# Patient Record
Sex: Male | Born: 1960
Health system: Southern US, Community
[De-identification: ages and names within clinical notes are randomized; demographics above are authoritative.]

## PROBLEM LIST (undated history)

## (undated) DIAGNOSIS — F329 Major depressive disorder, single episode, unspecified: Secondary | ICD-10-CM

## (undated) DIAGNOSIS — K559 Vascular disorder of intestine, unspecified: Secondary | ICD-10-CM

## (undated) DIAGNOSIS — F32A Depression, unspecified: Secondary | ICD-10-CM

## (undated) DIAGNOSIS — R000 Tachycardia, unspecified: Secondary | ICD-10-CM

## (undated) DIAGNOSIS — I10 Essential (primary) hypertension: Secondary | ICD-10-CM

## (undated) DIAGNOSIS — E785 Hyperlipidemia, unspecified: Secondary | ICD-10-CM

## (undated) DIAGNOSIS — K219 Gastro-esophageal reflux disease without esophagitis: Secondary | ICD-10-CM

## (undated) DIAGNOSIS — E119 Type 2 diabetes mellitus without complications: Secondary | ICD-10-CM

## (undated) HISTORY — DX: Tachycardia, unspecified: R00.0

## (undated) HISTORY — DX: Vascular disorder of intestine, unspecified: K55.9

## (undated) HISTORY — PX: NASAL SEPTUM SURGERY: SHX37

## (undated) HISTORY — DX: Hyperlipidemia, unspecified: E78.5

## (undated) HISTORY — DX: Depression, unspecified: F32.A

## (undated) HISTORY — DX: Type 2 diabetes mellitus without complications: E11.9

## (undated) HISTORY — DX: Essential (primary) hypertension: I10

## (undated) HISTORY — DX: Major depressive disorder, single episode, unspecified: F32.9

---

## 1992-07-31 DIAGNOSIS — E1142 Type 2 diabetes mellitus with diabetic polyneuropathy: Secondary | ICD-10-CM | POA: Insufficient documentation

## 1992-07-31 DIAGNOSIS — E119 Type 2 diabetes mellitus without complications: Secondary | ICD-10-CM | POA: Insufficient documentation

## 2000-01-11 ENCOUNTER — Emergency Department (HOSPITAL_COMMUNITY): Admission: EM | Admit: 2000-01-11 | Discharge: 2000-01-12 | Payer: Self-pay | Admitting: *Deleted

## 2002-02-07 ENCOUNTER — Encounter: Admission: RE | Admit: 2002-02-07 | Discharge: 2002-05-08 | Payer: Self-pay | Admitting: Family Medicine

## 2004-02-05 ENCOUNTER — Encounter: Admission: RE | Admit: 2004-02-05 | Discharge: 2004-05-05 | Payer: Self-pay | Admitting: Family Medicine

## 2004-11-18 ENCOUNTER — Ambulatory Visit (HOSPITAL_COMMUNITY): Admission: RE | Admit: 2004-11-18 | Discharge: 2004-11-18 | Payer: Self-pay | Admitting: Urology

## 2004-11-18 ENCOUNTER — Ambulatory Visit (HOSPITAL_BASED_OUTPATIENT_CLINIC_OR_DEPARTMENT_OTHER): Admission: RE | Admit: 2004-11-18 | Discharge: 2004-11-18 | Payer: Self-pay | Admitting: Urology

## 2008-11-27 ENCOUNTER — Ambulatory Visit: Payer: Self-pay | Admitting: Oncology

## 2011-05-08 NOTE — Op Note (Signed)
NAME:  Jack Wallace, Jack Wallace NO.:  [id number]   MEDICAL RECORD NO.:  [id number]          PATIENT TYPE:  AMB   LOCATION:  [facility]                         FACILITY:  [facility]   PHYSICIAN:  Jamison Neighbor, M.D.  DATE OF BIRTH:  1961/06/07   DATE OF PROCEDURE:  11/10/2004  DATE OF DISCHARGE:                                 OPERATIVE REPORT   PREOPERATIVE DIAGNOSIS:  Right ureteral calculus.   POSTOPERATIVE DIAGNOSES:  1.  Passed ureteral calculus.  2.  Large right pelvic calcification outside ureter.   PROCEDURE:  1.  Cystoscopy.  2.  Right ureteroscopy.   SURGEON:  Jamison Neighbor, M.D.   ANESTHESIA:  General.   COMPLICATIONS:  None.   DRAINS:  None.   BRIEF HISTORY:  This 50 year old male developed right-sided pain and was  seen at [facility].  CT scan was obtained which showed what was described as  a 3 mm stone in the mid ureter along with some hydronephrosis.  The patient  presented to my office, and a KUB did not demonstrate a 3 mm stone but did  demonstrate what appeared to be a 15 mm calcification in the general area of  the right distal ureter.  The patient had less pain at that time but since  he had clearly not passed the stone, we felt it was appropriate to give him  a chance to do so.  The patient was given Uroxatral to dilate the ureter.  He returned in follow up and had not yet passed the stone.  He elected to go  ahead and undergo ureteroscopy to determine if the stone was still present.  The patient understands the risks and benefits of the procedure.  He is  aware of the discrepancy between the CT scan and the KUB and knows that this  calcification in the distal right ureter may or may not be within the ureter  proper.  He gave full informed consent.   DESCRIPTION OF PROCEDURE:  After successful induction of general anesthesia,  the patient was placed in the dorsal lithotomy position, prepped with  Betadine and draped in the usual sterile fashion.   Cystoscopy was performed;  the urethra was visualized in its entirety and was found to be normal.  Beyond the verumontanum, the patient had very little prostatic enlargement.  The bladder neck was wide open.  The bladder was then carefully inspected.  It was free of any tumor or stones.  Both ureteral orifices were normal in  configuration and location.  A guidewire was passed up to the right kidney,  and this was visualized by fluoroscopy.  A ureteroscope was then advanced  along the ureter and was advanced approximately two-thirds of the way to the  kidney.  This was certainly well beyond the level where the stone was seen  on a CT scan, and no stone was ever visualized.  The distal ureter was  easily seen and inspected, and no stone could be seen.  Fluoroscopy  demonstrated a large calcification in the pelvis adjacent to the ureter and  clearly separate from the  ureter, indicating that the calcification seen on  KUB was not a stone but simply a phlebolith or other pelvic calcification.  The ureteroscopy was very atraumatic, and it was  felt that there was no need to place a stent.  The guidewire was removed.  The bladder was drained.  The patient tolerated the procedure well and was  taken to the recovery room in good condition.  He will be sent home with  Lorcet Plus as well as __________ and will return to see [place] in follow up.      RJE/MEDQ  D:  11/18/2004  T:  11/18/2004  Job:  [id number]   cc:   [facility]

## 2013-07-31 ENCOUNTER — Other Ambulatory Visit: Payer: Self-pay | Admitting: *Deleted

## 2013-07-31 ENCOUNTER — Encounter: Payer: Self-pay | Admitting: Endocrinology

## 2013-07-31 ENCOUNTER — Ambulatory Visit (INDEPENDENT_AMBULATORY_CARE_PROVIDER_SITE_OTHER): Payer: BC Managed Care – PPO | Admitting: Endocrinology

## 2013-07-31 VITALS — BP 118/80 | HR 93 | Temp 98.8°F | Resp 12 | Ht 73.0 in | Wt 296.4 lb

## 2013-07-31 DIAGNOSIS — I152 Hypertension secondary to endocrine disorders: Secondary | ICD-10-CM | POA: Insufficient documentation

## 2013-07-31 DIAGNOSIS — E1149 Type 2 diabetes mellitus with other diabetic neurological complication: Secondary | ICD-10-CM

## 2013-07-31 DIAGNOSIS — E7800 Pure hypercholesterolemia, unspecified: Secondary | ICD-10-CM

## 2013-07-31 DIAGNOSIS — E785 Hyperlipidemia, unspecified: Secondary | ICD-10-CM | POA: Insufficient documentation

## 2013-07-31 DIAGNOSIS — IMO0001 Reserved for inherently not codable concepts without codable children: Secondary | ICD-10-CM

## 2013-07-31 DIAGNOSIS — E1140 Type 2 diabetes mellitus with diabetic neuropathy, unspecified: Secondary | ICD-10-CM

## 2013-07-31 DIAGNOSIS — E1159 Type 2 diabetes mellitus with other circulatory complications: Secondary | ICD-10-CM | POA: Insufficient documentation

## 2013-07-31 DIAGNOSIS — E1142 Type 2 diabetes mellitus with diabetic polyneuropathy: Secondary | ICD-10-CM

## 2013-07-31 DIAGNOSIS — I10 Essential (primary) hypertension: Secondary | ICD-10-CM

## 2013-07-31 MED ORDER — GABAPENTIN 300 MG PO CAPS: 300.0000 mg | ORAL_CAPSULE | ORAL | Status: DC | PRN

## 2013-07-31 MED ORDER — CANAGLIFLOZIN 100 MG PO TABS: 100.0000 mg | ORAL_TABLET | Freq: Every day | ORAL | Status: DC

## 2013-07-31 NOTE — Progress Notes (Signed)
Patient ID: Jack Wallace, male   DOB: 03-16-1961, 52 y.o.   MRN: [id number]    Reason for Appointment : Consultation for Type 2 Diabetes  History of Present Illness          Diagnosis: Type 2 diabetes mellitus, date of diagnosis: 1993        Past history: He has been treated with various drugs for his diabetes over the last several years. He has been taking metformin for at least 10 years and has been tolerating this. Over the years he has had additional medications to improve his control. He was also taking Actos at some point and not clear if he had any side effects. This was stopped because of fear of long-term effects The last 5 years he has been on glipizide also. Previously was taking 10 mg twice a day. He thinks that a couple of years ago his blood sugars were poorly controlled with readings up to 300  Recent history: About 6 months ago he started walking with more lost 15 pounds. Subsequently about 2-4 months ago he has had periods of low blood sugar during the day especially before lunch and sometimes in the afternoon His glipizide has been reduced to 5 mg twice a day but he still has had low blood sugar episodes  More recently his glipizide was changed to half tablet at breakfast and lunch and one tablet at supper  His low blood sugar episodes have been less recently but he is not referred him for further management  The diet that the patient has been following [id number] to limit carbohydrates. Meals: 3 meals per day.  usually eating a frozen meal at breakfast and supper and only crackers at lunch. Will have more snacks and evenings including chips and cheese crackers        Dietician visit: Most recent:1993-95   Monitors blood glucose: Twice a day.         Glucometer: ? One Touch.      Blood Glucose readings from meter download: readings before breakfast: 130-140, nonfasting  100-150    Hypoglycemia frequency:  recently minimal but previously was having more frequent symptoms as  above.  significant hypoglycemia he has symptoms of feeling lightheaded, having tunnel vision and feeling weak. He gets excessively hungry with low sugars           Physical activity: exercise:  walking to work and do shopping areas             His last A1c was 6.9% done on 07/18/13  No results found for this basename: HGBA1C   Filed Weights   07/31/13 1338  Weight: 296 lb 6.4 oz (134.446 kg)    Retinal exam: Most recent:. 7/13    Medication List       This list is accurate as of: 07/31/13  1:55 PM.  Always use your most recent med list.               glipiZIDE 5 MG tablet  Commonly known as:  GLUCOTROL  Take 5 mg by mouth 2 (two) times daily before a meal.     lisinopril-hydrochlorothiazide 20-25 MG per tablet  Commonly known as:  PRINZIDE,ZESTORETIC  Take 1 tablet by mouth daily.     metFORMIN 500 MG tablet  Commonly known as:  GLUCOPHAGE  Take 500 mg by mouth 2 (two) times daily with a meal.     pravastatin 40 MG tablet  Commonly known as:  PRAVACHOL  Take 40  mg by mouth daily.        Allergies: No Known Allergies  Past Medical History  Diagnosis Date  . Diabetes mellitus without complication   . Hypertension   . Hyperlipidemia     Past Surgical History  Procedure Laterality Date  . Nasal septum surgery      Family History  Problem Relation Age of Onset  . Hypertension Mother   . Diabetes Maternal Grandfather     Social History:  reports that he has never smoked. He has never used smokeless tobacco. His alcohol and drug histories are not on file.    Review of Systems      Hyperlipidemia: Currently taking pravastatin, no recent labs available     Skin: No rash or infections     Thyroid:  No cold or heat intolerance, unusual fatigue.     The blood pressure has been controlled with Zestoretic. He thinks that his readings at home may be as low as 80/60 at times but other times about 120/70. Only minimal symptoms of lightheadedness     No  swelling of feet.     No shortness of breath on exertion.     Bowel habits:  Has had constipation at times . No abnormal pain      No frequency of urination, nocturia.      No joint  Pains.      He complains of his right foot burning for the last 2 years and this is occurring at various times including while working and with certain shoes, only mild transient at night. Has not been treated for this      No  depression or anxiety.    Physical Examination:  BP 118/80  Pulse 93  Temp(Src) 98.8 F (37.1 C)  Resp 12  Ht 6\' 1"  (1.854 m)  Wt 296 lb 6.4 oz (134.446 kg)  BMI 39.11 kg/m2  SpO2 98%  GENERAL:         Patient appears to have generalized obesity.   HEENT:         Eye exam shows normal external appearance. Fundus exam shows no retinopathy. Oral exam shows normal mucosa .  NECK:         General:  Neck exam shows no lymphadenopathy. Carotids are normal to palpation and no bruit heard. Thyroid is not enlarged and no nodules felt.   LUNGS:         Chest is symmetrical. Lungs are clear to auscultation.Marland Kitchen   HEART:         Heart sounds:  S1 and S2 are normal. No murmurs or clicks heard., no S3 or S4.   ABDOMEN:         General:  There is no distention present. Liver and spleen are not palpable. No other mass or tenderness present.  EXTREMITIES:     There is no edema. No skin lesions present.Marland Kitchen  NEUROLOGICAL:        Vibration sense is markedly reduced in toes bilaterally. Ankle jerks are absent bilaterally.         Diabetic foot exam:            Inspection  Normal                    Monofilament  Normal  MUSCULOSKELETAL:       There is no enlargement or deformity of the joints. Spine is normal to inspection.Marland Kitchen   PEDAL pulses: Normal SKIN:  No rash or lesions of concern.        ASSESSMENT/PLAN:   Diabetes type 2, uncontrolled - 250.02    His blood sugars are overall reasonably well controlled but he is having a tendency to hypoglycemia with glipizide This is likely to be  because of is increasing his activity level and losing weight earlier this year Currently is having difficulty with losing weight and not able to control portions and snacks He is also taking metformin 2000 mg a day and tolerating this well, no contraindications for him taking this.  Discussed options for alternative treatment especially to benefit his obesity. Explained that we could either try a GLP-1 drug or SGLT.-2 drug and explained how these work. He is reluctant to try injectable drug and prefers to try an oral medication He will start with 100 mg of Invokana and taper off his glipizide, instructions given Discussed how Invokana works, medication dosage, possible side effects and efficacy He will also be seen by a diabetes nurse educator and dietitian for further education  Complications: He appears to have symptoms of neuropathy and would like to have her medication to control symptoms we will try him on gabapentin 3 times a day as needed  HYPERLIPIDEMIA: Recent labs are available and this will need to be reassessed, currently on pravastatin  Hilman Kissling 07/31/2013, 1:55 PM

## 2013-07-31 NOTE — Patient Instructions (Addendum)
Please check blood sugars at least half the time about 2 hours after any meal and as directed on waking up. Please bring blood sugar monitor to each visit  Stop morning glipizide and take only half a tablet before dinner for the first 5 days on the new medication  Start INVOKANA 100 mg daily in the morning. CONTINUE metformin 2 tablets twice a day  Reduce lisinopril HCT to half tablet  Gabapentin 300 mg as needed up to 3 times a day for burning in the feet, preferably take this with food, may cause nausea or dizziness  Continue walking as much as possible and watch portions and fat intake

## 2013-08-01 DIAGNOSIS — E1140 Type 2 diabetes mellitus with diabetic neuropathy, unspecified: Secondary | ICD-10-CM | POA: Insufficient documentation

## 2013-08-25 ENCOUNTER — Encounter: Payer: Self-pay | Admitting: Endocrinology

## 2013-08-25 ENCOUNTER — Ambulatory Visit (INDEPENDENT_AMBULATORY_CARE_PROVIDER_SITE_OTHER): Payer: BC Managed Care – PPO | Admitting: Endocrinology

## 2013-08-25 VITALS — BP 104/70 | HR 83 | Temp 98.3°F | Resp 12 | Ht 73.0 in | Wt 295.3 lb

## 2013-08-25 DIAGNOSIS — E1149 Type 2 diabetes mellitus with other diabetic neurological complication: Secondary | ICD-10-CM

## 2013-08-25 DIAGNOSIS — IMO0001 Reserved for inherently not codable concepts without codable children: Secondary | ICD-10-CM

## 2013-08-25 DIAGNOSIS — E1140 Type 2 diabetes mellitus with diabetic neuropathy, unspecified: Secondary | ICD-10-CM

## 2013-08-25 DIAGNOSIS — E1142 Type 2 diabetes mellitus with diabetic polyneuropathy: Secondary | ICD-10-CM

## 2013-08-25 DIAGNOSIS — I10 Essential (primary) hypertension: Secondary | ICD-10-CM

## 2013-08-25 MED ORDER — GABAPENTIN 100 MG PO CAPS: 100.0000 mg | ORAL_CAPSULE | Freq: Three times a day (TID) | ORAL | Status: DC

## 2013-08-25 NOTE — Progress Notes (Signed)
Patient ID: Jack Wallace, male   DOB: Feb 03, 1961, 52 y.o.   MRN: [id number]    Reason for Appointment : Consultation for Type 2 Diabetes  History of Present Illness          Diagnosis: Type 2 diabetes mellitus, date of diagnosis: 1993        Past history: He has been treated with various drugs for his diabetes over the last several years. He has been taking metformin for at least 10 years and has been tolerating this. Over the years he has had additional medications to improve his control. He was also taking Actos at some point and not clear if he had any side effects. This was stopped because of fear of long-term effects The last 5 years he has been on glipizide also. Previously was taking 10 mg twice a day. He thinks that a couple of years ago his blood sugars were poorly controlled with readings up to 300 About 6 months ago he started walking with more lost 15 pounds. Subsequently about 2-4 months ago he has had periods of low blood sugar during the day especially before lunch and sometimes in the afternoon His glipizide was been reduced to 5 mg twice a day but he still  had low blood sugar episodes   Recent history:  He was told to switch to Invokana instead of a glipizide on his initial consultation but he started getting constipation about a week into the medication and he stopped the Invokana on his own and did not notify us. His blood sugars have been checked very sporadically and has variable readings. No readings after lunch or supper  The diet that the patient has been following QI:ONGEX to limit carbohydrates. Meals: 3 meals per day.  usually eating a frozen meal at breakfast and supper and only crackers at lunch. Will have more snacks and evenings including chips and cheese crackers        Dietician visit: Most recent:1993-95   Monitors blood glucose:  sporadically        Glucometer:  FreeStyle     Blood Glucose readings from meter download: Early morning 168, around 10 AM 73,  189-136, 1 PM = 89   Hypoglycemia frequency:  recently minimal           Physical activity: exercise:  walking to work and do shopping areas             His last A1c was 6.9% done on 07/18/13 and 7.3 on 08/17/13  No results found for this basename: HGBA1C   Filed Weights   08/25/13 1526  Weight: 295 lb 4.8 oz (133.947 kg)    Retinal exam: Most recent:. 7/13    Medication List       This list is accurate as of: 08/25/13  3:33 PM.  Always use your most recent med list.               Canagliflozin 100 MG Tabs  Commonly known as:  INVOKANA  Take 1 tablet (100 mg total) by mouth daily.     gabapentin 300 MG capsule  Commonly known as:  NEURONTIN  Take 1 capsule (300 mg total) by mouth as needed.     glipiZIDE 5 MG tablet  Commonly known as:  GLUCOTROL  Take 5 mg by mouth 2 (two) times daily before a meal.     lisinopril-hydrochlorothiazide 20-25 MG per tablet  Commonly known as:  PRINZIDE,ZESTORETIC  Take 1 tablet by mouth daily.  metFORMIN 500 MG tablet  Commonly known as:  GLUCOPHAGE  Take 500 mg by mouth 2 (two) times daily with a meal.     pravastatin 40 MG tablet  Commonly known as:  PRAVACHOL  Take 40 mg by mouth daily.        Allergies: No Known Allergies  Past Medical History  Diagnosis Date  . Diabetes mellitus without complication   . Hypertension   . Hyperlipidemia     Past Surgical History  Procedure Laterality Date  . Nasal septum surgery      Family History  Problem Relation Age of Onset  . Hypertension Mother   . Diabetes Maternal Grandfather     Social History:  reports that he has never smoked. He has never used smokeless tobacco. His alcohol and drug histories are not on file.    Review of Systems      Hyperlipidemia: Currently taking pravastatin     The blood pressure has been controlled with Zestoretic. He thinks that his readings at home may be as low as 80/60 at times but other times about 120/70. Only minimal symptoms  of lightheadedness      He complains of his right foot burning for the last 2 years and this is occurring at various times including while working and with certain shoes, only mild transient at night. Has not been treated for this    Physical Examination:  BP 104/70  Pulse 83  Temp(Src) 98.3 F (36.8 C)  Resp 12  Ht 6\' 1"  (1.854 m)  Wt 295 lb 4.8 oz (133.947 kg)  BMI 38.97 kg/m2  SpO2 97%  GENERAL:         Patient appears to have generalized obesity.    ASSESSMENT/PLAN:   Diabetes type 2, uncontrolled - 250.02    He has had difficulty taking glipizide because of tendency to hypoglycemia Although he did try his description of Invokana he stopped this because of having constipation Discussed that this is not a usual side effect of Invokana and he is willing to try this again. We'll stop glipizide since recently has had low normal blood sugars  Currently is having difficulty with losing weight and not able to control portions and snacks He is also taking metformin 2000 mg a day and tolerating this well, no contraindications for him taking this.  He will also be seen by a diabetes nurse educator and dietitian for further education  Complications: He appears to have symptoms of neuropathy and would like to have her medication to control symptoms we will try him on gabapentin 3 times a day as needed  HYPERLIPIDEMIA: Recent labs are not available and this will need to be reassessed, currently on pravastatin  Jack Wallace 08/25/2013, 3:33 PM   Addendum: Labs on 08/17/13 done at work. Lipids show LDL 77 with triglycerides 409 and HDL 38

## 2013-08-25 NOTE — Patient Instructions (Addendum)
Please check blood sugars at least half the time about 2 hours after any meal and as directed on waking up. Please bring blood sugar monitor to each visit  Stop Glipizide  Stay on Metformin  Gabapentin 100mg  2x daily and 300 mg bedtime  Invokana in am next week onwards

## 2013-09-22 ENCOUNTER — Other Ambulatory Visit: Payer: Self-pay | Admitting: *Deleted

## 2013-09-22 ENCOUNTER — Encounter: Payer: Self-pay | Admitting: Endocrinology

## 2013-09-22 ENCOUNTER — Other Ambulatory Visit: Payer: BC Managed Care – PPO

## 2013-09-22 ENCOUNTER — Ambulatory Visit (INDEPENDENT_AMBULATORY_CARE_PROVIDER_SITE_OTHER): Payer: BC Managed Care – PPO | Admitting: Endocrinology

## 2013-09-22 VITALS — BP 118/60 | HR 104 | Temp 98.5°F | Resp 12 | Ht 73.0 in | Wt 287.9 lb

## 2013-09-22 DIAGNOSIS — E785 Hyperlipidemia, unspecified: Secondary | ICD-10-CM

## 2013-09-22 DIAGNOSIS — IMO0001 Reserved for inherently not codable concepts without codable children: Secondary | ICD-10-CM

## 2013-09-22 MED ORDER — ONETOUCH DELICA LANCETS FINE MISC: 1.0000 | Freq: Two times a day (BID) | Status: DC

## 2013-09-22 MED ORDER — GLUCOSE BLOOD VI STRP: ORAL_STRIP | Status: DC

## 2013-09-22 NOTE — Progress Notes (Signed)
Patient ID: Jack Wallace, male   DOB: Dec 02, 1961, 52 y.o.   MRN: [id number]    Reason for Appointment : Consultation for Type 2 Diabetes  History of Present Illness          Diagnosis: Type 2 diabetes mellitus, date of diagnosis: 1993         Past history: He has been treated with various drugs for his diabetes over the last several years. He has been taking metformin for at least 10 years and has been tolerating this. Over the years he has had additional medications to improve his control. He was also taking Actos at some point and not clear if he had any side effects. This was stopped because of fear of long-term effects The last 5 years he was on glipizide also. Previously was taking 10 mg twice a day. About 2 years ago his blood sugars were poorly controlled with readings up to 300 In early 2014 he started walking and 15 pounds. Subsequently he was getting low blood sugar during the day especially before lunch and sometimes in the afternoon His glipizide was  reduced but even with 5 mg twice a day he was getting hypoglycemia  Recent history:  He has resumed Invokana which he previously tried only for a week and felt he had constipation with this. However he has no such side effects now and taking this along with metformin maximum doses. His blood sugars are difficult to assess as they are inconsistent and high only on 2 separate days No readings after lunch or supper  The diet that the patient has been following WU:[id number] to limit carbohydrates. Meals: 2 meals per day.  usually eating a frozen meal at breakfast and supper and only crackers at lunch. Will have more snacks and evenings including chips and crackers        Dietician visit: Most recent:1993-95   Monitors blood glucose:  sporadically        Glucometer:  FreeStyle     Blood Glucose readings from meter download: Early morning 242, 180, midday 112-228, has no readings after 1 PM and most readings late morning. Overall averaging 160   Hypoglycemia frequency:  recently none           Physical activity: exercise: No specific exercise, just walking to work and to the shopping areas             His last A1c was 6.9% done on 07/18/13 and 7.3 on 08/17/13, done from outside lab  No results found for this basename: HGBA1C   Filed Weights   09/22/13 1558  Weight: 287 lb 14.4 oz (130.591 kg)    Retinal exam: Most recent:. 7/13  HYPERLIPIDEMIA: He is currently taking pravastatin only with good control of LDL but persistently high triglycerides and low HDL.Lipids show LDL 77 with triglycerides 147 and HDL 38 in August    Medication List       This list is accurate as of: 09/22/13 11:59 PM.  Always use your most recent med list.               Canagliflozin 100 MG Tabs  Commonly known as:  INVOKANA  Take 1 tablet (100 mg total) by mouth daily.     gabapentin 100 MG capsule  Commonly known as:  NEURONTIN  Take 1 capsule (100 mg total) by mouth 3 (three) times daily.     glipiZIDE 5 MG tablet  Commonly known as:  GLUCOTROL  Take 5 mg by  mouth 2 (two) times daily before a meal.     glucose blood test strip  Commonly known as:  ONETOUCH VERIO  Use as instructed to check blood sugars 2 times a day     lisinopril-hydrochlorothiazide 20-25 MG per tablet  Commonly known as:  PRINZIDE,ZESTORETIC  Take 1 tablet by mouth daily. 1/2     metFORMIN 500 MG tablet  Commonly known as:  GLUCOPHAGE  Take 500 mg by mouth 2 (two) times daily with a meal. 2 tabs twice dialy     ONETOUCH DELICA LANCETS FINE Misc  1 each by Does not apply route 2 (two) times daily.     pravastatin 40 MG tablet  Commonly known as:  PRAVACHOL  Take 40 mg by mouth daily.        Allergies: No Known Allergies  Past Medical History  Diagnosis Date  . Diabetes mellitus without complication   . Hypertension   . Hyperlipidemia     Past Surgical History  Procedure Laterality Date  . Nasal septum surgery      Family History  Problem  Relation Age of Onset  . Hypertension Mother   . Diabetes Maternal Grandfather     Social History:  reports that he has never smoked. He has never used smokeless tobacco. His alcohol and drug histories are not on file.    Review of Systems      Hyperlipidemia: Currently taking pravastatin only for several years     The blood pressure has been controlled with Zestoretic. He thinks that his readings at home are about 130. His respiratory was reduced to half tablet with starting Invokana on the last visit      He complains of his right foot burning for the last 2 years and this is occurring at various times including while working and with certain shoes, only mild transient at night. Has not been treated for this   Physical Examination:  BP 118/60  Pulse 104  Temp(Src) 98.5 F (36.9 C)  Resp 12  Ht 6\' 1"  (1.854 m)  Wt 287 lb 14.4 oz (130.591 kg)  BMI 37.99 kg/m2  SpO2 96%  GENERAL:         Patient appears to have generalized obesity.   No ankle edema  ASSESSMENT/PLAN:   Diabetes type 2, uncontrolled - 250.02    He has had difficulty taking glipizide because of tendency to hypoglycemia and blood sugars were high on 2000 mg metformin alone. Is back on Invokana since his last visit and difficult his control as his readings are sporadically higher and not sure if his meter is accurate. Since some of his readings are fairly good will try to increase Invokana and not add  glipizide as yet. He needs to check more readings after supper and consider adding half tablet glipizide before supper if these are high Will check his fructosamine and A1c on next visit to help assess improvement Again is having difficulty with losing weight but is trying to control portions He will also be seen by a diabetes nurse educator and dietitian for further education Today he was shown how to use the Verio glucose monitor as his current monitor is inconsistent and the steps will be better covered by his  insurance  Complications: He has neuropathy and he will continue 100 mg gabapentin 3 times a day as needed which is helping except occasionally when he is driving, may take extra 952 mg as needed in addition since he has no side effects  now  HYPERLIPIDEMIA: Lipids show LDL 77 with triglycerides 161 and HDL 38, currently on pravastatin. Will add fenofibrate and reassess fasting lipids on the next visit. Discussed importance of weight loss and exercise  HYPERTENSION: His blood pressure is relatively low even with taking half a tablet of Zestoretic. He will change this to lisinopril 10 mg from the next prescription, he will call when he is running out  [place] 09/24/2013, 7:06 PM

## 2013-09-22 NOTE — Patient Instructions (Addendum)
Please check blood sugars at least half the time about 2 hours after any meal and as directed on waking up.   If sugars higher after supper add 1/2 Glipizide before supper Take Invokana 2 BEFORE breakfast and next Rx will be 300mg   Please bring blood sugar monitor to each visit  Fenofibrate 145 mg daily in pms  May take 2 Gabapentin if needed

## 2013-10-04 ENCOUNTER — Telehealth: Payer: Self-pay | Admitting: *Deleted

## 2013-10-04 NOTE — Telephone Encounter (Signed)
He will need to be started on Victoza injections, please schedule appointment with nurse educator ASAP, in the meantime he can take double the dose of glipizide

## 2013-10-04 NOTE — Telephone Encounter (Signed)
At last visit you put the patient on Invokana, Pt says his sugars are up around 250 after taking it.   He says he's been taking the Glipizide and metformin because he says the Invokana isn't helping. He also says he's been skipping lunch because he's afraid his sugars will go high. Please advise. [phone]

## 2013-10-04 NOTE — Telephone Encounter (Signed)
Needs OV.  

## 2013-10-04 NOTE — Telephone Encounter (Signed)
Left message for patient and at the [facility] center.

## 2013-10-04 NOTE — Telephone Encounter (Signed)
I spoke with Damita and Harriett Sine at the [facility], they both said you sent a referral for weight loss and Education and they tried calling him on several occasions, and he would never return their calls, they said if you just want him to do a Victoza start we could do that here in the office, but if he wants to go to them for services they will also show him how to do the Victoza

## 2013-10-06 ENCOUNTER — Ambulatory Visit (INDEPENDENT_AMBULATORY_CARE_PROVIDER_SITE_OTHER): Payer: BC Managed Care – PPO | Admitting: Endocrinology

## 2013-10-06 ENCOUNTER — Encounter: Payer: Self-pay | Admitting: Endocrinology

## 2013-10-06 ENCOUNTER — Other Ambulatory Visit: Payer: Self-pay | Admitting: *Deleted

## 2013-10-06 VITALS — BP 118/78 | HR 100 | Temp 98.3°F | Resp 12 | Ht 73.0 in | Wt 288.7 lb

## 2013-10-06 DIAGNOSIS — I10 Essential (primary) hypertension: Secondary | ICD-10-CM

## 2013-10-06 DIAGNOSIS — IMO0001 Reserved for inherently not codable concepts without codable children: Secondary | ICD-10-CM

## 2013-10-06 MED ORDER — LIRAGLUTIDE 18 MG/3ML ~~LOC~~ SOPN: 1.2000 mg | PEN_INJECTOR | Freq: Every day | SUBCUTANEOUS | Status: DC

## 2013-10-06 MED ORDER — INSULIN PEN NEEDLE 32G X 5 MM MISC: 1.0000 | Freq: Every day | Status: DC

## 2013-10-06 MED ORDER — GLIPIZIDE 5 MG PO TABS: ORAL_TABLET | ORAL | Status: DC

## 2013-10-06 NOTE — Progress Notes (Signed)
Patient ID: Jack Wallace, male   DOB: Feb 02, 1961, 52 y.o.   MRN: [id number]   Reason for Appointment : Consultation for Type 2 Diabetes  History of Present Illness          Diagnosis: Type 2 diabetes mellitus, date of diagnosis: 1993         Past history: He has been treated with various drugs for his diabetes over the last several years. He has been taking metformin for at least 10 years and has been tolerating this. Over the years he has had additional medications to improve his control. He was also taking Actos at some point and not clear if he had any side effects. This was stopped because of fear of long-term effects The last 5 years he was on glipizide also. Previously was taking 10 mg twice a day. About 2 years ago his blood sugars were poorly controlled with readings up to 300 In early 2014 he started walking and 15 pounds. Subsequently he was getting low blood sugar during the day especially before lunch and sometimes in the afternoon His glipizide was  reduced but even with 5 mg twice a day he was getting hypoglycemia On his last visit he was started back on Invokana and had tolerated this well.  Recent history:  He has had higher blood sugars at home despite taking Invokana and metformin. On his own he started back taking glipizide half tablet twice a day before meals but is still concerned about his blood sugars being high especially in the morning He did have an episode of hypoglycemia last night after supper but had not eaten much during the day and worked longer hours His blood sugars are being checked mostly midday and afternoon Does not appear to be having high readings in the afternoon/ evenings after about 3 PM  The diet that the patient has been following WU:[id number] to limit carbohydrates. Meals: 2 meals per day.  usually eating a frozen meal at breakfast 9 am and supper 6 pm and only crackers at lunch. Will have more snacks and evenings including chips and crackers         Dietician visit: Most recent:1993-95   Monitors blood glucose:  sporadically        Glucometer:  FreeStyle     Blood Glucose readings from meter download: 6 AM = 158, 172, midmorning up to 245, lunch 149-258 and afternoon/evening 54-162 Hypoglycemia frequency:  recently one time           Physical activity: exercise: No specific exercise, just walking to work and to the shopping areas             His last A1c was 6.9% done on 07/18/13 and 7.3 on 08/17/13, done from outside lab  No results found for this basename: HGBA1C   Wt Readings from Last 3 Encounters:  10/06/13 288 lb 11.2 oz (130.953 kg)  09/22/13 287 lb 14.4 oz (130.591 kg)  08/25/13 295 lb 4.8 oz (133.947 kg)    Retinal exam: Most recent:. 7/13    Medication List       This list is accurate as of: 10/06/13  4:01 PM.  Always use your most recent med list.               Canagliflozin 100 MG Tabs  Commonly known as:  INVOKANA  Take 1 tablet (100 mg total) by mouth daily.     gabapentin 100 MG capsule  Commonly known as:  NEURONTIN  Take  1 capsule (100 mg total) by mouth 3 (three) times daily.     glipiZIDE 5 MG tablet  Commonly known as:  GLUCOTROL  Take 5 mg by mouth 2 (two) times daily before a meal.     glucose blood test strip  Commonly known as:  ONETOUCH VERIO  Use as instructed to check blood sugars 2 times a day     lisinopril-hydrochlorothiazide 20-25 MG per tablet  Commonly known as:  PRINZIDE,ZESTORETIC  Take 1 tablet by mouth daily. 1/2     metFORMIN 500 MG tablet  Commonly known as:  GLUCOPHAGE  Take 500 mg by mouth 2 (two) times daily with a meal. 2 tabs twice dialy     ONETOUCH DELICA LANCETS FINE Misc  1 each by Does not apply route 2 (two) times daily.     pravastatin 40 MG tablet  Commonly known as:  PRAVACHOL  Take 40 mg by mouth daily.        Allergies: No Known Allergies  Past Medical History  Diagnosis Date  . Diabetes mellitus without complication   . Hypertension   .  Hyperlipidemia     Past Surgical History  Procedure Laterality Date  . Nasal septum surgery      Family History  Problem Relation Age of Onset  . Hypertension Mother   . Diabetes Maternal Grandfather     Social History:  reports that he has never smoked. He has never used smokeless tobacco. His alcohol and drug histories are not on file.    Review of Systems      Hyperlipidemia: Currently taking pravastatin only for several years .Lipids show LDL 77 with triglycerides 161 and HDL 38 in August      The blood pressure has been controlled with Zestoretic. He thinks that his readings at home are about 130. His lisinopril HCT was reduced to half tablet with starting Invokana   Had complained of right foot burning and was given gabapentin on the last visit  Physical Examination:  BP 118/78  Pulse 100  Temp(Src) 98.3 F (36.8 C)  Resp 12  Ht 6\' 1"  (1.854 m)  Wt 288 lb 11.2 oz (130.953 kg)  BMI 38.1 kg/m2  SpO2 96%  GENERAL:         Patient appears to have generalized obesity.   No ankle edema  ASSESSMENT/PLAN:   Diabetes type 2, uncontrolled - 250.02    He has had difficulty controlling his blood sugars currently with a 3 drug regimen. Also has tendency to occasional hypoglycemia with glipizide. Also his blood sugars appear to be generally higher in the morning. Has not really lost any significant amount of weight even with using Invokana and his BMI is 38  He should benefit from a GLP-1 drug and discussed how these work. Discussed actions of Victoza, effects on various aspects of diabetes management including decreased fasting glucose, increased satiety and gastric fullness as well as long-term benefits and weight loss and blood sugar control. He was demonstrated how to use a Victoza pen in the office and discussed doses titration, side effects and 50 He will stop the glipizide in the evening and may be able to stop the morning dose also Encourage more  exercise  HYPERTENSION: Well controlled but may need reduction of medication if he loses weight, advised him to monitor this periodically   Samayah Novinger 10/06/2013, 4:01 PM

## 2013-10-06 NOTE — Patient Instructions (Addendum)
Start VICTOZA injection with the sample pen once daily at the same time of the day.  Dial the dose to 0.6 mg for the first week.  You may  experience nausea in the first few days which usually gets better the After 1 week increase the dose to 1.2mg  daily if no nausea.  You may inject in the stomach, thigh or arm.   You will feel fullness of the stomach with starting the medication and should try to keep portions of food small.    Please check blood sugars at least half the time about 2 hours after any meal and every other day on waking up. Please bring blood sugar monitor to each visit  Stop glipizide at suppertime  Continue half glipizide before breakfast but if your sugars are below 100 between breakfast and supper may stop this also  Continue being active and walk 20-30 minutes at least every other day

## 2013-10-26 ENCOUNTER — Other Ambulatory Visit: Payer: Self-pay

## 2013-11-06 ENCOUNTER — Encounter: Payer: Self-pay | Admitting: Endocrinology

## 2013-11-06 ENCOUNTER — Ambulatory Visit (INDEPENDENT_AMBULATORY_CARE_PROVIDER_SITE_OTHER): Payer: BC Managed Care – PPO | Admitting: Endocrinology

## 2013-11-06 VITALS — BP 118/72 | HR 81 | Temp 98.6°F | Resp 12 | Ht 73.0 in | Wt 283.8 lb

## 2013-11-06 DIAGNOSIS — I10 Essential (primary) hypertension: Secondary | ICD-10-CM

## 2013-11-06 DIAGNOSIS — IMO0001 Reserved for inherently not codable concepts without codable children: Secondary | ICD-10-CM

## 2013-11-06 NOTE — Progress Notes (Signed)
Patient ID: Jack Wallace, male   DOB: 12/03/1961, 52 y.o.   MRN: [id number]   Reason for Appointment : Followup of Type 2 Diabetes  History of Present Illness          Diagnosis: Type 2 diabetes mellitus, date of diagnosis: 1993         Past history: He has been treated with various drugs for his diabetes over the last several years. He has been taking metformin for at least 10 years and has been tolerating this. Over the years he has had additional medications to improve his control. He was also taking Actos at some point and not clear if he had any side effects. This was stopped because of fear of long-term effects The last 5 years he was on glipizide also. Previously was taking 10 mg twice a day. About 2 years ago his blood sugars were poorly controlled with readings up to 300 In early 2014 he started walking and lost 15 pounds. Subsequently he was getting low blood sugar during the day especially before lunch and sometimes in the afternoon His glipizide was  reduced but even with 5 mg twice a day he was getting hypoglycemia. Was tried again on Invokana  Recent history:  He has had higher blood sugars on his last visit despite taking Invokana and metformin. Also is taking glipizide 2.5 a.c. twice a day. Highest readings were in the morning; also had an episode of hypoglycemia after supper Because of inconsistent control he was started on Victoza on his last visit in 10/14 With this his blood sugars are considerably better and fairly good throughout the day However he has had persistent nausea even during the night and occasional vomiting. Does not know if his nausea was present with 0.6 mg which he took for about 10 days Currently taking glipizide only half tablet before supper and his Invokana was stopped  The diet that the patient has been following WU:[id number] to limit carbohydrates. Meals: 2 meals per day.  usually eating a frozen meal at breakfast 9 am and supper 6 pm and only crackers at  lunch. Will have more snacks and evenings including chips and crackers        Dietician visit: Most recent:1993-95   Monitors blood glucose:  sporadically        Glucometer:  FreeStyle     Blood Glucose readings from meter download: Before breakfast recently 105-139, midday 112-166 and afternoon/evening 77-134 with only a few readings after supper. AVERAGE 126 for 30 days Hypoglycemia frequency:  recently one time at 8 pm  with glucose 77 and some symptoms          Physical activity: exercise: No specific exercise, just walking to work and to the drug store which maybe 45 minutes both ways          His last A1c was  7.1 in 11/14, previously 7.3 on 08/17/13, done from outside lab  No results found for this basename: HGBA1C   Wt Readings from Last 3 Encounters:  11/06/13 283 lb 12.8 oz (128.731 kg)  10/06/13 288 lb 11.2 oz (130.953 kg)  09/22/13 287 lb 14.4 oz (130.591 kg)    Retinal exam: Most recent:. 7/13    Medication List       This list is accurate as of: 11/06/13  1:18 PM.  Always use your most recent med list.               gabapentin 100 MG capsule  Commonly  known as:  NEURONTIN  Take 1 capsule (100 mg total) by mouth 3 (three) times daily.     glipiZIDE 5 MG tablet  Commonly known as:  GLUCOTROL  Take 1/2 tablet daily with breakfast     glucose blood test strip  Commonly known as:  ONETOUCH VERIO  Use as instructed to check blood sugars 2 times a day     Insulin Pen Needle 32G X 5 MM Misc  1 each by Does not apply route daily.     Liraglutide 18 MG/3ML Sopn  Inject 1.2 mg into the skin daily.     lisinopril-hydrochlorothiazide 20-25 MG per tablet  Commonly known as:  PRINZIDE,ZESTORETIC  Take 1 tablet by mouth daily. 1/2     metFORMIN 500 MG tablet  Commonly known as:  GLUCOPHAGE  Take 500 mg by mouth 2 (two) times daily with a meal. 2 tabs twice dialy     ONETOUCH DELICA LANCETS FINE Misc  1 each by Does not apply route 2 (two) times daily.      pravastatin 40 MG tablet  Commonly known as:  PRAVACHOL  Take 40 mg by mouth daily.        Allergies: No Known Allergies  Past Medical History  Diagnosis Date  . Diabetes mellitus without complication   . Hypertension   . Hyperlipidemia     Past Surgical History  Procedure Laterality Date  . Nasal septum surgery      Family History  Problem Relation Age of Onset  . Hypertension Mother   . Diabetes Maternal Grandfather     Social History:  reports that he has never smoked. He has never used smokeless tobacco. His alcohol and drug histories are not on file.    Review of Systems      Hyperlipidemia: Currently taking pravastatin only for several years .Lipids show LDL 77 with triglycerides 454 and HDL 38 in August      The blood pressure has been controlled with Zestoretic. He thinks that his readings at home are about 120/80 once 90/ 70   130. His lisinopril HCT was reduced to half tablet with starting Invokana   Had complained of right foot burning and was given gabapentin on the last visit  Physical Examination:  BP 118/72  Pulse 81  Temp(Src) 98.6 F (37 C)  Resp 12  Ht 6\' 1"  (1.854 m)  Wt 283 lb 12.8 oz (128.731 kg)  BMI 37.45 kg/m2  SpO2 96%  GENERAL:         Patient appears to have generalized obesity.   No ankle edema  ASSESSMENT/PLAN:   Diabetes type 2, uncontrolled - 250.02    He has had  significant improvement in his blood sugar control with adding Victoza and stopping Invokana. He has also taken only low-dose glipizide at supper only Does have mildly increased readings but not consistently in the morning or midday and no hypoglycemia Since he has significant nausea with Victoza will try him back on 0.6 mg until the end of the week If he can tolerate this will try 0.9 mg as shown on the pen today with 5 clicks beyond the 0.6 dose Alternatively can have him try Tanzeum, he will call if he needs to switch, given starter kit today  HYPERTENSION:  Well controlled     Jack Wallace 11/06/2013, 1:18 PM

## 2013-11-06 NOTE — Patient Instructions (Signed)
Victoza 0.6 mg till Friday then try 0.6 plus 5 clicks if sugar starts going up

## 2013-11-10 ENCOUNTER — Ambulatory Visit: Payer: BC Managed Care – PPO | Admitting: Endocrinology

## 2013-11-24 ENCOUNTER — Ambulatory Visit: Payer: BC Managed Care – PPO | Admitting: Endocrinology

## 2013-12-11 ENCOUNTER — Encounter: Payer: Self-pay | Admitting: Endocrinology

## 2013-12-11 ENCOUNTER — Ambulatory Visit (INDEPENDENT_AMBULATORY_CARE_PROVIDER_SITE_OTHER): Payer: BC Managed Care – PPO | Admitting: Endocrinology

## 2013-12-11 VITALS — BP 124/78 | HR 98 | Temp 98.3°F | Resp 12 | Ht 73.0 in | Wt 278.5 lb

## 2013-12-11 DIAGNOSIS — IMO0001 Reserved for inherently not codable concepts without codable children: Secondary | ICD-10-CM

## 2013-12-11 DIAGNOSIS — I10 Essential (primary) hypertension: Secondary | ICD-10-CM

## 2013-12-11 NOTE — Patient Instructions (Signed)
Victoza 0.9 at 8 pm, may leave off Glipizide next week  Please check blood sugars at least half the time about 2 hours after any meal and as directed on waking up. Please bring blood sugar monitor to each visit

## 2013-12-11 NOTE — Progress Notes (Addendum)
Patient ID: Jack Wallace, male   DOB: Sep 10, 1961, 52 y.o.   MRN: [id number]   Reason for Appointment : Followup of Type 2 Diabetes  History of Present Illness          Diagnosis: Type 2 diabetes mellitus, date of diagnosis: 1993         Past history: He has been treated with various drugs for his diabetes over the last several years. He has been taking metformin for at least 10 years and has been tolerating this. Over the years he has had additional medications to improve his control. He was also taking Actos at some point and not clear if he had any side effects. This was stopped because of fear of long-term effects The last 5 years he was on glipizide also. Previously was taking 10 mg twice a day. About 2 years ago his blood sugars were poorly controlled with readings up to 300 In early 2014 he started walking and lost 15 pounds. Subsequently he was getting low blood sugar during the day especially before lunch and sometimes in the afternoon His glipizide was  reduced but even with 5 mg twice a day he was getting hypoglycemia. This was reduced and Invokana added   Recent history:  He  had higher blood sugarsdespite taking Invokana, glipizide and metformin. Highest readings were in the morning  Because of inconsistent control he was started on Victoza on his  visit in 10/14 With this his blood sugars  were considerably better and fairly good throughout the day However  because of persistent nausea even during the night and occasional vomiting the dose was reduced to 0.6 and he was able to workup to 0.9 mg. For the last week he has tried 1.2 mg again but this causes nausea FASTING blood sugars are mildly increased but readings are better later in the day; however checking mostly in the morning  Currently taking glipizide only half tablet before supper and his Invokana was stopped because of lack of efficacy   Monitors blood glucose:  1.8 times a day        Glucometer:  One Touch Verio Blood  Glucose readings from meter download:   PREMEAL Breakfast Lunch Dinner Bedtime Overall  Glucose range:  95-180   64-173   97   68    Mean/median:      123    POST-MEAL PC Breakfast PC Lunch PC Dinner  Glucose range:  136, 148   96  ?   Mean/median:      The diet that the patient has been following [id number] to limit carbohydrates. Meals: 2 meals per day.  usually eating a frozen meal at breakfast 9 am and supper 6 pm and only crackers at lunch. Will have more snacks and evenings including chips and crackers        Dietician visit: Most recent:1993-95    Hypoglycemia frequency:  recently one time at 10  pm  with glucose  68 when he did not eat as well       Physical activity: exercise: No specific exercise, just walking to work and to the drug store which maybe 45 minutes both ways          His last A1c was  7.1 in 11/14, previously 7.3 on 08/17/13, done from outside lab  No results found for this basename: HGBA1C   Wt Readings from Last 3 Encounters:  12/11/13 278 lb 8 oz (126.327 kg)  11/06/13 283 lb 12.8 oz (128.731  kg)  10/06/13 288 lb 11.2 oz (130.953 kg)    Retinal exam: Most recent:. 7/13    Medication List       This list is accurate as of: 12/11/13  1:41 PM.  Always use your most recent med list.               gabapentin 100 MG capsule  Commonly known as:  NEURONTIN  Take 1 capsule (100 mg total) by mouth 3 (three) times daily.     glipiZIDE 5 MG tablet  Commonly known as:  GLUCOTROL  Take 1/2 tablet daily with breakfast     glucose blood test strip  Commonly known as:  ONETOUCH VERIO  Use as instructed to check blood sugars 2 times a day     Insulin Pen Needle 32G X 5 MM Misc  1 each by Does not apply route daily.     Liraglutide 18 MG/3ML Sopn  Inject 1.2 mg into the skin daily.     lisinopril-hydrochlorothiazide 20-25 MG per tablet  Commonly known as:  PRINZIDE,ZESTORETIC  Take 1 tablet by mouth daily. 1/2     metFORMIN 500 MG tablet  Commonly  known as:  GLUCOPHAGE  Take 500 mg by mouth 2 (two) times daily with a meal. 2 tabs twice dialy     ONETOUCH DELICA LANCETS FINE Misc  1 each by Does not apply route 2 (two) times daily.     pravastatin 40 MG tablet  Commonly known as:  PRAVACHOL  Take 40 mg by mouth daily.        Allergies: No Known Allergies  Past Medical History  Diagnosis Date  . Diabetes mellitus without complication   . Hypertension   . Hyperlipidemia     Past Surgical History  Procedure Laterality Date  . Nasal septum surgery      Family History  Problem Relation Age of Onset  . Hypertension Mother   . Diabetes Maternal Grandfather     Social History:  reports that he has never smoked. He has never used smokeless tobacco. His alcohol and drug histories are not on file.    Review of Systems      Hyperlipidemia: taking pravastatin  40 mg for several years .Lipids show LDL 77 with triglycerides 191 and HDL 38 in August      The blood pressure has been controlled with Zestoretic.   Had complained of right foot burning and was given gabapentin    Physical Examination:  Wt 278 lb 8 oz (126.327 kg)  No lower leg edema   ASSESSMENT/PLAN:   Diabetes type 2, uncontrolled -  He has had  s improvement in his blood sugar control with adding Victoza  to his low dose glipizide at suppertime and metformin; has better results compared to Invokana. Does have mildly increased readings  mostly  in the morning but is not checking enough readings after meals However has lost some more weight  Since he has significant nausea with Victoza  1.2 mg will try him back on  0.9; he is comfortable continuing the medication and can try changing this to late in the evening rather than in the morning Since his A1c has been previously close to 7% reassured him that mild hyperglycemia in the morning may not be significant  HYPERTENSION: Well controlled     Lilyann Gravelle 12/11/2013, 1:41 PM

## 2014-01-08 ENCOUNTER — Ambulatory Visit: Payer: BC Managed Care – PPO | Admitting: Endocrinology

## 2014-01-23 ENCOUNTER — Other Ambulatory Visit: Payer: Self-pay | Admitting: *Deleted

## 2014-01-23 MED ORDER — METFORMIN HCL 500 MG PO TABS
ORAL_TABLET | ORAL | Status: DC
Start: 2014-01-23 — End: 2014-02-19

## 2014-01-24 ENCOUNTER — Ambulatory Visit: Payer: BC Managed Care – PPO | Admitting: Endocrinology

## 2014-02-07 ENCOUNTER — Ambulatory Visit: Payer: BC Managed Care – PPO | Admitting: Endocrinology

## 2014-02-19 ENCOUNTER — Encounter: Payer: Self-pay | Admitting: Endocrinology

## 2014-02-19 ENCOUNTER — Ambulatory Visit (INDEPENDENT_AMBULATORY_CARE_PROVIDER_SITE_OTHER): Payer: BC Managed Care – PPO | Admitting: Endocrinology

## 2014-02-19 ENCOUNTER — Other Ambulatory Visit: Payer: Self-pay | Admitting: *Deleted

## 2014-02-19 VITALS — BP 118/72 | HR 95 | Temp 98.0°F | Resp 16 | Ht 73.0 in | Wt 278.6 lb

## 2014-02-19 DIAGNOSIS — I10 Essential (primary) hypertension: Secondary | ICD-10-CM

## 2014-02-19 DIAGNOSIS — E1165 Type 2 diabetes mellitus with hyperglycemia: Principal | ICD-10-CM

## 2014-02-19 DIAGNOSIS — E1149 Type 2 diabetes mellitus with other diabetic neurological complication: Secondary | ICD-10-CM

## 2014-02-19 DIAGNOSIS — E1142 Type 2 diabetes mellitus with diabetic polyneuropathy: Secondary | ICD-10-CM

## 2014-02-19 DIAGNOSIS — E785 Hyperlipidemia, unspecified: Secondary | ICD-10-CM

## 2014-02-19 DIAGNOSIS — IMO0001 Reserved for inherently not codable concepts without codable children: Secondary | ICD-10-CM

## 2014-02-19 DIAGNOSIS — E1140 Type 2 diabetes mellitus with diabetic neuropathy, unspecified: Secondary | ICD-10-CM

## 2014-02-19 MED ORDER — LIRAGLUTIDE 18 MG/3ML ~~LOC~~ SOPN: 1.2000 mg | PEN_INJECTOR | Freq: Every day | SUBCUTANEOUS | Status: DC

## 2014-02-19 MED ORDER — GABAPENTIN 600 MG PO TABS: 600.0000 mg | ORAL_TABLET | Freq: Three times a day (TID) | ORAL | Status: DC

## 2014-02-19 MED ORDER — GLIPIZIDE 5 MG PO TABS: ORAL_TABLET | ORAL | Status: DC

## 2014-02-19 MED ORDER — METFORMIN HCL 500 MG PO TABS: ORAL_TABLET | ORAL | Status: DC

## 2014-02-19 NOTE — Progress Notes (Signed)
Patient ID: Jack Wallace, male   DOB: 01-29-1961, 53 y.o.   MRN: [id number]   Reason for Appointment : Followup of Type 2 Diabetes  History of Present Illness            Diagnosis: Type 2 diabetes mellitus, date of diagnosis: 1993         Past history: He has been treated with various drugs for his diabetes over the last several years. He has been taking metformin for at least 10 years and has been tolerating this. Over the years he has had additional medications to improve his control. He was also taking Actos at some point and not clear if he had any side effects. This was stopped because of fear of long-term effects The last 5 years he was on glipizide also. Previously was taking 10 mg twice a day. About 2 years ago his blood sugars were poorly controlled with readings up to 300 In early 2014 he started walking and lost 15 pounds. Subsequently he was getting low blood sugar during the day especially before lunch and sometimes in the afternoon His glipizide was  reduced but even with 5 mg twice a day he was getting hypoglycemia. This was reduced and Invokana added   Recent history:  Because of inadequate control he was started on Victoza on his  visit in 10/14 in addition to his metformin and glipizide With this his blood sugars were considerably better and fairly good throughout the day  Invokana was stopped because of lack of clear benefit in 09/2013 He has had difficulty with nausea related to Victoza but now with using 0.9 mg he is now getting only very occasional symptoms However has not been able to lose much weight recently despite being fairly active including at work He now says that he is taking his glipizide half tablet in the morning when his blood sugar is slightly higher but previously was supposed to be taking it with evening meal He does not check his sugars after supper Overall blood sugar control is improved with more consistently good fasting readings in the morning and  improved A1c  Monitors blood glucose:  1.8 times a day        Glucometer:  One Touch Verio Blood Glucose readings from meter download:   PREMEAL Breakfast Lunch  afternoon  Bedtime Overall  Glucose range:  108-136   69-138   88-104     Mean/median:  119  98     110    The diet that the patient has been following MH:[id number] to limit carbohydrates. Meals: 2 meals per day.  usually eating a frozen meal at breakfast 9 am and supper 6 pm and only crackers at lunch. Will have more snacks and evenings including chips and crackers        Dietician visit: Most recent:1993-95    Hypoglycemia : Minimal, has had one low normal blood sugar at lunchtime      Physical activity: exercise: No specific exercise, just walking to work and to the drug store which maybe 45 minutes both ways          His last A1c was 6.5 on 01/17/14, previously 7.1 in 11/14 and 7.3 on 08/17/13, done from outside lab  No results found for this basename: HGBA1C   Retinal exam: Most recent:. 7/13    Medication List       This list is accurate as of: 02/19/14  3:25 PM.  Always use your most recent med list.  gabapentin 100 MG capsule  Commonly known as:  NEURONTIN  Take 1 capsule (100 mg total) by mouth 3 (three) times daily.     glipiZIDE 5 MG tablet  Commonly known as:  GLUCOTROL  Take 1/2 tablet daily with breakfast     glucose blood test strip  Commonly known as:  ONETOUCH VERIO  Use as instructed to check blood sugars 2 times a day     Insulin Pen Needle 32G X 5 MM Misc  1 each by Does not apply route daily.     Liraglutide 18 MG/3ML Sopn  Inject 1.2 mg into the skin daily.     lisinopril-hydrochlorothiazide 20-25 MG per tablet  Commonly known as:  PRINZIDE,ZESTORETIC  Take 1 tablet by mouth daily. 1/2     metFORMIN 500 MG tablet  Commonly known as:  GLUCOPHAGE  2 tabs twice daily     ONETOUCH DELICA LANCETS FINE Misc  1 each by Does not apply route 2 (two) times daily.     pravastatin  40 MG tablet  Commonly known as:  PRAVACHOL  Take 40 mg by mouth daily.        Allergies: No Known Allergies  Past Medical History  Diagnosis Date  . Diabetes mellitus without complication   . Hypertension   . Hyperlipidemia     Past Surgical History  Procedure Laterality Date  . Nasal septum surgery      Family History  Problem Relation Age of Onset  . Hypertension Mother   . Diabetes Maternal Grandfather     Social History:  reports that he has never smoked. He has never used smokeless tobacco. His alcohol and drug histories are not on file.    Review of Systems      Hyperlipidemia: taking pravastatin  40 mg for several years .Lipids show LDL 73 with triglycerides [id number] and HDL 36, done on 01/17/14     The blood pressure has been controlled with Zestoretic. BP was 90/62 at work with mild lightheadedness and his work nurse asked him to take the tablet in half twice a day  He complains of worsening right foot burning which is during the day mostly,: Somewhat better when he takes off his shoe. Only mild sharp pains. No symptoms in the left side and no radiation to the rest of the foot or leg Also sometimes has symptoms at night. On his own he has increased his  gabapentin, taking 300mg  in the morning and 600 mg hs    Physical Examination:  BP 118/72  Pulse 95  Temp(Src) 98 F (36.7 C)  Resp 16  Ht 6\' 1"  (1.854 m)  Wt 278 lb 9.6 oz (126.372 kg)  BMI 36.76 kg/m2  SpO2 96%  No  edema  His foot is normal to inspection and there is no tenderness or swelling of the joints distally  ASSESSMENT/PLAN:   Diabetes type 2:  His record show improvement in his blood sugar control with adding Victoza  to his low dose glipizide and metformin; has better results compared to Invokana. Does have mildly increased readings  mostly  in the morning although these are overall relatively better than the last time and not fluctuating as much He is supposed to be taking his glipizide 2.5  mg at dinnertime but is taking it in the morning causing low normal readings are lunchtime However has not lost any more weight but is not able to tolerate more than 0.9 mg Victoza  He will change his glipizide  to half tablet with dinner time instead of morning and try to check more readings after supper which he is not doing right now  Right foot burning: Likely to be from diabetic neuropathy as no other etiology evident. For now will have him increase to have pending to 600 mg twice a day at least. Also he can try OTC alpha lipoic acid supplements  HYPERTENSION: Blood pressure is low normal and will reduce his Zestoretic to half tablet, he can continue monitoring periodically at work  Lipids: Excellent now   Total visit time including counseling = 25 minutes  Tahj Njoku 02/19/2014, 3:25 PM

## 2014-02-19 NOTE — Patient Instructions (Addendum)
Take glipizide only half tablet before supper  More sugars at bedtime  May take Gabapentin 600mg  upto 3x daily  May take Alpha lipoic acid 600mg  OTC  BP Rx 1/2 daily

## 2014-05-07 ENCOUNTER — Encounter (HOSPITAL_COMMUNITY): Payer: Self-pay | Admitting: Emergency Medicine

## 2014-05-07 ENCOUNTER — Emergency Department (HOSPITAL_COMMUNITY): Payer: BC Managed Care – PPO

## 2014-05-07 ENCOUNTER — Inpatient Hospital Stay (HOSPITAL_COMMUNITY)
Admission: EM | Admit: 2014-05-07 | Discharge: 2014-05-17 | DRG: 394 | Disposition: A | Discharge status: Home / Self Care | Payer: BC Managed Care – PPO | Attending: Internal Medicine | Admitting: Internal Medicine

## 2014-05-07 DIAGNOSIS — N179 Acute kidney failure, unspecified: Secondary | ICD-10-CM | POA: Diagnosis present

## 2014-05-07 DIAGNOSIS — K559 Vascular disorder of intestine, unspecified: Principal | ICD-10-CM | POA: Diagnosis present

## 2014-05-07 DIAGNOSIS — E119 Type 2 diabetes mellitus without complications: Secondary | ICD-10-CM | POA: Diagnosis present

## 2014-05-07 DIAGNOSIS — E1140 Type 2 diabetes mellitus with diabetic neuropathy, unspecified: Secondary | ICD-10-CM | POA: Diagnosis present

## 2014-05-07 DIAGNOSIS — D72829 Elevated white blood cell count, unspecified: Secondary | ICD-10-CM | POA: Diagnosis present

## 2014-05-07 DIAGNOSIS — IMO0001 Reserved for inherently not codable concepts without codable children: Secondary | ICD-10-CM

## 2014-05-07 DIAGNOSIS — E876 Hypokalemia: Secondary | ICD-10-CM | POA: Diagnosis present

## 2014-05-07 DIAGNOSIS — R197 Diarrhea, unspecified: Secondary | ICD-10-CM | POA: Diagnosis not present

## 2014-05-07 DIAGNOSIS — R55 Syncope and collapse: Secondary | ICD-10-CM | POA: Diagnosis present

## 2014-05-07 DIAGNOSIS — E46 Unspecified protein-calorie malnutrition: Secondary | ICD-10-CM | POA: Diagnosis present

## 2014-05-07 DIAGNOSIS — I10 Essential (primary) hypertension: Secondary | ICD-10-CM

## 2014-05-07 DIAGNOSIS — Z7982 Long term (current) use of aspirin: Secondary | ICD-10-CM

## 2014-05-07 DIAGNOSIS — F411 Generalized anxiety disorder: Secondary | ICD-10-CM | POA: Diagnosis present

## 2014-05-07 DIAGNOSIS — R5381 Other malaise: Secondary | ICD-10-CM | POA: Diagnosis present

## 2014-05-07 DIAGNOSIS — E1165 Type 2 diabetes mellitus with hyperglycemia: Secondary | ICD-10-CM

## 2014-05-07 DIAGNOSIS — R109 Unspecified abdominal pain: Secondary | ICD-10-CM | POA: Diagnosis present

## 2014-05-07 DIAGNOSIS — S37009A Unspecified injury of unspecified kidney, initial encounter: Secondary | ICD-10-CM

## 2014-05-07 DIAGNOSIS — R1013 Epigastric pain: Secondary | ICD-10-CM

## 2014-05-07 DIAGNOSIS — K3189 Other diseases of stomach and duodenum: Secondary | ICD-10-CM | POA: Diagnosis present

## 2014-05-07 DIAGNOSIS — Z79899 Other long term (current) drug therapy: Secondary | ICD-10-CM

## 2014-05-07 DIAGNOSIS — K6389 Other specified diseases of intestine: Secondary | ICD-10-CM | POA: Diagnosis present

## 2014-05-07 DIAGNOSIS — I959 Hypotension, unspecified: Secondary | ICD-10-CM | POA: Diagnosis present

## 2014-05-07 DIAGNOSIS — K648 Other hemorrhoids: Secondary | ICD-10-CM | POA: Diagnosis not present

## 2014-05-07 DIAGNOSIS — E1149 Type 2 diabetes mellitus with other diabetic neurological complication: Secondary | ICD-10-CM | POA: Diagnosis present

## 2014-05-07 DIAGNOSIS — Z6836 Body mass index (BMI) 36.0-36.9, adult: Secondary | ICD-10-CM

## 2014-05-07 DIAGNOSIS — R1031 Right lower quadrant pain: Secondary | ICD-10-CM

## 2014-05-07 DIAGNOSIS — E785 Hyperlipidemia, unspecified: Secondary | ICD-10-CM | POA: Diagnosis present

## 2014-05-07 DIAGNOSIS — E1142 Type 2 diabetes mellitus with diabetic polyneuropathy: Secondary | ICD-10-CM | POA: Diagnosis present

## 2014-05-07 DIAGNOSIS — K921 Melena: Secondary | ICD-10-CM

## 2014-05-07 LAB — CBC WITH DIFFERENTIAL/PLATELET
Basophils Absolute: 0 10*3/uL (ref 0.0–0.1)
Basophils Relative: 0 % (ref 0–1)
Eosinophils Absolute: 0.1 10*3/uL (ref 0.0–0.7)
Eosinophils Relative: 1 % (ref 0–5)
HCT: 37 % — ABNORMAL LOW (ref 39.0–52.0)
HEMOGLOBIN: 12.4 g/dL — AB (ref 13.0–17.0)
LYMPHS ABS: 1.6 10*3/uL (ref 0.7–4.0)
Lymphocytes Relative: 14 % (ref 12–46)
MCH: 29.7 pg (ref 26.0–34.0)
MCHC: 33.5 g/dL (ref 30.0–36.0)
MCV: 88.7 fL (ref 78.0–100.0)
MONOS PCT: 8 % (ref 3–12)
Monocytes Absolute: 0.9 10*3/uL (ref 0.1–1.0)
NEUTROS ABS: 9.3 10*3/uL — AB (ref 1.7–7.7)
NEUTROS PCT: 77 % (ref 43–77)
Platelets: 198 10*3/uL (ref 150–400)
RBC: 4.17 MIL/uL — AB (ref 4.22–5.81)
RDW: 12.8 % (ref 11.5–15.5)
WBC: 12 10*3/uL — AB (ref 4.0–10.5)

## 2014-05-07 LAB — COMPREHENSIVE METABOLIC PANEL
ALBUMIN: 4.1 g/dL (ref 3.5–5.2)
ALK PHOS: 39 U/L (ref 39–117)
ALT: 14 U/L (ref 0–53)
AST: 15 U/L (ref 0–37)
BUN: 26 mg/dL — ABNORMAL HIGH (ref 6–23)
CO2: 21 mEq/L (ref 19–32)
Calcium: 9.1 mg/dL (ref 8.4–10.5)
Chloride: 104 mEq/L (ref 96–112)
Creatinine, Ser: 2.06 mg/dL — ABNORMAL HIGH (ref 0.50–1.35)
GFR calc Af Amer: 41 mL/min — ABNORMAL LOW (ref >90)
GFR calc non Af Amer: 35 mL/min — ABNORMAL LOW (ref >90)
GLUCOSE: 123 mg/dL — AB (ref 70–99)
POTASSIUM: 4.5 meq/L (ref 3.7–5.3)
SODIUM: 140 meq/L (ref 137–147)
Total Bilirubin: 0.5 mg/dL (ref 0.3–1.2)
Total Protein: 6.6 g/dL (ref 6.0–8.3)

## 2014-05-07 LAB — I-STAT CG4 LACTIC ACID, ED: Lactic Acid, Venous: 1.33 mmol/L (ref 0.5–2.2)

## 2014-05-07 LAB — ABO/RH: ABO/RH(D): O POS

## 2014-05-07 LAB — URINALYSIS, ROUTINE W REFLEX MICROSCOPIC
BILIRUBIN URINE: NEGATIVE
Glucose, UA: NEGATIVE mg/dL
Hgb urine dipstick: NEGATIVE
KETONES UR: NEGATIVE mg/dL
Leukocytes, UA: NEGATIVE
Nitrite: NEGATIVE
PH: 5 (ref 5.0–8.0)
PROTEIN: NEGATIVE mg/dL
Specific Gravity, Urine: 1.037 — ABNORMAL HIGH (ref 1.005–1.030)
UROBILINOGEN UA: 0.2 mg/dL (ref 0.0–1.0)

## 2014-05-07 LAB — I-STAT TROPONIN, ED: Troponin i, poc: 0 ng/mL (ref 0.00–0.08)

## 2014-05-07 LAB — CBG MONITORING, ED
Glucose-Capillary: 125 mg/dL — ABNORMAL HIGH (ref 70–99)
Glucose-Capillary: 126 mg/dL — ABNORMAL HIGH (ref 70–99)

## 2014-05-07 LAB — PROTIME-INR
INR: 1 (ref 0.00–1.49)
PROTHROMBIN TIME: 13 s (ref 11.6–15.2)

## 2014-05-07 LAB — POC OCCULT BLOOD, ED: FECAL OCCULT BLD: NEGATIVE

## 2014-05-07 LAB — PRO B NATRIURETIC PEPTIDE: PRO B NATRI PEPTIDE: 37.8 pg/mL (ref 0–125)

## 2014-05-07 LAB — TYPE AND SCREEN
ABO/RH(D): O POS
Antibody Screen: NEGATIVE

## 2014-05-07 LAB — LIPASE, BLOOD: LIPASE: 43 U/L (ref 11–59)

## 2014-05-07 LAB — TROPONIN I

## 2014-05-07 MED ORDER — METRONIDAZOLE IN NACL 5-0.79 MG/ML-% IV SOLN
500.0000 mg | Freq: Three times a day (TID) | INTRAVENOUS | Status: DC
  Administered 2014-05-07 – 2014-05-15 (×23): 500 mg via INTRAVENOUS
  Filled 2014-05-07 (×27): qty 100

## 2014-05-07 MED ORDER — HEPARIN BOLUS VIA INFUSION
5000.0000 [IU] | Freq: Once | INTRAVENOUS | Status: DC
  Filled 2014-05-07: qty 5000

## 2014-05-07 MED ORDER — HEPARIN (PORCINE) IN NACL 100-0.45 UNIT/ML-% IJ SOLN
1700.0000 [IU]/h | INTRAMUSCULAR | Status: DC
  Filled 2014-05-07: qty 250

## 2014-05-07 MED ORDER — SODIUM CHLORIDE 0.9 % IV BOLUS (SEPSIS)
1000.0000 mL | Freq: Once | INTRAVENOUS | Status: AC
  Administered 2014-05-07: 1000 mL via INTRAVENOUS

## 2014-05-07 MED ORDER — HYDROMORPHONE HCL PF 1 MG/ML IJ SOLN
1.0000 mg | Freq: Once | INTRAMUSCULAR | Status: AC
  Administered 2014-05-07: 1 mg via INTRAVENOUS
  Filled 2014-05-07: qty 1

## 2014-05-07 MED ORDER — INSULIN ASPART 100 UNIT/ML ~~LOC~~ SOLN: 0.0000 [IU] | SUBCUTANEOUS | Status: DC

## 2014-05-07 MED ORDER — HYDROMORPHONE HCL PF 1 MG/ML IJ SOLN
1.0000 mg | Freq: Once | INTRAMUSCULAR | Status: DC
  Administered 2014-05-07: 1 mg via INTRAVENOUS
  Filled 2014-05-07: qty 1

## 2014-05-07 MED ORDER — IOHEXOL 350 MG/ML SOLN
100.0000 mL | Freq: Once | INTRAVENOUS | Status: AC | PRN
  Administered 2014-05-07: 100 mL via INTRAVENOUS

## 2014-05-07 MED ORDER — INSULIN ASPART 100 UNIT/ML ~~LOC~~ SOLN
0.0000 [IU] | SUBCUTANEOUS | Status: DC
  Administered 2014-05-08: 3 [IU] via SUBCUTANEOUS
  Administered 2014-05-08 – 2014-05-09 (×4): 2 [IU] via SUBCUTANEOUS

## 2014-05-07 MED ORDER — HEPARIN (PORCINE) IN NACL 100-0.45 UNIT/ML-% IJ SOLN: 16.0000 [IU]/kg/h | INTRAMUSCULAR | Status: DC

## 2014-05-07 MED ORDER — HYDROMORPHONE HCL PF 1 MG/ML IJ SOLN
0.5000 mg | Freq: Once | INTRAMUSCULAR | Status: AC
  Administered 2014-05-07: 0.5 mg via INTRAVENOUS
  Filled 2014-05-07: qty 1

## 2014-05-07 MED ORDER — GABAPENTIN 600 MG PO TABS
600.0000 mg | ORAL_TABLET | Freq: Three times a day (TID) | ORAL | Status: DC
  Administered 2014-05-08 – 2014-05-17 (×28): 600 mg via ORAL
  Filled 2014-05-07 (×31): qty 1

## 2014-05-07 MED ORDER — PIPERACILLIN-TAZOBACTAM 3.375 G IVPB 30 MIN
3.3750 g | Freq: Once | INTRAVENOUS | Status: AC
  Administered 2014-05-07: 3.375 g via INTRAVENOUS
  Filled 2014-05-07: qty 50

## 2014-05-07 MED ORDER — HEPARIN SODIUM (PORCINE) 5000 UNIT/ML IJ SOLN: 60.0000 [IU]/kg | Freq: Once | INTRAMUSCULAR | Status: DC

## 2014-05-07 NOTE — ED Notes (Signed)
Per MD Reather Converse, will hold Heparin until approved by admitting MD

## 2014-05-07 NOTE — ED Notes (Signed)
MD at bedside. 

## 2014-05-07 NOTE — ED Notes (Signed)
Attempted to give report 

## 2014-05-07 NOTE — ED Notes (Signed)
Pt returned from CT °

## 2014-05-07 NOTE — ED Notes (Signed)
Md Zavitz at bedside performing abdominal ultrasound. Per MD, no critical findings at this time.

## 2014-05-07 NOTE — Consult Note (Signed)
Reason for Consult:pneumatosis Referring Physician: Dr Kristen Loader is an 53 y.o. male.  HPI: this 53 year old morbidly obese Caucasian male referred by Dr. Fredric Dine for evaluation of abdominal pain. Patient was in his normal state of health until earlier this afternoon when he developed nausea and dizziness and acute onset of epigastric abdominal discomfort that he rated as 7/10. This occurred while he was at work. Upon standing when EMS arrived, the patient passed out. Reportedly he had some hypotension in route and on arrival. He responded to IV fluids. A CT scan of his abdomen and pelvis revealed pneumatosis of the cecum as well as some portal venous gas. He states his abdominal discomfort is now more in his lower abdomen primarily on his right side. He denies any palpitations or chest pain. He does not take any blood thinners. He is a diabetic. And takes medicine for hypertension and hypercholesterolemia. He had a scheduled visit with the nurse practitioner at work this morning and he reports everything was normal. There is no medication changes this morning at work. He reports normal bowel movements. He denies any melanoma or hematochezia. He reports normal urination. He denies any prior abdominal surgery.  Past Medical History  Diagnosis Date  . Diabetes mellitus without complication   . Hypertension   . Hyperlipidemia     Past Surgical History  Procedure Laterality Date  . Nasal septum surgery      Family History  Problem Relation Age of Onset  . Hypertension Mother   . Diabetes Maternal Grandfather     Social History:  reports that he has never smoked. He has never used smokeless tobacco. He reports that he does not drink alcohol or use illicit drugs.  Allergies: No Known Allergies  Medications: I have reviewed the patient's current medications.  Results for orders placed during the hospital encounter of 05/07/14 (from the past 48 hour(s))  CBC WITH DIFFERENTIAL     Status:  Abnormal   Collection Time    05/07/14  4:01 PM      Result Value Ref Range   WBC 12.0 (*) 4.0 - 10.5 K/uL   RBC 4.17 (*) 4.22 - 5.81 MIL/uL   Hemoglobin 12.4 (*) 13.0 - 17.0 g/dL   HCT 37.0 (*) 39.0 - 52.0 %   MCV 88.7  78.0 - 100.0 fL   MCH 29.7  26.0 - 34.0 pg   MCHC 33.5  30.0 - 36.0 g/dL   RDW 12.8  11.5 - 15.5 %   Platelets 198  150 - 400 K/uL   Neutrophils Relative % 77  43 - 77 %   Neutro Abs 9.3 (*) 1.7 - 7.7 K/uL   Lymphocytes Relative 14  12 - 46 %   Lymphs Abs 1.6  0.7 - 4.0 K/uL   Monocytes Relative 8  3 - 12 %   Monocytes Absolute 0.9  0.1 - 1.0 K/uL   Eosinophils Relative 1  0 - 5 %   Eosinophils Absolute 0.1  0.0 - 0.7 K/uL   Basophils Relative 0  0 - 1 %   Basophils Absolute 0.0  0.0 - 0.1 K/uL  COMPREHENSIVE METABOLIC PANEL     Status: Abnormal   Collection Time    05/07/14  4:01 PM      Result Value Ref Range   Sodium 140  137 - 147 mEq/L   Potassium 4.5  3.7 - 5.3 mEq/L   Chloride 104  96 - 112 mEq/L   CO2 21  19 - 32 mEq/L   Glucose, Bld 123 (*) 70 - 99 mg/dL   BUN 26 (*) 6 - 23 mg/dL   Creatinine, Ser 0.98 (*) 0.50 - 1.35 mg/dL   Calcium 9.1  8.4 - 28.6 mg/dL   Total Protein 6.6  6.0 - 8.3 g/dL   Albumin 4.1  3.5 - 5.2 g/dL   AST 15  0 - 37 U/L   ALT 14  0 - 53 U/L   Alkaline Phosphatase 39  39 - 117 U/L   Total Bilirubin 0.5  0.3 - 1.2 mg/dL   GFR calc non Af Amer 35 (*) >90 mL/min   GFR calc Af Amer 41 (*) >90 mL/min   Comment: (NOTE)     The eGFR has been calculated using the CKD EPI equation.     This calculation has not been validated in all clinical situations.     eGFR's persistently <90 mL/min signify possible Chronic Kidney     Disease.  PROTIME-INR     Status: None   Collection Time    05/07/14  4:01 PM      Result Value Ref Range   Prothrombin Time 13.0  11.6 - 15.2 seconds   INR 1.00  0.00 - 1.49  LIPASE, BLOOD     Status: None   Collection Time    05/07/14  4:01 PM      Result Value Ref Range   Lipase 43  11 - 59 U/L  PRO B  NATRIURETIC PEPTIDE     Status: None   Collection Time    05/07/14  4:12 PM      Result Value Ref Range   Pro B Natriuretic peptide (BNP) 37.8  0 - 125 pg/mL  TYPE AND SCREEN     Status: None   Collection Time    05/07/14  4:15 PM      Result Value Ref Range   ABO/RH(D) O POS     Antibody Screen NEG     Sample Expiration 05/10/2014    ABO/RH     Status: None   Collection Time    05/07/14  4:15 PM      Result Value Ref Range   ABO/RH(D) O POS    POC OCCULT BLOOD, ED     Status: None   Collection Time    05/07/14  4:32 PM      Result Value Ref Range   Fecal Occult Bld NEGATIVE  NEGATIVE  I-STAT TROPOININ, ED     Status: None   Collection Time    05/07/14  4:33 PM      Result Value Ref Range   Troponin i, poc 0.00  0.00 - 0.08 ng/mL   Comment 3            Comment: Due to the release kinetics of cTnI,     a negative result within the first hours     of the onset of symptoms does not rule out     myocardial infarction with certainty.     If myocardial infarction is still suspected,     repeat the test at appropriate intervals.  I-STAT CG4 LACTIC ACID, ED     Status: None   Collection Time    05/07/14  4:35 PM      Result Value Ref Range   Lactic Acid, Venous 1.33  0.5 - 2.2 mmol/L  CBG MONITORING, ED     Status: Abnormal   Collection Time    05/07/14  8:39 PM      Result Value Ref Range   Glucose-Capillary 125 (*) 70 - 99 mg/dL    Dg Chest Port 1 View  05/07/2014   CLINICAL DATA:  Dizziness, abdominal pain  EXAM: PORTABLE CHEST - 1 VIEW  COMPARISON:  None.  FINDINGS: The heart size and mediastinal contours are within normal limits. Both lungs are clear. The visualized skeletal structures are unremarkable.  IMPRESSION: No active disease.   Electronically Signed   By: Kathreen Devoid   On: 05/07/2014 16:40   Ct Angio Abd/pel W/ And/or W/o  05/07/2014   CLINICAL DATA:  Severe mid abdominal pain, hypotension, concern for abdominal dissection.  EXAM: CT ANGIOGRAPHY ABDOMEN AND  PELVIS WITH CONTRAST AND WITHOUT CONTRAST  TECHNIQUE: Multidetector CT imaging of the abdomen and pelvis was performed using the standard protocol during bolus administration of intravenous contrast. Multiplanar reconstructed images including MIPs were obtained and reviewed to evaluate the vascular anatomy.  CONTRAST:  132m OMNIPAQUE IOHEXOL 350 MG/ML SOLN  COMPARISON:  None.  FINDINGS: Vascular Findings:  Abdominal aorta: Normal caliber of the abdominal aorta. There is no significant atherosclerotic plaque within the abdominal aorta. No abdominal aortic dissection or periaortic stranding.  Celiac artery: Widely patent without hemodynamically significant narrowing.  SMA: Widely patent without hemodynamically significant narrowing. Incidental note is made of a replaced right hepatic artery which arises from the proximal SMA. Note, evaluation of the mid/ distal aspect of the main trunk of the SMA is minimally degraded secondary to respiratory artifact. The distal tributaries of the SMA (particularly of the of supplying the right lower abdominal quadrant) are widely patent. There are no discrete filling defects to suggest distal embolism.  Right Renal artery: Solitary; widely patent; the right inferior phrenic artery is noted to arise from the proximal aspect of the right renal artery.  Left Renal artery: Solitary; widely patent without hemodynamically significant narrowing.  IMA: Widely patent  Pelvic vasculature: The bilateral common, external and internal iliac arteries are of normal caliber and widely patent without hemodynamically significant narrowing.  Review of the MIP images confirms the above findings.   --------------------------------------------------------------------------------  Nonvascular Findings:  Pneumatosis is seen within the wall of the cecum (representative axial images 124, 130, 136 and 143; sagittal image 24, series 7). There is air seen within multiple draining mesenteric veins  (representative images 80, 81 104, 121 and 122, series 4). These findings are associated with a minimal amount of portal venous gas seen primarily within the nondependent portions of the left lobe of the liver (representative images 47 and 52). The etiology of this pneumatosis is not depicted on this examination. Specifically, there are no discrete filling defects within the SMA tributaries with supplying the cecum. No evidence of volvulus. No pneumoperitoneum. No definable/drainable intra-abdominal abscess. Normal appearance of the appendix.  The bowel is otherwise normal in course and caliber without wall thickening or evidence of obstruction. Normal appearance of the terminal ileum. There is a moderate amount of liquid stool throughout the colon. No evidence of enteric obstruction.  Shotty porta hepatis lymph nodes are individually not enlarged by size criteria with index gastrohepatic lymph node measuring 1.1 cm in greatest short axis diameter (image 67, series 4). No retroperitoneal, mesenteric, pelvic or inguinal lymphadenopathy.  Normal hepatic contour. No discrete hepatic lesions. Normal appearance of the gallbladder given degree distention. No intra extrahepatic biliary dilatation. No ascites.  There is symmetric enhancement of the bilateral kidneys. No discrete renal lesions. No definite renal stones on this  postcontrast examination. There is a very minimal amount of grossly symmetric perinephric stranding. No urinary obstruction. Normal appearance of the bilateral adrenal glands, pancreas and spleen.  Scattered calcifications within a normal size prostate gland. Normal appearance of the urinary bladder given degree distention. No free fluid within the pelvis.  No acute or aggressive osseus abnormalities. Stigmata of DISH within the lower thoracic and upper lumbar spine.  Small mesenteric fat containing periumbilical hernia. There is a peripherally calcified subcutaneous nodule within the pannus of the  left lower abdomen, likely a location of fat necrosis. Small mesenteric fat containing inguinal hernias.  IMPRESSION: 1. Pneumatosis involving the cecum with associated air within the draining mesenteric veins and small amount of portal venous gas. The etiology of this pneumatosis is not depicted as examination. Specifically, there is no evidence of abdominal aortic aneurysm or distal embolism affecting the distal tributaries of the SMA supplying the cecum and there is no evidence of cecal volvulus. Further evaluation could be performed with cardiac echo to evaluate for a potential cardiogenic source of a potential resolved distal embolism as clinically indicated. 2. No evidence of abdominal aortic aneurysm or significant atherosclerotic plaque affecting the abdominal aorta.  Critical Value/emergent results were called by telephone at the time of interpretation on 05/07/2014 at 5:02 PM to Dr. Elnora Morrison , who verbally acknowledged these results.   Electronically Signed   By: Sandi Mariscal M.D.   On: 05/07/2014 17:21    Review of Systems  Constitutional: Positive for weight loss (35 pounds - planned). Negative for fever and chills.  HENT: Negative for nosebleeds.   Respiratory: Negative for shortness of breath.   Cardiovascular: Negative for chest pain, palpitations, orthopnea, leg swelling and PND.       Denies DOE  Gastrointestinal: Positive for nausea and abdominal pain. Negative for vomiting, diarrhea, constipation, blood in stool and melena.  Genitourinary: Negative for dysuria and hematuria.  Musculoskeletal: Negative.   Skin: Negative for itching and rash.  Neurological: Positive for dizziness. Negative for focal weakness, seizures and headaches.       Denies TIAs, amaurosis fugax  Endo/Heme/Allergies: Does not bruise/bleed easily.  Psychiatric/Behavioral: The patient is not nervous/anxious.    Blood pressure 98/60, pulse 88, temperature 97.6 F (36.4 C), temperature source Oral, resp. rate  12, height 6' 0.84" (1.85 m), weight 278 lb 10.6 oz (126.4 kg), SpO2 99.00%. Physical Exam  Vitals reviewed. Constitutional: He is oriented to person, place, and time. He appears well-developed and well-nourished. No distress.  Morbidly obese  HENT:  Head: Normocephalic and atraumatic.  Right Ear: External ear normal.  Left Ear: External ear normal.  Eyes: Conjunctivae are normal. No scleral icterus.  Neck: Normal range of motion. Neck supple. No tracheal deviation present. No thyromegaly present.  Cardiovascular: Normal rate, normal heart sounds and intact distal pulses.   Respiratory: Effort normal and breath sounds normal. No stridor. No respiratory distress. He has no wheezes.  GI: Soft. He exhibits no distension. There is no rebound and no guarding.  Obese; no RT/guarding/peritonitis; very mild TTP on Rt abd  Musculoskeletal: He exhibits no edema and no tenderness.  Lymphadenopathy:    He has no cervical adenopathy.  Neurological: He is alert and oriented to person, place, and time. He exhibits normal muscle tone.  Skin: Skin is warm and dry. No rash noted. He is not diaphoretic. No erythema. No pallor.  Psychiatric: He has a normal mood and affect. His behavior is normal. Judgment and thought content normal.  Assessment/Plan: Syncopal episode Hypotension Acute kidney injury Cecal pneumatosis PV gas Right sided abdominal pain Morbid obesity H/o HTN DM 2 HPL  rec medicine admission IV fluids Hold anti-HTN medication Avoid hypotension Empiric IV abx for gut flora Strict I&O Repeat bmet, cbc, lactate in am Can have ice chips (<8oz per shift) o/w NPO Would NOT give systemic heparinization - no evidence of blood clot Syncopal workup  Not sure of etiology of cecal pneumatosis and portal venous gas. More than likely due to transient hypotension causing lack of adequate perfusion to intestine. Right now see no need for surgical intervention given no fever, tachycardia,  peritonitis, bowel wall thickening.  Will follow  Leighton Ruff. Redmond Pulling, MD, FACS General, Bariatric, & Minimally Invasive Surgery [facility]   Gayland Curry 05/07/2014, 9:06 PM

## 2014-05-07 NOTE — ED Notes (Signed)
Pt's 02 sats reading 90% on RA, pt placed on 2L 02, pt's 02 sats reading 98%.

## 2014-05-07 NOTE — ED Notes (Signed)
Admitting MD at BS.  

## 2014-05-07 NOTE — ED Notes (Addendum)
Pulses felt in bilateral arms and legs. Pt remains AO x4. Noted to be pale and mildly diaphoretic.

## 2014-05-07 NOTE — H&P (Signed)
Date: 05/07/2014               Patient Name:  Jack Wallace MRN: [id number]  DOB: 1961/04/28 Age / Sex: 53 y.o., male   PCP: No Pcp Per Patient         Medical Service: Internal Medicine Teaching Service         Attending Physician: Dr. Madilyn Fireman, MD    First Contact: Dr. Gordy Levan Pager: [phone]  Second Contact: Dr. Aundra Dubin Pager: [phone]       After Hours (After 5p/  First Contact Pager: [phone]  weekends / holidays): Second Contact Pager: [phone]   Chief Complaint: Syncope, abdominal pain  History of Present Illness: Jack Wallace is a 53 y.o. male w/ PMHx of HTN, HLD, DM type II, presents to the ED after an episode of syncope and abdominal pain. Patient claims he started having epigastric pain this afternoon around 2 PM that he describes as a cramping pain, 8/10 in severity. The patient denies any associated nausea, vomiting, fever, chills, diarrhea, hematemesis, melena, or hematochezia. He also denies ever having pain like this before. No recent sick contacts, no previous h/o DVT/PE or vascular issues in the past. Soon after his pain started, the patient claims he went to stand up and felt dizzy, and claims he lost consciousness. He denies any trauma involving the syncopal event. No bowel or bladder incontinence, no tongue biting. The patient admits to good po intake recently, saying his most recent meal was earlier in the day and he had drank pletny of fluids as well. Per EMS notes, patient was hypotensive in the ambulance, systolic in the 57'Q. On arrival to the ED, patient was given 1L NS bolus, systolic returning to the 110's. Patient also received CTA abdomen, showing pneumatosis of the cecum, as well as some mild portal venous gas. No abnormalities of the aorta or larger vessels.  On exam, patient was found to be pale and cool, and noted some worsening abdominal pain, more diffuse in nature, no signs of peritonitis. No bowel sounds heard. The patient denies any chest pain, SOB,  diaphoresis, or palpitations.  Meds: Current Facility-Administered Medications  Medication Dose Route Frequency Provider Last Rate Last Dose  . HYDROmorphone (DILAUDID) injection 1 mg  1 mg Intravenous Once Corlis Leak, MD       Current Outpatient Prescriptions  Medication Sig Dispense Refill  . ALPHA LIPOIC ACID PO Take 1 capsule by mouth 3 (three) times daily.      . Ascorbic Acid (VITAMIN C PO) Take 1 tablet by mouth daily.      Marland Kitchen aspirin 81 MG tablet Take 81 mg by mouth daily.      . Cholecalciferol (VITAMIN D PO) Take 1 tablet by mouth daily.      Marland Kitchen gabapentin (NEURONTIN) 600 MG tablet Take 1 tablet (600 mg total) by mouth 3 (three) times daily.  90 tablet  3  . glipiZIDE (GLUCOTROL) 5 MG tablet Take 1/2 tablet daily with breakfast  15 tablet  5  . Liraglutide 18 MG/3ML SOPN Inject 1.2 mg into the skin daily.  2 pen  5  . lisinopril-hydrochlorothiazide (PRINZIDE,ZESTORETIC) 20-25 MG per tablet Take 0.5 tablets by mouth daily.       . metFORMIN (GLUCOPHAGE) 500 MG tablet Take 1,000 mg by mouth 2 (two) times daily with a meal.      . pravastatin (PRAVACHOL) 40 MG tablet Take 40 mg by mouth every morning.  Allergies: Allergies as of 05/07/2014  . (No Known Allergies)   Past Medical History  Diagnosis Date  . Diabetes mellitus without complication   . Hypertension   . Hyperlipidemia    Past Surgical History  Procedure Laterality Date  . Nasal septum surgery     Family History  Problem Relation Age of Onset  . Hypertension Mother   . Diabetes Maternal Grandfather    History   Social History  . Marital Status: Married    Spouse Name: N/A    Number of Children: N/A  . Years of Education: N/A   Occupational History  . Not on file.   Social History Main Topics  . Smoking status: Never Smoker   . Smokeless tobacco: Never Used  . Alcohol Use: No  . Drug Use: No  . Sexual Activity: Not on file   Other Topics Concern  . Not on file   Social History Narrative   . No narrative on file    Review of Systems: General: Denies fever, chills, diaphoresis, appetite change and fatigue.  Respiratory: Denies SOB, DOE, cough, chest tightness, and wheezing.   Cardiovascular: Denies chest pain and palpitations.  Gastrointestinal: Positive for abdominal pain. Denies nausea, vomiting, diarrhea, constipation, blood in stool and abdominal distention.  Genitourinary: Denies dysuria, urgency, frequency, hematuria, and flank pain. Endocrine: Denies hot or cold intolerance, polyuria, and polydipsia. Musculoskeletal: Denies myalgias, back pain, joint swelling, arthralgias and gait problem.  Skin: Denies pallor, rash and wounds.  Neurological: Positive for dizziness, syncope. Denies seizures, weakness, lightheadedness, numbness and headaches.  Psychiatric/Behavioral: Denies mood changes, confusion, nervousness, sleep disturbance and agitation.   Physical Exam: Filed Vitals:   05/07/14 1730 05/07/14 1745 05/07/14 1800 05/07/14 1900  BP: 125/84 116/68 112/68 101/61  Pulse: 90 85 84 69  Resp: 19 11 11 15   Height:      Weight:      SpO2: 100% 100% 100% 98%  General: Vital signs reviewed.  Patient is an obese male, in no acute distress and cooperative with exam.  Head: Normocephalic and atraumatic. Eyes: PERRL, EOMI, conjunctivae normal, No scleral icterus.  Neck: Supple, trachea midline, normal ROM, No JVD, masses, thyromegaly, or carotid bruit present.  Cardiovascular: RRR, S1 normal, S2 normal, no murmurs, gallops, or rubs. Pulmonary/Chest: Air entry equal bilaterally, no wheezes, rales, or rhonchi. Abdominal: Soft, mildly tender , non-distended, BS +, no masses, organomegaly, or guarding present.  Musculoskeletal: No joint deformities, erythema, or stiffness, ROM full and nontender. Extremities: No swelling or edema,  pulses symmetric and intact bilaterally. No cyanosis or clubbing. Neurological: A&O x3, Strength is normal and symmetric bilaterally, cranial  nerve II-XII are grossly intact, no focal motor deficit, sensory intact to light touch bilaterally.  Skin: Warm, dry and intact. No rashes or erythema. Psychiatric: Normal mood and affect. speech and behavior is normal. Cognition and memory are normal.    Lab results: Basic Metabolic Panel:  Recent Labs  05/07/14 1601  NA 140  K 4.5  CL 104  CO2 21  GLUCOSE 123*  BUN 26*  CREATININE 2.06*  CALCIUM 9.1   Liver Function Tests:  Recent Labs  05/07/14 1601  AST 15  ALT 14  ALKPHOS 39  BILITOT 0.5  PROT 6.6  ALBUMIN 4.1    Recent Labs  05/07/14 1601  LIPASE 43   CBC:  Recent Labs  05/07/14 1601  WBC 12.0*  NEUTROABS 9.3*  HGB 12.4*  HCT 37.0*  MCV 88.7  PLT 198   BNP:  Recent Labs  05/07/14 1612  PROBNP 37.8   Coagulation:  Recent Labs  05/07/14 1601  LABPROT 13.0  INR 1.00    Imaging results:  Dg Chest Port 1 View  05/07/2014   CLINICAL DATA:  Dizziness, abdominal pain  EXAM: PORTABLE CHEST - 1 VIEW  COMPARISON:  None.  FINDINGS: The heart size and mediastinal contours are within normal limits. Both lungs are clear. The visualized skeletal structures are unremarkable.  IMPRESSION: No active disease.   Electronically Signed   By: Kathreen Devoid   On: 05/07/2014 16:40   Ct Angio Abd/pel W/ And/or W/o  05/07/2014   CLINICAL DATA:  Severe mid abdominal pain, hypotension, concern for abdominal dissection.  EXAM: CT ANGIOGRAPHY ABDOMEN AND PELVIS WITH CONTRAST AND WITHOUT CONTRAST  TECHNIQUE: Multidetector CT imaging of the abdomen and pelvis was performed using the standard protocol during bolus administration of intravenous contrast. Multiplanar reconstructed images including MIPs were obtained and reviewed to evaluate the vascular anatomy.  CONTRAST:  120mL OMNIPAQUE IOHEXOL 350 MG/ML SOLN  COMPARISON:  None.  FINDINGS: Vascular Findings:  Abdominal aorta: Normal caliber of the abdominal aorta. There is no significant atherosclerotic plaque within the  abdominal aorta. No abdominal aortic dissection or periaortic stranding.  Celiac artery: Widely patent without hemodynamically significant narrowing.  SMA: Widely patent without hemodynamically significant narrowing. Incidental note is made of a replaced right hepatic artery which arises from the proximal SMA. Note, evaluation of the mid/ distal aspect of the main trunk of the SMA is minimally degraded secondary to respiratory artifact. The distal tributaries of the SMA (particularly of the of supplying the right lower abdominal quadrant) are widely patent. There are no discrete filling defects to suggest distal embolism.  Right Renal artery: Solitary; widely patent; the right inferior phrenic artery is noted to arise from the proximal aspect of the right renal artery.  Left Renal artery: Solitary; widely patent without hemodynamically significant narrowing.  IMA: Widely patent  Pelvic vasculature: The bilateral common, external and internal iliac arteries are of normal caliber and widely patent without hemodynamically significant narrowing.  Review of the MIP images confirms the above findings.   --------------------------------------------------------------------------------  Nonvascular Findings:  Pneumatosis is seen within the wall of the cecum (representative axial images 124, 130, 136 and 143; sagittal image 24, series 7). There is air seen within multiple draining mesenteric veins (representative images 80, 81 104, 121 and 122, series 4). These findings are associated with a minimal amount of portal venous gas seen primarily within the nondependent portions of the left lobe of the liver (representative images 47 and 52). The etiology of this pneumatosis is not depicted on this examination. Specifically, there are no discrete filling defects within the SMA tributaries with supplying the cecum. No evidence of volvulus. No pneumoperitoneum. No definable/drainable intra-abdominal abscess. Normal appearance of the  appendix.  The bowel is otherwise normal in course and caliber without wall thickening or evidence of obstruction. Normal appearance of the terminal ileum. There is a moderate amount of liquid stool throughout the colon. No evidence of enteric obstruction.  Shotty porta hepatis lymph nodes are individually not enlarged by size criteria with index gastrohepatic lymph node measuring 1.1 cm in greatest short axis diameter (image 67, series 4). No retroperitoneal, mesenteric, pelvic or inguinal lymphadenopathy.  Normal hepatic contour. No discrete hepatic lesions. Normal appearance of the gallbladder given degree distention. No intra extrahepatic biliary dilatation. No ascites.  There is symmetric enhancement of the bilateral kidneys. No discrete renal lesions.  No definite renal stones on this postcontrast examination. There is a very minimal amount of grossly symmetric perinephric stranding. No urinary obstruction. Normal appearance of the bilateral adrenal glands, pancreas and spleen.  Scattered calcifications within a normal size prostate gland. Normal appearance of the urinary bladder given degree distention. No free fluid within the pelvis.  No acute or aggressive osseus abnormalities. Stigmata of DISH within the lower thoracic and upper lumbar spine.  Small mesenteric fat containing periumbilical hernia. There is a peripherally calcified subcutaneous nodule within the pannus of the left lower abdomen, likely a location of fat necrosis. Small mesenteric fat containing inguinal hernias.  IMPRESSION: 1. Pneumatosis involving the cecum with associated air within the draining mesenteric veins and small amount of portal venous gas. The etiology of this pneumatosis is not depicted as examination. Specifically, there is no evidence of abdominal aortic aneurysm or distal embolism affecting the distal tributaries of the SMA supplying the cecum and there is no evidence of cecal volvulus. Further evaluation could be performed  with cardiac echo to evaluate for a potential cardiogenic source of a potential resolved distal embolism as clinically indicated. 2. No evidence of abdominal aortic aneurysm or significant atherosclerotic plaque affecting the abdominal aorta.  Critical Value/emergent results were called by telephone at the time of interpretation on 05/07/2014 at 5:02 PM to Dr. Elnora Morrison , who verbally acknowledged these results.   Electronically Signed   By: Sandi Mariscal M.D.   On: 05/07/2014 17:21    Other results: EKG: NSR @ 82 bpm. Incomplete RBBB. Questionable Q-waves in inferior leads  Assessment & Plan by Problem: Mr. LEANNE BUCKERT is a 53 y.o. male w/ PMHx of HTN, HLD, DM type II, admitted for syncope and pneumatosis seen on CT.   Syncope- Etiology unclear at this time. Patient admits to dizziness w/ standing earlier today, resulting in episode of syncope. Patient claims he has never had anything like this before. No bowel or bladder incontinence or tongue biting, seizure very unlikely at this time. Patient also denies any recent palpitations and did have pre-syncopal symptoms prior to episode, therefore cardiogenic source unlikely as well. Presented w/ Hb of 12.4, however, baseline unknown at this time. Per EMS notes, patient w/ HoTN on transport, systolic in the 0000000. Given 1L NS in the ED w/ systolic return to the [id number]. Some question of acute blood loss, however, FOBT negative, lactic acid 1.33 on admission. CTA abdomen also negative for AAA, rupture, or dissection. Further CT findings discussed below. Patient denies poor po intake, claiming he was eating and drinking earlier in the day. Denies any recent fatigue or lethargy lately.  -Admit to stepdown -IVF's; Patient has received 2L bolus; will give 1 more L, then NS @ 125 ml/hr -Orthostatic vitals -CBC's q8h -Troponins x3 pending -ECHO in AM -Repeat EKG in AM  Abdominal Pain w/ Pneumatosis on CT- Patient w/ epigastric pain earlier today, followed by  episode of syncope. Claims pain was cramping in nature, 8/10 in severity. CT abdomen performed in ED showed pneumatosis involving the cecum with associated air within the draining mesenteric veins and small amount of portal venous gas, but w/ no evidence of AAA or distal embolism affecting the distal tributaries of the SMA supplying the cecum and no evidence of cecal volvulus. Also, no evidence of significant atherosclerotic plaque affecting the abdominal aorta. Patient denies any recent abdominal pain prior to today, no recent illness, nausea, vomiting, fever, chills, diarrhea, hematochezia, or melena. Surgery consulted in the ED,  feel that there is no need for surgical intervention at this time given no fever, tachycardia, or signs of peritonitis. Lactic acid 1.33. Pneumatosis most likely 2/2 HoTN of unknown etiology.  -ABx; Zosyn + Flagyl IV -Blood cultures pending -Repeat lactic acid in AM -I/O's -NPO (ice chips) -NO Heparin for now -C. Diff pending  AKI- Patient w/ Cr of 2.06 on admission, baseline unknown, however, likely lower given hypotensive insult. Therefore, pre-renal AKI suspected at this time.  -Repeat BMP in AM -IVF's as above -Hold home Prinzide   HTN- Patient on Lisinopril-HCTZ 20-25 at home. Patient Hypotensive on admission. -Hold home meds for now -IVF's  HLD- On Pravachol 40 mg qhs at home. -Hold Pravastatin for now in the setting of AKI  DM Type II w/ Neuropathy- HbA1c unknown at this time. Takes Metformin 1000 mg bid + Liraglutide + Glipizide 2.5 mg qd at home. -Hold home meds -ISS-M w/ CBG's q4h -Continue Neurontin -HbA1c pending  DVT/PE PPx- SCD's  Dispo: Disposition is deferred at this time, awaiting improvement of current medical problems. Anticipated discharge in approximately 2-3 day(s).   The patient does not have a current PCP (No Pcp Per Patient) and does not need an [facility] hospital follow-up appointment after discharge.  The patient does not have  transportation limitations that hinder transportation to clinic appointments.  Signed: Corky Sox, MD 05/07/2014, 7:08 PM

## 2014-05-07 NOTE — Progress Notes (Signed)
In brief review of chart and labs, i would not recommend putting pt on heparin drip. Agree with IV abx. i will see pt shortly and Full consult to follow  Jack Wallace. Redmond Pulling, MD, FACS General, Bariatric, & Minimally Invasive Surgery [facility], [place]

## 2014-05-07 NOTE — ED Provider Notes (Signed)
CSN: [id number]     Arrival date & time 05/07/14  1548 History   First MD Initiated Contact with Patient 05/07/14 1550     Chief Complaint  Patient presents with  . Hypotension  . Loss of Consciousness     Patient is a 53 year old male with past medical history of diabetes, hypertension, hyperlipidemia who presents with abdominal pain and syncope. Patient reports around 2 PM this afternoon he began having epigastric abdominal pain. Pain was nonradiating and moderate in severity. While at work he had multiple syncopal episodes. EMS was contacted. Upon arrival patient was noted to be hypotensive with blood pressure noted 60 over palp. He was given 500 cc of normal saline and blood pressure increased to 90/60. Blood glucose with EMS was 116. Patient denies recent fevers, diarrhea, chest pain, shortness of breath, or other symptoms.   (Consider location/radiation/quality/duration/timing/severity/associated sxs/prior Treatment) Patient is a 53 y.o. male presenting with abdominal pain. The history is provided by the patient and medical records. No language interpreter was used.  Abdominal Pain Pain location:  Epigastric Pain quality: aching   Pain radiates to:  Does not radiate Pain severity:  Severe Onset quality:  Gradual Timing:  Constant Progression:  Worsening Chronicity:  New   Past Medical History  Diagnosis Date  . Diabetes mellitus without complication   . Hypertension   . Hyperlipidemia    Past Surgical History  Procedure Laterality Date  . Nasal septum surgery     Family History  Problem Relation Age of Onset  . Hypertension Mother   . Diabetes Maternal Grandfather    History  Substance Use Topics  . Smoking status: Never Smoker   . Smokeless tobacco: Never Used  . Alcohol Use: Not on file    Review of Systems  Gastrointestinal: Positive for abdominal pain.  All other systems reviewed and are negative.     Allergies  Review of patient's allergies indicates  no known allergies.  Home Medications   Prior to Admission medications   Medication Sig Start Date End Date Taking? Authorizing Provider  gabapentin (NEURONTIN) 600 MG tablet Take 1 tablet (600 mg total) by mouth 3 (three) times daily. 02/19/14   Elayne Snare, MD  glipiZIDE (GLUCOTROL) 5 MG tablet Take 1/2 tablet daily with breakfast 02/19/14   Elayne Snare, MD  glucose blood (ONETOUCH VERIO) test strip Use as instructed to check blood sugars 2 times a day 09/22/13   Elayne Snare, MD  Insulin Pen Needle 32G X 5 MM MISC 1 each by Does not apply route daily. 10/06/13   Elayne Snare, MD  Liraglutide 18 MG/3ML SOPN Inject 1.2 mg into the skin daily. 02/19/14   Elayne Snare, MD  lisinopril-hydrochlorothiazide (PRINZIDE,ZESTORETIC) 20-25 MG per tablet Take 1 tablet by mouth daily. 1/2    Historical Provider, MD  metFORMIN (GLUCOPHAGE) 500 MG tablet 2 tabs twice daily 02/19/14   Elayne Snare, MD  [facility] DELICA LANCETS FINE MISC 1 each by Does not apply route 2 (two) times daily. 09/22/13   Elayne Snare, MD  pravastatin (PRAVACHOL) 40 MG tablet Take 40 mg by mouth daily.    Historical Provider, MD   BP 98/53  Pulse 93  Temp(Src) 97.6 F (36.4 C) (Oral)  Resp 15  Ht 6' 0.83" (1.85 m)  Wt 278 lb 10.6 oz (126.4 kg)  BMI 36.93 kg/m2  SpO2 97% Physical Exam  Nursing note and vitals reviewed. Constitutional: He is oriented to person, place, and time. He appears well-developed and well-nourished.  Pale  appearing   HENT:  Head: Normocephalic and atraumatic.  Right Ear: External ear normal.  Left Ear: External ear normal.  Eyes: Conjunctivae are normal. Pupils are equal, round, and reactive to light.  Neck: Normal range of motion. Neck supple.  Cardiovascular: Normal rate, normal heart sounds and intact distal pulses.   Pulmonary/Chest: Effort normal and breath sounds normal.  Abdominal: Soft. Bowel sounds are normal. He exhibits no distension. There is tenderness (mild epigatric). There is no rebound and no  guarding.  Musculoskeletal: Normal range of motion. He exhibits no edema and no tenderness.  Neurological: He is alert and oriented to person, place, and time.  Skin: Skin is warm and dry.  Psychiatric: He has a normal mood and affect.    ED Course  Procedures (including critical care time) Labs Review Labs Reviewed  CBC WITH DIFFERENTIAL - Abnormal; Notable for the following:    WBC 12.0 (*)    RBC 4.17 (*)    Hemoglobin 12.4 (*)    HCT 37.0 (*)    Neutro Abs 9.3 (*)    All other components within normal limits  COMPREHENSIVE METABOLIC PANEL - Abnormal; Notable for the following:    Glucose, Bld 123 (*)    BUN 26 (*)    Creatinine, Ser 2.06 (*)    GFR calc non Af Amer 35 (*)    GFR calc Af Amer 41 (*)    All other components within normal limits  PROTIME-INR  LIPASE, BLOOD  PRO B NATRIURETIC PEPTIDE  URINALYSIS, ROUTINE W REFLEX MICROSCOPIC  TROPONIN I  I-STAT TROPOININ, ED  I-STAT CG4 LACTIC ACID, ED  POC OCCULT BLOOD, ED  TYPE AND SCREEN  ABO/RH    Imaging Review Dg Chest Port 1 View  05/07/2014   CLINICAL DATA:  Dizziness, abdominal pain  EXAM: PORTABLE CHEST - 1 VIEW  COMPARISON:  None.  FINDINGS: The heart size and mediastinal contours are within normal limits. Both lungs are clear. The visualized skeletal structures are unremarkable.  IMPRESSION: No active disease.   Electronically Signed   By: Kathreen Devoid   On: 05/07/2014 16:40   Ct Angio Abd/pel W/ And/or W/o  05/07/2014   CLINICAL DATA:  Severe mid abdominal pain, hypotension, concern for abdominal dissection.  EXAM: CT ANGIOGRAPHY ABDOMEN AND PELVIS WITH CONTRAST AND WITHOUT CONTRAST  TECHNIQUE: Multidetector CT imaging of the abdomen and pelvis was performed using the standard protocol during bolus administration of intravenous contrast. Multiplanar reconstructed images including MIPs were obtained and reviewed to evaluate the vascular anatomy.  CONTRAST:  165mL OMNIPAQUE IOHEXOL 350 MG/ML SOLN  COMPARISON:   None.  FINDINGS: Vascular Findings:  Abdominal aorta: Normal caliber of the abdominal aorta. There is no significant atherosclerotic plaque within the abdominal aorta. No abdominal aortic dissection or periaortic stranding.  Celiac artery: Widely patent without hemodynamically significant narrowing.  SMA: Widely patent without hemodynamically significant narrowing. Incidental note is made of a replaced right hepatic artery which arises from the proximal SMA. Note, evaluation of the mid/ distal aspect of the main trunk of the SMA is minimally degraded secondary to respiratory artifact. The distal tributaries of the SMA (particularly of the of supplying the right lower abdominal quadrant) are widely patent. There are no discrete filling defects to suggest distal embolism.  Right Renal artery: Solitary; widely patent; the right inferior phrenic artery is noted to arise from the proximal aspect of the right renal artery.  Left Renal artery: Solitary; widely patent without hemodynamically significant narrowing.  IMA:  Widely patent  Pelvic vasculature: The bilateral common, external and internal iliac arteries are of normal caliber and widely patent without hemodynamically significant narrowing.  Review of the MIP images confirms the above findings.   --------------------------------------------------------------------------------  Nonvascular Findings:  Pneumatosis is seen within the wall of the cecum (representative axial images 124, 130, 136 and 143; sagittal image 24, series 7). There is air seen within multiple draining mesenteric veins (representative images 80, 81 104, 121 and 122, series 4). These findings are associated with a minimal amount of portal venous gas seen primarily within the nondependent portions of the left lobe of the liver (representative images 47 and 52). The etiology of this pneumatosis is not depicted on this examination. Specifically, there are no discrete filling defects within the SMA  tributaries with supplying the cecum. No evidence of volvulus. No pneumoperitoneum. No definable/drainable intra-abdominal abscess. Normal appearance of the appendix.  The bowel is otherwise normal in course and caliber without wall thickening or evidence of obstruction. Normal appearance of the terminal ileum. There is a moderate amount of liquid stool throughout the colon. No evidence of enteric obstruction.  Shotty porta hepatis lymph nodes are individually not enlarged by size criteria with index gastrohepatic lymph node measuring 1.1 cm in greatest short axis diameter (image 67, series 4). No retroperitoneal, mesenteric, pelvic or inguinal lymphadenopathy.  Normal hepatic contour. No discrete hepatic lesions. Normal appearance of the gallbladder given degree distention. No intra extrahepatic biliary dilatation. No ascites.  There is symmetric enhancement of the bilateral kidneys. No discrete renal lesions. No definite renal stones on this postcontrast examination. There is a very minimal amount of grossly symmetric perinephric stranding. No urinary obstruction. Normal appearance of the bilateral adrenal glands, pancreas and spleen.  Scattered calcifications within a normal size prostate gland. Normal appearance of the urinary bladder given degree distention. No free fluid within the pelvis.  No acute or aggressive osseus abnormalities. Stigmata of DISH within the lower thoracic and upper lumbar spine.  Small mesenteric fat containing periumbilical hernia. There is a peripherally calcified subcutaneous nodule within the pannus of the left lower abdomen, likely a location of fat necrosis. Small mesenteric fat containing inguinal hernias.  IMPRESSION: 1. Pneumatosis involving the cecum with associated air within the draining mesenteric veins and small amount of portal venous gas. The etiology of this pneumatosis is not depicted as examination. Specifically, there is no evidence of abdominal aortic aneurysm or  distal embolism affecting the distal tributaries of the SMA supplying the cecum and there is no evidence of cecal volvulus. Further evaluation could be performed with cardiac echo to evaluate for a potential cardiogenic source of a potential resolved distal embolism as clinically indicated. 2. No evidence of abdominal aortic aneurysm or significant atherosclerotic plaque affecting the abdominal aorta.  Critical Value/emergent results were called by telephone at the time of interpretation on 05/07/2014 at 5:02 PM to Dr. Elnora Morrison , who verbally acknowledged these results.   Electronically Signed   By: Sandi Mariscal M.D.   On: 05/07/2014 17:21     EKG Interpretation   Date/Time:  Monday May 07 2014 15:59:33 EDT Ventricular Rate:  82 PR Interval:  177 QRS Duration: 101 QT Interval:  367 QTC Calculation: 429 R Axis:   61 Text Interpretation:  Sinus rhythm RSR' in V1 or V2, right VCD or RVH  Abnormal inferior Q waves Borderline ST elevation, inferior leads  Confirmed by ZAVITZ  MD, JOSHUA ([id number]) on 05/07/2014 4:12:03 PM  MDM   Final diagnoses:  Pneumatosis of intestines  Syncope  Hypotension  Diabetic neuropathy  Other and unspecified hyperlipidemia  Type II or unspecified type diabetes mellitus without mention of complication, uncontrolled      Patient presents to the emergency department after having syncope and abdominal pain. Per EMS, patient was found to be hypotensive into the 0000000 systolic. He was given IV fluids and upon arrival blood pressure of 90s over 50s. Physical exam as above and remarkable for mild epigastric tenderness to palpation and overall patient appeared pale. Bedside ultrasound limited by body habitus but there was no evidence of intra-abdominal free fluid or AAA. Given concern for intra-abdominal pathology CTA was completed. Review results shows the patient has evidence of pneumatosis. He was given IV fluids in the emergency department and his blood pressures  responded with systolics in the [id number]. Review of the labs shows the patient has acute kidney injury, but no anemia, no lactic acidosis or other significant abnormality requiring acute intervention. Given clinical presentation and CT findings it was felt that admission was warranted. Will treat with Zosyn, and consult general surgery for recommendations.  Patient had intermittent bouts of BPs of 80s/50s but with IVF BP stabilized and patient admitted without acute events.    Corlis Leak, MD 05/08/14 [phone]

## 2014-05-07 NOTE — ED Notes (Addendum)
Contacted CT about bringing pt to top of list. This RN will accompany pt to CT. Per Md Zavitz, pt is okay to go to CT without creatine result.

## 2014-05-07 NOTE — ED Notes (Signed)
Per EMS: Pt from work with c/o central abdominal pain starting today. States he passed out multiple times today. On arrival, pt unable to stand. Noted to be pale, diaphoretic, BP 60 systolic. Pt in NSR. Given 500 mL bolus increasing BP to 90/60. CBG 116. AO x4.

## 2014-05-07 NOTE — Consult Note (Signed)
ANTICOAGULATION CONSULT NOTE - Initial Consult  Pharmacy Consult for Heparin Indication: mesenteric ischemia  No Known Allergies  Patient Measurements: Height: 6' 0.83" (185 cm) Weight: 278 lb 10.6 oz (126.4 kg) IBW/kg (Calculated) : 79.52 Heparin Dosing Weight: 107kg  Vital Signs: BP: 115/69 mmHg (05/18 1642) Pulse Rate: 91 (05/18 1642)  Labs:  Recent Labs  05/07/14 1601  HGB 12.4*  HCT 37.0*  PLT 198  LABPROT 13.0  INR 1.00  CREATININE 2.06*    Estimated Creatinine Clearance: 57.7 ml/min (by C-G formula based on Cr of 2.06).   Medical History: Past Medical History  Diagnosis Date  . Diabetes mellitus without complication   . Hypertension   . Hyperlipidemia     Medications:  No anticoagulants pta  Assessment: 52yom presents with hypotension and abdominal pain. CT angio of abdomen/pelvis shows pneumatosis involving the cecum and draining mesenteric veins. Etiology is unclear but potentially caused by a resolved distal embolism. He will begin IV heparin. Baseline CBC wnl. He is in acute renal failure with sCr elevated at 2.  Goal of Therapy:  Heparin level 0.3-0.7 units/ml Monitor platelets by anticoagulation protocol: Yes   Plan:  1) Heparin bolus 5000 units x 1 2) Heparin drip at 1700 units/hr 3) Check 6 hour heparin level 4) Daily heparin level and CBC  Jack Wallace 05/07/2014,5:31 PM

## 2014-05-07 NOTE — ED Notes (Signed)
CBG 125 °

## 2014-05-08 ENCOUNTER — Encounter (HOSPITAL_COMMUNITY): Payer: Self-pay | Admitting: *Deleted

## 2014-05-08 DIAGNOSIS — E785 Hyperlipidemia, unspecified: Secondary | ICD-10-CM

## 2014-05-08 DIAGNOSIS — E1149 Type 2 diabetes mellitus with other diabetic neurological complication: Secondary | ICD-10-CM

## 2014-05-08 DIAGNOSIS — R55 Syncope and collapse: Secondary | ICD-10-CM

## 2014-05-08 DIAGNOSIS — I517 Cardiomegaly: Secondary | ICD-10-CM

## 2014-05-08 DIAGNOSIS — I10 Essential (primary) hypertension: Secondary | ICD-10-CM

## 2014-05-08 DIAGNOSIS — N179 Acute kidney failure, unspecified: Secondary | ICD-10-CM

## 2014-05-08 DIAGNOSIS — K6389 Other specified diseases of intestine: Secondary | ICD-10-CM

## 2014-05-08 DIAGNOSIS — K668 Other specified disorders of peritoneum: Secondary | ICD-10-CM

## 2014-05-08 DIAGNOSIS — E1142 Type 2 diabetes mellitus with diabetic polyneuropathy: Secondary | ICD-10-CM

## 2014-05-08 LAB — CBC
HCT: 36.9 % — ABNORMAL LOW (ref 39.0–52.0)
HCT: 35.9 % — ABNORMAL LOW (ref 39.0–52.0)
HEMATOCRIT: 36 % — AB (ref 39.0–52.0)
HEMOGLOBIN: 12 g/dL — AB (ref 13.0–17.0)
Hemoglobin: 12.4 g/dL — ABNORMAL LOW (ref 13.0–17.0)
Hemoglobin: 12.1 g/dL — ABNORMAL LOW (ref 13.0–17.0)
MCH: 29.5 pg (ref 26.0–34.0)
MCH: 29.5 pg (ref 26.0–34.0)
MCH: 29.1 pg (ref 26.0–34.0)
MCHC: 33.6 g/dL (ref 30.0–36.0)
MCHC: 33.6 g/dL (ref 30.0–36.0)
MCHC: 33.4 g/dL (ref 30.0–36.0)
MCV: 87.6 fL (ref 78.0–100.0)
MCV: 87.8 fL (ref 78.0–100.0)
MCV: 87.1 fL (ref 78.0–100.0)
PLATELETS: 207 10*3/uL (ref 150–400)
Platelets: 207 10*3/uL (ref 150–400)
Platelets: 201 10*3/uL (ref 150–400)
RBC: 4.21 MIL/uL — AB (ref 4.22–5.81)
RBC: 4.1 MIL/uL — ABNORMAL LOW (ref 4.22–5.81)
RBC: 4.12 MIL/uL — AB (ref 4.22–5.81)
RDW: 12.9 % (ref 11.5–15.5)
RDW: 13 % (ref 11.5–15.5)
RDW: 13 % (ref 11.5–15.5)
WBC: 13.2 10*3/uL — ABNORMAL HIGH (ref 4.0–10.5)
WBC: 13.5 10*3/uL — AB (ref 4.0–10.5)
WBC: 13.7 10*3/uL — AB (ref 4.0–10.5)

## 2014-05-08 LAB — HEMOGLOBIN A1C
Hgb A1c MFr Bld: 6.5 % — ABNORMAL HIGH (ref <5.7)
Mean Plasma Glucose: 140 mg/dL — ABNORMAL HIGH (ref <117)

## 2014-05-08 LAB — GLUCOSE, CAPILLARY
GLUCOSE-CAPILLARY: 151 mg/dL — AB (ref 70–99)
GLUCOSE-CAPILLARY: 109 mg/dL — AB (ref 70–99)
GLUCOSE-CAPILLARY: 100 mg/dL — AB (ref 70–99)
Glucose-Capillary: 106 mg/dL — ABNORMAL HIGH (ref 70–99)
Glucose-Capillary: 121 mg/dL — ABNORMAL HIGH (ref 70–99)
Glucose-Capillary: 127 mg/dL — ABNORMAL HIGH (ref 70–99)
Glucose-Capillary: 167 mg/dL — ABNORMAL HIGH (ref 70–99)

## 2014-05-08 LAB — BASIC METABOLIC PANEL
BUN: 23 mg/dL (ref 6–23)
CO2: 21 mEq/L (ref 19–32)
Calcium: 8.4 mg/dL (ref 8.4–10.5)
Chloride: 105 mEq/L (ref 96–112)
Creatinine, Ser: 1.18 mg/dL (ref 0.50–1.35)
GFR calc Af Amer: 80 mL/min — ABNORMAL LOW (ref >90)
GFR, EST NON AFRICAN AMERICAN: 69 mL/min — AB (ref >90)
Glucose, Bld: 166 mg/dL — ABNORMAL HIGH (ref 70–99)
Potassium: 3.5 mEq/L — ABNORMAL LOW (ref 3.7–5.3)
SODIUM: 140 meq/L (ref 137–147)

## 2014-05-08 LAB — AMYLASE: Amylase: 34 U/L (ref 0–105)

## 2014-05-08 LAB — LACTIC ACID, PLASMA
LACTIC ACID, VENOUS: 2 mmol/L (ref 0.5–2.2)
Lactic Acid, Venous: 1.1 mmol/L (ref 0.5–2.2)

## 2014-05-08 LAB — TROPONIN I: Troponin I: <0.3 ng/mL (ref <0.30)

## 2014-05-08 LAB — MRSA PCR SCREENING: MRSA BY PCR: NEGATIVE

## 2014-05-08 MED ORDER — SODIUM CHLORIDE 0.9 % IV SOLN
INTRAVENOUS | Status: DC
  Administered 2014-05-08 (×2): via INTRAVENOUS

## 2014-05-08 MED ORDER — HYDROMORPHONE HCL PF 1 MG/ML IJ SOLN
0.5000 mg | INTRAMUSCULAR | Status: DC | PRN
  Administered 2014-05-08 – 2014-05-15 (×23): 1 mg via INTRAVENOUS
  Filled 2014-05-08 (×24): qty 1

## 2014-05-08 MED ORDER — ONDANSETRON HCL 4 MG PO TABS: 4.0000 mg | ORAL_TABLET | Freq: Four times a day (QID) | ORAL | Status: DC | PRN

## 2014-05-08 MED ORDER — ONDANSETRON HCL 4 MG/2ML IJ SOLN
4.0000 mg | Freq: Four times a day (QID) | INTRAMUSCULAR | Status: DC | PRN
  Administered 2014-05-09 – 2014-05-14 (×5): 4 mg via INTRAVENOUS
  Filled 2014-05-08 (×5): qty 2

## 2014-05-08 MED ORDER — POTASSIUM CHLORIDE 10 MEQ/100ML IV SOLN
10.0000 meq | INTRAVENOUS | Status: AC
  Administered 2014-05-08 (×2): 10 meq via INTRAVENOUS
  Filled 2014-05-08: qty 100

## 2014-05-08 MED ORDER — CIPROFLOXACIN IN D5W 400 MG/200ML IV SOLN
400.0000 mg | Freq: Two times a day (BID) | INTRAVENOUS | Status: DC
  Administered 2014-05-08 – 2014-05-14 (×14): 400 mg via INTRAVENOUS
  Filled 2014-05-08 (×18): qty 200

## 2014-05-08 NOTE — Progress Notes (Signed)
Abdomen is quite soft with good bowel sounds Mild RLQ tenderness  Transient ischemia from ?hypotension - no peritoneal signs. No indication for surgery at this time.  Will follow.  Imogene Burn. Georgette Dover, MD, [facility] Surgery  General/ Trauma Surgery  05/08/2014 1:05 PM

## 2014-05-08 NOTE — Progress Notes (Signed)
Utilization review completed.  

## 2014-05-08 NOTE — Progress Notes (Signed)
Subjective: Pt doesn't feel well, c/o pain in abdomen.  No N/V.  Doesn't understand why he's here.    Objective: Vital signs in last 24 hours: Temp:  [97.6 F (36.4 C)-99.4 F (37.4 C)] 99.4 F (37.4 C) (05/19 0309) Pulse Rate:  [67-110] 110 (05/19 0309) Resp:  [10-19] 14 (05/19 0309) BP: (70-125)/(40-84) 108/61 mmHg (05/19 0309) SpO2:  [94 %-100 %] 97 % (05/19 0309) Weight:  [275 lb 12.7 oz (125.1 kg)-278 lb 10.6 oz (126.4 kg)] 275 lb 12.7 oz (125.1 kg) (05/19 0020) Last BM Date: 05/06/14  Intake/Output from previous day: 05/18 0701 - 05/19 0700 In: 3400 [I.V.:3400] Out: 1200 [Urine:1200] Intake/Output this shift:    PE: Gen:  Alert, NAD, pleasant, sleepy Card:  RRR, no M/G/R heard but body habitus makes it hard to hear Pulm:  CTA, no W/R/R Abd: Soft, moderate tenderness in RLQ, no rebound or guarding, mild distension, +BS, no HSM Ext:  B/l LE edema +1  Lab Results:   Recent Labs  05/07/14 1601 05/08/14 0440  WBC 12.0* 13.2*  HGB 12.4* 12.4*  HCT 37.0* 36.9*  PLT 198 207   BMET  Recent Labs  05/07/14 1601 05/08/14 0440  NA 140 140  K 4.5 3.5*  CL 104 105  CO2 21 21  GLUCOSE 123* 166*  BUN 26* 23  CREATININE 2.06* 1.18  CALCIUM 9.1 8.4   PT/INR  Recent Labs  05/07/14 1601  LABPROT 13.0  INR 1.00   CMP     Component Value Date/Time   NA 140 05/08/2014 0440   K 3.5* 05/08/2014 0440   CL 105 05/08/2014 0440   CO2 21 05/08/2014 0440   GLUCOSE 166* 05/08/2014 0440   BUN 23 05/08/2014 0440   CREATININE 1.18 05/08/2014 0440   CALCIUM 8.4 05/08/2014 0440   PROT 6.6 05/07/2014 1601   ALBUMIN 4.1 05/07/2014 1601   AST 15 05/07/2014 1601   ALT 14 05/07/2014 1601   ALKPHOS 39 05/07/2014 1601   BILITOT 0.5 05/07/2014 1601   GFRNONAA 69* 05/08/2014 0440   GFRAA 80* 05/08/2014 0440   Lipase     Component Value Date/Time   LIPASE 43 05/07/2014 1601       Studies/Results: Dg Chest Port 1 View  05/07/2014   CLINICAL DATA:  Dizziness, abdominal pain   EXAM: PORTABLE CHEST - 1 VIEW  COMPARISON:  None.  FINDINGS: The heart size and mediastinal contours are within normal limits. Both lungs are clear. The visualized skeletal structures are unremarkable.  IMPRESSION: No active disease.   Electronically Signed   By: Kathreen Devoid   On: 05/07/2014 16:40   Ct Angio Abd/pel W/ And/or W/o  05/07/2014   CLINICAL DATA:  Severe mid abdominal pain, hypotension, concern for abdominal dissection.  EXAM: CT ANGIOGRAPHY ABDOMEN AND PELVIS WITH CONTRAST AND WITHOUT CONTRAST  TECHNIQUE: Multidetector CT imaging of the abdomen and pelvis was performed using the standard protocol during bolus administration of intravenous contrast. Multiplanar reconstructed images including MIPs were obtained and reviewed to evaluate the vascular anatomy.  CONTRAST:  136mL OMNIPAQUE IOHEXOL 350 MG/ML SOLN  COMPARISON:  None.  FINDINGS: Vascular Findings:  Abdominal aorta: Normal caliber of the abdominal aorta. There is no significant atherosclerotic plaque within the abdominal aorta. No abdominal aortic dissection or periaortic stranding.  Celiac artery: Widely patent without hemodynamically significant narrowing.  SMA: Widely patent without hemodynamically significant narrowing. Incidental note is made of a replaced right hepatic artery which arises from the proximal SMA. Note, evaluation of  the mid/ distal aspect of the main trunk of the SMA is minimally degraded secondary to respiratory artifact. The distal tributaries of the SMA (particularly of the of supplying the right lower abdominal quadrant) are widely patent. There are no discrete filling defects to suggest distal embolism.  Right Renal artery: Solitary; widely patent; the right inferior phrenic artery is noted to arise from the proximal aspect of the right renal artery.  Left Renal artery: Solitary; widely patent without hemodynamically significant narrowing.  IMA: Widely patent  Pelvic vasculature: The bilateral common, external and  internal iliac arteries are of normal caliber and widely patent without hemodynamically significant narrowing.  Review of the MIP images confirms the above findings.   --------------------------------------------------------------------------------  Nonvascular Findings:  Pneumatosis is seen within the wall of the cecum (representative axial images 124, 130, 136 and 143; sagittal image 24, series 7). There is air seen within multiple draining mesenteric veins (representative images 80, 81 104, 121 and 122, series 4). These findings are associated with a minimal amount of portal venous gas seen primarily within the nondependent portions of the left lobe of the liver (representative images 47 and 52). The etiology of this pneumatosis is not depicted on this examination. Specifically, there are no discrete filling defects within the SMA tributaries with supplying the cecum. No evidence of volvulus. No pneumoperitoneum. No definable/drainable intra-abdominal abscess. Normal appearance of the appendix.  The bowel is otherwise normal in course and caliber without wall thickening or evidence of obstruction. Normal appearance of the terminal ileum. There is a moderate amount of liquid stool throughout the colon. No evidence of enteric obstruction.  Shotty porta hepatis lymph nodes are individually not enlarged by size criteria with index gastrohepatic lymph node measuring 1.1 cm in greatest short axis diameter (image 67, series 4). No retroperitoneal, mesenteric, pelvic or inguinal lymphadenopathy.  Normal hepatic contour. No discrete hepatic lesions. Normal appearance of the gallbladder given degree distention. No intra extrahepatic biliary dilatation. No ascites.  There is symmetric enhancement of the bilateral kidneys. No discrete renal lesions. No definite renal stones on this postcontrast examination. There is a very minimal amount of grossly symmetric perinephric stranding. No urinary obstruction. Normal appearance of  the bilateral adrenal glands, pancreas and spleen.  Scattered calcifications within a normal size prostate gland. Normal appearance of the urinary bladder given degree distention. No free fluid within the pelvis.  No acute or aggressive osseus abnormalities. Stigmata of DISH within the lower thoracic and upper lumbar spine.  Small mesenteric fat containing periumbilical hernia. There is a peripherally calcified subcutaneous nodule within the pannus of the left lower abdomen, likely a location of fat necrosis. Small mesenteric fat containing inguinal hernias.  IMPRESSION: 1. Pneumatosis involving the cecum with associated air within the draining mesenteric veins and small amount of portal venous gas. The etiology of this pneumatosis is not depicted as examination. Specifically, there is no evidence of abdominal aortic aneurysm or distal embolism affecting the distal tributaries of the SMA supplying the cecum and there is no evidence of cecal volvulus. Further evaluation could be performed with cardiac echo to evaluate for a potential cardiogenic source of a potential resolved distal embolism as clinically indicated. 2. No evidence of abdominal aortic aneurysm or significant atherosclerotic plaque affecting the abdominal aorta.  Critical Value/emergent results were called by telephone at the time of interpretation on 05/07/2014 at 5:02 PM to Dr. Elnora Morrison , who verbally acknowledged these results.   Electronically Signed   By: Eldridge Abrahams.D.  On: 05/07/2014 17:21    Anti-infectives: Anti-infectives   Start     Dose/Rate Route Frequency Ordered Stop   05/07/14 2130  metroNIDAZOLE (FLAGYL) IVPB 500 mg     500 mg 100 mL/hr over 60 Minutes Intravenous Every 8 hours 05/07/14 2129     05/07/14 1730  piperacillin-tazobactam (ZOSYN) IVPB 3.375 g     3.375 g 100 mL/hr over 30 Minutes Intravenous  Once 05/07/14 1721 05/07/14 1952       Assessment/Plan Syncopal episode  Hypotension  Acute kidney injury   Cecal pneumatosis  PV gas  Right sided abdominal pain  Morbid obesity  H/o HTN  DM 2  HLD  Plan: -IV fluids, bowel rest, pain control, antiemetics -Hold anti-HTN medication, Avoid hypotension  -Empiric IV abx for gut flora (had one dose of zosyn in ED, start Cipro added to flagyl Day #1) -Strict I&O  -Repeat bmet, cbc, lactate in am  -Can have ice chips (<8oz per shift) o/w NPO  -Would NOT give systemic heparinization - no evidence of blood clot  -Syncopal workup  -Not sure of etiology of cecal pneumatosis and portal venous gas. More than likely due to transient hypotension causing lack of adequate perfusion to intestine. Right now see no need for surgical intervention given no fever, tachycardia, peritonitis, bowel wall thickening. -Will follow, labs pending     LOS: 1 day    Coralie Keens 05/08/2014, 8:46 AM Pager: [phone]

## 2014-05-08 NOTE — ED Provider Notes (Signed)
Medical screening examination/treatment/procedure(s) were conducted as a shared visit with non-physician practitioner(s) or resident and myself. I personally evaluated the patient during the encounter and agree with the findings and plan unless otherwise indicated.  I have personally reviewed any xrays and/ or EKG's with the provider and I agree with interpretation.  53 year old male with diabetes, high blood pressure and hyperlipidemia history presents with worsening abdominal pain since this afternoon nonradiating. No history of similar. No known GI bleeding problems however patient does take NSAIDs fairly regularly. Patient was found to have a blood pressure 60 systolic with abdominal pain per EMS. Blood pressure improved to 80s with fluid bolus. On exam patient has mild to moderate central and left sided abdominal pain on exam no guarding. Mild pale conjunctiva, pale skin, lungs clear regular rate and rhythm.  Concern for emergent abdominal pathology such as AAA/ischemic bowel/bleeding ulcer/other versus inferior cardiac strain versus prerenal versus infectious versus other.  Bedside ultrasound mild difficulty due to bowel gas however no significant AAA visualized no free fluid in the abdomen. Peripheral IV access difficult and ultrasound guided by myself.  Fluid bolus improved blood pressure to greater than 937 systolic and patient feels improved.  Rechecks patient stable.  Patient had a second drop in blood pressure requiring repeat fluid bolus. Surgery consult to medicine admission. CT scan discuss with radiology with concerns for ischemic bowel and no large clot visualized.  Fluids, Zosyn, medicine and surgery consults.  CRITICAL CARE Performed by: Mariea Clonts  Total critical care time: 35 min  Critical care time was exclusive of separately billable procedures and treating other patients.  Critical care was necessary to treat or prevent imminent or life-threatening deterioration.  Critical  care was time spent personally by me on the following activities: development of treatment plan with patient and/or surrogate as well as nursing, discussions with consultants, evaluation of patient's response to treatment, examination of patient, obtaining history from patient or surrogate, ordering and performing treatments and interventions, ordering and review of laboratory studies, ordering and review of radiographic studies, pulse oximetry and re-evaluation of patient's condition.  EMERGENCY DEPARTMENT ULTRASOUND  Study: Limited Retroperitoneal Ultrasound of the Abdominal Aorta.  INDICATIONS:Hypotension, Abdominal pain and Age>55  Multiple views of the abdominal aorta were obtained in real-time from the diaphragmatic hiatus to the aortic bifurcation in transverse planes with a multi-frequency probe.  PERFORMED BY: Myself  IMAGES ARCHIVED?: Yes  FINDINGS: Maximum aortic dimensions are 2.2cm  LIMITATIONS: Bowel gas and Abdominal pain  INTERPRETATION: No abdominal aortic aneurysm  Emergency Ultrasound Study:  Angiocath insertion Performed by: Mariea Clonts  Consent: Verbal consent obtained. Risks and benefits: risks, benefits and alternatives were discussed Immediately prior to procedure the correct patient, procedure, equipment, support staff and site/side marked as needed.  Indication: difficult IV access  Preparation: Patient was prepped and draped in the usual sterile fashion.  Vein Location: right ac vein was visualized during assessment for potential access sites and was found to be patent/ easily compressed with linear ultrasound. The needle was visualized with real-time ultrasound and guided into the vein.  Gauge: 18 g  Image saved and stored.  Normal blood return. Patient tolerance: Patient tolerated the procedure well with no immediate complications. Ischemic bowel, hypotension, abdo pain      Mariea Clonts, MD 05/08/14 0040

## 2014-05-08 NOTE — H&P (Signed)
INTERNAL MEDICINE TEACHING ATTENDING NOTE  Day 1 of stay  Patient name: Jack Wallace  MRN: [id number] Date of birth: 1961/08/30   53 y.o.man admitted with abdominal pain.  Past medical history significant for hypertension, hyperlipidemia, DM2.   Per patient, he was normal yesterday morning before going to work. He had a good breakfast without any issues. However after reaching work, he started feeling fatigued. Mild abdominal pain started. It was constant but it was bearable. He worked feeling unwell till the end of his shift, when the pain increased and he felt extremely dizzy. He asked to call paramedics and when they arrived, he passed out. He was hypotensive to SBP 60 per EMT. He was resuscitated with 1 liter bolus in the ED.   Patient's CTA abdomen shows pneumatosis in the cecal wall, in mesenteric veins, portal venous gas in the left lobe of the liver, no obstruction, no wall thickening. The vascular structures reviewed in CTA are all widely patent.  Patient has no vomiting or diarrhea. He reports being able to pass flatus. BM last morning.  Exam today - patient is still in significant pain. He is lying still on his bed. He is not tachycardic, not diaphoretic. He has significant tenderness to palpation in RLQ and right middle area, and mild tenderness diffusely all over the abdomen.  He is BS sluggish, no guarding or rigidity. Mild to trace pedal edema.    Recent Labs Lab 05/07/14 1601 05/08/14 0440 05/08/14 1055  HGB 12.4* 12.4* 12.1*  HCT 37.0* 36.9* 36.0*  WBC 12.0* 13.2* 13.5*  PLT 198 207 207     Recent Labs Lab 05/07/14 1601 05/08/14 0440  NA 140 140  K 4.5 3.5*  CL 104 105  CO2 21 21  GLUCOSE 123* 166*  BUN 26* 23  CREATININE 2.06* 1.18  CALCIUM 9.1 8.4      Recent Labs Lab 05/07/14 2217 05/08/14 0440 05/08/14 1055  TROPONINI <0.30 <0.30 <0.30    Results for Jack, Wallace (MRN [id number]) as of 05/08/2014 12:42  Ref. Range 05/07/2014 16:33 05/07/2014  16:35 05/07/2014 22:17 05/08/2014 04:40 05/08/2014 10:55  Lactic Acid, Venous Latest Range: 0.5-2.2 mmol/L  1.33  2.0 1.1     Assessment and Plan    Pain abdomen - Unsure of etiology - Possible differentials could be:  mesenteric ischemia due to low blood pressure and shock like state. Pneumatosis intestinalis indicates that. However, most major vessels including SMA are patent in CTA. But there are no peritoneal signs.   venous thrombosis - insidious onset over a period of a day might indicate that. However no signs of necrosis on CT.   intestinal infection - we have started empiric coverage. Patient does have a white count, with spike in neutrophils, hypotension, however no focus of infection could be delineated on CTA.  Conservative management with antibiotics, pain medications and hydration for now. We will monitor for electrolytes, acidosis and lactic acid. If the pain worsens, the patient gets acidotic, lactic acid worsens, amylase comes back high, we will reconvene with surgery about the possibility of exploratory lap.    I have seen and evaluated this patient and discussed it with my IM resident team.  Please see the rest of the plan per resident note from today.   Dayani Winbush 05/08/2014, 12:38 PM.

## 2014-05-08 NOTE — Progress Notes (Signed)
Echocardiogram 2D Echocardiogram has been performed.  Ines Bloomer 05/08/2014, 10:01 AM

## 2014-05-08 NOTE — Progress Notes (Addendum)
Subjective:   Pt has no new complaints this AM.  Pt is feeling better and did not have any events overnight. VSS.  He has no PCP and reports he obtains his BP meds from a NP at his employer and states that he feels like "they never got my blood pressure medicine adjusted because it sometimes runs low."    Objective:   Vital signs in last 24 hours: Filed Vitals:   05/08/14 0309 05/08/14 0730 05/08/14 0745 05/08/14 1100  BP: 108/61  104/69   Pulse: 110  102   Temp: 99.4 F (37.4 C) 99.6 F (37.6 C)  99.5 F (37.5 C)  TempSrc: Oral Oral  Oral  Resp: 14  20   Height:      Weight:      SpO2: 97%  92%     Weight: Filed Weights   05/07/14 1700 05/08/14 0020  Weight: 278 lb 10.6 oz (126.4 kg) 275 lb 12.7 oz (125.1 kg)    Ins/Outs:  Intake/Output Summary (Last 24 hours) at 05/08/14 1340 Last data filed at 05/08/14 0900  Gross per 24 hour  Intake   4145 ml  Output   1200 ml  Net   2945 ml    Physical Exam: Constitutional: Vital signs reviewed.  Patient is sitting up in bed in no acute distress and cooperative with exam.   HEENT: [place]/AT; PERRL, EOMI, conjunctivae normal, no scleral icterus  Cardiovascular: RRR, no MRG Pulmonary/Chest: normal respiratory effort, no accessory muscle use, CTAB, no wheezes, rales, or rhonchi Abdominal: Obese. Soft. +bs, RUQ TTP without guarding or rebound Neurological: A&O x3, CN II_XII grossly intact; non-focal exam Extremities: 2+DP b/l, no C/C/E  Skin: Warm, dry and intact. No rash  Lab Results:  BMP:  Recent Labs Lab 05/07/14 1601 05/08/14 0440  NA 140 140  K 4.5 3.5*  CL 104 105  CO2 21 21  GLUCOSE 123* 166*  BUN 26* 23  CREATININE 2.06* 1.18  CALCIUM 9.1 8.4   Anion Gap:  14  CBC:  Recent Labs Lab 05/07/14 1601 05/08/14 0440 05/08/14 1055  WBC 12.0* 13.2* 13.5*  NEUTROABS 9.3*  --   --   HGB 12.4* 12.4* 12.1*  HCT 37.0* 36.9* 36.0*  MCV 88.7 87.6 87.8  PLT 198 207 207    Coagulation:  Recent Labs Lab  05/07/14 1601  LABPROT 13.0  INR 1.00    CBG:            Recent Labs Lab 05/07/14 2039 05/07/14 2339 05/08/14 0308 05/08/14 0746  GLUCAP 125* 126* 151* 121*           HA1C:      No results found for this basename: HGBA1C,  in the last 168 hours  Lipid Panel: No results found for this basename: CHOL, HDL, LDLCALC, TRIG, CHOLHDL, LDLDIRECT,  in the last 168 hours  LFTs:  Recent Labs Lab 05/07/14 1601  AST 15  ALT 14  ALKPHOS 39  BILITOT 0.5  PROT 6.6  ALBUMIN 4.1    Pancreatic Enzymes:  Recent Labs Lab 05/07/14 1601  LIPASE 43    Lactic Acid/Procalcitonin:  Recent Labs Lab 05/07/14 1635 05/08/14 0440 05/08/14 1055  LATICACIDVEN 1.33 2.0 1.1    Ammonia: No results found for this basename: AMMONIA,  in the last 168 hours  Cardiac Enzymes:  Recent Labs Lab 05/07/14 2217 05/08/14 0440 05/08/14 1055  TROPONINI <0.30 <0.30 <0.30    EKG: EKG Interpretation  Date/Time:  Monday May 07 2014 15:59:33 EDT Ventricular Rate:  82 PR Interval:  177 QRS Duration: 101 QT Interval:  367 QTC Calculation: 429 R Axis:   61 Text Interpretation:  Sinus rhythm RSR' in V1 or V2, right VCD or RVH Abnormal inferior Q waves Borderline ST elevation, inferior leads Confirmed by ZAVITZ  MD, JOSHUA ([id number]) on 05/07/2014 4:12:03 PM   BNP:  Recent Labs Lab 05/07/14 1612  PROBNP 37.8    D-Dimer: No results found for this basename: DDIMER,  in the last 168 hours  Urinalysis:  Recent Labs Lab 05/07/14 2217  COLORURINE YELLOW  LABSPEC 1.037*  PHURINE 5.0  GLUCOSEU NEGATIVE  HGBUR NEGATIVE  BILIRUBINUR NEGATIVE  KETONESUR NEGATIVE  PROTEINUR NEGATIVE  UROBILINOGEN 0.2  NITRITE NEGATIVE  LEUKOCYTESUR NEGATIVE    Micro Results: Recent Results (from the past 240 hour(s))  MRSA PCR SCREENING     Status: None   Collection Time    05/08/14 12:10 AM      Result Value Ref Range Status   MRSA by PCR NEGATIVE  NEGATIVE Final   Comment:            The  GeneXpert MRSA Assay (FDA     approved for NASAL specimens     only), is one component of a     comprehensive MRSA colonization     surveillance program. It is not     intended to diagnose MRSA     infection nor to guide or     monitor treatment for     MRSA infections.    Blood Culture: No results found for this basename: sdes,  specrequest,  cult,  reptstatus    Studies/Results: Dg Chest Port 1 View  05/07/2014   CLINICAL DATA:  Dizziness, abdominal pain  EXAM: PORTABLE CHEST - 1 VIEW  COMPARISON:  None.  FINDINGS: The heart size and mediastinal contours are within normal limits. Both lungs are clear. The visualized skeletal structures are unremarkable.  IMPRESSION: No active disease.   Electronically Signed   By: Kathreen Devoid   On: 05/07/2014 16:40   Ct Angio Abd/pel W/ And/or W/o  05/07/2014   CLINICAL DATA:  Severe mid abdominal pain, hypotension, concern for abdominal dissection.  EXAM: CT ANGIOGRAPHY ABDOMEN AND PELVIS WITH CONTRAST AND WITHOUT CONTRAST  TECHNIQUE: Multidetector CT imaging of the abdomen and pelvis was performed using the standard protocol during bolus administration of intravenous contrast. Multiplanar reconstructed images including MIPs were obtained and reviewed to evaluate the vascular anatomy.  CONTRAST:  146mL OMNIPAQUE IOHEXOL 350 MG/ML SOLN  COMPARISON:  None.  FINDINGS: Vascular Findings:  Abdominal aorta: Normal caliber of the abdominal aorta. There is no significant atherosclerotic plaque within the abdominal aorta. No abdominal aortic dissection or periaortic stranding.  Celiac artery: Widely patent without hemodynamically significant narrowing.  SMA: Widely patent without hemodynamically significant narrowing. Incidental note is made of a replaced right hepatic artery which arises from the proximal SMA. Note, evaluation of the mid/ distal aspect of the main trunk of the SMA is minimally degraded secondary to respiratory artifact. The distal tributaries of the  SMA (particularly of the of supplying the right lower abdominal quadrant) are widely patent. There are no discrete filling defects to suggest distal embolism.  Right Renal artery: Solitary; widely patent; the right inferior phrenic artery is noted to arise from the proximal aspect of the right renal artery.  Left Renal artery: Solitary; widely patent without hemodynamically significant narrowing.  IMA: Widely patent  Pelvic vasculature: The bilateral common,  external and internal iliac arteries are of normal caliber and widely patent without hemodynamically significant narrowing.  Review of the MIP images confirms the above findings.   --------------------------------------------------------------------------------  Nonvascular Findings:  Pneumatosis is seen within the wall of the cecum (representative axial images 124, 130, 136 and 143; sagittal image 24, series 7). There is air seen within multiple draining mesenteric veins (representative images 80, 81 104, 121 and 122, series 4). These findings are associated with a minimal amount of portal venous gas seen primarily within the nondependent portions of the left lobe of the liver (representative images 47 and 52). The etiology of this pneumatosis is not depicted on this examination. Specifically, there are no discrete filling defects within the SMA tributaries with supplying the cecum. No evidence of volvulus. No pneumoperitoneum. No definable/drainable intra-abdominal abscess. Normal appearance of the appendix.  The bowel is otherwise normal in course and caliber without wall thickening or evidence of obstruction. Normal appearance of the terminal ileum. There is a moderate amount of liquid stool throughout the colon. No evidence of enteric obstruction.  Shotty porta hepatis lymph nodes are individually not enlarged by size criteria with index gastrohepatic lymph node measuring 1.1 cm in greatest short axis diameter (image 67, series 4). No retroperitoneal,  mesenteric, pelvic or inguinal lymphadenopathy.  Normal hepatic contour. No discrete hepatic lesions. Normal appearance of the gallbladder given degree distention. No intra extrahepatic biliary dilatation. No ascites.  There is symmetric enhancement of the bilateral kidneys. No discrete renal lesions. No definite renal stones on this postcontrast examination. There is a very minimal amount of grossly symmetric perinephric stranding. No urinary obstruction. Normal appearance of the bilateral adrenal glands, pancreas and spleen.  Scattered calcifications within a normal size prostate gland. Normal appearance of the urinary bladder given degree distention. No free fluid within the pelvis.  No acute or aggressive osseus abnormalities. Stigmata of DISH within the lower thoracic and upper lumbar spine.  Small mesenteric fat containing periumbilical hernia. There is a peripherally calcified subcutaneous nodule within the pannus of the left lower abdomen, likely a location of fat necrosis. Small mesenteric fat containing inguinal hernias.  IMPRESSION: 1. Pneumatosis involving the cecum with associated air within the draining mesenteric veins and small amount of portal venous gas. The etiology of this pneumatosis is not depicted as examination. Specifically, there is no evidence of abdominal aortic aneurysm or distal embolism affecting the distal tributaries of the SMA supplying the cecum and there is no evidence of cecal volvulus. Further evaluation could be performed with cardiac echo to evaluate for a potential cardiogenic source of a potential resolved distal embolism as clinically indicated. 2. No evidence of abdominal aortic aneurysm or significant atherosclerotic plaque affecting the abdominal aorta.  Critical Value/emergent results were called by telephone at the time of interpretation on 05/07/2014 at 5:02 PM to Dr. Elnora Morrison , who verbally acknowledged these results.   Electronically Signed   By: Sandi Mariscal  M.D.   On: 05/07/2014 17:21    Medications:  Scheduled Meds: . ciprofloxacin  400 mg Intravenous Q12H  . gabapentin  600 mg Oral TID  . insulin aspart  0-15 Units Subcutaneous 6 times per day  . metronidazole  500 mg Intravenous Q8H   Continuous Infusions: . sodium chloride 125 mL/hr at 05/08/14 0900   PRN Meds: HYDROmorphone (DILAUDID) injection, ondansetron (ZOFRAN) IV, ondansetron  Antibiotics: Antibiotics Given (last 72 hours)   Date/Time Action Medication Dose Rate   05/08/14 1114 Given   ciprofloxacin (CIPRO)  IVPB 400 mg 400 mg 200 mL/hr      Day of Hospitalization: 1  Consults:    Assessment/Plan:   Principal Problem:   Pneumatosis of intestines Active Problems:   Type II or unspecified type diabetes mellitus without mention of complication, uncontrolled   Hyperlipidemia   Diabetic neuropathy   Syncope   Hypotension  Syncope Etiology unclear.  Pt works at [employer] and reports that it was very hot yesterday and did not feel well.  Orthostatics are positive.  He reports BP sometimes runs low because he feels that he is taking too much BP medication which is prescribed by a NP at his employer.  He does not have a PCP.  He looks well this AM and has no other complaints.  He is tender to palpation but mainly in the RUQ without rebound or guarding.  Trops x 3 neg.  Echo revealed LVEF of 55-60% with grade 1 diastolic dysfunction.   -continue IVF   Abdominal pain w/ pneumatosis on CT Pt w/ epigastric pain earlier today, followed by episode of syncope. Surgery consulted and does not recommend surgical intervention at this time given VSS without peritonitis.  He is slightly tachycardic, but reports some underlying anxiety that may also be contributing.  Lactic acid wnl therefore ischemic bowel unlikely and pt appears comfortable on exam.  Pneumatosis has unclear etiology but may not be significant or even acute.  Possibly unrelated to his presentation.  Surgery following.     -continue antibiotics zosyn and flagyl IV for now  -BCx pending  -I/O's  -NPO (ice chips)  -C. diff pending -surgery consulted, appreciate recs -amylase   AKI Pt w/ Cr of 2.06 on admission, baseline unknown.  Cr trended down today at 1.18 after IVF.   -hold home prinzide   HTN Pt on lisinopril-HCTZ 20-25 at home. Patient hypotensive on admission.  I am not sure if he even needs BP meds at this point. -hold home meds for now  -IVF  HLD On Pravachol 40 mg qhs at home.  -hold Pravastatin for now in the setting of AKI   DMII w/ neuropathy Takes metformin 1000 mg bid, liraglutide, glipizide 2.5 mg qd at home.  Sees Dr. Dwyane Dee (endocrinologist).  -Hold home meds  -SSI-M -cbgs ac/hs -continue neurontin   VTE Ppx SCD    Disposition Disposition is deferred, awaiting improvement of current medical problems.  Anticipated discharge in approximately 1-2 day(s).      LOS: 1 day   Jones Bales, MD PGY-1, Internal Medicine Teaching Service [phone] (7AM-5PM Mon-Fri) 05/08/2014, 1:40 PM

## 2014-05-09 DIAGNOSIS — R109 Unspecified abdominal pain: Secondary | ICD-10-CM | POA: Diagnosis present

## 2014-05-09 LAB — GLUCOSE, CAPILLARY
GLUCOSE-CAPILLARY: 127 mg/dL — AB (ref 70–99)
GLUCOSE-CAPILLARY: 135 mg/dL — AB (ref 70–99)
Glucose-Capillary: 120 mg/dL — ABNORMAL HIGH (ref 70–99)
Glucose-Capillary: 75 mg/dL (ref 70–99)
Glucose-Capillary: 88 mg/dL (ref 70–99)

## 2014-05-09 LAB — CBC
HEMATOCRIT: 34.2 % — AB (ref 39.0–52.0)
HEMOGLOBIN: 11.4 g/dL — AB (ref 13.0–17.0)
MCH: 29.5 pg (ref 26.0–34.0)
MCHC: 33.3 g/dL (ref 30.0–36.0)
MCV: 88.6 fL (ref 78.0–100.0)
Platelets: 172 10*3/uL (ref 150–400)
RBC: 3.86 MIL/uL — ABNORMAL LOW (ref 4.22–5.81)
RDW: 13.2 % (ref 11.5–15.5)
WBC: 10.8 10*3/uL — ABNORMAL HIGH (ref 4.0–10.5)

## 2014-05-09 LAB — BASIC METABOLIC PANEL
BUN: 15 mg/dL (ref 6–23)
CHLORIDE: 103 meq/L (ref 96–112)
CO2: 22 meq/L (ref 19–32)
Calcium: 8.1 mg/dL — ABNORMAL LOW (ref 8.4–10.5)
Creatinine, Ser: 1.07 mg/dL (ref 0.50–1.35)
GFR calc Af Amer: 90 mL/min — ABNORMAL LOW (ref >90)
GFR calc non Af Amer: 77 mL/min — ABNORMAL LOW (ref >90)
Glucose, Bld: 113 mg/dL — ABNORMAL HIGH (ref 70–99)
POTASSIUM: 3.6 meq/L — AB (ref 3.7–5.3)
Sodium: 137 mEq/L (ref 137–147)

## 2014-05-09 LAB — LACTIC ACID, PLASMA: Lactic Acid, Venous: 0.7 mmol/L (ref 0.5–2.2)

## 2014-05-09 MED ORDER — POTASSIUM CHLORIDE 10 MEQ/100ML IV SOLN
10.0000 meq | INTRAVENOUS | Status: AC
  Administered 2014-05-09 (×2): 10 meq via INTRAVENOUS
  Filled 2014-05-09: qty 100

## 2014-05-09 MED ORDER — PROMETHAZINE HCL 25 MG/ML IJ SOLN
25.0000 mg | Freq: Once | INTRAMUSCULAR | Status: AC
  Administered 2014-05-09: 25 mg via INTRAVENOUS
  Filled 2014-05-09: qty 1

## 2014-05-09 NOTE — Progress Notes (Signed)
    Day 2 of stay      Patient name: Jack Wallace  Medical record number: [id number]  Date of birth: 01-27-1961   Patient feels slightly improved. Still complains of 8/10 pain when its worse, which is with movement. Does not feel hungry. No vomiting, no fever.  Filed Vitals:   05/09/14 0305 05/09/14 0756 05/09/14 1152 05/09/14 1200  BP: 105/64 108/69 403/64 103/74  Pulse: 88 86 86   Temp: 98.5 F (36.9 C) 98.5 F (36.9 C) 97.5 F (36.4 C)   TempSrc: Oral Oral Oral   Resp: 21 16 16    Height:      Weight:      SpO2: 95% 99% 94%    BP 403/64 is a typo by the RN I am sure.  Physical Exam   General: Resting in bed, lying very still. Heart: RRR, no rubs, murmurs or gallops. Lungs: Clear to auscultation bilaterally. Abdomen: Soft, nondistended, BS present, tenderness right middle and right lower quadrant. Extremities: Warm, no pedal edema. Neuro: Alert and oriented X3, gross intact.   Pertinent labs    Recent Labs Lab 05/08/14 1055 05/08/14 1714 05/09/14 0238  HGB 12.1* 12.0* 11.4*  HCT 36.0* 35.9* 34.2*  WBC 13.5* 13.7* 10.8*  PLT 207 201 172     Recent Labs Lab 05/07/14 1601 05/08/14 0440 05/09/14 0238  NA 140 140 137  K 4.5 3.5* 3.6*  CL 104 105 103  CO2 21 21 22   GLUCOSE 123* 166* 113*  BUN 26* 23 15  CREATININE 2.06* 1.18 1.07  CALCIUM 9.1 8.4 8.1*    Recent Labs Lab 05/07/14 1601  AST 15  ALT 14  ALKPHOS 39  BILITOT 0.5  PROT 6.6  ALBUMIN 4.1  INR 1.00     Assessment and Plan   We continue to monitor vitals and blood work for acidosis. At this point, management remains conservative. Etiology for abdominal pain still uncertain. We appreciate surgical input.  Still NPO, no BM passing flatus. If pain is tolerable, (?) starting clears late tomorrow could be considered after consulting with surgery.      I have discussed the care of this patient with my IM team residents. Please see the resident note for details.  Tonyetta Berko 05/09/2014, 12:47 PM.

## 2014-05-09 NOTE — Progress Notes (Signed)
Subjective:   VSS.  Pt still c/o RUQ pain that increases with changes in position.  He describes the pain as being sharp and comes and goes.  He rates it about 7/10 currently.  Otherwise, he has no complaints.    Objective:   Vital signs in last 24 hours: Filed Vitals:   05/08/14 1900 05/08/14 2328 05/09/14 0305 05/09/14 0756  BP: 116/71 114/79 105/64 108/69  Pulse: 97 94 88 86  Temp: 98.9 F (37.2 C) 99 F (37.2 C) 98.5 F (36.9 C) 98.5 F (36.9 C)  TempSrc: Oral Oral Oral Oral  Resp: 22 22 21 16   Height:      Weight:      SpO2: 98% 97% 95% 99%    Weight: Filed Weights   05/07/14 1700 05/08/14 0020  Weight: 278 lb 10.6 oz (126.4 kg) 275 lb 12.7 oz (125.1 kg)    Ins/Outs:  Intake/Output Summary (Last 24 hours) at 05/09/14 1148 Last data filed at 05/09/14 1100  Gross per 24 hour  Intake   3250 ml  Output   1350 ml  Net   1900 ml    Physical Exam: Constitutional: Vital signs reviewed.  Patient is lying in bed in no acute distress and cooperative with exam.   HEENT: [place]/AT; PERRL, EOMI, conjunctivae normal, no scleral icterus  Cardiovascular: RRR, no MRG Pulmonary/Chest: normal respiratory effort, no accessory muscle use, CTAB, no wheezes, rales, or rhonchi Abdominal: Obese. Soft. +bs, RUQ TTP without guarding or rebound Neurological: A&O x3, CN II-XII grossly intact; non-focal exam Extremities: 2+DP b/l, no C/C/E  Skin: Warm, dry and intact. No rash  Lab Results:  BMP:  Recent Labs Lab 05/08/14 0440 05/09/14 0238  NA 140 137  K 3.5* 3.6*  CL 105 103  CO2 21 22  GLUCOSE 166* 113*  BUN 23 15  CREATININE 1.18 1.07  CALCIUM 8.4 8.1*   Anion Gap:  12  CBC:  Recent Labs Lab 05/07/14 1601  05/08/14 1714 05/09/14 0238  WBC 12.0*  < > 13.7* 10.8*  NEUTROABS 9.3*  --   --   --   HGB 12.4*  < > 12.0* 11.4*  HCT 37.0*  < > 35.9* 34.2*  MCV 88.7  < > 87.1 88.6  PLT 198  < > 201 172  < > = values in this interval not  displayed.  Coagulation:  Recent Labs Lab 05/07/14 1601  LABPROT 13.0  INR 1.00    CBG:            Recent Labs Lab 05/08/14 1554 05/08/14 1800 05/08/14 1959 05/08/14 2330 05/09/14 0301 05/09/14 0756  GLUCAP 109* 100* 106* 127* 120* 127*           HA1C:       Recent Labs Lab 05/08/14 0440  HGBA1C 6.5*    Lipid Panel: No results found for this basename: CHOL, HDL, LDLCALC, TRIG, CHOLHDL, LDLDIRECT,  in the last 168 hours  LFTs:  Recent Labs Lab 05/07/14 1601  AST 15  ALT 14  ALKPHOS 39  BILITOT 0.5  PROT 6.6  ALBUMIN 4.1    Pancreatic Enzymes:  Recent Labs Lab 05/07/14 1601 05/08/14 1714  LIPASE 43  --   AMYLASE  --  34    Lactic Acid/Procalcitonin:  Recent Labs Lab 05/08/14 0440 05/08/14 1055 05/09/14 0238  LATICACIDVEN 2.0 1.1 0.7    Ammonia: No results found for this basename: AMMONIA,  in the last 168 hours  Cardiac Enzymes:  Recent Labs  Lab 05/07/14 2217 05/08/14 0440 05/08/14 1055  TROPONINI <0.30 <0.30 <0.30    EKG: EKG Interpretation  Date/Time:  Monday May 07 2014 15:59:33 EDT Ventricular Rate:  82 PR Interval:  177 QRS Duration: 101 QT Interval:  367 QTC Calculation: 429 R Axis:   61 Text Interpretation:  Sinus rhythm RSR' in V1 or V2, right VCD or RVH Abnormal inferior Q waves Borderline ST elevation, inferior leads Confirmed by ZAVITZ  MD, JOSHUA ([id number]) on 05/07/2014 4:12:03 PM   BNP:  Recent Labs Lab 05/07/14 1612  PROBNP 37.8    D-Dimer: No results found for this basename: DDIMER,  in the last 168 hours  Urinalysis:  Recent Labs Lab 05/07/14 2217  Lee 1.037*  PHURINE 5.0  GLUCOSEU NEGATIVE  HGBUR NEGATIVE  BILIRUBINUR NEGATIVE  KETONESUR NEGATIVE  PROTEINUR NEGATIVE  UROBILINOGEN 0.2  NITRITE NEGATIVE  LEUKOCYTESUR NEGATIVE    Micro Results: Recent Results (from the past 240 hour(s))  CULTURE, BLOOD (ROUTINE X 2)     Status: None   Collection Time     05/07/14  7:26 PM      Result Value Ref Range Status   Specimen Description BLOOD HAND RIGHT   Final   Special Requests BOTTLES DRAWN AEROBIC ONLY 2CC   Final   Culture  Setup Time     Final   Value: 05/08/2014 00:40     Performed at [facility]   Culture     Final   Value:        BLOOD CULTURE RECEIVED NO GROWTH TO DATE CULTURE WILL BE HELD FOR 5 DAYS BEFORE ISSUING A FINAL NEGATIVE REPORT     Performed at [facility]   Report Status PENDING   Incomplete  CULTURE, BLOOD (ROUTINE X 2)     Status: None   Collection Time    05/07/14  7:30 PM      Result Value Ref Range Status   Specimen Description BLOOD ARM LEFT   Final   Special Requests BOTTLES DRAWN AEROBIC AND ANAEROBIC 6CC   Final   Culture  Setup Time     Final   Value: 05/08/2014 00:41     Performed at [facility]   Culture     Final   Value:        BLOOD CULTURE RECEIVED NO GROWTH TO DATE CULTURE WILL BE HELD FOR 5 DAYS BEFORE ISSUING A FINAL NEGATIVE REPORT     Performed at [facility]   Report Status PENDING   Incomplete  MRSA PCR SCREENING     Status: None   Collection Time    05/08/14 12:10 AM      Result Value Ref Range Status   MRSA by PCR NEGATIVE  NEGATIVE Final   Comment:            The GeneXpert MRSA Assay (FDA     approved for NASAL specimens     only), is one component of a     comprehensive MRSA colonization     surveillance program. It is not     intended to diagnose MRSA     infection nor to guide or     monitor treatment for     MRSA infections.    Blood Culture:    Component Value Date/Time   SDES BLOOD ARM LEFT 05/07/2014 1930    Studies/Results: Dg Chest Port 1 View  05/07/2014   CLINICAL DATA:  Dizziness, abdominal pain  EXAM:  PORTABLE CHEST - 1 VIEW  COMPARISON:  None.  FINDINGS: The heart size and mediastinal contours are within normal limits. Both lungs are clear. The visualized skeletal structures are unremarkable.  IMPRESSION: No active disease.    Electronically Signed   By: Kathreen Devoid   On: 05/07/2014 16:40   Ct Angio Abd/pel W/ And/or W/o  05/07/2014   CLINICAL DATA:  Severe mid abdominal pain, hypotension, concern for abdominal dissection.  EXAM: CT ANGIOGRAPHY ABDOMEN AND PELVIS WITH CONTRAST AND WITHOUT CONTRAST  TECHNIQUE: Multidetector CT imaging of the abdomen and pelvis was performed using the standard protocol during bolus administration of intravenous contrast. Multiplanar reconstructed images including MIPs were obtained and reviewed to evaluate the vascular anatomy.  CONTRAST:  11mL OMNIPAQUE IOHEXOL 350 MG/ML SOLN  COMPARISON:  None.  FINDINGS: Vascular Findings:  Abdominal aorta: Normal caliber of the abdominal aorta. There is no significant atherosclerotic plaque within the abdominal aorta. No abdominal aortic dissection or periaortic stranding.  Celiac artery: Widely patent without hemodynamically significant narrowing.  SMA: Widely patent without hemodynamically significant narrowing. Incidental note is made of a replaced right hepatic artery which arises from the proximal SMA. Note, evaluation of the mid/ distal aspect of the main trunk of the SMA is minimally degraded secondary to respiratory artifact. The distal tributaries of the SMA (particularly of the of supplying the right lower abdominal quadrant) are widely patent. There are no discrete filling defects to suggest distal embolism.  Right Renal artery: Solitary; widely patent; the right inferior phrenic artery is noted to arise from the proximal aspect of the right renal artery.  Left Renal artery: Solitary; widely patent without hemodynamically significant narrowing.  IMA: Widely patent  Pelvic vasculature: The bilateral common, external and internal iliac arteries are of normal caliber and widely patent without hemodynamically significant narrowing.  Review of the MIP images confirms the above findings.    --------------------------------------------------------------------------------  Nonvascular Findings:  Pneumatosis is seen within the wall of the cecum (representative axial images 124, 130, 136 and 143; sagittal image 24, series 7). There is air seen within multiple draining mesenteric veins (representative images 80, 81 104, 121 and 122, series 4). These findings are associated with a minimal amount of portal venous gas seen primarily within the nondependent portions of the left lobe of the liver (representative images 47 and 52). The etiology of this pneumatosis is not depicted on this examination. Specifically, there are no discrete filling defects within the SMA tributaries with supplying the cecum. No evidence of volvulus. No pneumoperitoneum. No definable/drainable intra-abdominal abscess. Normal appearance of the appendix.  The bowel is otherwise normal in course and caliber without wall thickening or evidence of obstruction. Normal appearance of the terminal ileum. There is a moderate amount of liquid stool throughout the colon. No evidence of enteric obstruction.  Shotty porta hepatis lymph nodes are individually not enlarged by size criteria with index gastrohepatic lymph node measuring 1.1 cm in greatest short axis diameter (image 67, series 4). No retroperitoneal, mesenteric, pelvic or inguinal lymphadenopathy.  Normal hepatic contour. No discrete hepatic lesions. Normal appearance of the gallbladder given degree distention. No intra extrahepatic biliary dilatation. No ascites.  There is symmetric enhancement of the bilateral kidneys. No discrete renal lesions. No definite renal stones on this postcontrast examination. There is a very minimal amount of grossly symmetric perinephric stranding. No urinary obstruction. Normal appearance of the bilateral adrenal glands, pancreas and spleen.  Scattered calcifications within a normal size prostate gland. Normal appearance of the urinary  bladder given degree  distention. No free fluid within the pelvis.  No acute or aggressive osseus abnormalities. Stigmata of DISH within the lower thoracic and upper lumbar spine.  Small mesenteric fat containing periumbilical hernia. There is a peripherally calcified subcutaneous nodule within the pannus of the left lower abdomen, likely a location of fat necrosis. Small mesenteric fat containing inguinal hernias.  IMPRESSION: 1. Pneumatosis involving the cecum with associated air within the draining mesenteric veins and small amount of portal venous gas. The etiology of this pneumatosis is not depicted as examination. Specifically, there is no evidence of abdominal aortic aneurysm or distal embolism affecting the distal tributaries of the SMA supplying the cecum and there is no evidence of cecal volvulus. Further evaluation could be performed with cardiac echo to evaluate for a potential cardiogenic source of a potential resolved distal embolism as clinically indicated. 2. No evidence of abdominal aortic aneurysm or significant atherosclerotic plaque affecting the abdominal aorta.  Critical Value/emergent results were called by telephone at the time of interpretation on 05/07/2014 at 5:02 PM to Dr. Elnora Morrison , who verbally acknowledged these results.   Electronically Signed   By: Sandi Mariscal M.D.   On: 05/07/2014 17:21    Medications:  Scheduled Meds: . ciprofloxacin  400 mg Intravenous Q12H  . gabapentin  600 mg Oral TID  . insulin aspart  0-15 Units Subcutaneous 6 times per day  . metronidazole  500 mg Intravenous Q8H   Continuous Infusions: . sodium chloride 125 mL/hr at 05/08/14 2233   PRN Meds: HYDROmorphone (DILAUDID) injection, ondansetron (ZOFRAN) IV, ondansetron  Antibiotics: Antibiotics Given (last 72 hours)   Date/Time Action Medication Dose Rate   05/08/14 1114 Given   ciprofloxacin (CIPRO) IVPB 400 mg 400 mg 200 mL/hr   05/08/14 2252 Given   ciprofloxacin (CIPRO) IVPB 400 mg 400 mg 200 mL/hr    05/09/14 1027 Given   ciprofloxacin (CIPRO) IVPB 400 mg 400 mg 200 mL/hr      Day of Hospitalization: 2  Consults:  Surgery  Assessment/Plan:   Principal Problem:   Pneumatosis of intestines Active Problems:   Type II or unspecified type diabetes mellitus without mention of complication, uncontrolled   Hyperlipidemia   Diabetic neuropathy   Syncope   Hypotension  Syncope Etiology unclear.  VSS.  Most likely orthostatic hypotension.  Pt works at [employer] and reports that it was very hot and did not feel well.  He reports BP sometimes runs low because he feels that he is taking too much BP medication which is prescribed by a NP at his employer.  He needs a PCP. Trops x 3 neg.  Echo revealed LVEF of 55-60% with grade 1 diastolic dysfunction.  -continue IVF   Abdominal pain w/ pneumatosis on CT Pt came in w/ epigastric pain followed by episode of syncope. Surgery consulted and does not recommend surgical intervention at this time given VSS without peritonitis.  Lactic acid wnl therefore ischemic bowel unlikely and pt appears comfortable on exam.  Pneumatosis has unclear etiology but may not be significant or even acute.  Possibly unrelated to his presentation.  Surgery following.  Amylase wnl.  -added cipro to flagyl IV (had 1 dose of zosyn) -BCx pending  -I/O's  -NPO (ice chips)  -C. diff canceled-pt not having diarrhea -surgery consulted, appreciate recs  AKI Resolved.  Pt w/ Cr of 2.06 on admission, baseline unknown.  Cr trended down after IVF.   -hold home prinzide   h/o HTN Pt  on lisinopril-HCTZ 20-25 at home. Patient hypotensive on admission.  I am not sure if he even needs BP meds at this point. -hold home meds for now  -IVF  Dyslipidemia On Pravachol 40 mg qhs at home.  -hold Pravastatin for now  DMII w/ neuropathy Lab Results  Component Value Date   HGBA1C 6.5* 05/08/2014  Takes metformin 1000 mg bid, liraglutide, glipizide 2.5 mg qd at home.  Sees Dr. Dwyane Dee  (endocrinologist).  -Hold home meds  -SSI-M -cbgs  -continue neurontin   VTE Ppx SCD  Electrolytes Hypokalemia -Potassium repleted  Disposition Disposition is deferred, awaiting improvement of current medical problems.  Anticipated discharge in approximately 1-2 day(s).  Stable to transfer out of SDU.     LOS: 2 days   Jones Bales, MD PGY-1, Internal Medicine Teaching Service [phone] (7AM-5PM Mon-Fri) 05/09/2014, 11:48 AM

## 2014-05-09 NOTE — Progress Notes (Signed)
I have seen and examined the pt and agree with PA-Dort's progress note. Improving soreness.  Benign abd exam. Will follow

## 2014-05-09 NOTE — Progress Notes (Addendum)
Subjective: Pt feels about the same today.  Mild nausea and persistent RLQ abdominal pain.  Hasn't ambulated much.  No vomiting.  Some flatus but no BM.    Objective: Vital signs in last 24 hours: Temp:  [98.5 F (36.9 C)-99.6 F (37.6 C)] 98.5 F (36.9 C) (05/20 0305) Pulse Rate:  [88-104] 88 (05/20 0305) Resp:  [20-24] 21 (05/20 0305) BP: (104-116)/(61-79) 105/64 mmHg (05/20 0305) SpO2:  [92 %-98 %] 95 % (05/20 0305) Last BM Date: 05/07/14  Intake/Output from previous day: 05/19 0701 - 05/20 0700 In: 3000 [I.V.:3000] Out: 1750 [Urine:1750] Intake/Output this shift:    PE: Gen:  Alert, NAD, pleasant Card:  RRR, no M/G/R heard Pulm:  CTA, no W/R/R Abd: Soft, miild (improved) tenderness in RLQ, mild distension, +BS, no HSM   Lab Results:   Recent Labs  05/08/14 1714 05/09/14 0238  WBC 13.7* 10.8*  HGB 12.0* 11.4*  HCT 35.9* 34.2*  PLT 201 172   BMET  Recent Labs  05/08/14 0440 05/09/14 0238  NA 140 137  K 3.5* 3.6*  CL 105 103  CO2 21 22  GLUCOSE 166* 113*  BUN 23 15  CREATININE 1.18 1.07  CALCIUM 8.4 8.1*   PT/INR  Recent Labs  05/07/14 1601  LABPROT 13.0  INR 1.00   CMP     Component Value Date/Time   NA 137 05/09/2014 0238   K 3.6* 05/09/2014 0238   CL 103 05/09/2014 0238   CO2 22 05/09/2014 0238   GLUCOSE 113* 05/09/2014 0238   BUN 15 05/09/2014 0238   CREATININE 1.07 05/09/2014 0238   CALCIUM 8.1* 05/09/2014 0238   PROT 6.6 05/07/2014 1601   ALBUMIN 4.1 05/07/2014 1601   AST 15 05/07/2014 1601   ALT 14 05/07/2014 1601   ALKPHOS 39 05/07/2014 1601   BILITOT 0.5 05/07/2014 1601   GFRNONAA 77* 05/09/2014 0238   GFRAA 90* 05/09/2014 0238   Lipase     Component Value Date/Time   LIPASE 43 05/07/2014 1601       Studies/Results: Dg Chest Port 1 View  05/07/2014   CLINICAL DATA:  Dizziness, abdominal pain  EXAM: PORTABLE CHEST - 1 VIEW  COMPARISON:  None.  FINDINGS: The heart size and mediastinal contours are within normal limits. Both  lungs are clear. The visualized skeletal structures are unremarkable.  IMPRESSION: No active disease.   Electronically Signed   By: Kathreen Devoid   On: 05/07/2014 16:40   Ct Angio Abd/pel W/ And/or W/o  05/07/2014   CLINICAL DATA:  Severe mid abdominal pain, hypotension, concern for abdominal dissection.  EXAM: CT ANGIOGRAPHY ABDOMEN AND PELVIS WITH CONTRAST AND WITHOUT CONTRAST  TECHNIQUE: Multidetector CT imaging of the abdomen and pelvis was performed using the standard protocol during bolus administration of intravenous contrast. Multiplanar reconstructed images including MIPs were obtained and reviewed to evaluate the vascular anatomy.  CONTRAST:  12mL OMNIPAQUE IOHEXOL 350 MG/ML SOLN  COMPARISON:  None.  FINDINGS: Vascular Findings:  Abdominal aorta: Normal caliber of the abdominal aorta. There is no significant atherosclerotic plaque within the abdominal aorta. No abdominal aortic dissection or periaortic stranding.  Celiac artery: Widely patent without hemodynamically significant narrowing.  SMA: Widely patent without hemodynamically significant narrowing. Incidental note is made of a replaced right hepatic artery which arises from the proximal SMA. Note, evaluation of the mid/ distal aspect of the main trunk of the SMA is minimally degraded secondary to respiratory artifact. The distal tributaries of the SMA (particularly of the of  supplying the right lower abdominal quadrant) are widely patent. There are no discrete filling defects to suggest distal embolism.  Right Renal artery: Solitary; widely patent; the right inferior phrenic artery is noted to arise from the proximal aspect of the right renal artery.  Left Renal artery: Solitary; widely patent without hemodynamically significant narrowing.  IMA: Widely patent  Pelvic vasculature: The bilateral common, external and internal iliac arteries are of normal caliber and widely patent without hemodynamically significant narrowing.  Review of the MIP  images confirms the above findings.   --------------------------------------------------------------------------------  Nonvascular Findings:  Pneumatosis is seen within the wall of the cecum (representative axial images 124, 130, 136 and 143; sagittal image 24, series 7). There is air seen within multiple draining mesenteric veins (representative images 80, 81 104, 121 and 122, series 4). These findings are associated with a minimal amount of portal venous gas seen primarily within the nondependent portions of the left lobe of the liver (representative images 47 and 52). The etiology of this pneumatosis is not depicted on this examination. Specifically, there are no discrete filling defects within the SMA tributaries with supplying the cecum. No evidence of volvulus. No pneumoperitoneum. No definable/drainable intra-abdominal abscess. Normal appearance of the appendix.  The bowel is otherwise normal in course and caliber without wall thickening or evidence of obstruction. Normal appearance of the terminal ileum. There is a moderate amount of liquid stool throughout the colon. No evidence of enteric obstruction.  Shotty porta hepatis lymph nodes are individually not enlarged by size criteria with index gastrohepatic lymph node measuring 1.1 cm in greatest short axis diameter (image 67, series 4). No retroperitoneal, mesenteric, pelvic or inguinal lymphadenopathy.  Normal hepatic contour. No discrete hepatic lesions. Normal appearance of the gallbladder given degree distention. No intra extrahepatic biliary dilatation. No ascites.  There is symmetric enhancement of the bilateral kidneys. No discrete renal lesions. No definite renal stones on this postcontrast examination. There is a very minimal amount of grossly symmetric perinephric stranding. No urinary obstruction. Normal appearance of the bilateral adrenal glands, pancreas and spleen.  Scattered calcifications within a normal size prostate gland. Normal  appearance of the urinary bladder given degree distention. No free fluid within the pelvis.  No acute or aggressive osseus abnormalities. Stigmata of DISH within the lower thoracic and upper lumbar spine.  Small mesenteric fat containing periumbilical hernia. There is a peripherally calcified subcutaneous nodule within the pannus of the left lower abdomen, likely a location of fat necrosis. Small mesenteric fat containing inguinal hernias.  IMPRESSION: 1. Pneumatosis involving the cecum with associated air within the draining mesenteric veins and small amount of portal venous gas. The etiology of this pneumatosis is not depicted as examination. Specifically, there is no evidence of abdominal aortic aneurysm or distal embolism affecting the distal tributaries of the SMA supplying the cecum and there is no evidence of cecal volvulus. Further evaluation could be performed with cardiac echo to evaluate for a potential cardiogenic source of a potential resolved distal embolism as clinically indicated. 2. No evidence of abdominal aortic aneurysm or significant atherosclerotic plaque affecting the abdominal aorta.  Critical Value/emergent results were called by telephone at the time of interpretation on 05/07/2014 at 5:02 PM to Dr. Elnora Morrison , who verbally acknowledged these results.   Electronically Signed   By: Sandi Mariscal M.D.   On: 05/07/2014 17:21    Anti-infectives: Anti-infectives   Start     Dose/Rate Route Frequency Ordered Stop   05/08/14 1030  ciprofloxacin (  CIPRO) IVPB 400 mg     400 mg 200 mL/hr over 60 Minutes Intravenous Every 12 hours 05/08/14 0924     05/07/14 2130  metroNIDAZOLE (FLAGYL) IVPB 500 mg     500 mg 100 mL/hr over 60 Minutes Intravenous Every 8 hours 05/07/14 2129     05/07/14 1730  piperacillin-tazobactam (ZOSYN) IVPB 3.375 g     3.375 g 100 mL/hr over 30 Minutes Intravenous  Once 05/07/14 1721 05/07/14 1952       Assessment/Plan Syncopal episode  Hypotension  Acute  kidney injury  Cecal pneumatosis  PV gas  Right sided abdominal pain  Morbid obesity  H/o HTN  DM 2  HLD   Plan:  -IV fluids, bowel rest, pain control, antiemetics  -Hold anti-HTN medication, avoid hypotension  -Empiric IV abx for gut flora (had one dose of zosyn in ED, start Cipro added to flagyl Day #2)  -Strict I&O  -Repeat bmet, cbc, lactate in am  -Can have ice chips (<8oz per shift) o/w NPO  -Would NOT give systemic heparinization - no evidence of blood clot  -Syncopal workup  -Not sure of etiology of cecal pneumatosis and portal venous gas. More than likely due to transient hypotension causing lack of adequate perfusion to intestine. Right now see no need for surgical intervention given no fever, tachycardia, peritonitis, bowel wall thickening.  -Will follow     LOS: 2 days    Coralie Keens 05/09/2014, 7:26 AM Pager: [phone]

## 2014-05-09 NOTE — Progress Notes (Signed)
Patient being transferred to [address] per MD order. Report called to [place], Therapist, sports. Patient will transfer in wheel chair, all VS WNL.

## 2014-05-10 ENCOUNTER — Inpatient Hospital Stay (HOSPITAL_COMMUNITY): Payer: BC Managed Care – PPO

## 2014-05-10 DIAGNOSIS — E46 Unspecified protein-calorie malnutrition: Secondary | ICD-10-CM | POA: Diagnosis present

## 2014-05-10 LAB — CBC WITH DIFFERENTIAL/PLATELET
BASOS PCT: 0 % (ref 0–1)
Basophils Absolute: 0 10*3/uL (ref 0.0–0.1)
EOS ABS: 0.4 10*3/uL (ref 0.0–0.7)
EOS PCT: 4 % (ref 0–5)
HCT: 35.3 % — ABNORMAL LOW (ref 39.0–52.0)
Hemoglobin: 11.4 g/dL — ABNORMAL LOW (ref 13.0–17.0)
LYMPHS ABS: 1.9 10*3/uL (ref 0.7–4.0)
Lymphocytes Relative: 20 % (ref 12–46)
MCH: 29.1 pg (ref 26.0–34.0)
MCHC: 32.3 g/dL (ref 30.0–36.0)
MCV: 90.1 fL (ref 78.0–100.0)
Monocytes Absolute: 0.8 10*3/uL (ref 0.1–1.0)
Monocytes Relative: 8 % (ref 3–12)
NEUTROS PCT: 68 % (ref 43–77)
Neutro Abs: 6.4 10*3/uL (ref 1.7–7.7)
PLATELETS: 188 10*3/uL (ref 150–400)
RBC: 3.92 MIL/uL — AB (ref 4.22–5.81)
RDW: 13 % (ref 11.5–15.5)
WBC: 9.4 10*3/uL (ref 4.0–10.5)

## 2014-05-10 LAB — BASIC METABOLIC PANEL
BUN: 13 mg/dL (ref 6–23)
CALCIUM: 8.4 mg/dL (ref 8.4–10.5)
CO2: 23 mEq/L (ref 19–32)
CREATININE: 0.99 mg/dL (ref 0.50–1.35)
Chloride: 102 mEq/L (ref 96–112)
GFR calc Af Amer: >90 mL/min (ref >90)
GLUCOSE: 91 mg/dL (ref 70–99)
Potassium: 4 mEq/L (ref 3.7–5.3)
SODIUM: 140 meq/L (ref 137–147)

## 2014-05-10 LAB — HEPATIC FUNCTION PANEL
ALBUMIN: 2.8 g/dL — AB (ref 3.5–5.2)
ALK PHOS: 53 U/L (ref 39–117)
ALT: 12 U/L (ref 0–53)
AST: 21 U/L (ref 0–37)
Bilirubin, Direct: <0.2 mg/dL (ref 0.0–0.3)
TOTAL PROTEIN: 6.2 g/dL (ref 6.0–8.3)
Total Bilirubin: 0.9 mg/dL (ref 0.3–1.2)

## 2014-05-10 LAB — GLUCOSE, CAPILLARY
Glucose-Capillary: 78 mg/dL (ref 70–99)
Glucose-Capillary: 86 mg/dL (ref 70–99)
Glucose-Capillary: 99 mg/dL (ref 70–99)
Glucose-Capillary: 96 mg/dL (ref 70–99)

## 2014-05-10 LAB — LACTIC ACID, PLASMA: Lactic Acid, Venous: 1.1 mmol/L (ref 0.5–2.2)

## 2014-05-10 MED ORDER — DEXTROSE 5 % IV SOLN
INTRAVENOUS | Status: DC
  Administered 2014-05-10 – 2014-05-12 (×4): via INTRAVENOUS

## 2014-05-10 MED ORDER — PROMETHAZINE HCL 25 MG/ML IJ SOLN
12.5000 mg | Freq: Four times a day (QID) | INTRAMUSCULAR | Status: DC | PRN
  Administered 2014-05-10: 12.5 mg via INTRAVENOUS
  Filled 2014-05-10: qty 1

## 2014-05-10 MED ORDER — INSULIN ASPART 100 UNIT/ML ~~LOC~~ SOLN: 0.0000 [IU] | Freq: Three times a day (TID) | SUBCUTANEOUS | Status: DC

## 2014-05-10 NOTE — Progress Notes (Signed)
Patient ID: Jack Wallace, male   DOB: 10-11-1961, 53 y.o.   MRN: [id number]   Subjective: Reports worsening pain, now to RUQ.  Associated with nausea.  No fever, chills.  Lactic acid .7.  Normal white count.   Passing flatus.  No BM.  No vomiting.   Objective:  Vital signs:  Filed Vitals:   05/09/14 1353 05/09/14 1612 05/09/14 2011 05/10/14 0411  BP: 110/74  111/66 125/75  Pulse: 83  79 77  Temp:   99.4 F (37.4 C) 98.2 F (36.8 C)  TempSrc:   Oral Oral  Resp: _0 Height:      Weight:      SpO2: 96%  95% 98%    Last BM Date: 05/07/14  Intake/Output   Yesterday:  05/20 0701 - 05/21 0700 In: 750 [I.V.:750] Out: 1950 [Urine:1950] This shift:    I/O last 3 completed shifts: In: 2250 [I.V.:2250] Out: 2450 [Urine:2450]    Physical Exam: General: Pt awake/alert/oriented x4 in no acute distress Abdomen: Soft.  Nondistended. TTP to RUQ without guarding or murphy's sign.   No evidence of peritonitis.  No incarcerated hernias.   Problem List:   Principal Problem:   Pneumatosis of intestines Active Problems:   Type II or unspecified type diabetes mellitus without mention of complication, uncontrolled   Hyperlipidemia   Diabetic neuropathy   Syncope   Hypotension   Abdominal pain, unspecified site    Results:   Labs: Results for orders placed during the hospital encounter of 05/07/14 (from the past 48 hour(s))  CBC     Status: Abnormal   Collection Time    05/08/14 10:55 AM      Result Value Ref Range   WBC 13.5 (*) 4.0 - 10.5 K/uL   RBC 4.10 (*) 4.22 - 5.81 MIL/uL   Hemoglobin 12.1 (*) 13.0 - 17.0 g/dL   HCT 36.0 (*) 39.0 - 52.0 %   MCV 87.8  78.0 - 100.0 fL   MCH 29.5  26.0 - 34.0 pg   MCHC 33.6  30.0 - 36.0 g/dL   RDW 13.0  11.5 - 15.5 %   Platelets 207  150 - 400 K/uL  TROPONIN I     Status: None   Collection Time    05/08/14 10:55 AM      Result Value Ref Range   Troponin I <0.30  <0.30 ng/mL   Comment:            Due to the release  kinetics of cTnI,     a negative result within the first hours     of the onset of symptoms does not rule out     myocardial infarction with certainty.     If myocardial infarction is still suspected,     repeat the test at appropriate intervals.  LACTIC ACID, PLASMA     Status: None   Collection Time    05/08/14 10:55 AM      Result Value Ref Range   Lactic Acid, Venous 1.1  0.5 - 2.2 mmol/L  GLUCOSE, CAPILLARY     Status: Abnormal   Collection Time    05/08/14 12:12 PM      Result Value Ref Range   Glucose-Capillary 167 (*) 70 - 99 mg/dL   Comment 1 Notify RN     Comment 2 Documented in Chart    GLUCOSE, CAPILLARY     Status: Abnormal   Collection Time    05/08/14  3:54  PM      Result Value Ref Range   Glucose-Capillary 109 (*) 70 - 99 mg/dL   Comment 1 Documented in Chart     Comment 2 Notify RN    CBC     Status: Abnormal   Collection Time    05/08/14  5:14 PM      Result Value Ref Range   WBC 13.7 (*) 4.0 - 10.5 K/uL   RBC 4.12 (*) 4.22 - 5.81 MIL/uL   Hemoglobin 12.0 (*) 13.0 - 17.0 g/dL   HCT 35.9 (*) 39.0 - 52.0 %   MCV 87.1  78.0 - 100.0 fL   MCH 29.1  26.0 - 34.0 pg   MCHC 33.4  30.0 - 36.0 g/dL   RDW 13.0  11.5 - 15.5 %   Platelets 201  150 - 400 K/uL  AMYLASE     Status: None   Collection Time    05/08/14  5:14 PM      Result Value Ref Range   Amylase 34  0 - 105 U/L  GLUCOSE, CAPILLARY     Status: Abnormal   Collection Time    05/08/14  6:00 PM      Result Value Ref Range   Glucose-Capillary 100 (*) 70 - 99 mg/dL   Comment 1 Documented in Chart     Comment 2 Notify RN    GLUCOSE, CAPILLARY     Status: Abnormal   Collection Time    05/08/14  7:59 PM      Result Value Ref Range   Glucose-Capillary 106 (*) 70 - 99 mg/dL  GLUCOSE, CAPILLARY     Status: Abnormal   Collection Time    05/08/14 11:30 PM      Result Value Ref Range   Glucose-Capillary 127 (*) 70 - 99 mg/dL  BASIC METABOLIC PANEL     Status: Abnormal   Collection Time    05/09/14  2:38  AM      Result Value Ref Range   Sodium 137  137 - 147 mEq/L   Potassium 3.6 (*) 3.7 - 5.3 mEq/L   Chloride 103  96 - 112 mEq/L   CO2 22  19 - 32 mEq/L   Glucose, Bld 113 (*) 70 - 99 mg/dL   BUN 15  6 - 23 mg/dL   Creatinine, Ser 1.07  0.50 - 1.35 mg/dL   Calcium 8.1 (*) 8.4 - 10.5 mg/dL   GFR calc non Af Amer 77 (*) >90 mL/min   GFR calc Af Amer 90 (*) >90 mL/min   Comment: (NOTE)     The eGFR has been calculated using the CKD EPI equation.     This calculation has not been validated in all clinical situations.     eGFR's persistently <90 mL/min signify possible Chronic Kidney     Disease.  CBC     Status: Abnormal   Collection Time    05/09/14  2:38 AM      Result Value Ref Range   WBC 10.8 (*) 4.0 - 10.5 K/uL   RBC 3.86 (*) 4.22 - 5.81 MIL/uL   Hemoglobin 11.4 (*) 13.0 - 17.0 g/dL   HCT 34.2 (*) 39.0 - 52.0 %   MCV 88.6  78.0 - 100.0 fL   MCH 29.5  26.0 - 34.0 pg   MCHC 33.3  30.0 - 36.0 g/dL   RDW 13.2  11.5 - 15.5 %   Platelets 172  150 - 400 K/uL  LACTIC ACID, PLASMA  Status: None   Collection Time    05/09/14  2:38 AM      Result Value Ref Range   Lactic Acid, Venous 0.7  0.5 - 2.2 mmol/L  GLUCOSE, CAPILLARY     Status: Abnormal   Collection Time    05/09/14  3:01 AM      Result Value Ref Range   Glucose-Capillary 120 (*) 70 - 99 mg/dL   Comment 1 Documented in Chart     Comment 2 Notify RN    GLUCOSE, CAPILLARY     Status: Abnormal   Collection Time    05/09/14  7:56 AM      Result Value Ref Range   Glucose-Capillary 127 (*) 70 - 99 mg/dL   Comment 1 Documented in Chart     Comment 2 Notify RN    GLUCOSE, CAPILLARY     Status: Abnormal   Collection Time    05/09/14 11:50 AM      Result Value Ref Range   Glucose-Capillary 135 (*) 70 - 99 mg/dL  GLUCOSE, CAPILLARY     Status: None   Collection Time    05/09/14  5:10 PM      Result Value Ref Range   Glucose-Capillary 75  70 - 99 mg/dL   Comment 1 Documented in Chart     Comment 2 Notify RN     GLUCOSE, CAPILLARY     Status: None   Collection Time    05/09/14  8:18 PM      Result Value Ref Range   Glucose-Capillary 88  70 - 99 mg/dL   Comment 1 Documented in Chart     Comment 2 Notify RN    GLUCOSE, CAPILLARY     Status: None   Collection Time    05/10/14 12:33 AM      Result Value Ref Range   Glucose-Capillary 86  70 - 99 mg/dL  BASIC METABOLIC PANEL     Status: None   Collection Time    05/10/14  3:25 AM      Result Value Ref Range   Sodium 140  137 - 147 mEq/L   Potassium 4.0  3.7 - 5.3 mEq/L   Chloride 102  96 - 112 mEq/L   CO2 23  19 - 32 mEq/L   Glucose, Bld 91  70 - 99 mg/dL   BUN 13  6 - 23 mg/dL   Creatinine, Ser 0.99  0.50 - 1.35 mg/dL   Calcium 8.4  8.4 - 10.5 mg/dL   GFR calc non Af Amer >90  >90 mL/min   GFR calc Af Amer >90  >90 mL/min   Comment: (NOTE)     The eGFR has been calculated using the CKD EPI equation.     This calculation has not been validated in all clinical situations.     eGFR's persistently <90 mL/min signify possible Chronic Kidney     Disease.  CBC WITH DIFFERENTIAL     Status: Abnormal   Collection Time    05/10/14  3:25 AM      Result Value Ref Range   WBC 9.4  4.0 - 10.5 K/uL   RBC 3.92 (*) 4.22 - 5.81 MIL/uL   Hemoglobin 11.4 (*) 13.0 - 17.0 g/dL   HCT 35.3 (*) 39.0 - 52.0 %   MCV 90.1  78.0 - 100.0 fL   MCH 29.1  26.0 - 34.0 pg   MCHC 32.3  30.0 - 36.0 g/dL   RDW 13.0  11.5 - 15.5 %   Platelets 188  150 - 400 K/uL   Neutrophils Relative % 68  43 - 77 %   Neutro Abs 6.4  1.7 - 7.7 K/uL   Lymphocytes Relative 20  12 - 46 %   Lymphs Abs 1.9  0.7 - 4.0 K/uL   Monocytes Relative 8  3 - 12 %   Monocytes Absolute 0.8  0.1 - 1.0 K/uL   Eosinophils Relative 4  0 - 5 %   Eosinophils Absolute 0.4  0.0 - 0.7 K/uL   Basophils Relative 0  0 - 1 %   Basophils Absolute 0.0  0.0 - 0.1 K/uL  GLUCOSE, CAPILLARY     Status: None   Collection Time    05/10/14  4:08 AM      Result Value Ref Range   Glucose-Capillary 78  70 - 99  mg/dL    Imaging / Studies: No results found.  Medications / Allergies: per chart  Antibiotics: Anti-infectives   Start     Dose/Rate Route Frequency Ordered Stop   05/08/14 1030  ciprofloxacin (CIPRO) IVPB 400 mg     400 mg 200 mL/hr over 60 Minutes Intravenous Every 12 hours 05/08/14 0924     05/07/14 2130  metroNIDAZOLE (FLAGYL) IVPB 500 mg     500 mg 100 mL/hr over 60 Minutes Intravenous Every 8 hours 05/07/14 2129     05/07/14 1730  piperacillin-tazobactam (ZOSYN) IVPB 3.375 g     3.375 g 100 mL/hr over 30 Minutes Intravenous  Once 05/07/14 1721 05/07/14 1952      Assessment Hx HTN, DM2, HLD, morbid obesity  Syncopal episode  Hypotension  Acute kidney injury  Cecal pneumatosis  PV gas  Right sided abdominal pain    Plan  Keep NPO for now Add on LFTs C/w empiric atbx, IV hydration, pain control  Erby Pian, [facility] Surgery Pager [phone] Office [phone]  05/10/2014 8:06 AM

## 2014-05-10 NOTE — Progress Notes (Signed)
Patient with localized RUQ pain, nausea. Reports that he has noted increased "indigestion" with diarrhea and flatulence over the last few months.  He has cut out greasy food from his diet, such as pizza because of these symptoms.  Overall his abdomen is fairly soft, but we need to rule out GB disease.  Ultrasound today.  If findings equivocal, consider HIDA scan.  Jack Wallace. Georgette Dover, MD, [facility] Surgery  General/ Trauma Surgery  05/10/2014 10:16 AM

## 2014-05-10 NOTE — Care Management Note (Addendum)
    Page 1 of 2   05/16/2014     4:56:44 PM CARE MANAGEMENT NOTE 05/16/2014  Patient:  Jack Wallace, Jack Wallace   Account Number:  [id number]  Date Initiated:  05/10/2014  Documentation initiated by:  Constantino Starace  Subjective/Objective Assessment:   Pt adm on 5/18 with RUQ abd pain, nausea, syncope.  PTA, pt independent of ADLS.     Action/Plan:   Will follow for dc needs as pt progresses.   Anticipated DC Date:  05/16/2014   Anticipated DC Plan:  [place]  CM consult      [facility] Choice  HOME HEALTH   Choice offered to / List presented to:  C-1 Patient   DME arranged  Vassie Moselle      DME agency  [place] arranged  [place].   Status of service:  Completed, signed off Medicare Important Message given?   (If response is "NO", the following Medicare IM given date fields will be blank) Date Medicare IM given:   Date Additional Medicare IM given:    Discharge Disposition:  [place]  Per UR Regulation:  Reviewed for med. necessity/level of care/duration of stay  If discussed at [place] of Stay Meetings, dates discussed:    Comments:  05/16/14 Ellan Lambert, RN, BSN [phone] Pt states he is now agreeable to [facility] care, as recommended by PT and RW.  Pt states his niece will be able to assist him as needed at dc.  Referral to [facility], per pt choice.  Start of care 24-48h post dc date.  5/25 11:11am debbie dowell rn,bsn spoke w pt. he does not want hhpt or rw. states he even walks to work. if he changes his mind he will let us know.

## 2014-05-10 NOTE — Progress Notes (Signed)
Subjective:   VSS.  He is still having RUQ abdominal pain that he reports has been worsening.  Also had some nausea last PM that was relieved with phenergan.  Lactic acid 1.1.  LFTs wnl.   Objective:   Vital signs in last 24 hours: Filed Vitals:   05/09/14 1612 05/09/14 2011 05/10/14 0411 05/10/14 1411  BP:  111/66 125/75 136/77  Pulse:  79 77 73  Temp:  99.4 F (37.4 C) 98.2 F (36.8 C) 98.5 F (36.9 C)  TempSrc:  Oral Oral Oral  Resp: 20 18 18 18   Height:      Weight:      SpO2:  95% 98% 97%    Weight: Filed Weights   05/07/14 1700 05/08/14 0020  Weight: 278 lb 10.6 oz (126.4 kg) 275 lb 12.7 oz (125.1 kg)    Ins/Outs:  Intake/Output Summary (Last 24 hours) at 05/10/14 1439 Last data filed at 05/10/14 1300  Gross per 24 hour  Intake    801 ml  Output   1250 ml  Net   -449 ml    Physical Exam: Constitutional: Vital signs reviewed.  Patient is lying in bed in no acute distress and cooperative with exam.   HEENT: [place]/AT; PERRL, EOMI, conjunctivae normal, no scleral icterus  Cardiovascular: RRR, no MRG Pulmonary/Chest: normal respiratory effort, no accessory muscle use, CTAB, no wheezes, rales, or rhonchi Abdominal: Obese. Soft. +bs, RUQ TTP without guarding or rebound Neurological: A&O x3, CN II-XII grossly intact; non-focal exam Extremities: 2+DP b/l, no C/C/E  Skin: Warm, dry and intact. No rash  Lab Results:  BMP:  Recent Labs Lab 05/09/14 0238 05/10/14 0325  NA 137 140  K 3.6* 4.0  CL 103 102  CO2 22 23  GLUCOSE 113* 91  BUN 15 13  CREATININE 1.07 0.99  CALCIUM 8.1* 8.4   Anion Gap:  15  CBC:  Recent Labs Lab 05/07/14 1601  05/09/14 0238 05/10/14 0325  WBC 12.0*  < > 10.8* 9.4  NEUTROABS 9.3*  --   --  6.4  HGB 12.4*  < > 11.4* 11.4*  HCT 37.0*  < > 34.2* 35.3*  MCV 88.7  < > 88.6 90.1  PLT 198  < > 172 188  < > = values in this interval not displayed.  Coagulation:  Recent Labs Lab 05/07/14 1601  LABPROT 13.0  INR 1.00     CBG:            Recent Labs Lab 05/09/14 1150 05/09/14 1710 05/09/14 2018 05/10/14 0033 05/10/14 0408 05/10/14 1121  GLUCAP 135* 75 88 86 78 99           HA1C:       Recent Labs Lab 05/08/14 0440  HGBA1C 6.5*    Lipid Panel: No results found for this basename: CHOL, HDL, LDLCALC, TRIG, CHOLHDL, LDLDIRECT,  in the last 168 hours  LFTs:  Recent Labs Lab 05/07/14 1601 05/10/14 0830  AST 15 21  ALT 14 12  ALKPHOS 39 53  BILITOT 0.5 0.9  PROT 6.6 6.2  ALBUMIN 4.1 2.8*    Pancreatic Enzymes:  Recent Labs Lab 05/07/14 1601 05/08/14 1714  LIPASE 43  --   AMYLASE  --  34    Lactic Acid/Procalcitonin:  Recent Labs Lab 05/08/14 1055 05/09/14 0238 05/10/14 0755  LATICACIDVEN 1.1 0.7 1.1    Ammonia: No results found for this basename: AMMONIA,  in the last 168 hours  Cardiac Enzymes:  Recent Labs Lab  05/07/14 2217 05/08/14 0440 05/08/14 1055  TROPONINI <0.30 <0.30 <0.30    EKG: EKG Interpretation  Date/Time:  Monday May 07 2014 15:59:33 EDT Ventricular Rate:  82 PR Interval:  177 QRS Duration: 101 QT Interval:  367 QTC Calculation: 429 R Axis:   61 Text Interpretation:  Sinus rhythm RSR' in V1 or V2, right VCD or RVH Abnormal inferior Q waves Borderline ST elevation, inferior leads Confirmed by ZAVITZ  MD, JOSHUA ([id number]) on 05/07/2014 4:12:03 PM   BNP:  Recent Labs Lab 05/07/14 1612  PROBNP 37.8    D-Dimer: No results found for this basename: DDIMER,  in the last 168 hours  Urinalysis:  Recent Labs Lab 05/07/14 2217  [place] 1.037*  PHURINE 5.0  GLUCOSEU NEGATIVE  HGBUR NEGATIVE  BILIRUBINUR NEGATIVE  KETONESUR NEGATIVE  PROTEINUR NEGATIVE  UROBILINOGEN 0.2  NITRITE NEGATIVE  LEUKOCYTESUR NEGATIVE    Micro Results: Recent Results (from the past 240 hour(s))  CULTURE, BLOOD (ROUTINE X 2)     Status: None   Collection Time    05/07/14  7:26 PM      Result Value Ref Range Status   Specimen  Description BLOOD HAND RIGHT   Final   Special Requests BOTTLES DRAWN AEROBIC ONLY 2CC   Final   Culture  Setup Time     Final   Value: 05/08/2014 00:40     Performed at [facility]   Culture     Final   Value:        BLOOD CULTURE RECEIVED NO GROWTH TO DATE CULTURE WILL BE HELD FOR 5 DAYS BEFORE ISSUING A FINAL NEGATIVE REPORT     Performed at [facility]   Report Status PENDING   Incomplete  CULTURE, BLOOD (ROUTINE X 2)     Status: None   Collection Time    05/07/14  7:30 PM      Result Value Ref Range Status   Specimen Description BLOOD ARM LEFT   Final   Special Requests BOTTLES DRAWN AEROBIC AND ANAEROBIC 6CC   Final   Culture  Setup Time     Final   Value: 05/08/2014 00:41     Performed at [facility]   Culture     Final   Value:        BLOOD CULTURE RECEIVED NO GROWTH TO DATE CULTURE WILL BE HELD FOR 5 DAYS BEFORE ISSUING A FINAL NEGATIVE REPORT     Performed at [facility]   Report Status PENDING   Incomplete  MRSA PCR SCREENING     Status: None   Collection Time    05/08/14 12:10 AM      Result Value Ref Range Status   MRSA by PCR NEGATIVE  NEGATIVE Final   Comment:            The GeneXpert MRSA Assay (FDA     approved for NASAL specimens     only), is one component of a     comprehensive MRSA colonization     surveillance program. It is not     intended to diagnose MRSA     infection nor to guide or     monitor treatment for     MRSA infections.    Blood Culture:    Component Value Date/Time   SDES BLOOD ARM LEFT 05/07/2014 1930    Studies/Results: No results found.  Medications:  Scheduled Meds: . ciprofloxacin  400 mg Intravenous Q12H  . gabapentin  600 mg Oral TID  . insulin aspart  0-9 Units Subcutaneous TID WC  . metronidazole  500 mg Intravenous Q8H   Continuous Infusions: . dextrose 125 mL/hr at 05/10/14 1005   PRN Meds: HYDROmorphone (DILAUDID) injection, ondansetron (ZOFRAN) IV,  ondansetron  Antibiotics: Antibiotics Given (last 72 hours)   Date/Time Action Medication Dose Rate   05/08/14 1114 Given   ciprofloxacin (CIPRO) IVPB 400 mg 400 mg 200 mL/hr   05/08/14 2252 Given   ciprofloxacin (CIPRO) IVPB 400 mg 400 mg 200 mL/hr   05/09/14 1027 Given   ciprofloxacin (CIPRO) IVPB 400 mg 400 mg 200 mL/hr   05/10/14 0419 Given   ciprofloxacin (CIPRO) IVPB 400 mg 400 mg 200 mL/hr   05/10/14 1401 Given   ciprofloxacin (CIPRO) IVPB 400 mg 400 mg 200 mL/hr      Day of Hospitalization: 3  Consults:  Surgery  Assessment/Plan:   Principal Problem:   Pneumatosis of intestines Active Problems:   Type II or unspecified type diabetes mellitus without mention of complication, uncontrolled   Hyperlipidemia   Diabetic neuropathy   Syncope   Hypotension   Abdominal pain, unspecified site   Unspecified protein-calorie malnutrition  Abdominal pain w/ pneumatosis on CT Pt reports pain is worsening.  Pt admitted w/ epigastric pain followed by episode of syncope. Surgery consulted and does not recommend surgical intervention at this time given VSS without peritonitis.  Lactic acid wnl therefore ischemic bowel unlikely-no peritoneal signs.  Pneumatosis has unclear etiology but may not be significant or even acute.  Possibly unrelated to his presentation.  Surgery following.  Amylase wnl.  LFTs wnl.  -Abd [place]; await results -continue cipro to flagyl IV (had 1 dose of zosyn) -BCx pending  -I/O's  -NPO (ice chips)  -surgery following, appreciate recs  Syncope VSS.  No further episodes.  Etiology most likely orthostatic hypotension.  Pt works at [employer] and reports that it was very hot and did not feel well.  He reports BP sometimes runs low because he feels that he is taking too much BP medication which is prescribed by a NP at his employer.  He needs a PCP. Trops x 3 neg.  Echo revealed LVEF of 55-60% with grade 1 diastolic dysfunction.  -continue IVF add  dextrose  AKI Resolved.  Pt w/ Cr of 2.06 on admission, baseline unknown.  Cr trended down after IVF.   -hold home prinzide   h/o HTN Pt on lisinopril-HCTZ 20-25 at home. Patient hypotensive on admission.  I am not sure if he even needs BP meds at this point. -hold home meds for now   Dyslipidemia On Pravachol 40 mg qhs at home.  -hold Pravastatin for now  DMII w/ neuropathy Lab Results  Component Value Date   HGBA1C 6.5* 05/08/2014  Takes metformin 1000 mg bid, liraglutide, glipizide 2.5 mg qd at home.  Sees Dr. Dwyane Dee (endocrinologist).  -Hold home meds  -SSI-S -cbgs  -continue neurontin   VTE PPx SCD  Disposition Disposition is deferred, awaiting improvement of current medical problems.  Anticipated discharge in approximately 1-2 day(s).     LOS: 3 days   Jones Bales, MD PGY-1, Internal Medicine Teaching Service [phone] (7AM-5PM Mon-Fri) 05/10/2014, 2:39 PM

## 2014-05-11 ENCOUNTER — Encounter (HOSPITAL_COMMUNITY): Payer: Self-pay | Admitting: Internal Medicine

## 2014-05-11 ENCOUNTER — Inpatient Hospital Stay (HOSPITAL_COMMUNITY): Payer: BC Managed Care – PPO

## 2014-05-11 DIAGNOSIS — K559 Vascular disorder of intestine, unspecified: Principal | ICD-10-CM

## 2014-05-11 DIAGNOSIS — R109 Unspecified abdominal pain: Secondary | ICD-10-CM

## 2014-05-11 LAB — GLUCOSE, CAPILLARY
GLUCOSE-CAPILLARY: 143 mg/dL — AB (ref 70–99)
Glucose-Capillary: 142 mg/dL — ABNORMAL HIGH (ref 70–99)
Glucose-Capillary: 128 mg/dL — ABNORMAL HIGH (ref 70–99)

## 2014-05-11 LAB — BASIC METABOLIC PANEL
BUN: 10 mg/dL (ref 6–23)
CHLORIDE: 101 meq/L (ref 96–112)
CO2: 23 mEq/L (ref 19–32)
Calcium: 8.6 mg/dL (ref 8.4–10.5)
Creatinine, Ser: 0.85 mg/dL (ref 0.50–1.35)
GFR calc non Af Amer: >90 mL/min (ref >90)
Glucose, Bld: 136 mg/dL — ABNORMAL HIGH (ref 70–99)
POTASSIUM: 3.3 meq/L — AB (ref 3.7–5.3)
Sodium: 138 mEq/L (ref 137–147)

## 2014-05-11 MED ORDER — STERILE WATER FOR INJECTION IJ SOLN
INTRAMUSCULAR | Status: AC
  Administered 2014-05-11: 10 mL
  Filled 2014-05-11: qty 10

## 2014-05-11 MED ORDER — TECHNETIUM TC 99M MEBROFENIN IV KIT
5.0000 | PACK | Freq: Once | INTRAVENOUS | Status: AC | PRN
  Administered 2014-05-11: 5 via INTRAVENOUS

## 2014-05-11 MED ORDER — SINCALIDE 5 MCG IJ SOLR
INTRAMUSCULAR | Status: AC
  Administered 2014-05-11: 6.25 ug
  Filled 2014-05-11: qty 10

## 2014-05-11 MED ORDER — IOHEXOL 350 MG/ML SOLN
100.0000 mL | Freq: Once | INTRAVENOUS | Status: AC | PRN
  Administered 2014-05-11: 100 mL via INTRAVENOUS

## 2014-05-11 MED ORDER — INSULIN ASPART 100 UNIT/ML ~~LOC~~ SOLN
0.0000 [IU] | Freq: Four times a day (QID) | SUBCUTANEOUS | Status: DC
  Administered 2014-05-11 – 2014-05-12 (×5): 1 [IU] via SUBCUTANEOUS
  Administered 2014-05-12 (×2): 2 [IU] via SUBCUTANEOUS
  Administered 2014-05-13 (×2): 1 [IU] via SUBCUTANEOUS
  Administered 2014-05-13: 3 [IU] via SUBCUTANEOUS
  Administered 2014-05-13 – 2014-05-14 (×2): 1 [IU] via SUBCUTANEOUS
  Administered 2014-05-14: 2 [IU] via SUBCUTANEOUS
  Administered 2014-05-14: 3 [IU] via SUBCUTANEOUS
  Administered 2014-05-15: 5 [IU] via SUBCUTANEOUS
  Administered 2014-05-15: 1 [IU] via SUBCUTANEOUS
  Administered 2014-05-15 – 2014-05-16 (×2): 2 [IU] via SUBCUTANEOUS
  Administered 2014-05-16: 3 [IU] via SUBCUTANEOUS
  Administered 2014-05-16 – 2014-05-17 (×2): 2 [IU] via SUBCUTANEOUS
  Administered 2014-05-17: 3 [IU] via SUBCUTANEOUS
  Administered 2014-05-17: 1 [IU] via SUBCUTANEOUS

## 2014-05-11 MED ORDER — POTASSIUM CHLORIDE 10 MEQ/100ML IV SOLN
10.0000 meq | INTRAVENOUS | Status: AC
  Administered 2014-05-11 (×4): 10 meq via INTRAVENOUS
  Filled 2014-05-11 (×4): qty 100

## 2014-05-11 NOTE — Progress Notes (Signed)
Patient ID: Jack Wallace, male   DOB: 1961-12-09, 53 y.o.   MRN: [id number]    Subjective: Pt still with RUQ abdominal pain.  Intermittent nausea  Objective: Vital signs in last 24 hours: Temp:  [98.5 F (36.9 C)-99.1 F (37.3 C)] 98.7 F (37.1 C) (05/22 0411) Pulse Rate:  [63-73] 63 (05/22 0411) Resp:  [17-20] 20 (05/22 0411) BP: (130-136)/(77-79) 134/79 mmHg (05/22 0411) SpO2:  [97 %-99 %] 98 % (05/22 0411) Last BM Date: 05/07/14  Intake/Output from previous day: 05/21 0701 - 05/22 0700 In: 2130.2 [P.O.:801; I.V.:1329.2] Out: 3025 [Urine:3025] Intake/Output this shift: Total I/O In: 1372.9 [I.V.:1372.9] Out: 400 [Urine:400]  PE: Abd: soft, tender mildly in RLQ and greater in RUQ, +BS, obese  Lab Results:   Recent Labs  05/09/14 0238 05/10/14 0325  WBC 10.8* 9.4  HGB 11.4* 11.4*  HCT 34.2* 35.3*  PLT 172 188   BMET  Recent Labs  05/10/14 0325 05/11/14 0400  NA 140 138  K 4.0 3.3*  CL 102 101  CO2 23 23  GLUCOSE 91 136*  BUN 13 10  CREATININE 0.99 0.85  CALCIUM 8.4 8.6   PT/INR No results found for this basename: LABPROT, INR,  in the last 72 hours CMP     Component Value Date/Time   NA 138 05/11/2014 0400   K 3.3* 05/11/2014 0400   CL 101 05/11/2014 0400   CO2 23 05/11/2014 0400   GLUCOSE 136* 05/11/2014 0400   BUN 10 05/11/2014 0400   CREATININE 0.85 05/11/2014 0400   CALCIUM 8.6 05/11/2014 0400   PROT 6.2 05/10/2014 0830   ALBUMIN 2.8* 05/10/2014 0830   AST 21 05/10/2014 0830   ALT 12 05/10/2014 0830   ALKPHOS 53 05/10/2014 0830   BILITOT 0.9 05/10/2014 0830   GFRNONAA >90 05/11/2014 0400   GFRAA >90 05/11/2014 0400   Lipase     Component Value Date/Time   LIPASE 43 05/07/2014 1601       Studies/Results: US Abdomen Limited  05/10/2014   CLINICAL DATA:  RIGHT upper quadrant abdominal pain, possible gallbladder disease, history hypertension, diabetes, hyperlipidemia  EXAM: US ABDOMEN LIMITED - RIGHT UPPER QUADRANT  COMPARISON:  CTA abdomen  05/07/2014  FINDINGS: Gallbladder:  Normally distended without stones or pericholecystic fluid. Gallbladder wall is upper normal in thickness. Patient demonstrates diffuse tenderness in the mid epigastric region including over the gallbladder.  Common bile duct:  Diameter: 2 mm diameter, normal  Liver:  Grossly normal appearance. Foci of portal venous gas identified within the liver on previous CT are not identified on obtained sonographic images.  No RIGHT upper quadrant free fluid.  IMPRESSION: Upper normal gallbladder wall thickness without definite stones or pericholecystic fluid.  Generalized tenderness in mid epigastric region including over gallbladder, recommend correlation with physical exam.   Electronically Signed   By: Lavonia Dana M.D.   On: 05/10/2014 15:35    Anti-infectives: Anti-infectives   Start     Dose/Rate Route Frequency Ordered Stop   05/08/14 1030  ciprofloxacin (CIPRO) IVPB 400 mg     400 mg 200 mL/hr over 60 Minutes Intravenous Every 12 hours 05/08/14 0924     05/07/14 2130  metroNIDAZOLE (FLAGYL) IVPB 500 mg     500 mg 100 mL/hr over 60 Minutes Intravenous Every 8 hours 05/07/14 2129     05/07/14 1730  piperacillin-tazobactam (ZOSYN) IVPB 3.375 g     3.375 g 100 mL/hr over 30 Minutes Intravenous  Once 05/07/14 1721 05/07/14 1952  Assessment/Plan Hx HTN, DM2, HLD, morbid obesity  Syncopal episode  Hypotension  Acute kidney injury  Cecal pneumatosis  PV gas  Right sided abdominal pain   Plan: 1. US shows maybe some mild wall thickening but no stones.  Will order a HIDA scan today to evaluate for further biliary disease, given greatest pain in RUQ and symptoms c/w biliary disease after eating.   LOS: 4 days    Henreitta Cea 05/11/2014, 8:50 AM Pager: [phone]

## 2014-05-11 NOTE — Consult Note (Signed)
Referring Provider: Medicine swervice Primary Care Physician:  No PCP Per Patient Primary Gastroenterologist:  None/unassigned  Reason for Consultation: Pneumatosis of cecum/persistent abdominal pain,nausea  HPI: Jack Wallace is a 53 y.o. male diabetic with hx of HTN, and morbid obesity who was admitted on 5/18 /15 after he developed abdominal pain on 05/06/2014 which became somewhat progressive. Apparently at work on the 18th he developed dizziness and asked EMS to be called and had a syncopal episode around the time EMS arrived. He was hypotensive with systolic pressure of 60 and volume repleted. Workup in the emergency room showed a leukocytosis with WBC of 13,000 range and CT of the abdomen/pelvis with CTA was done showing pneumatosis of the cecal wall and gas in the mesenteric veins and portal veins. His arteries were all felt to be patent and there was no clot identified. Lactic acid level was normal. He has been treated with antibiotics and surgery was consulted. Surgery did not feel that he needed to be explored at that time. He has been complaining of more localized right-sided abdominal pain and nausea and workup was pursued with upper abdominal ultrasound yesterday which was negative and HIDA scan today which is   in progress. We are asked to see him because he is failing to improve. Pt says he does not have as much pain as he did on admit, but definitely still hurting. No N/V and is hungry. No BM since admit. He says he feels best if lying down, and has increase in pain if sits up or tries to stand.. He has been NPO since admit. No prior hx of GI issues. CBC today shows WBC of 9.4 hemoglobin 11.4 hematocrit 35.3, hepatic panel has been unremarkable.   Past Medical History  Diagnosis Date  . Diabetes mellitus without complication   . Hypertension   . Hyperlipidemia     Past Surgical History  Procedure Laterality Date  . Nasal septum surgery      Prior to Admission medications     Medication Sig Start Date End Date Taking? Authorizing Provider  ALPHA LIPOIC ACID PO Take 1 capsule by mouth 3 (three) times daily.   Yes Historical Provider, MD  Ascorbic Acid (VITAMIN C PO) Take 1 tablet by mouth daily.   Yes Historical Provider, MD  aspirin 81 MG tablet Take 81 mg by mouth daily.   Yes Historical Provider, MD  Cholecalciferol (VITAMIN D PO) Take 1 tablet by mouth daily.   Yes Historical Provider, MD  gabapentin (NEURONTIN) 600 MG tablet Take 1 tablet (600 mg total) by mouth 3 (three) times daily. 02/19/14  Yes Elayne Snare, MD  glipiZIDE (GLUCOTROL) 5 MG tablet Take 1/2 tablet daily with breakfast 02/19/14  Yes Elayne Snare, MD  Liraglutide 18 MG/3ML SOPN Inject 1.2 mg into the skin daily. 02/19/14  Yes Elayne Snare, MD  lisinopril-hydrochlorothiazide (PRINZIDE,ZESTORETIC) 20-25 MG per tablet Take 0.5 tablets by mouth daily.    Yes Historical Provider, MD  metFORMIN (GLUCOPHAGE) 500 MG tablet Take 1,000 mg by mouth 2 (two) times daily with a meal.   Yes Historical Provider, MD  pravastatin (PRAVACHOL) 40 MG tablet Take 40 mg by mouth every morning.    Yes Historical Provider, MD    Current Facility-Administered Medications  Medication Dose Route Frequency Provider Last Rate Last Dose  . ciprofloxacin (CIPRO) IVPB 400 mg  400 mg Intravenous Q12H Megan Dort, PA-C   400 mg at 05/11/14 0558  . dextrose 5 % solution   Intravenous Continuous Olivia Mackie  Starr Sinclair, MD 125 mL/hr at 05/10/14 1005    . gabapentin (NEURONTIN) tablet 600 mg  600 mg Oral TID Otho Bellows, MD   600 mg at 05/11/14 1139  . HYDROmorphone (DILAUDID) injection 0.5-1 mg  0.5-1 mg Intravenous Q3H PRN Otho Bellows, MD   1 mg at 05/11/14 0849  . insulin aspart (novoLOG) injection 0-9 Units  0-9 Units Subcutaneous 4 times per day Madilyn Fireman, MD   1 Units at 05/11/14 1210  . metroNIDAZOLE (FLAGYL) IVPB 500 mg  500 mg Intravenous Q8H Otho Bellows, MD 100 mL/hr at 05/11/14 0909 500 mg at 05/11/14 0909  . ondansetron  (ZOFRAN) tablet 4 mg  4 mg Oral Q6H PRN Otho Bellows, MD       Or  . ondansetron [facility]) injection 4 mg  4 mg Intravenous Q6H PRN Otho Bellows, MD   4 mg at 05/09/14 1358  . promethazine (PHENERGAN) injection 12.5 mg  12.5 mg Intravenous Q6H PRN Otho Bellows, MD   12.5 mg at 05/10/14 2145    Allergies as of 05/07/2014  . (No Known Allergies)    Family History  Problem Relation Age of Onset  . Hypertension Mother   . Diabetes Maternal Grandfather     History   Social History  . Marital Status: Married    Spouse Name: N/A    Number of Children: N/A  . Years of Education: N/A   Occupational History  . Not on file.   Social History Main Topics  . Smoking status: Never Smoker   . Smokeless tobacco: Never Used  . Alcohol Use: No  . Drug Use: No  . Sexual Activity: Not on file   Other Topics Concern  . Not on file   Social History Narrative  . No narrative on file    Review of Systems: 10 point ROS negative except as in HPI .  Physical Exam: Vital signs in last 24 hours: Temp:  [98 F (36.7 C)-99.1 F (37.3 C)] 98 F (36.7 C) (05/22 1310) Pulse Rate:  [59-63] 59 (05/22 1310) Resp:  [17-20] 18 (05/22 1310) BP: (126-134)/(78-81) 126/81 mmHg (05/22 1310) SpO2:  [97 %-99 %] 97 % (05/22 1310) Last BM Date: 05/07/14  Not examined as yet-pt in xray General:   Alert,  Well-developed, well-nourished, pleasant and cooperative in NAD Head:  Normocephalic and atraumatic. Eyes:  Sclera clear, no icterus.   Conjunctiva pink. Ears:  Normal auditory acuity. Nose:  No deformity, discharge,  or lesions. Mouth:  No deformity or lesions.   Neck:  Supple; no masses or thyromegaly. Lungs:  Clear throughout to auscultation.   No wheezes, crackles, or rhonchi. Heart:  Regular rate and rhythm; no murmurs, clicks, rubs,  or gallops. Abdomen:  Soft,tenderRMQ and RLQ, no rebound, BS active,nonpalp mass or hsm.   Rectal:  Deferred  Msk:  Symmetrical without gross deformities.  . Pulses:  Normal pulses noted. Extremities:  Without clubbing or edema. Neurologic:  Alert and  oriented x4;  grossly normal neurologically. Skin:  Intact without significant lesions or rashes.. Psych:  Alert and cooperative. Normal mood and affect.  Intake/Output from previous day: 05/21 0701 - 05/22 0700 In: 2130.2 [P.O.:801; I.V.:1329.2] Out: 3025 [Urine:3025] Intake/Output this shift: Total I/O In: 2952.1 [I.V.:2452.1; IV Piggyback:500] Out: 2450 [Urine:2450]  Lab Results:  Recent Labs  05/08/14 1714 05/09/14 0238 05/10/14 0325  WBC 13.7* 10.8* 9.4  HGB 12.0* 11.4* 11.4*  HCT 35.9* 34.2* 35.3*  PLT 201 172  188   BMET  Recent Labs  05/09/14 0238 05/10/14 0325 05/11/14 0400  NA 137 140 138  K 3.6* 4.0 3.3*  CL 103 102 101  CO2 22 23 23   GLUCOSE 113* 91 136*  BUN 15 13 10   CREATININE 1.07 0.99 0.85  CALCIUM 8.1* 8.4 8.6   LFT  Recent Labs  05/10/14 0830  PROT 6.2  ALBUMIN 2.8*  AST 21  ALT 12  ALKPHOS 53  BILITOT 0.9  BILIDIR <0.2  IBILI NOT CALCULATED    IMPRESSION:  #1 53 yo male  with acute abdominal pain x 5 days, syncopal episode on 5/18, and found hypotensive.  CTA was consistent with bowel ischemia with pneumatosis of cecum, and mesenteric veins, and gas in portal vein. He has been on IV Cipro/Flagyl with decline in WBC but pt with persistent pain right abdomen. Suspect this is secondary to ischemic bowel. #2 AODM #3 hx HTN #4 Morbid obesity   PLAN:  Schedule for CT abd/pelvis tonight to reassess degree of pneumatosis, evaluate for progression of any ischemic changes. May need laparotomy. Follow up HIDA.    Amy S Esterwood  05/11/2014, 3:42 PM     Attending physician's note   I have taken a history, examined the patient and reviewed the chart. I agree with the Advanced Practitioner's note, impression and recommendations. Acute ischemic injury to cecum with pneumatosis, gas in mesenteric vein and gas in portal vein. Persistent pain  is likely related to ischemic bowel. Undergoing evaluation for possible cholecystitis with HIDA today. Surgery following, may need laparotomy. Repeat abd/pelvic CT today.   Ladene Artist, MD [facility]

## 2014-05-11 NOTE — Progress Notes (Signed)
Subjective:   VSS.  Still c/o RUQ abdominal pain that is sharp and comes and goes.  Reports has not improved much if at all.  Still on abx per surgery.  Requested HIDA scan.   Objective:   Vital signs in last 24 hours: Filed Vitals:   05/10/14 1411 05/10/14 2041 05/11/14 0411 05/11/14 1310  BP: 136/77 130/78 134/79 126/81  Pulse: 73 63 63 59  Temp: 98.5 F (36.9 C) 99.1 F (37.3 C) 98.7 F (37.1 C) 98 F (36.7 C)  TempSrc: Oral Oral Oral Oral  Resp: 18 17 20 18   Height:      Weight:      SpO2: 97% 99% 98% 97%    Weight: Filed Weights   05/07/14 1700 05/08/14 0020  Weight: 278 lb 10.6 oz (126.4 kg) 275 lb 12.7 oz (125.1 kg)    Ins/Outs:  Intake/Output Summary (Last 24 hours) at 05/11/14 2304 Last data filed at 05/11/14 1854  Gross per 24 hour  Intake 3452.08 ml  Output   5225 ml  Net -1772.92 ml    Physical Exam: Constitutional: Vital signs reviewed.  Patient is lying in bed in no acute distress and cooperative with exam.   HEENT: /AT; PERRL, EOMI, conjunctivae normal, no scleral icterus  Cardiovascular: RRR, no MRG Pulmonary/Chest: normal respiratory effort, no accessory muscle use, CTAB, no wheezes, rales, or rhonchi Abdominal: Obese. Soft. +bs, RUQ TTP without guarding or rebound Neurological: A&O x3, CN II-XII grossly intact; non-focal exam Extremities: 2+DP b/l, no C/C/E  Skin: Warm, dry and intact. No rash  Lab Results:  BMP:  Recent Labs Lab 05/10/14 0325 05/11/14 0400  NA 140 138  K 4.0 3.3*  CL 102 101  CO2 23 23  GLUCOSE 91 136*  BUN 13 10  CREATININE 0.99 0.85  CALCIUM 8.4 8.6   Anion Gap:  14  CBC:  Recent Labs Lab 05/07/14 1601  05/09/14 0238 05/10/14 0325  WBC 12.0*  < > 10.8* 9.4  NEUTROABS 9.3*  --   --  6.4  HGB 12.4*  < > 11.4* 11.4*  HCT 37.0*  < > 34.2* 35.3*  MCV 88.7  < > 88.6 90.1  PLT 198  < > 172 188  < > = values in this interval not displayed.  Coagulation:  Recent Labs Lab 05/07/14 1601  LABPROT  13.0  INR 1.00    CBG:            Recent Labs Lab 05/10/14 0408 05/10/14 1121 05/10/14 1649 05/11/14 0705 05/11/14 1134 05/11/14 1744  GLUCAP 78 99 96 143* 142* 128*           HA1C:       Recent Labs Lab 05/08/14 0440  HGBA1C 6.5*    Lipid Panel: No results found for this basename: CHOL, HDL, LDLCALC, TRIG, CHOLHDL, LDLDIRECT,  in the last 168 hours  LFTs:  Recent Labs Lab 05/07/14 1601 05/10/14 0830  AST 15 21  ALT 14 12  ALKPHOS 39 53  BILITOT 0.5 0.9  PROT 6.6 6.2  ALBUMIN 4.1 2.8*    Pancreatic Enzymes:  Recent Labs Lab 05/07/14 1601 05/08/14 1714  LIPASE 43  --   AMYLASE  --  34    Lactic Acid/Procalcitonin:  Recent Labs Lab 05/08/14 1055 05/09/14 0238 05/10/14 0755  LATICACIDVEN 1.1 0.7 1.1    Ammonia: No results found for this basename: AMMONIA,  in the last 168 hours  Cardiac Enzymes:  Recent Labs Lab 05/07/14 2217 05/08/14  0440 05/08/14 1055  TROPONINI <0.30 <0.30 <0.30    EKG: EKG Interpretation  Date/Time:  Monday May 07 2014 15:59:33 EDT Ventricular Rate:  82 PR Interval:  177 QRS Duration: 101 QT Interval:  367 QTC Calculation: 429 R Axis:   61 Text Interpretation:  Sinus rhythm RSR' in V1 or V2, right VCD or RVH Abnormal inferior Q waves Borderline ST elevation, inferior leads Confirmed by ZAVITZ  MD, JOSHUA ([id number]) on 05/07/2014 4:12:03 PM   BNP:  Recent Labs Lab 05/07/14 1612  PROBNP 37.8    D-Dimer: No results found for this basename: DDIMER,  in the last 168 hours  Urinalysis:  Recent Labs Lab 05/07/14 2217  [place] 1.037*  PHURINE 5.0  GLUCOSEU NEGATIVE  HGBUR NEGATIVE  BILIRUBINUR NEGATIVE  KETONESUR NEGATIVE  PROTEINUR NEGATIVE  UROBILINOGEN 0.2  NITRITE NEGATIVE  LEUKOCYTESUR NEGATIVE    Micro Results: Recent Results (from the past 240 hour(s))  CULTURE, BLOOD (ROUTINE X 2)     Status: None   Collection Time    05/07/14  7:26 PM      Result Value Ref Range  Status   Specimen Description BLOOD HAND RIGHT   Final   Special Requests BOTTLES DRAWN AEROBIC ONLY 2CC   Final   Culture  Setup Time     Final   Value: 05/08/2014 00:40     Performed at [facility]   Culture     Final   Value:        BLOOD CULTURE RECEIVED NO GROWTH TO DATE CULTURE WILL BE HELD FOR 5 DAYS BEFORE ISSUING A FINAL NEGATIVE REPORT     Performed at [facility]   Report Status PENDING   Incomplete  CULTURE, BLOOD (ROUTINE X 2)     Status: None   Collection Time    05/07/14  7:30 PM      Result Value Ref Range Status   Specimen Description BLOOD ARM LEFT   Final   Special Requests BOTTLES DRAWN AEROBIC AND ANAEROBIC 6CC   Final   Culture  Setup Time     Final   Value: 05/08/2014 00:41     Performed at [facility]   Culture     Final   Value:        BLOOD CULTURE RECEIVED NO GROWTH TO DATE CULTURE WILL BE HELD FOR 5 DAYS BEFORE ISSUING A FINAL NEGATIVE REPORT     Performed at [facility]   Report Status PENDING   Incomplete  MRSA PCR SCREENING     Status: None   Collection Time    05/08/14 12:10 AM      Result Value Ref Range Status   MRSA by PCR NEGATIVE  NEGATIVE Final   Comment:            The GeneXpert MRSA Assay (FDA     approved for NASAL specimens     only), is one component of a     comprehensive MRSA colonization     surveillance program. It is not     intended to diagnose MRSA     infection nor to guide or     monitor treatment for     MRSA infections.    Blood Culture:    Component Value Date/Time   SDES BLOOD ARM LEFT 05/07/2014 1930    Studies/Results: Nm Hepato W/eject Fract  05/11/2014   CLINICAL DATA:  Abdominal pain  EXAM: NUCLEAR MEDICINE HEPATOBILIARY IMAGING WITH GALLBLADDER  EF  TECHNIQUE: Sequential images of the abdomen were obtained out to 60 minutes following intravenous administration of radiopharmaceutical. After slow intravenous infusion of 2.5 micrograms Cholecystokinin, gallbladder ejection  fraction was determined.  RADIOPHARMACEUTICALS:  5.0 mCiTc-5m Choletec  COMPARISON:  Ultrasound, 05/10/2014  FINDINGS: There is homogeneous accumulation of radiotracer by the liver. Prompt excretion was noted into the intra and extrahepatic biliary tree. Small bowel activity seen early during the study, and gallbladder activity seen later during the exam indicating patency of the common bile duct and cystic duct respectively.  Gallbladder function was then assessed with gallbladder contraction stimulated with a CCK infusion. Gallbladder contraction is within the normal range with an ejection fraction calculated at 44%. At 30 min, normal ejection fraction is greater than 30%.  The patient did not experience symptoms during CCK infusion.  IMPRESSION: Normal hepatobiliary imaging with normal gallbladder function.   Electronically Signed   By: Lajean Manes M.D.   On: 05/11/2014 17:42   US Abdomen Limited  05/10/2014   CLINICAL DATA:  RIGHT upper quadrant abdominal pain, possible gallbladder disease, history hypertension, diabetes, hyperlipidemia  EXAM: US ABDOMEN LIMITED - RIGHT UPPER QUADRANT  COMPARISON:  CTA abdomen 05/07/2014  FINDINGS: Gallbladder:  Normally distended without stones or pericholecystic fluid. Gallbladder wall is upper normal in thickness. Patient demonstrates diffuse tenderness in the mid epigastric region including over the gallbladder.  Common bile duct:  Diameter: 2 mm diameter, normal  Liver:  Grossly normal appearance. Foci of portal venous gas identified within the liver on previous CT are not identified on obtained sonographic images.  No RIGHT upper quadrant free fluid.  IMPRESSION: Upper normal gallbladder wall thickness without definite stones or pericholecystic fluid.  Generalized tenderness in mid epigastric region including over gallbladder, recommend correlation with physical exam.   Electronically Signed   By: Lavonia Dana M.D.   On: 05/10/2014 15:35    Medications:  Scheduled  Meds: . ciprofloxacin  400 mg Intravenous Q12H  . gabapentin  600 mg Oral TID  . insulin aspart  0-9 Units Subcutaneous 4 times per day  . metronidazole  500 mg Intravenous Q8H   Continuous Infusions: . dextrose 100 mL/hr at 05/11/14 2048   PRN Meds: HYDROmorphone (DILAUDID) injection, ondansetron (ZOFRAN) IV, ondansetron, promethazine  Antibiotics: Antibiotics Given (last 72 hours)   Date/Time Action Medication Dose Rate   05/09/14 1027 Given   ciprofloxacin (CIPRO) IVPB 400 mg 400 mg 200 mL/hr   05/10/14 0419 Given   ciprofloxacin (CIPRO) IVPB 400 mg 400 mg 200 mL/hr   05/10/14 1401 Given   ciprofloxacin (CIPRO) IVPB 400 mg 400 mg 200 mL/hr   05/11/14 4854 Given   ciprofloxacin (CIPRO) IVPB 400 mg 400 mg 200 mL/hr   05/11/14 1746 Given   ciprofloxacin (CIPRO) IVPB 400 mg 400 mg 200 mL/hr      Day of Hospitalization: 4  Consults: Treatment Team:  Ladene Artist, MDSurgery  Assessment/Plan:   Principal Problem:   Pneumatosis of intestines Active Problems:   Type II or unspecified type diabetes mellitus without mention of complication, uncontrolled   Hyperlipidemia   Diabetic neuropathy   Syncope   Hypotension   Abdominal pain, unspecified site   Unspecified protein-calorie malnutrition   Abdominal pain, other specified site  Abdominal pain w/ pneumatosis on CT Pt reports pain not improved.  Pt admitted w/ epigastric pain followed by episode of syncope. Surgery consulted and does not recommend surgical intervention at this time given VSS without peritonitis.  Lactic acid wnl  therefore ischemic bowel unlikely-no peritoneal signs.  Pneumatosis has unclear etiology but may not be significant or even acute.  Possibly unrelated to his presentation.  Surgery following.  Amylase wnl.  LFTs wnl.  RUQ U/S and HIDA scan non-revealing.   -GI consult -continue cipro to flagyl IV (had 1 dose of zosyn) -BCx pending (NGTD) -I/O's  -NPO (ice chips)  -surgery following,  appreciate recs  Syncope VSS.  No further episodes.  Etiology most likely orthostatic hypotension.  Pt works at [employer] and reports that it was very hot and did not feel well.  He reports BP sometimes runs low because he feels that he is taking too much BP medication which is prescribed by a NP at his employer.  He needs a PCP. Trops x 3 neg.  Echo revealed LVEF of 55-60% with grade 1 diastolic dysfunction.  -continue IVF add dextrose  AKI Resolved.  Pt w/ Cr of 2.06 on admission, baseline unknown.  Cr trended down after IVF.   -hold home prinzide   h/o HTN Pt on lisinopril-HCTZ 20-25 at home. Patient hypotensive on admission.  I am not sure if he even needs BP meds at this point. -hold home meds for now   Dyslipidemia On Pravachol 40 mg qhs at home.  -hold Pravastatin for now  DMII w/ neuropathy Lab Results  Component Value Date   HGBA1C 6.5* 05/08/2014  Takes metformin 1000 mg bid, liraglutide, glipizide 2.5 mg qd at home.  Sees Dr. Dwyane Dee (endocrinologist).  -Hold home meds  -SSI-S -cbgs  -continue neurontin   VTE PPx SCD  Disposition Disposition is deferred, awaiting improvement of current medical problems.  Anticipated discharge in approximately 1-2 day(s).     LOS: 4 days   Jones Bales, MD PGY-1, Internal Medicine Teaching Service [phone] (7AM-5PM Mon-Fri) 05/11/2014, 11:04 PM

## 2014-05-11 NOTE — Progress Notes (Signed)
Seen and agree  

## 2014-05-11 NOTE — Progress Notes (Signed)
    Day 4 of stay      Patient name: Jack Wallace  Medical record number: [id number]  Date of birth: 11-Oct-1961   Seen and evaluated patient on morning rounds with the entire team. Pain abdomen still remains the same and he does not feel better. On exam - most tender RUQ and Right middle area, however he had no tenderness on the left side today which he has had before. Vitals remain stable. Patient feels nauseated.   Abdominal USG impression: Upper normal gallbladder wall thickness without definite stones or pericholecystic fluid. Generalized tenderness in mid epigastric region including over gallbladder, recommend correlation with physical exam.   Assessment and Plan    Day 4 - no significant improvement with cipro and flagyl. Surgery wants to do HIDA scan - I agree. I would also at this point like to get GI opinion.     I have discussed the care of this patient with my IM team residents. Please see the resident note for details.  Tynesha Free 05/11/2014, 12:54 PM.

## 2014-05-12 DIAGNOSIS — R5381 Other malaise: Secondary | ICD-10-CM

## 2014-05-12 DIAGNOSIS — K5289 Other specified noninfective gastroenteritis and colitis: Secondary | ICD-10-CM

## 2014-05-12 DIAGNOSIS — E876 Hypokalemia: Secondary | ICD-10-CM | POA: Diagnosis present

## 2014-05-12 LAB — CBC
HCT: 36.7 % — ABNORMAL LOW (ref 39.0–52.0)
HEMOGLOBIN: 11.9 g/dL — AB (ref 13.0–17.0)
MCH: 28.7 pg (ref 26.0–34.0)
MCHC: 32.4 g/dL (ref 30.0–36.0)
MCV: 88.4 fL (ref 78.0–100.0)
Platelets: 233 10*3/uL (ref 150–400)
RBC: 4.15 MIL/uL — AB (ref 4.22–5.81)
RDW: 12.8 % (ref 11.5–15.5)
WBC: 7.5 10*3/uL (ref 4.0–10.5)

## 2014-05-12 LAB — BASIC METABOLIC PANEL
BUN: 8 mg/dL (ref 6–23)
CHLORIDE: 100 meq/L (ref 96–112)
CO2: 28 mEq/L (ref 19–32)
Calcium: 8.5 mg/dL (ref 8.4–10.5)
Creatinine, Ser: 0.95 mg/dL (ref 0.50–1.35)
GFR calc non Af Amer: >90 mL/min (ref >90)
Glucose, Bld: 141 mg/dL — ABNORMAL HIGH (ref 70–99)
POTASSIUM: 3.2 meq/L — AB (ref 3.7–5.3)
SODIUM: 139 meq/L (ref 137–147)

## 2014-05-12 LAB — GLUCOSE, CAPILLARY
GLUCOSE-CAPILLARY: 131 mg/dL — AB (ref 70–99)
GLUCOSE-CAPILLARY: 155 mg/dL — AB (ref 70–99)
GLUCOSE-CAPILLARY: 161 mg/dL — AB (ref 70–99)
Glucose-Capillary: 136 mg/dL — ABNORMAL HIGH (ref 70–99)
Glucose-Capillary: 123 mg/dL — ABNORMAL HIGH (ref 70–99)
Glucose-Capillary: 133 mg/dL — ABNORMAL HIGH (ref 70–99)

## 2014-05-12 LAB — LACTIC ACID, PLASMA: LACTIC ACID, VENOUS: 0.7 mmol/L (ref 0.5–2.2)

## 2014-05-12 LAB — MAGNESIUM: Magnesium: 1 mg/dL — ABNORMAL LOW (ref 1.5–2.5)

## 2014-05-12 MED ORDER — MAGNESIUM SULFATE 4000MG/100ML IJ SOLN
4.0000 g | Freq: Once | INTRAMUSCULAR | Status: AC
  Administered 2014-05-12: 4 g via INTRAVENOUS
  Filled 2014-05-12: qty 100

## 2014-05-12 MED ORDER — POTASSIUM CHLORIDE 10 MEQ/100ML IV SOLN
10.0000 meq | INTRAVENOUS | Status: AC
  Administered 2014-05-12 (×4): 10 meq via INTRAVENOUS
  Filled 2014-05-12 (×4): qty 100

## 2014-05-12 NOTE — Progress Notes (Signed)
Subjective: CT scan results noted.  PT with improving abd pain and had 2 BMs this AM-dark in color  Objective: Vital signs in last 24 hours: Temp:  [98 F (36.7 C)-98.4 F (36.9 C)] 98.4 F (36.9 C) (05/23 0620) Pulse Rate:  [59-61] 60 (05/23 0620) Resp:  [18] 18 (05/23 0620) BP: (124-127)/(76-82) 127/82 mmHg (05/23 0620) SpO2:  [97 %-98 %] 97 % (05/23 0620) Last BM Date: 05/07/14  Intake/Output from previous day: 05/22 0701 - 05/23 0700 In: 3452.1 [I.V.:2652.1; IV Piggyback:800] Out: 4250 [Urine:4250] Intake/Output this shift: Total I/O In: 0  Out: 853 [Urine:850; Stool:3]  General appearance: alert and cooperative GI: soft, TTP R lateral abd, no rebound/guarding  Lab Results:   Recent Labs  05/10/14 0325 05/12/14 0310  WBC 9.4 7.5  HGB 11.4* 11.9*  HCT 35.3* 36.7*  PLT 188 233   BMET  Recent Labs  05/11/14 0400 05/12/14 0310  NA 138 139  K 3.3* 3.2*  CL 101 100  CO2 23 28  GLUCOSE 136* 141*  BUN 10 8  CREATININE 0.85 0.95  CALCIUM 8.6 8.5   PT/INR No results found for this basename: LABPROT, INR,  in the last 72 hours ABG No results found for this basename: PHART, PCO2, PO2, HCO3,  in the last 72 hours  Studies/Results: Nm Hepato W/eject Fract  05/11/2014   CLINICAL DATA:  Abdominal pain  EXAM: NUCLEAR MEDICINE HEPATOBILIARY IMAGING WITH GALLBLADDER EF  TECHNIQUE: Sequential images of the abdomen were obtained out to 60 minutes following intravenous administration of radiopharmaceutical. After slow intravenous infusion of 2.5 micrograms Cholecystokinin, gallbladder ejection fraction was determined.  RADIOPHARMACEUTICALS:  5.0 mCiTc-24m Choletec  COMPARISON:  Ultrasound, 05/10/2014  FINDINGS: There is homogeneous accumulation of radiotracer by the liver. Prompt excretion was noted into the intra and extrahepatic biliary tree. Small bowel activity seen early during the study, and gallbladder activity seen later during the exam indicating patency of the  common bile duct and cystic duct respectively.  Gallbladder function was then assessed with gallbladder contraction stimulated with a CCK infusion. Gallbladder contraction is within the normal range with an ejection fraction calculated at 44%. At 30 min, normal ejection fraction is greater than 30%.  The patient did not experience symptoms during CCK infusion.  IMPRESSION: Normal hepatobiliary imaging with normal gallbladder function.   Electronically Signed   By: Lajean Manes M.D.   On: 05/11/2014 17:42   US Abdomen Limited  05/10/2014   CLINICAL DATA:  RIGHT upper quadrant abdominal pain, possible gallbladder disease, history hypertension, diabetes, hyperlipidemia  EXAM: US ABDOMEN LIMITED - RIGHT UPPER QUADRANT  COMPARISON:  CTA abdomen 05/07/2014  FINDINGS: Gallbladder:  Normally distended without stones or pericholecystic fluid. Gallbladder wall is upper normal in thickness. Patient demonstrates diffuse tenderness in the mid epigastric region including over the gallbladder.  Common bile duct:  Diameter: 2 mm diameter, normal  Liver:  Grossly normal appearance. Foci of portal venous gas identified within the liver on previous CT are not identified on obtained sonographic images.  No RIGHT upper quadrant free fluid.  IMPRESSION: Upper normal gallbladder wall thickness without definite stones or pericholecystic fluid.  Generalized tenderness in mid epigastric region including over gallbladder, recommend correlation with physical exam.   Electronically Signed   By: Lavonia Dana M.D.   On: 05/10/2014 15:35   Ct Angio Abd/pel W/ And/or W/o  05/12/2014   CLINICAL DATA:  Follow-up ischemic bowel.  EXAM: CTA ABDOMEN AND PELVIS WITHOUT AND WITH CONTRAST  TECHNIQUE: Multidetector CT  imaging of the abdomen and pelvis was performed using the standard protocol during bolus administration of intravenous contrast. Multiplanar reconstructed images and MIPs were obtained and reviewed to evaluate the vascular anatomy.   CONTRAST:  164mL OMNIPAQUE IOHEXOL 350 MG/ML SOLN  COMPARISON:  CTA of the abdomen and pelvis performed 05/07/2014  FINDINGS: Right basilar airspace opacification likely reflects atelectasis. A small right pleural effusion is seen.  Previously noted pneumatosis has resolved. There is new inflammation involving the ascending colon, with significant associated mucosal thickening. Intraluminal contents along the ascending colon demonstrate increased attenuation. Would correlate for evidence of rectal blood, though this could simply reflect diluted contrast. Findings raise concern for worsened focal colitis, though underlying changes of ischemia are no longer visible. There is no evidence of perforation at this time.  Mild soft tissue inflammation tracks along the course of the appendix at the right lower quadrant, and trace free fluid is noted at the lower quadrants. The appendix remains normal in caliber, without evidence for appendicitis.  The liver is unremarkable in appearance; previously noted portal venous gas has resolved. There is mild nodularity of the splenic contour, without significant mass. There is apparent vicarious contrast excretion into the gallbladder; the gallbladder is otherwise unremarkable. The pancreas and adrenal glands are unremarkable.  A few bilateral renal parapelvic cysts are suggested. Mild perinephric stranding and perinephric fluid are seen bilaterally. There is no evidence of hydronephrosis. No renal or ureteral stones are seen.  The small bowel is unremarkable in appearance. The stomach is within normal limits. No acute vascular abnormalities are seen.  The bladder is mildly distended and grossly unremarkable. The prostate remains borderline normal in size. No inguinal lymphadenopathy is seen.  No acute osseous abnormalities are identified.  Review of the MIP images confirms the above findings.  IMPRESSION: 1. New inflammation involving the ascending colon, with significant associated  mucosal thickening. Previously noted pneumatosis has resolved. Intraorbital contents along the ascending colon demonstrate increased attenuation; would correlate for evidence of rectal blood, though this could simply reflect diluted contrast. Findings raise concern for worsened focal colitis, though underlying changes of ischemia are no longer visible. No evidence of perforation at this time. 2. Trace free fluid noted at the lower quadrants, with mild associated soft tissue inflammation. 3. Few bilateral renal parapelvic cysts suggested. 4. Right basilar airspace opacification likely reflects atelectasis. Small right pleural effusion seen.   Electronically Signed   By: Garald Balding M.D.   On: 05/12/2014 01:10    Anti-infectives: Anti-infectives   Start     Dose/Rate Route Frequency Ordered Stop   05/08/14 1030  ciprofloxacin (CIPRO) IVPB 400 mg     400 mg 200 mL/hr over 60 Minutes Intravenous Every 12 hours 05/08/14 0924     05/07/14 2130  metroNIDAZOLE (FLAGYL) IVPB 500 mg     500 mg 100 mL/hr over 60 Minutes Intravenous Every 8 hours 05/07/14 2129     05/07/14 1730  piperacillin-tazobactam (ZOSYN) IVPB 3.375 g     3.375 g 100 mL/hr over 30 Minutes Intravenous  Once 05/07/14 1721 05/07/14 1952      Assessment/Plan: Hx HTN, DM2, HLD, morbid obesity  Syncopal episode  Hypotension  Acute kidney injury  R ischemic colitis  Pt with improved abd pain and had two BMs this AM.   I would cont with abx at this time.   Will trial Full liq this AM.    LOS: 5 days    Ralene Ok 05/12/2014

## 2014-05-12 NOTE — Progress Notes (Signed)
Subjective:   VSS.  States RUQ abdominal pain has improved.  He had 2 BM yesterday and reports pain was better after this.  Still on abx per surgery.  HIDA scan neg.  lactic acid wnl.  CT shows colitis with resolution of pneumotosis.  No FH of colitis.  Has not been up much and experienced some dizziness when standing going to the chair.   Objective:   Vital signs in last 24 hours: Filed Vitals:   05/11/14 0411 05/11/14 1310 05/11/14 2013 05/12/14 0620  BP: 134/79 126/81 124/76 127/82  Pulse: 63 59 61 60  Temp: 98.7 F (37.1 C) 98 F (36.7 C) 98.1 F (36.7 C) 98.4 F (36.9 C)  TempSrc: Oral Oral Oral Oral  Resp: 20 18 18 18   Height:      Weight:      SpO2: 98% 97% 98% 97%    Weight: Filed Weights   05/07/14 1700 05/08/14 0020  Weight: 278 lb 10.6 oz (126.4 kg) 275 lb 12.7 oz (125.1 kg)    Ins/Outs:  Intake/Output Summary (Last 24 hours) at 05/12/14 1139 Last data filed at 05/12/14 0730  Gross per 24 hour  Intake 1454.16 ml  Output   4103 ml  Net -2648.84 ml    Physical Exam: Constitutional: Vital signs reviewed.  Patient is lying in bed in no acute distress and cooperative with exam.   HEENT: [place]/AT; PERRL, EOMI, conjunctivae normal, no scleral icterus  Cardiovascular: RRR, no MRG Pulmonary/Chest: normal respiratory effort, no accessory muscle use, CTAB, no wheezes, rales, or rhonchi Abdominal: Obese. Soft. +bs, improved RUQ TTP without guarding or rebound Neurological: A&O x3, CN II-XII grossly intact; non-focal exam Extremities: 2+DP b/l, no C/C/E  Skin: Warm, dry and intact. No rash  Lab Results:  BMP:  Recent Labs Lab 05/11/14 0400 05/12/14 0310  NA 138 139  K 3.3* 3.2*  CL 101 100  CO2 23 28  GLUCOSE 136* 141*  BUN 10 8  CREATININE 0.85 0.95  CALCIUM 8.6 8.5  MG  --  1.0*   Anion Gap:  11  CBC:  Recent Labs Lab 05/07/14 1601  05/10/14 0325 05/12/14 0310  WBC 12.0*  < > 9.4 7.5  NEUTROABS 9.3*  --  6.4  --   HGB 12.4*  < > 11.4*  11.9*  HCT 37.0*  < > 35.3* 36.7*  MCV 88.7  < > 90.1 88.4  PLT 198  < > 188 233  < > = values in this interval not displayed.  Coagulation:  Recent Labs Lab 05/07/14 1601  LABPROT 13.0  INR 1.00    CBG:            Recent Labs Lab 05/11/14 1134 05/11/14 1744 05/12/14 0009 05/12/14 0029 05/12/14 0614 05/12/14 1122  GLUCAP 142* 128* 131* 155* 136* 161*           HA1C:       Recent Labs Lab 05/08/14 0440  HGBA1C 6.5*   LFTs:  Recent Labs Lab 05/07/14 1601 05/10/14 0830  AST 15 21  ALT 14 12  ALKPHOS 39 53  BILITOT 0.5 0.9  PROT 6.6 6.2  ALBUMIN 4.1 2.8*    Pancreatic Enzymes:  Recent Labs Lab 05/07/14 1601 05/08/14 1714  LIPASE 43  --   AMYLASE  --  34    Lactic Acid/Procalcitonin:  Recent Labs Lab 05/09/14 0238 05/10/14 0755 05/12/14 0310  LATICACIDVEN 0.7 1.1 0.7    Ammonia: No results found for this basename: AMMONIA,  in the last 168 hours  Cardiac Enzymes:  Recent Labs Lab 05/07/14 2217 05/08/14 0440 05/08/14 1055  TROPONINI <0.30 <0.30 <0.30    EKG: EKG Interpretation  Date/Time:  Monday May 07 2014 15:59:33 EDT Ventricular Rate:  82 PR Interval:  177 QRS Duration: 101 QT Interval:  367 QTC Calculation: 429 R Axis:   61 Text Interpretation:  Sinus rhythm RSR' in V1 or V2, right VCD or RVH Abnormal inferior Q waves Borderline ST elevation, inferior leads Confirmed by ZAVITZ  MD, JOSHUA ([id number]) on 05/07/2014 4:12:03 PM   BNP:  Recent Labs Lab 05/07/14 1612  PROBNP 37.8    D-Dimer: No results found for this basename: DDIMER,  in the last 168 hours  Urinalysis:  Recent Labs Lab 05/07/14 2217  [place] 1.037*  PHURINE 5.0  GLUCOSEU NEGATIVE  HGBUR NEGATIVE  BILIRUBINUR NEGATIVE  KETONESUR NEGATIVE  PROTEINUR NEGATIVE  UROBILINOGEN 0.2  NITRITE NEGATIVE  LEUKOCYTESUR NEGATIVE    Micro Results: Recent Results (from the past 240 hour(s))  CULTURE, BLOOD (ROUTINE X 2)     Status:  None   Collection Time    05/07/14  7:26 PM      Result Value Ref Range Status   Specimen Description BLOOD HAND RIGHT   Final   Special Requests BOTTLES DRAWN AEROBIC ONLY 2CC   Final   Culture  Setup Time     Final   Value: 05/08/2014 00:40     Performed at [facility]   Culture     Final   Value:        BLOOD CULTURE RECEIVED NO GROWTH TO DATE CULTURE WILL BE HELD FOR 5 DAYS BEFORE ISSUING A FINAL NEGATIVE REPORT     Performed at [facility]   Report Status PENDING   Incomplete  CULTURE, BLOOD (ROUTINE X 2)     Status: None   Collection Time    05/07/14  7:30 PM      Result Value Ref Range Status   Specimen Description BLOOD ARM LEFT   Final   Special Requests BOTTLES DRAWN AEROBIC AND ANAEROBIC 6CC   Final   Culture  Setup Time     Final   Value: 05/08/2014 00:41     Performed at [facility]   Culture     Final   Value:        BLOOD CULTURE RECEIVED NO GROWTH TO DATE CULTURE WILL BE HELD FOR 5 DAYS BEFORE ISSUING A FINAL NEGATIVE REPORT     Performed at [facility]   Report Status PENDING   Incomplete  MRSA PCR SCREENING     Status: None   Collection Time    05/08/14 12:10 AM      Result Value Ref Range Status   MRSA by PCR NEGATIVE  NEGATIVE Final   Comment:            The GeneXpert MRSA Assay (FDA     approved for NASAL specimens     only), is one component of a     comprehensive MRSA colonization     surveillance program. It is not     intended to diagnose MRSA     infection nor to guide or     monitor treatment for     MRSA infections.    Blood Culture:    Component Value Date/Time   SDES BLOOD ARM LEFT 05/07/2014 1930    Studies/Results: Nm Hepato W/eject Fract  05/11/2014  CLINICAL DATA:  Abdominal pain  EXAM: NUCLEAR MEDICINE HEPATOBILIARY IMAGING WITH GALLBLADDER EF  TECHNIQUE: Sequential images of the abdomen were obtained out to 60 minutes following intravenous administration of radiopharmaceutical. After slow  intravenous infusion of 2.5 micrograms Cholecystokinin, gallbladder ejection fraction was determined.  RADIOPHARMACEUTICALS:  5.0 mCiTc-65m Choletec  COMPARISON:  Ultrasound, 05/10/2014  FINDINGS: There is homogeneous accumulation of radiotracer by the liver. Prompt excretion was noted into the intra and extrahepatic biliary tree. Small bowel activity seen early during the study, and gallbladder activity seen later during the exam indicating patency of the common bile duct and cystic duct respectively.  Gallbladder function was then assessed with gallbladder contraction stimulated with a CCK infusion. Gallbladder contraction is within the normal range with an ejection fraction calculated at 44%. At 30 min, normal ejection fraction is greater than 30%.  The patient did not experience symptoms during CCK infusion.  IMPRESSION: Normal hepatobiliary imaging with normal gallbladder function.   Electronically Signed   By: Lajean Manes M.D.   On: 05/11/2014 17:42   US Abdomen Limited  05/10/2014   CLINICAL DATA:  RIGHT upper quadrant abdominal pain, possible gallbladder disease, history hypertension, diabetes, hyperlipidemia  EXAM: US ABDOMEN LIMITED - RIGHT UPPER QUADRANT  COMPARISON:  CTA abdomen 05/07/2014  FINDINGS: Gallbladder:  Normally distended without stones or pericholecystic fluid. Gallbladder wall is upper normal in thickness. Patient demonstrates diffuse tenderness in the mid epigastric region including over the gallbladder.  Common bile duct:  Diameter: 2 mm diameter, normal  Liver:  Grossly normal appearance. Foci of portal venous gas identified within the liver on previous CT are not identified on obtained sonographic images.  No RIGHT upper quadrant free fluid.  IMPRESSION: Upper normal gallbladder wall thickness without definite stones or pericholecystic fluid.  Generalized tenderness in mid epigastric region including over gallbladder, recommend correlation with physical exam.   Electronically Signed    By: Lavonia Dana M.D.   On: 05/10/2014 15:35   Ct Angio Abd/pel W/ And/or W/o  05/12/2014   CLINICAL DATA:  Follow-up ischemic bowel.  EXAM: CTA ABDOMEN AND PELVIS WITHOUT AND WITH CONTRAST  TECHNIQUE: Multidetector CT imaging of the abdomen and pelvis was performed using the standard protocol during bolus administration of intravenous contrast. Multiplanar reconstructed images and MIPs were obtained and reviewed to evaluate the vascular anatomy.  CONTRAST:  17mL OMNIPAQUE IOHEXOL 350 MG/ML SOLN  COMPARISON:  CTA of the abdomen and pelvis performed 05/07/2014  FINDINGS: Right basilar airspace opacification likely reflects atelectasis. A small right pleural effusion is seen.  Previously noted pneumatosis has resolved. There is new inflammation involving the ascending colon, with significant associated mucosal thickening. Intraluminal contents along the ascending colon demonstrate increased attenuation. Would correlate for evidence of rectal blood, though this could simply reflect diluted contrast. Findings raise concern for worsened focal colitis, though underlying changes of ischemia are no longer visible. There is no evidence of perforation at this time.  Mild soft tissue inflammation tracks along the course of the appendix at the right lower quadrant, and trace free fluid is noted at the lower quadrants. The appendix remains normal in caliber, without evidence for appendicitis.  The liver is unremarkable in appearance; previously noted portal venous gas has resolved. There is mild nodularity of the splenic contour, without significant mass. There is apparent vicarious contrast excretion into the gallbladder; the gallbladder is otherwise unremarkable. The pancreas and adrenal glands are unremarkable.  A few bilateral renal parapelvic cysts are suggested. Mild perinephric stranding and perinephric  fluid are seen bilaterally. There is no evidence of hydronephrosis. No renal or ureteral stones are seen.  The  small bowel is unremarkable in appearance. The stomach is within normal limits. No acute vascular abnormalities are seen.  The bladder is mildly distended and grossly unremarkable. The prostate remains borderline normal in size. No inguinal lymphadenopathy is seen.  No acute osseous abnormalities are identified.  Review of the MIP images confirms the above findings.  IMPRESSION: 1. New inflammation involving the ascending colon, with significant associated mucosal thickening. Previously noted pneumatosis has resolved. Intraorbital contents along the ascending colon demonstrate increased attenuation; would correlate for evidence of rectal blood, though this could simply reflect diluted contrast. Findings raise concern for worsened focal colitis, though underlying changes of ischemia are no longer visible. No evidence of perforation at this time. 2. Trace free fluid noted at the lower quadrants, with mild associated soft tissue inflammation. 3. Few bilateral renal parapelvic cysts suggested. 4. Right basilar airspace opacification likely reflects atelectasis. Small right pleural effusion seen.   Electronically Signed   By: Garald Balding M.D.   On: 05/12/2014 01:10    Medications:  Scheduled Meds: . ciprofloxacin  400 mg Intravenous Q12H  . gabapentin  600 mg Oral TID  . insulin aspart  0-9 Units Subcutaneous 4 times per day  . magnesium sulfate 1 - 4 g bolus IVPB  4 g Intravenous Once  . metronidazole  500 mg Intravenous Q8H   Continuous Infusions: . dextrose 50 mL/hr at 05/12/14 1035   PRN Meds: HYDROmorphone (DILAUDID) injection, ondansetron (ZOFRAN) IV, ondansetron, promethazine  Antibiotics: Antibiotics Given (last 72 hours)   Date/Time Action Medication Dose Rate   05/10/14 0419 Given   ciprofloxacin (CIPRO) IVPB 400 mg 400 mg 200 mL/hr   05/10/14 1401 Given   ciprofloxacin (CIPRO) IVPB 400 mg 400 mg 200 mL/hr   05/11/14 [id number] Given   ciprofloxacin (CIPRO) IVPB 400 mg 400 mg 200 mL/hr    05/11/14 1746 Given   ciprofloxacin (CIPRO) IVPB 400 mg 400 mg 200 mL/hr   05/11/14 2318 Given   ciprofloxacin (CIPRO) IVPB 400 mg 400 mg 200 mL/hr   05/12/14 1025 Given   ciprofloxacin (CIPRO) IVPB 400 mg 400 mg 200 mL/hr      Day of Hospitalization: 5  Consults: Treatment Team:  Ladene Artist, MDSurgery  Assessment/Plan:   Principal Problem:   Pneumatosis of intestines Active Problems:   Type II or unspecified type diabetes mellitus without mention of complication, uncontrolled   Hyperlipidemia   Diabetic neuropathy   Syncope   Hypotension   Abdominal pain, unspecified site   Unspecified protein-calorie malnutrition   Abdominal pain, other specified site   Ischemic colitis   Hypokalemia   Hypomagnesemia  Abdominal pain w/ associated colitis on CT Pt reports pain improved this AM.  Pt admitted w/ epigastric pain followed by episode of syncope. Surgery consulted and does not recommend surgical intervention at this time given VSS without peritonitis.  Lactic acid wnl therefore ischemic bowel unlikely-no peritoneal signs.  Pneumatosis has resolved.  Surgery following.  Amylase, lactic acid wnl.  LFTs wnl.  RUQ U/S and HIDA scan non-revealing.  Repeat CT yesterday revealed resolution of pneumatosis with worsened colitis.  Spoke with surgery and still conservative management.  GI advanced diet with outpatient colonoscopy in several weeks to r/o other causes of colitis.  -continue cipro to flagyl IV (had 1 dose of zosyn) -BCx pending (NGTD) -I/O's  -advance diet to clears (per surgery) -surgery, GI  following appreciate recs -d/c dextrose   Syncope VSS.  No further episodes.  Etiology most likely orthostatic hypotension.  Pt works at [employer] and reports that it was very hot and did not feel well.  He reports BP sometimes runs low because he feels that he is taking too much BP medication which is prescribed by a NP at his employer.  He needs a PCP. Trops x 3 neg.  Echo  revealed LVEF of 55-60% with grade 1 diastolic dysfunction.  -PT consulted   AKI Resolved.  Pt w/ Cr of 2.06 on admission, baseline unknown.  Cr trended down after IVF.   -d/c home prinzide   h/o HTN Pt on lisinopril-HCTZ 20-25 at home with hypotensive on admission.  Would not give BP meds at this point. -d/c hom meds  Dyslipidemia On Pravachol 40 mg qhs at home.  -hold Pravastatin for now  DMII w/ neuropathy Lab Results  Component Value Date   HGBA1C 6.5* 05/08/2014  Takes metformin 1000 mg bid, liraglutide, glipizide 2.5 mg qd at home.  Sees Dr. Dwyane Dee (endocrinologist).  -Hold home meds  -SSI-S -cbgs  -continue neurontin   Deconditioning -PT consult  VTE PPx SCD  Disposition Disposition is deferred, awaiting improvement of current medical problems.  Anticipated discharge in approximately 1-2 day(s).     LOS: 5 days   Jones Bales, MD PGY-1, Internal Medicine Teaching Service [phone] (7AM-5PM Mon-Fri) 05/12/2014, 11:39 AM

## 2014-05-12 NOTE — Progress Notes (Signed)
Internal Medicine Attending  Date: 05/12/2014  Patient name: Jack Wallace Medical record number: [id number] Date of birth: 29-Dec-1960 Age: 53 y.o. Gender: male  I saw and evaluated the patient. I discussed patient and reviewed the resident's note by Dr. Gordy Levan, and I agree with the resident's findings and plans as documented in her note, with the following additional comments.  Patient reports that his abdominal pain is somewhat better today; exam shows mild right lower abdominal tenderness.  CT scan of the abdomen and pelvis showed new inflammation involving the ascending colon, with significant associated mucosal thickening; the previously noted pneumatosis has resolved.  Both GI and surgery consults feel that this is consistent with right ischemic colitis, and recommend continuing antibiotics and advancing diet slowly as tolerated, with plan for eventual outpatient colonoscopy in a few weeks.

## 2014-05-12 NOTE — Progress Notes (Signed)
Patient ID: Jack Wallace, male   DOB: 06-Mar-1961, 53 y.o.   MRN: [id number]     Objective:  Feeling better, had 2 BM's this am, dark. Hungry. Has not eaten for several days and has not been out of bed. HIDA scan normal CT shows a right sided colitis consistent with ischemic colitis  Vital signs:  BP 127/82 P 60 RR 18 T 98.4 General:   Alert, well-developed, WM  in NAD Heart:  Regular rate and rhythm; no murmurs Pulm: clear Abdomen:  Soft, tender RLQ-mildly, and nondistended. Normal bowel sounds, without guarding, and without rebound.   Extremities:  Without edema. Neurologic:  Alert and  oriented x4;  grossly normal neurologically. Psych:  Alert and cooperative. Normal mood and affect.  Intake/Output from previous day: 05/22 0701 - 05/23 0700 In: 3452.1 [I.V.:2652.1; IV Piggyback:800] Out: 4250 [Urine:4250] Intake/Output this shift: Total I/O In: 0  Out: 853 [Urine:850; Stool:3]  Lab Results:  Recent Labs  05/10/14 0325 05/12/14 0310  WBC 9.4 7.5  HGB 11.4* 11.9*  HCT 35.3* 36.7*  PLT 188 233   BMET  Recent Labs  05/10/14 0325 05/11/14 0400 05/12/14 0310  NA 140 138 139  K 4.0 3.3* 3.2*  CL 102 101 100  CO2 23 23 28   GLUCOSE 91 136* 141*  BUN 13 10 8   CREATININE 0.99 0.85 0.95  CALCIUM 8.4 8.6 8.5   LFT  Recent Labs  05/10/14 0830  PROT 6.2  ALBUMIN 2.8*  AST 21  ALT 12  ALKPHOS 53  BILITOT 0.9  BILIDIR <0.2  IBILI NOT CALCULATED   Abd/pelvic CT 5/22: 1. New inflammation involving the ascending colon, with significant  associated mucosal thickening. Previously noted pneumatosis has  resolved. Intraorbital contents along the ascending colon  demonstrate increased attenuation; would correlate for evidence of  rectal blood, though this could simply reflect diluted contrast.  Findings raise concern for worsened focal colitis, though underlying  changes of ischemia are no longer visible. No evidence of  perforation at this time.  2. Trace free  fluid noted at the lower quadrants, with mild  associated soft tissue inflammation.  3. Few bilateral renal parapelvic cysts suggested.  4. Right basilar airspace opacification likely reflects atelectasis.  Small right pleural effusion seen.   Assessment / Plan: #1  53 yo male with acute abdominal pain followed by syncope - course most consistent with an acute ischemic event to right colon ? SMA origin- vessels patent on CTA on admit. He is improving, antibiotics are empiric- would treat x 10 days total Advance diet as tolerates today Get OOB He will need to follow up with GI outpt and have colonoscopy Hopefully can be discharged next 1-2 days   Principal Problem:   Pneumatosis of intestines Active Problems:   Type II or unspecified type diabetes mellitus without mention of complication, uncontrolled   Hyperlipidemia   Diabetic neuropathy   Syncope   Hypotension   Abdominal pain, unspecified site   Unspecified protein-calorie malnutrition   Abdominal pain, other specified site   Ischemic colitis   Hypokalemia   Hypomagnesemia    LOS: 5 days   Amy S Esterwood  05/12/2014, 10:11 AM     Attending physician's note   I have taken an interval history, reviewed the chart and examined the patient. I agree with the Advanced Practitioner's note, impression and recommendations. CT scan reviewed. Right colon ischemic colitis is clinically improving. Continue antibiotics. Advance diet slowly as tolerated. Outpatient colonoscopy in several weeks  to R/O other potential causes of colitis.  Pricilla Riffle. Fuller Plan, MD [facility]

## 2014-05-12 NOTE — Evaluation (Signed)
Physical Therapy Evaluation Patient Details Name: Jack Wallace MRN: [id number] DOB: 1961-01-22 Today's Date: 05/12/2014   History of Present Illness  [name] is a 53 y.o. male w/ PMHx of HTN, HLD, DM type II, presented to the ED after an episode of syncope and abdominal pain. He was admitted with dx of pneumatosis of intestines.  Clinical Impression  Pt admitted with pneumatosis of intestines. Pt currently with functional limitations due to the deficits listed below (see PT Problem List). Deconditioning present due to extended hospitalization. Pt will benefit from skilled PT to increase their independence and safety with mobility to allow discharge to the venue listed below. Recommending HHPT and RW upon d/c.  However, it is the opinion of this PT that (if hospitalization lasts for an additional 3+ days) with mobility initiated by PT and nsg, pt demo good potential for progressing to [facility] vs no A.D. For d/c.     Follow Up Recommendations Home health PT;Supervision - Intermittent    Equipment Recommendations  Rolling walker with 5" wheels    Recommendations for Other Services       Precautions / Restrictions Precautions Precautions: Fall      Mobility  Bed Mobility Overal bed mobility: Modified Independent             General bed mobility comments: with use of bedrail, HOB flat  Transfers Overall transfer level: Needs assistance Equipment used: Rolling walker (2 wheeled) Transfers: Sit to/from [facility] Sit to Stand: Min guard Stand pivot transfers: Min guard       General transfer comment: verbal cues for hand placement, safety  Ambulation/Gait Ambulation/Gait assistance: Min guard Ambulation Distance (Feet): 200 Feet Assistive device: Rolling walker (2 wheeled) Gait Pattern/deviations: Decreased stride length Gait velocity: decreased      Stairs            Wheelchair Mobility    Modified Rankin (Stroke Patients Only)        Balance                                             Pertinent Vitals/Pain 3/10    Home Living Family/patient expects to be discharged to:: Private residence Living Arrangements: Alone Available Help at Discharge: Friend(s);Family;Available PRN/intermittently Type of Home: Apartment Home Access: Stairs to enter Entrance Stairs-Rails: Right;Left;Can reach both Entrance Stairs-Number of Steps: 1 flight Home Layout: One level Home Equipment: None      Prior Function Level of Independence: Independent               Hand Dominance        Extremity/Trunk Assessment                         Communication   Communication: No difficulties  Cognition Arousal/Alertness: Awake/alert Behavior During Therapy: WFL for tasks assessed/performed Overall Cognitive Status: Within Functional Limits for tasks assessed                      General Comments      Exercises        Assessment/Plan    PT Assessment Patient needs continued PT services  PT Diagnosis Difficulty walking;Generalized weakness   PT Problem List Decreased strength;Decreased activity tolerance;Decreased mobility;Decreased knowledge of use of DME;Pain;Decreased safety awareness  PT Treatment Interventions DME instruction;Gait training;Stair training;Functional  mobility training;Therapeutic activities;Therapeutic exercise;Patient/family education;Balance training   PT Goals (Current goals can be found in the Care Plan section) Acute Rehab PT Goals Patient Stated Goal: home, return to work PT Goal Formulation: With patient Time For Goal Achievement: 05/26/14 Potential to Achieve Goals: Good    Frequency Min 3X/week   Barriers to discharge        Co-evaluation               End of Session Equipment Utilized During Treatment: Gait belt Activity Tolerance: Patient tolerated treatment well Patient left: in chair;with call bell/phone within reach;with  family/visitor present Nurse Communication: Mobility status         Time: 1435-1457 PT Time Calculation (min): 22 min   Charges:   PT Evaluation $Initial PT Evaluation Tier I: 1 Procedure PT Treatments $Gait Training: 8-22 mins   PT G Codes:          Philippa Sicks 05/12/2014, 3:27 PM  Lorrin Goodell, PT  Office # [phone] Pager [phone]

## 2014-05-13 DIAGNOSIS — E46 Unspecified protein-calorie malnutrition: Secondary | ICD-10-CM

## 2014-05-13 DIAGNOSIS — I959 Hypotension, unspecified: Secondary | ICD-10-CM

## 2014-05-13 LAB — BASIC METABOLIC PANEL
BUN: 7 mg/dL (ref 6–23)
CO2: 26 mEq/L (ref 19–32)
Calcium: 9.5 mg/dL (ref 8.4–10.5)
Chloride: 97 mEq/L (ref 96–112)
Creatinine, Ser: 0.91 mg/dL (ref 0.50–1.35)
GFR calc Af Amer: >90 mL/min (ref >90)
GFR calc non Af Amer: >90 mL/min (ref >90)
GLUCOSE: 128 mg/dL — AB (ref 70–99)
POTASSIUM: 3.9 meq/L (ref 3.7–5.3)
Sodium: 137 mEq/L (ref 137–147)

## 2014-05-13 LAB — GLUCOSE, CAPILLARY
GLUCOSE-CAPILLARY: 130 mg/dL — AB (ref 70–99)
GLUCOSE-CAPILLARY: 91 mg/dL (ref 70–99)
Glucose-Capillary: 123 mg/dL — ABNORMAL HIGH (ref 70–99)
Glucose-Capillary: 223 mg/dL — ABNORMAL HIGH (ref 70–99)

## 2014-05-13 LAB — PHOSPHORUS: Phosphorus: 3.5 mg/dL (ref 2.3–4.6)

## 2014-05-13 LAB — MAGNESIUM: Magnesium: 1.6 mg/dL (ref 1.5–2.5)

## 2014-05-13 MED ORDER — MAGNESIUM SULFATE 40 MG/ML IJ SOLN
2.0000 g | Freq: Once | INTRAMUSCULAR | Status: AC
  Administered 2014-05-13: 2 g via INTRAVENOUS
  Filled 2014-05-13: qty 50

## 2014-05-13 MED ORDER — PANTOPRAZOLE SODIUM 40 MG IV SOLR
40.0000 mg | INTRAVENOUS | Status: DC
  Administered 2014-05-13 – 2014-05-14 (×2): 40 mg via INTRAVENOUS
  Filled 2014-05-13 (×4): qty 40

## 2014-05-13 NOTE — Progress Notes (Signed)
Progress Note   Subjective  Overall improved. Had RUQ pain with tomato soup which has now resolved.    Objective  Vital signs in last 24 hours: Temp:  [97.5 F (36.4 C)-98.1 F (36.7 C)] 97.5 F (36.4 C) (05/24 0354) Pulse Rate:  [64-77] 64 (05/24 0354) Resp:  [18] 18 (05/24 0354) BP: (125-138)/(79-89) 125/79 mmHg (05/24 0354) SpO2:  [96 %-100 %] 100 % (05/24 0354) Last BM Date: 05/12/14  General:   Alert, well-developed, white male in NAD Heart:  Regular rate and rhythm; no murmurs Abdomen:  Soft, minimal right abd tenderness and nondistended. Normal bowel sounds, without guarding, and without rebound.   Extremities:  Without edema. Neurologic:  Alert and  oriented x4;  grossly normal neurologically. Psych:  Alert and cooperative. Normal mood and affect.  Intake/Output from previous day: 05/23 0701 - 05/24 0700 In: 240 [P.O.:240] Out: 2753 [Urine:2750; Stool:3] Intake/Output this shift: Total I/O In: 240 [P.O.:240] Out: 800 [Urine:800]  Lab Results:  Recent Labs  05/12/14 0310  WBC 7.5  HGB 11.9*  HCT 36.7*  PLT 233   BMET  Recent Labs  05/11/14 0400 05/12/14 0310 05/13/14 0055  NA 138 139 137  K 3.3* 3.2* 3.9  CL 101 100 97  CO2 23 28 26   GLUCOSE 136* 141* 128*  BUN 10 8 7   CREATININE 0.85 0.95 0.91  CALCIUM 8.6 8.5 9.5   Studies/Results: Nm Hepato W/eject Fract  05/11/2014   CLINICAL DATA:  Abdominal pain  EXAM: NUCLEAR MEDICINE HEPATOBILIARY IMAGING WITH GALLBLADDER EF  TECHNIQUE: Sequential images of the abdomen were obtained out to 60 minutes following intravenous administration of radiopharmaceutical. After slow intravenous infusion of 2.5 micrograms Cholecystokinin, gallbladder ejection fraction was determined.  RADIOPHARMACEUTICALS:  5.0 mCiTc-55m Choletec  COMPARISON:  Ultrasound, 05/10/2014  FINDINGS: There is homogeneous accumulation of radiotracer by the liver. Prompt excretion was noted into the intra and extrahepatic biliary tree.  Small bowel activity seen early during the study, and gallbladder activity seen later during the exam indicating patency of the common bile duct and cystic duct respectively.  Gallbladder function was then assessed with gallbladder contraction stimulated with a CCK infusion. Gallbladder contraction is within the normal range with an ejection fraction calculated at 44%. At 30 min, normal ejection fraction is greater than 30%.  The patient did not experience symptoms during CCK infusion.  IMPRESSION: Normal hepatobiliary imaging with normal gallbladder function.   Electronically Signed   By: Lajean Manes M.D.   On: 05/11/2014 17:42   Ct Angio Abd/pel W/ And/or W/o  05/12/2014   CLINICAL DATA:  Follow-up ischemic bowel.  EXAM: CTA ABDOMEN AND PELVIS WITHOUT AND WITH CONTRAST  TECHNIQUE: Multidetector CT imaging of the abdomen and pelvis was performed using the standard protocol during bolus administration of intravenous contrast. Multiplanar reconstructed images and MIPs were obtained and reviewed to evaluate the vascular anatomy.  CONTRAST:  166mL OMNIPAQUE IOHEXOL 350 MG/ML SOLN  COMPARISON:  CTA of the abdomen and pelvis performed 05/07/2014  FINDINGS: Right basilar airspace opacification likely reflects atelectasis. A small right pleural effusion is seen.  Previously noted pneumatosis has resolved. There is new inflammation involving the ascending colon, with significant associated mucosal thickening. Intraluminal contents along the ascending colon demonstrate increased attenuation. Would correlate for evidence of rectal blood, though this could simply reflect diluted contrast. Findings raise concern for worsened focal colitis, though underlying changes of ischemia are no longer visible. There is no evidence of perforation at this time.  Mild  soft tissue inflammation tracks along the course of the appendix at the right lower quadrant, and trace free fluid is noted at the lower quadrants. The appendix remains  normal in caliber, without evidence for appendicitis.  The liver is unremarkable in appearance; previously noted portal venous gas has resolved. There is mild nodularity of the splenic contour, without significant mass. There is apparent vicarious contrast excretion into the gallbladder; the gallbladder is otherwise unremarkable. The pancreas and adrenal glands are unremarkable.  A few bilateral renal parapelvic cysts are suggested. Mild perinephric stranding and perinephric fluid are seen bilaterally. There is no evidence of hydronephrosis. No renal or ureteral stones are seen.  The small bowel is unremarkable in appearance. The stomach is within normal limits. No acute vascular abnormalities are seen.  The bladder is mildly distended and grossly unremarkable. The prostate remains borderline normal in size. No inguinal lymphadenopathy is seen.  No acute osseous abnormalities are identified.  Review of the MIP images confirms the above findings.  IMPRESSION: 1. New inflammation involving the ascending colon, with significant associated mucosal thickening. Previously noted pneumatosis has resolved. Intraorbital contents along the ascending colon demonstrate increased attenuation; would correlate for evidence of rectal blood, though this could simply reflect diluted contrast. Findings raise concern for worsened focal colitis, though underlying changes of ischemia are no longer visible. No evidence of perforation at this time. 2. Trace free fluid noted at the lower quadrants, with mild associated soft tissue inflammation. 3. Few bilateral renal parapelvic cysts suggested. 4. Right basilar airspace opacification likely reflects atelectasis. Small right pleural effusion seen.   Electronically Signed   By: Garald Balding M.D.   On: 05/12/2014 01:10      Assessment & Plan   Acute ischemic event to right colon. He is steadily improving, antibiotics are empiric - would treat for about 10 days total  Add daily PPI for  possible GERD Advance diet as tolerated   OOB and ambulate He will need to follow up with GI outpt and have colonoscopy in several weeks  Hopefully can be discharged next 1-2 days   Principal Problem:   Pneumatosis of intestines Active Problems:   Type II or unspecified type diabetes mellitus without mention of complication, uncontrolled   Hyperlipidemia   Diabetic neuropathy   Syncope   Hypotension   Abdominal pain, unspecified site   Unspecified protein-calorie malnutrition   Abdominal pain, other specified site   Ischemic colitis   LOS: 6 days   Ladene Artist MD 05/13/2014, 12:04 PM

## 2014-05-13 NOTE — Progress Notes (Signed)
Subjective: Still with some upper abdominal pain with po intake  Objective: Vital signs in last 24 hours: Temp:  [97.5 F (36.4 C)-98.1 F (36.7 C)] 97.5 F (36.4 C) (05/24 0354) Pulse Rate:  [64-77] 64 (05/24 0354) Resp:  [18] 18 (05/24 0354) BP: (125-138)/(79-89) 125/79 mmHg (05/24 0354) SpO2:  [96 %-100 %] 100 % (05/24 0354) Last BM Date: 05/12/14  Intake/Output from previous day: 05/23 0701 - 05/24 0700 In: 240 [P.O.:240] Out: 2753 [Urine:2750; Stool:3] Intake/Output this shift:    Abdomen soft with minimal right sided tenderness and almost no guarding  Lab Results:   Recent Labs  05/12/14 0310  WBC 7.5  HGB 11.9*  HCT 36.7*  PLT 233   BMET  Recent Labs  05/12/14 0310 05/13/14 0055  NA 139 137  K 3.2* 3.9  CL 100 97  CO2 28 26  GLUCOSE 141* 128*  BUN 8 7  CREATININE 0.95 0.91  CALCIUM 8.5 9.5   PT/INR No results found for this basename: LABPROT, INR,  in the last 72 hours ABG No results found for this basename: PHART, PCO2, PO2, HCO3,  in the last 72 hours  Studies/Results: Nm Hepato W/eject Fract  05/11/2014   CLINICAL DATA:  Abdominal pain  EXAM: NUCLEAR MEDICINE HEPATOBILIARY IMAGING WITH GALLBLADDER EF  TECHNIQUE: Sequential images of the abdomen were obtained out to 60 minutes following intravenous administration of radiopharmaceutical. After slow intravenous infusion of 2.5 micrograms Cholecystokinin, gallbladder ejection fraction was determined.  RADIOPHARMACEUTICALS:  5.0 mCiTc-47m Choletec  COMPARISON:  Ultrasound, 05/10/2014  FINDINGS: There is homogeneous accumulation of radiotracer by the liver. Prompt excretion was noted into the intra and extrahepatic biliary tree. Small bowel activity seen early during the study, and gallbladder activity seen later during the exam indicating patency of the common bile duct and cystic duct respectively.  Gallbladder function was then assessed with gallbladder contraction stimulated with a CCK infusion.  Gallbladder contraction is within the normal range with an ejection fraction calculated at 44%. At 30 min, normal ejection fraction is greater than 30%.  The patient did not experience symptoms during CCK infusion.  IMPRESSION: Normal hepatobiliary imaging with normal gallbladder function.   Electronically Signed   By: Lajean Manes M.D.   On: 05/11/2014 17:42   Ct Angio Abd/pel W/ And/or W/o  05/12/2014   CLINICAL DATA:  Follow-up ischemic bowel.  EXAM: CTA ABDOMEN AND PELVIS WITHOUT AND WITH CONTRAST  TECHNIQUE: Multidetector CT imaging of the abdomen and pelvis was performed using the standard protocol during bolus administration of intravenous contrast. Multiplanar reconstructed images and MIPs were obtained and reviewed to evaluate the vascular anatomy.  CONTRAST:  127mL OMNIPAQUE IOHEXOL 350 MG/ML SOLN  COMPARISON:  CTA of the abdomen and pelvis performed 05/07/2014  FINDINGS: Right basilar airspace opacification likely reflects atelectasis. A small right pleural effusion is seen.  Previously noted pneumatosis has resolved. There is new inflammation involving the ascending colon, with significant associated mucosal thickening. Intraluminal contents along the ascending colon demonstrate increased attenuation. Would correlate for evidence of rectal blood, though this could simply reflect diluted contrast. Findings raise concern for worsened focal colitis, though underlying changes of ischemia are no longer visible. There is no evidence of perforation at this time.  Mild soft tissue inflammation tracks along the course of the appendix at the right lower quadrant, and trace free fluid is noted at the lower quadrants. The appendix remains normal in caliber, without evidence for appendicitis.  The liver is unremarkable in appearance; previously noted  portal venous gas has resolved. There is mild nodularity of the splenic contour, without significant mass. There is apparent vicarious contrast excretion into the  gallbladder; the gallbladder is otherwise unremarkable. The pancreas and adrenal glands are unremarkable.  A few bilateral renal parapelvic cysts are suggested. Mild perinephric stranding and perinephric fluid are seen bilaterally. There is no evidence of hydronephrosis. No renal or ureteral stones are seen.  The small bowel is unremarkable in appearance. The stomach is within normal limits. No acute vascular abnormalities are seen.  The bladder is mildly distended and grossly unremarkable. The prostate remains borderline normal in size. No inguinal lymphadenopathy is seen.  No acute osseous abnormalities are identified.  Review of the MIP images confirms the above findings.  IMPRESSION: 1. New inflammation involving the ascending colon, with significant associated mucosal thickening. Previously noted pneumatosis has resolved. Intraorbital contents along the ascending colon demonstrate increased attenuation; would correlate for evidence of rectal blood, though this could simply reflect diluted contrast. Findings raise concern for worsened focal colitis, though underlying changes of ischemia are no longer visible. No evidence of perforation at this time. 2. Trace free fluid noted at the lower quadrants, with mild associated soft tissue inflammation. 3. Few bilateral renal parapelvic cysts suggested. 4. Right basilar airspace opacification likely reflects atelectasis. Small right pleural effusion seen.   Electronically Signed   By: Garald Balding M.D.   On: 05/12/2014 01:10    Anti-infectives: Anti-infectives   Start     Dose/Rate Route Frequency Ordered Stop   05/08/14 1030  ciprofloxacin (CIPRO) IVPB 400 mg     400 mg 200 mL/hr over 60 Minutes Intravenous Every 12 hours 05/08/14 0924     05/07/14 2130  metroNIDAZOLE (FLAGYL) IVPB 500 mg     500 mg 100 mL/hr over 60 Minutes Intravenous Every 8 hours 05/07/14 2129     05/07/14 1730  piperacillin-tazobactam (ZOSYN) IVPB 3.375 g     3.375 g 100 mL/hr over  30 Minutes Intravenous  Once 05/07/14 1721 05/07/14 1952      Assessment/Plan:  Colitis right colon  Continue conservative management Keep on full liquids  LOS: 6 days    Harl Bowie 05/13/2014

## 2014-05-13 NOTE — Progress Notes (Signed)
Subjective: Patient said last night after eating tomato soup he had epigastric pain.  Pain in epigastric/RUQ is 6/10 intermittent worse after eating tomato soup last night. Slight nausea but not as bad, denies vomiting.  He had a hard time sleeping last due to ab pain but did not ask for medication.   Objective: Vital signs in last 24 hours: Filed Vitals:   05/12/14 0620 05/12/14 1319 05/12/14 2054 05/13/14 0354  BP: 127/82 125/82 138/89 125/79  Pulse: 60 73 77 64  Temp: 98.4 F (36.9 C) 98 F (36.7 C) 98.1 F (36.7 C) 97.5 F (36.4 C)  TempSrc: Oral Oral Oral Oral  Resp: 18 18 18 18   Height:      Weight:      SpO2: 97% 96% 100% 100%   Weight change:   Intake/Output Summary (Last 24 hours) at 05/13/14 0926 Last data filed at 05/13/14 0730  Gross per 24 hour  Intake    480 ml  Output   2700 ml  Net  -2220 ml   Vitals reviewed. General: resting in bed, NAD HEENT: [place]/at, no scleral icterus Cardiac: RRR, no rubs, murmurs or gallops Pulm: clear to auscultation bilaterally, no wheezes, rales, or rhonchi Abd: soft, nontender with palpation epigastric and RUQ, nondistended, BS present Ext: warm and well perfused, no pedal edema Neuro: alert and oriented X3, cranial nerves II-XII grossly intact,moving all 4 extremities   Lab Results: Basic Metabolic Panel:  Recent Labs Lab 05/12/14 0310 05/13/14 0055  NA 139 137  K 3.2* 3.9  CL 100 97  CO2 28 26  GLUCOSE 141* 128*  BUN 8 7  CREATININE 0.95 0.91  CALCIUM 8.5 9.5  MG 1.0* 1.6  PHOS  --  3.5   Liver Function Tests:  Recent Labs Lab 05/07/14 1601 05/10/14 0830  AST 15 21  ALT 14 12  ALKPHOS 39 53  BILITOT 0.5 0.9  PROT 6.6 6.2  ALBUMIN 4.1 2.8*    Recent Labs Lab 05/07/14 1601 05/08/14 1714  LIPASE 43  --   AMYLASE  --  34   CBC:  Recent Labs Lab 05/07/14 1601  05/10/14 0325 05/12/14 0310  WBC 12.0*  < > 9.4 7.5  NEUTROABS 9.3*  --  6.4  --   HGB 12.4*  < > 11.4* 11.9*  HCT 37.0*  < >  35.3* 36.7*  MCV 88.7  < > 90.1 88.4  PLT 198  < > 188 233  < > = values in this interval not displayed. Cardiac Enzymes:  Recent Labs Lab 05/07/14 2217 05/08/14 0440 05/08/14 1055  TROPONINI <0.30 <0.30 <0.30   BNP:  Recent Labs Lab 05/07/14 1612  PROBNP 37.8   CBG:  Recent Labs Lab 05/12/14 0614 05/12/14 1122 05/12/14 1649 05/12/14 2108 05/13/14 0012 05/13/14 0611  GLUCAP 136* 161* 123* 133* 123* 130*   Hemoglobin A1C:  Recent Labs Lab 05/08/14 0440  HGBA1C 6.5*   Coagulation:  Recent Labs Lab 05/07/14 1601  LABPROT 13.0  INR 1.00   Urinalysis:  Recent Labs Lab 05/07/14 2217  COLORURINE YELLOW  LABSPEC 1.037*  PHURINE 5.0  GLUCOSEU NEGATIVE  HGBUR NEGATIVE  BILIRUBINUR NEGATIVE  KETONESUR NEGATIVE  PROTEINUR NEGATIVE  UROBILINOGEN 0.2  NITRITE NEGATIVE  LEUKOCYTESUR NEGATIVE   Misc. Labs: None   Micro Results: Recent Results (from the past 240 hour(s))  CULTURE, BLOOD (ROUTINE X 2)     Status: None   Collection Time    05/07/14  7:26 PM  Result Value Ref Range Status   Specimen Description BLOOD HAND RIGHT   Final   Special Requests BOTTLES DRAWN AEROBIC ONLY 2CC   Final   Culture  Setup Time     Final   Value: 05/08/2014 00:40     Performed at [facility]   Culture     Final   Value:        BLOOD CULTURE RECEIVED NO GROWTH TO DATE CULTURE WILL BE HELD FOR 5 DAYS BEFORE ISSUING A FINAL NEGATIVE REPORT     Performed at [facility]   Report Status PENDING   Incomplete  CULTURE, BLOOD (ROUTINE X 2)     Status: None   Collection Time    05/07/14  7:30 PM      Result Value Ref Range Status   Specimen Description BLOOD ARM LEFT   Final   Special Requests BOTTLES DRAWN AEROBIC AND ANAEROBIC 6CC   Final   Culture  Setup Time     Final   Value: 05/08/2014 00:41     Performed at [facility]   Culture     Final   Value:        BLOOD CULTURE RECEIVED NO GROWTH TO DATE CULTURE WILL BE HELD FOR 5 DAYS  BEFORE ISSUING A FINAL NEGATIVE REPORT     Performed at [facility]   Report Status PENDING   Incomplete  MRSA PCR SCREENING     Status: None   Collection Time    05/08/14 12:10 AM      Result Value Ref Range Status   MRSA by PCR NEGATIVE  NEGATIVE Final   Comment:            The GeneXpert MRSA Assay (FDA     approved for NASAL specimens     only), is one component of a     comprehensive MRSA colonization     surveillance program. It is not     intended to diagnose MRSA     infection nor to guide or     monitor treatment for     MRSA infections.   Studies/Results: Nm Hepato W/eject Fract  05/11/2014   CLINICAL DATA:  Abdominal pain  EXAM: NUCLEAR MEDICINE HEPATOBILIARY IMAGING WITH GALLBLADDER EF  TECHNIQUE: Sequential images of the abdomen were obtained out to 60 minutes following intravenous administration of radiopharmaceutical. After slow intravenous infusion of 2.5 micrograms Cholecystokinin, gallbladder ejection fraction was determined.  RADIOPHARMACEUTICALS:  5.0 mCiTc-60m Choletec  COMPARISON:  Ultrasound, 05/10/2014  FINDINGS: There is homogeneous accumulation of radiotracer by the liver. Prompt excretion was noted into the intra and extrahepatic biliary tree. Small bowel activity seen early during the study, and gallbladder activity seen later during the exam indicating patency of the common bile duct and cystic duct respectively.  Gallbladder function was then assessed with gallbladder contraction stimulated with a CCK infusion. Gallbladder contraction is within the normal range with an ejection fraction calculated at 44%. At 30 min, normal ejection fraction is greater than 30%.  The patient did not experience symptoms during CCK infusion.  IMPRESSION: Normal hepatobiliary imaging with normal gallbladder function.   Electronically Signed   By: Lajean Manes M.D.   On: 05/11/2014 17:42   Ct Angio Abd/pel W/ And/or W/o  05/12/2014   CLINICAL DATA:  Follow-up ischemic bowel.   EXAM: CTA ABDOMEN AND PELVIS WITHOUT AND WITH CONTRAST  TECHNIQUE: Multidetector CT imaging of the abdomen and pelvis was performed using the standard protocol  during bolus administration of intravenous contrast. Multiplanar reconstructed images and MIPs were obtained and reviewed to evaluate the vascular anatomy.  CONTRAST:  147mL OMNIPAQUE IOHEXOL 350 MG/ML SOLN  COMPARISON:  CTA of the abdomen and pelvis performed 05/07/2014  FINDINGS: Right basilar airspace opacification likely reflects atelectasis. A small right pleural effusion is seen.  Previously noted pneumatosis has resolved. There is new inflammation involving the ascending colon, with significant associated mucosal thickening. Intraluminal contents along the ascending colon demonstrate increased attenuation. Would correlate for evidence of rectal blood, though this could simply reflect diluted contrast. Findings raise concern for worsened focal colitis, though underlying changes of ischemia are no longer visible. There is no evidence of perforation at this time.  Mild soft tissue inflammation tracks along the course of the appendix at the right lower quadrant, and trace free fluid is noted at the lower quadrants. The appendix remains normal in caliber, without evidence for appendicitis.  The liver is unremarkable in appearance; previously noted portal venous gas has resolved. There is mild nodularity of the splenic contour, without significant mass. There is apparent vicarious contrast excretion into the gallbladder; the gallbladder is otherwise unremarkable. The pancreas and adrenal glands are unremarkable.  A few bilateral renal parapelvic cysts are suggested. Mild perinephric stranding and perinephric fluid are seen bilaterally. There is no evidence of hydronephrosis. No renal or ureteral stones are seen.  The small bowel is unremarkable in appearance. The stomach is within normal limits. No acute vascular abnormalities are seen.  The bladder is  mildly distended and grossly unremarkable. The prostate remains borderline normal in size. No inguinal lymphadenopathy is seen.  No acute osseous abnormalities are identified.  Review of the MIP images confirms the above findings.  IMPRESSION: 1. New inflammation involving the ascending colon, with significant associated mucosal thickening. Previously noted pneumatosis has resolved. Intraorbital contents along the ascending colon demonstrate increased attenuation; would correlate for evidence of rectal blood, though this could simply reflect diluted contrast. Findings raise concern for worsened focal colitis, though underlying changes of ischemia are no longer visible. No evidence of perforation at this time. 2. Trace free fluid noted at the lower quadrants, with mild associated soft tissue inflammation. 3. Few bilateral renal parapelvic cysts suggested. 4. Right basilar airspace opacification likely reflects atelectasis. Small right pleural effusion seen.   Electronically Signed   By: Garald Balding M.D.   On: 05/12/2014 01:10   Medications:  Scheduled Meds: . ciprofloxacin  400 mg Intravenous Q12H  . gabapentin  600 mg Oral TID  . insulin aspart  0-9 Units Subcutaneous 4 times per day  . magnesium sulfate 1 - 4 g bolus IVPB  2 g Intravenous Once  . metronidazole  500 mg Intravenous Q8H  . pantoprazole (PROTONIX) IV  40 mg Intravenous Q24H   Continuous Infusions:  PRN Meds:.HYDROmorphone (DILAUDID) injection, ondansetron (ZOFRAN) IV, ondansetron, promethazine Assessment/Plan: #Pneumatosis in cecum and right ischemic colitis  -likely 2/2 hypotension BP improved. improving -GI, Surg following, needs outpatient colonoscopy  -continue abx x 2 weeks total (Cipro/Flagyl), prn Diluadid for pain, prn Phenerghan or Zofran for nausea -tolerating full liq diet  -will add Protonix iv qd as pt has history of indigestion and is having epigastric pain with food which could be related to indigestion GERD -needs  H/H PT  #Type II or unspecified type diabetes mellitus with neuropathy (HA1C 6.5) -Continue Neurontin -SSI-S -monitor cbgs   #Syncope and hypotension -syncope was likely 2/2 hypotension. Echo nl except for grade 1 diastolic dysfunction   -  BP has been normotensive. Will d/c home BP meds at discharge  #Unspecified protein-calorie malnutrition  #F/E/N -NSL -BMET in am, Mag 1.6 will give 2 grams  -full liq diet avoid acidic foods   #DVT px  -scds    Dispo: Disposition is deferred at this time, awaiting improvement of current medical problems.  Anticipated discharge in approximately 2-3 day(s).   The patient does have a current PCP NP [facility] and does not need an [facility] hospital follow-up appointment after discharge.  The patient does not have transportation limitations that hinder transportation to clinic appointments.  .Services Needed at time of discharge: Y = Yes, Blank = No PT: H/H   OT:   RN:   Equipment: Rolling walker   Other:     LOS: 6 days   Cresenciano Genre, MD [phone] 05/13/2014, 9:26 AM

## 2014-05-14 DIAGNOSIS — K648 Other hemorrhoids: Secondary | ICD-10-CM | POA: Diagnosis not present

## 2014-05-14 DIAGNOSIS — K921 Melena: Secondary | ICD-10-CM

## 2014-05-14 DIAGNOSIS — R197 Diarrhea, unspecified: Secondary | ICD-10-CM

## 2014-05-14 LAB — BASIC METABOLIC PANEL
BUN: 7 mg/dL (ref 6–23)
CALCIUM: 9.7 mg/dL (ref 8.4–10.5)
CO2: 30 meq/L (ref 19–32)
CREATININE: 1.03 mg/dL (ref 0.50–1.35)
Chloride: 100 mEq/L (ref 96–112)
GFR calc Af Amer: >90 mL/min (ref >90)
GFR, EST NON AFRICAN AMERICAN: 81 mL/min — AB (ref >90)
GLUCOSE: 123 mg/dL — AB (ref 70–99)
Potassium: 4.2 mEq/L (ref 3.7–5.3)
Sodium: 138 mEq/L (ref 137–147)

## 2014-05-14 LAB — GLUCOSE, CAPILLARY
GLUCOSE-CAPILLARY: 205 mg/dL — AB (ref 70–99)
Glucose-Capillary: 119 mg/dL — ABNORMAL HIGH (ref 70–99)
Glucose-Capillary: 120 mg/dL — ABNORMAL HIGH (ref 70–99)
Glucose-Capillary: 135 mg/dL — ABNORMAL HIGH (ref 70–99)
Glucose-Capillary: 138 mg/dL — ABNORMAL HIGH (ref 70–99)
Glucose-Capillary: 157 mg/dL — ABNORMAL HIGH (ref 70–99)
Glucose-Capillary: 162 mg/dL — ABNORMAL HIGH (ref 70–99)

## 2014-05-14 LAB — CULTURE, BLOOD (ROUTINE X 2)
Culture: NO GROWTH
Culture: NO GROWTH

## 2014-05-14 LAB — CBC
HCT: 37 % — ABNORMAL LOW (ref 39.0–52.0)
Hemoglobin: 12.1 g/dL — ABNORMAL LOW (ref 13.0–17.0)
MCH: 28.9 pg (ref 26.0–34.0)
MCHC: 32.7 g/dL (ref 30.0–36.0)
MCV: 88.5 fL (ref 78.0–100.0)
PLATELETS: 268 10*3/uL (ref 150–400)
RBC: 4.18 MIL/uL — ABNORMAL LOW (ref 4.22–5.81)
RDW: 12.7 % (ref 11.5–15.5)
WBC: 6.8 10*3/uL (ref 4.0–10.5)

## 2014-05-14 LAB — CLOSTRIDIUM DIFFICILE BY PCR: Toxigenic C. Difficile by PCR: NEGATIVE

## 2014-05-14 LAB — MAGNESIUM: MAGNESIUM: 1.4 mg/dL — AB (ref 1.5–2.5)

## 2014-05-14 LAB — OCCULT BLOOD X 1 CARD TO LAB, STOOL: Fecal Occult Bld: POSITIVE — AB

## 2014-05-14 MED ORDER — MAGNESIUM SULFATE 40 MG/ML IJ SOLN
2.0000 g | Freq: Once | INTRAMUSCULAR | Status: DC
  Filled 2014-05-14: qty 50

## 2014-05-14 MED ORDER — MAGNESIUM SULFATE 40 MG/ML IJ SOLN
2.0000 g | Freq: Once | INTRAMUSCULAR | Status: AC
  Administered 2014-05-14: 2 g via INTRAVENOUS
  Filled 2014-05-14: qty 50

## 2014-05-14 NOTE — Progress Notes (Addendum)
Subjective:   VSS.  Pt has been admitted for 7 days with RUQ abdominal pain still about the same, states not improving.  Surgery and GI following.  Still on abx per surgery (day 7).  HIDA scan neg.  lactic acid wnl.  CT shows colitis with resolution of pneumotosis.  No FH of colitis.    Had a BM this AM associated with bright red blood in the toilet bowl.  States that stools were dark.  No h/o hemorrhoids.  Hgb today 12.1.  No tenesmus.  Was able to eat some grits, orange juice, and milk this AM but had some mild nausea without vomiting.  Complains of 8/10 pain but abdominal exam is without tenderness, rebound, or guarding.    Objective:   Vital signs in last 24 hours: Filed Vitals:   05/13/14 0354 05/13/14 1303 05/13/14 2036 05/14/14 0539  BP: 125/79 130/86 112/73 127/78  Pulse: 64 70 65 64  Temp: 97.5 F (36.4 C) 98.4 F (36.9 C) 97.4 F (36.3 C) 98 F (36.7 C)  TempSrc: Oral Oral Oral Oral  Resp: 18 18 18 18   Height:      Weight:      SpO2: 100% 94% 99% 100%    Weight: Filed Weights   05/07/14 1700 05/08/14 0020  Weight: 278 lb 10.6 oz (126.4 kg) 275 lb 12.7 oz (125.1 kg)    Ins/Outs:  Intake/Output Summary (Last 24 hours) at 05/14/14 1232 Last data filed at 05/14/14 0900  Gross per 24 hour  Intake    720 ml  Output   1250 ml  Net   -530 ml    Physical Exam: Constitutional: Vital signs reviewed.  Patient is lying in bed in no acute distress and cooperative with exam.   HEENT: [place]/AT; PERRL, EOMI, conjunctivae normal, no scleral icterus  Cardiovascular: RRR, no MRG Pulmonary/Chest: normal respiratory effort, no accessory muscle use, CTAB, no wheezes, rales, or rhonchi Abdominal: Obese. Soft. +bs, non-tender, non-distended; no guarding or rebound Neurological: A&O x3, CN II-XII grossly intact; non-focal exam Extremities: 2+DP b/l, no C/C/E  Skin: Warm, dry and intact. No rash  Lab Results:  BMP:  Recent Labs Lab 05/12/14 0310 05/13/14 0055 05/14/14 0400   NA 139 137 138  K 3.2* 3.9 4.2  CL 100 97 100  CO2 28 26 30   GLUCOSE 141* 128* 123*  BUN 8 7 7   CREATININE 0.95 0.91 1.03  CALCIUM 8.5 9.5 9.7  MG 1.0* 1.6 1.4*  PHOS  --  3.5  --    Anion Gap:  8  CBC:  Recent Labs Lab 05/07/14 1601  05/10/14 0325 05/12/14 0310 05/14/14 0908  WBC 12.0*  < > 9.4 7.5 6.8  NEUTROABS 9.3*  --  6.4  --   --   HGB 12.4*  < > 11.4* 11.9* 12.1*  HCT 37.0*  < > 35.3* 36.7* 37.0*  MCV 88.7  < > 90.1 88.4 88.5  PLT 198  < > 188 233 268  < > = values in this interval not displayed.  Coagulation:  Recent Labs Lab 05/07/14 1601  LABPROT 13.0  INR 1.00    CBG:            Recent Labs Lab 05/13/14 1053 05/13/14 1623 05/13/14 2059 05/13/14 2335 05/14/14 0534 05/14/14 1127  GLUCAP 223* 91 119* 138* 120* 205*           HA1C:       Recent Labs Lab 05/08/14 0440  HGBA1C 6.5*  LFTs:  Recent Labs Lab 05/07/14 1601 05/10/14 0830  AST 15 21  ALT 14 12  ALKPHOS 39 53  BILITOT 0.5 0.9  PROT 6.6 6.2  ALBUMIN 4.1 2.8*    Pancreatic Enzymes:  Recent Labs Lab 05/07/14 1601 05/08/14 1714  LIPASE 43  --   AMYLASE  --  34    Lactic Acid/Procalcitonin:  Recent Labs Lab 05/09/14 0238 05/10/14 0755 05/12/14 0310  LATICACIDVEN 0.7 1.1 0.7    Ammonia: No results found for this basename: AMMONIA,  in the last 168 hours  Cardiac Enzymes:  Recent Labs Lab 05/07/14 2217 05/08/14 0440 05/08/14 1055  TROPONINI <0.30 <0.30 <0.30    EKG: EKG Interpretation  Date/Time:  Monday May 07 2014 15:59:33 EDT Ventricular Rate:  82 PR Interval:  177 QRS Duration: 101 QT Interval:  367 QTC Calculation: 429 R Axis:   61 Text Interpretation:  Sinus rhythm RSR' in V1 or V2, right VCD or RVH Abnormal inferior Q waves Borderline ST elevation, inferior leads Confirmed by ZAVITZ  MD, JOSHUA ([id number]) on 05/07/2014 4:12:03 PM   BNP:  Recent Labs Lab 05/07/14 1612  PROBNP 37.8    D-Dimer: No results found for this basename:  DDIMER,  in the last 168 hours  Urinalysis:  Recent Labs Lab 05/07/14 2217  [place] 1.037*  PHURINE 5.0  GLUCOSEU NEGATIVE  HGBUR NEGATIVE  BILIRUBINUR NEGATIVE  KETONESUR NEGATIVE  PROTEINUR NEGATIVE  UROBILINOGEN 0.2  NITRITE NEGATIVE  LEUKOCYTESUR NEGATIVE    Micro Results: Recent Results (from the past 240 hour(s))  CULTURE, BLOOD (ROUTINE X 2)     Status: None   Collection Time    05/07/14  7:26 PM      Result Value Ref Range Status   Specimen Description BLOOD HAND RIGHT   Final   Special Requests BOTTLES DRAWN AEROBIC ONLY 2CC   Final   Culture  Setup Time     Final   Value: 05/08/2014 00:40     Performed at [facility]   Culture     Final   Value: NO GROWTH 5 DAYS     Performed at [facility]   Report Status 05/14/2014 FINAL   Final  CULTURE, BLOOD (ROUTINE X 2)     Status: None   Collection Time    05/07/14  7:30 PM      Result Value Ref Range Status   Specimen Description BLOOD ARM LEFT   Final   Special Requests BOTTLES DRAWN AEROBIC AND ANAEROBIC 6CC   Final   Culture  Setup Time     Final   Value: 05/08/2014 00:41     Performed at [facility]   Culture     Final   Value: NO GROWTH 5 DAYS     Performed at [facility]   Report Status 05/14/2014 FINAL   Final  MRSA PCR SCREENING     Status: None   Collection Time    05/08/14 12:10 AM      Result Value Ref Range Status   MRSA by PCR NEGATIVE  NEGATIVE Final   Comment:            The GeneXpert MRSA Assay (FDA     approved for NASAL specimens     only), is one component of a     comprehensive MRSA colonization     surveillance program. It is not     intended to diagnose MRSA     infection  nor to guide or     monitor treatment for     MRSA infections.    Blood Culture:    Component Value Date/Time   SDES BLOOD ARM LEFT 05/07/2014 1930    Studies/Results: No results found.  Medications:  Scheduled Meds: . ciprofloxacin  400 mg  Intravenous Q12H  . gabapentin  600 mg Oral TID  . insulin aspart  0-9 Units Subcutaneous 4 times per day  . magnesium sulfate 1 - 4 g bolus IVPB  2 g Intravenous Once  . metronidazole  500 mg Intravenous Q8H  . pantoprazole (PROTONIX) IV  40 mg Intravenous Q24H   Continuous Infusions:   PRN Meds: HYDROmorphone (DILAUDID) injection, ondansetron (ZOFRAN) IV, ondansetron, promethazine  Antibiotics: Antibiotics Given (last 72 hours)   Date/Time Action Medication Dose Rate   05/11/14 1746 Given   ciprofloxacin (CIPRO) IVPB 400 mg 400 mg 200 mL/hr   05/11/14 2318 Given   ciprofloxacin (CIPRO) IVPB 400 mg 400 mg 200 mL/hr   05/12/14 1025 Given   ciprofloxacin (CIPRO) IVPB 400 mg 400 mg 200 mL/hr   05/12/14 2202 Given   ciprofloxacin (CIPRO) IVPB 400 mg 400 mg 200 mL/hr   05/13/14 1003 Given   ciprofloxacin (CIPRO) IVPB 400 mg 400 mg 200 mL/hr   05/13/14 2156 Given   ciprofloxacin (CIPRO) IVPB 400 mg 400 mg 200 mL/hr   05/14/14 1042 Given   ciprofloxacin (CIPRO) IVPB 400 mg 400 mg 200 mL/hr      Day of Hospitalization: 7  Consults: Treatment Team:  Ladene Artist, MDSurgery  Assessment/Plan:   Principal Problem:   Pneumatosis of intestines Active Problems:   Type II or unspecified type diabetes mellitus without mention of complication, uncontrolled   Hyperlipidemia   Diabetic neuropathy   Syncope   Hypotension   Abdominal pain, unspecified site   Unspecified protein-calorie malnutrition   Abdominal pain, other specified site   Ischemic colitis   Diarrhea   Blood in stool  Diarrhea and nausea with BRBPR Pt notes episode of diarrhea with BRB in the toilet.  No h/o hemorrhoids.  Hgb stable.  Reports that dilaudid is also giving him some nausea shortly after administration but is helping with his pain? Likely medication induced nausea including neurontin (pt reports his needs this d/t neuropathy), abx, dilaudid.  Would try to discontinue all unecessary meds that may be  contributing.    -cbc -check c.diff given recent abx use -FOBT  RUQ abdominal pain w/ associated colitis on CT Pt reports pain has not improved and associated with some nausea at times.  Non-tender on exam.  No vomiting.  Had BM this AM associated with BRBPR.  Pt with h/o GERD.  Pt admitted w/ epigastric pain followed by episode of syncope. Surgery consulted and does not recommend surgical intervention at this time given VSS without peritonitis.  Lactic acid wnl therefore ischemic bowel unlikely-no peritoneal signs.  Pneumatosis has resolved.  Amylase, lactic acid, LFTs wnl.  RUQ U/S and HIDA scan non-revealing.  Repeat CT revealed resolution of pneumatosis with worsened colitis.  GI advanced diet with outpatient colonoscopy in several weeks to r/o other causes of colitis.  -could change abx to po? -continue cipro and flagyl (had 1 dose of zosyn) for total of 10-14 days -BCx pending (NGTD) -I/O's  -protonix 40mg  IV -phenergan IV q6h prn -dilaudid 0.5-1mg  IV prn -advance diet as tolerated -surgery, GI following appreciate recs  Syncope VSS.  No further episodes.  Etiology most likely orthostatic hypotension.  Pt works at [employer] and reports that it was very hot and did not feel well.  He reports BP sometimes runs low because he feels that he is taking too much BP medication which is prescribed by a NP at his employer.  He needs a PCP. Trops x 3 neg.  Echo revealed LVEF of 55-60% with grade 1 diastolic dysfunction.  -PT consulted   AKI Resolved.  Pt w/ Cr of 2.06 on admission, baseline unknown.  Cr trended down after IVF.   -d/c home prinzide   h/o HTN Pt on lisinopril-HCTZ 20-25 at home with hypotensive on admission.  Would not give BP meds at this point. -d/c home meds  Dyslipidemia On Pravachol 40 mg qhs at home.  -hold Pravastatin for now  DMII w/ neuropathy Lab Results  Component Value Date   HGBA1C 6.5* 05/08/2014  Takes metformin 1000 mg bid, liraglutide, glipizide 2.5 mg  qd at home.  Sees Dr. Dwyane Dee (endocrinologist).  -Hold home meds  -SSI-S -cbgs  -continue neurontin   Deconditioning PT consulted recommending HH PT and rolling walker.    VTE PPx SCD  Disposition Disposition is deferred, awaiting improvement of current medical problems.  Anticipated discharge in approximately 1-2 day(s).     LOS: 7 days   Jones Bales, MD PGY-1, Internal Medicine Teaching Service [phone] (7AM-5PM Mon-Fri) 05/14/2014, 12:32 PM

## 2014-05-14 NOTE — Progress Notes (Signed)
Physical Therapy Treatment Patient Details Name: Jack Wallace MRN: [id number] DOB: 04-04-1961 Today's Date: 05/14/2014    History of Present Illness Jack Wallace is a 53 y.o. male w/ PMHx of HTN, HLD, DM type II, presented to the ED after an episode of syncope and abdominal pain. He was admitted with dx of pneumatosis of intestines.    PT Comments    Patient improving with mobility and gait.  Able to complete stairs with min guard assist.  Follow Up Recommendations  Home health PT;Supervision - Intermittent     Equipment Recommendations  Rolling walker with 5" wheels    Recommendations for Other Services       Precautions / Restrictions Precautions Precautions: Fall Restrictions Weight Bearing Restrictions: No    Mobility  Bed Mobility Overal bed mobility: Modified Independent             General bed mobility comments: with use of bedrail, HOB flat  Transfers Overall transfer level: Modified independent Equipment used: Rolling walker (2 wheeled);None Transfers: Sit to/from Stand Sit to Stand: Modified independent (Device/Increase time)         General transfer comment: Patient requires no physical assist with sit <> stand from bed and toilet.  Good balance in standing.  Ambulation/Gait Ambulation/Gait assistance: Supervision Ambulation Distance (Feet): 250 Feet Assistive device: None Gait Pattern/deviations: Step-through pattern;Decreased stride length;Drifts right/left;Wide base of support Gait velocity: decreased Gait velocity interpretation: Below normal speed for age/gender General Gait Details: Worked on gait/balance without AD.  Patient slightly unsteady without RW.  Worked on advanced activities as head turns, stop/start, change direction.  Encouraged patient to use RW when ambulating in hallway/room with nursing.   Stairs Stairs: Yes Stairs assistance: Min guard Stair Management: Two rails;Alternating pattern;Step to  pattern;Forwards Number of Stairs: 10 General stair comments: Instructed patient on safe negotiation of stairs using rails.  Instructed in step-to pattern for safety.  Patient preferred alternating steps going up stairs, and step-to pattern coming down stairs.    Wheelchair Mobility    Modified Rankin (Stroke Patients Only)       Balance Overall balance assessment: Needs assistance                           High level balance activites: Direction changes;Turns;Sudden stops;Head turns High Level Balance Comments: Slightly unsteady with high level balance activities.    Cognition Arousal/Alertness: Awake/alert Behavior During Therapy: WFL for tasks assessed/performed Overall Cognitive Status: Within Functional Limits for tasks assessed                      Exercises      General Comments        Pertinent Vitals/Pain     Home Living                      Prior Function            PT Goals (current goals can now be found in the care plan section) Progress towards PT goals: Progressing toward goals    Frequency  Min 3X/week    PT Plan Current plan remains appropriate    Co-evaluation             End of Session Equipment Utilized During Treatment: Gait belt Activity Tolerance: Patient tolerated treatment well Patient left: in bed;with call bell/phone within reach (sitting EOB)     Time: 1430-1453 PT Time Calculation (min): 23  min  Charges:  $Gait Training: 23-37 mins                    G Codes:      Despina Pole 31-May-2014, 3:24 PM Carita Pian. Sanjuana Kava, [place] Pager [phone]

## 2014-05-14 NOTE — Progress Notes (Signed)
  Subjective: Pt with some nausea and burning epigastric pain this AM without PO.  Objective: Vital signs in last 24 hours: Temp:  [97.4 F (36.3 C)-98.4 F (36.9 C)] 98 F (36.7 C) (05/25 0539) Pulse Rate:  [64-70] 64 (05/25 0539) Resp:  [18] 18 (05/25 0539) BP: (112-130)/(73-86) 127/78 mmHg (05/25 0539) SpO2:  [94 %-100 %] 100 % (05/25 0539) Last BM Date: 05/13/14  Intake/Output from previous day: 05/24 0701 - 05/25 0700 In: 840 [P.O.:840] Out: 2150 [Urine:2150] Intake/Output this shift:    General appearance: alert and cooperative Cardio: regular rate and rhythm, S1, S2 normal, no murmur, click, rub or gallop GI: soft, min ttp in R side of abd  Lab Results:   Recent Labs  05/12/14 0310  WBC 7.5  HGB 11.9*  HCT 36.7*  PLT 233   BMET  Recent Labs  05/13/14 0055 05/14/14 0400  NA 137 138  K 3.9 4.2  CL 97 100  CO2 26 30  GLUCOSE 128* 123*  BUN 7 7  CREATININE 0.91 1.03  CALCIUM 9.5 9.7    Anti-infectives: Anti-infectives   Start     Dose/Rate Route Frequency Ordered Stop   05/08/14 1030  ciprofloxacin (CIPRO) IVPB 400 mg     400 mg 200 mL/hr over 60 Minutes Intravenous Every 12 hours 05/08/14 0924     05/07/14 2130  metroNIDAZOLE (FLAGYL) IVPB 500 mg     500 mg 100 mL/hr over 60 Minutes Intravenous Every 8 hours 05/07/14 2129     05/07/14 1730  piperacillin-tazobactam (ZOSYN) IVPB 3.375 g     3.375 g 100 mL/hr over 30 Minutes Intravenous  Once 05/07/14 1721 05/07/14 1952      Assessment/Plan: Hx HTN, DM2, HLD, morbid obesity  Syncopal episode  Hypotension  Acute kidney injury  R ischemic colitis  PT con't with BMs, states some of it was bloody -will order CBC C/o epigastric pain - on PPI Con't PO    LOS: 7 days    Ralene Ok 05/14/2014

## 2014-05-14 NOTE — Progress Notes (Signed)
    Progress Note   Subjective   Some RUQ pain following meals. Has loose and urgent stools for 2 days. He had one episode of BRBPR yesterday.    Objective  Vital signs in last 24 hours: Temp:  [97.4 F (36.3 C)-98.4 F (36.9 C)] 98 F (36.7 C) (05/25 0539) Pulse Rate:  [64-70] 64 (05/25 0539) Resp:  [18] 18 (05/25 0539) BP: (112-130)/(73-86) 127/78 mmHg (05/25 0539) SpO2:  [94 %-100 %] 100 % (05/25 0539) Last BM Date: 05/13/14  General:   Alert, well-developed, white male in NAD Heart:  Regular rate and rhythm; no murmurs Abdomen:  Soft, nontender and nondistended. Normal bowel sounds, without guarding, and without rebound.   Extremities:  Without edema. Neurologic:  Alert and  oriented x4;  grossly normal neurologically. Psych:  Alert and cooperative. Normal mood and affect.  Intake/Output from previous day: 05/24 0701 - 05/25 0700 In: 840 [P.O.:840] Out: 2150 [Urine:2150] Intake/Output this shift: Total I/O In: 360 [P.O.:360] Out: 450 [Urine:450]  Lab Results:  Recent Labs  05/12/14 0310 05/14/14 0908  WBC 7.5 6.8  HGB 11.9* 12.1*  HCT 36.7* 37.0*  PLT 233 268   BMET  Recent Labs  05/12/14 0310 05/13/14 0055 05/14/14 0400  NA 139 137 138  K 3.2* 3.9 4.2  CL 100 97 100  CO2 28 26 30   GLUCOSE 141* 128* 123*  BUN 8 7 7   CREATININE 0.95 0.91 1.03  CALCIUM 8.5 9.5 9.7      Assessment & Plan   Acute ischemic event to right colon. His pain is steadily improving. Diarrhea might be related to antibiotics which are empiric. Limited rectal bleeding likely from colitis. Check C diff.  Continue PPI daily for possible GERD  Advance diet as tolerated  Encouraged OOB and ambulation  He will need colonoscopy in several weeks if he continues to improve. May need to schedule in hospital if he fails to rapidly improve.   Principal Problem:   Pneumatosis of intestines Active Problems:   Type II or unspecified type diabetes mellitus without mention of  complication, uncontrolled   Hyperlipidemia   Diabetic neuropathy   Syncope   Hypotension   Abdominal pain, unspecified site   Unspecified protein-calorie malnutrition   Abdominal pain, other specified site   Ischemic colitis    LOS: 7 days   Ladene Artist MD 05/14/2014, 11:42 AM

## 2014-05-15 LAB — MAGNESIUM: Magnesium: 1.2 mg/dL — ABNORMAL LOW (ref 1.5–2.5)

## 2014-05-15 LAB — CBC
HCT: 38.5 % — ABNORMAL LOW (ref 39.0–52.0)
Hemoglobin: 12.8 g/dL — ABNORMAL LOW (ref 13.0–17.0)
MCH: 29.3 pg (ref 26.0–34.0)
MCHC: 33.2 g/dL (ref 30.0–36.0)
MCV: 88.1 fL (ref 78.0–100.0)
Platelets: 273 10*3/uL (ref 150–400)
RBC: 4.37 MIL/uL (ref 4.22–5.81)
RDW: 12.8 % (ref 11.5–15.5)
WBC: 5.7 10*3/uL (ref 4.0–10.5)

## 2014-05-15 LAB — BASIC METABOLIC PANEL
BUN: 6 mg/dL (ref 6–23)
CALCIUM: 9.7 mg/dL (ref 8.4–10.5)
CHLORIDE: 100 meq/L (ref 96–112)
CO2: 25 meq/L (ref 19–32)
CREATININE: 0.94 mg/dL (ref 0.50–1.35)
GFR calc Af Amer: >90 mL/min (ref >90)
GFR calc non Af Amer: >90 mL/min (ref >90)
GLUCOSE: 136 mg/dL — AB (ref 70–99)
Potassium: 4.7 mEq/L (ref 3.7–5.3)
Sodium: 138 mEq/L (ref 137–147)

## 2014-05-15 LAB — GLUCOSE, CAPILLARY
GLUCOSE-CAPILLARY: 152 mg/dL — AB (ref 70–99)
Glucose-Capillary: 135 mg/dL — ABNORMAL HIGH (ref 70–99)
Glucose-Capillary: 257 mg/dL — ABNORMAL HIGH (ref 70–99)

## 2014-05-15 MED ORDER — MAGNESIUM SULFATE 4000MG/100ML IJ SOLN
4.0000 g | Freq: Once | INTRAMUSCULAR | Status: AC
  Administered 2014-05-15: 4 g via INTRAVENOUS
  Filled 2014-05-15: qty 100

## 2014-05-15 MED ORDER — PROMETHAZINE HCL 25 MG PO TABS: 25.0000 mg | ORAL_TABLET | Freq: Four times a day (QID) | ORAL | Status: DC | PRN

## 2014-05-15 MED ORDER — METRONIDAZOLE 500 MG PO TABS
500.0000 mg | ORAL_TABLET | Freq: Three times a day (TID) | ORAL | Status: DC
  Administered 2014-05-15 – 2014-05-17 (×7): 500 mg via ORAL
  Filled 2014-05-15 (×10): qty 1

## 2014-05-15 MED ORDER — CIPROFLOXACIN HCL 500 MG PO TABS
500.0000 mg | ORAL_TABLET | Freq: Two times a day (BID) | ORAL | Status: DC
  Administered 2014-05-15 – 2014-05-17 (×5): 500 mg via ORAL
  Filled 2014-05-15 (×7): qty 1

## 2014-05-15 MED ORDER — METRONIDAZOLE 500 MG PO TABS: 500.0000 mg | ORAL_TABLET | Freq: Three times a day (TID) | ORAL | Status: DC

## 2014-05-15 MED ORDER — PANTOPRAZOLE SODIUM 40 MG PO TBEC
40.0000 mg | DELAYED_RELEASE_TABLET | Freq: Every day | ORAL | Status: DC
  Administered 2014-05-15 – 2014-05-17 (×3): 40 mg via ORAL
  Filled 2014-05-15 (×2): qty 1

## 2014-05-15 MED ORDER — OXYCODONE-ACETAMINOPHEN 5-325 MG PO TABS
1.0000 | ORAL_TABLET | ORAL | Status: DC | PRN
  Administered 2014-05-15 – 2014-05-16 (×3): 1 via ORAL
  Filled 2014-05-15 (×3): qty 1

## 2014-05-15 NOTE — Progress Notes (Signed)
Subjective:   Day of hospitalization: 8  VSS.  Pt has been admitted for 8 days with RUQ abdominal pain that has improved.  Surgery and GI have been following.  Still on abx per surgery (day 8).  HIDA scan neg.  lactic acid wnl.  CT shows colitis with resolution of pneumotosis.  No FH of colitis.    Hgb today 12.8. FOBT pos, c.diff neg.  Pt reports he is better this AM with abdominal pain improved.  He has been tolerating a soft diet.  Still has some mild nausea but no vomiting.   Objective:   Vital signs in last 24 hours: Filed Vitals:   05/14/14 0539 05/14/14 1338 05/14/14 2026 05/15/14 0509  BP: 127/78 128/76 135/87 123/71  Pulse: 64 71 66 63  Temp: 98 F (36.7 C) 97.7 F (36.5 C) 98.4 F (36.9 C) 97.4 F (36.3 C)  TempSrc: Oral Oral Oral Oral  Resp: 18 18 18 18   Height:      Weight:      SpO2: 100% 98% 99% 93%    Weight: Filed Weights   05/07/14 1700 05/08/14 0020  Weight: 278 lb 10.6 oz (126.4 kg) 275 lb 12.7 oz (125.1 kg)    Ins/Outs:  Intake/Output Summary (Last 24 hours) at 05/15/14 1352 Last data filed at 05/15/14 0900  Gross per 24 hour  Intake    600 ml  Output   1300 ml  Net   -700 ml   -4.0 since admission.   Physical Exam: Constitutional: Vital signs reviewed.  Patient is lying in bed in no acute distress and cooperative with exam.   HEENT: [place]/AT; PERRL, EOMI, conjunctivae normal, no scleral icterus  Cardiovascular: RRR, no MRG Pulmonary/Chest: normal respiratory effort, no accessory muscle use, CTAB, no wheezes, rales, or rhonchi Abdominal: Obese. Soft. +bs, non-tender, non-distended; no guarding or rebound Neurological: A&O x3, CN II-XII grossly intact; non-focal exam Extremities: 2+DP b/l, no C/C/E  Skin: Warm, dry and intact. No rash  Lab Results:  BMP:  Recent Labs Lab 05/12/14 0310 05/13/14 0055 05/14/14 0400 05/15/14 0435  NA 139 137 138 138  K 3.2* 3.9 4.2 4.7  CL 100 97 100 100  CO2 28 26 30 25   GLUCOSE 141* 128* 123* 136*   BUN 8 7 7 6   CREATININE 0.95 0.91 1.03 0.94  CALCIUM 8.5 9.5 9.7 9.7  MG 1.0* 1.6 1.4* 1.2*  PHOS  --  3.5  --   --    Anion Gap:  13  CBC:  Recent Labs Lab 05/10/14 0325  05/14/14 0908 05/15/14 0435  WBC 9.4  < > 6.8 5.7  NEUTROABS 6.4  --   --   --   HGB 11.4*  < > 12.1* 12.8*  HCT 35.3*  < > 37.0* 38.5*  MCV 90.1  < > 88.5 88.1  PLT 188  < > 268 273  < > = values in this interval not displayed.  Coagulation: No results found for this basename: LABPROT, INR,  in the last 168 hours  CBG:            Recent Labs Lab 05/14/14 1127 05/14/14 1631 05/14/14 2111 05/14/14 2353 05/15/14 0608 05/15/14 1121  GLUCAP 205* 135* 157* 162* 135* 152*           HA1C:      No results found for this basename: HGBA1C,  in the last 168 hours LFTs:  Recent Labs Lab 05/10/14 0830  AST 21  ALT 12  ALKPHOS 53  BILITOT 0.9  PROT 6.2  ALBUMIN 2.8*    Pancreatic Enzymes:  Recent Labs Lab 05/08/14 1714  AMYLASE 34    Lactic Acid/Procalcitonin:  Recent Labs Lab 05/09/14 0238 05/10/14 0755 05/12/14 0310  LATICACIDVEN 0.7 1.1 0.7    Ammonia: No results found for this basename: AMMONIA,  in the last 168 hours  Cardiac Enzymes: No results found for this basename: CKTOTAL, CKMB, CKMBINDEX, TROPONINI,  in the last 168 hours  EKG: EKG Interpretation  Date/Time:  Monday May 07 2014 15:59:33 EDT Ventricular Rate:  82 PR Interval:  177 QRS Duration: 101 QT Interval:  367 QTC Calculation: 429 R Axis:   61 Text Interpretation:  Sinus rhythm RSR' in V1 or V2, right VCD or RVH Abnormal inferior Q waves Borderline ST elevation, inferior leads Confirmed by ZAVITZ  MD, JOSHUA ([id number]) on 05/07/2014 4:12:03 PM   BNP: No results found for this basename: PROBNP,  in the last 168 hours  D-Dimer: No results found for this basename: DDIMER,  in the last 168 hours  Urinalysis: No results found for this basename: COLORURINE, APPERANCEUR, LABSPEC, PHURINE, GLUCOSEU, HGBUR,  BILIRUBINUR, KETONESUR, PROTEINUR, UROBILINOGEN, NITRITE, LEUKOCYTESUR,  in the last 168 hours  Micro Results: Recent Results (from the past 240 hour(s))  CULTURE, BLOOD (ROUTINE X 2)     Status: None   Collection Time    05/07/14  7:26 PM      Result Value Ref Range Status   Specimen Description BLOOD HAND RIGHT   Final   Special Requests BOTTLES DRAWN AEROBIC ONLY 2CC   Final   Culture  Setup Time     Final   Value: 05/08/2014 00:40     Performed at [facility]   Culture     Final   Value: NO GROWTH 5 DAYS     Performed at [facility]   Report Status 05/14/2014 FINAL   Final  CULTURE, BLOOD (ROUTINE X 2)     Status: None   Collection Time    05/07/14  7:30 PM      Result Value Ref Range Status   Specimen Description BLOOD ARM LEFT   Final   Special Requests BOTTLES DRAWN AEROBIC AND ANAEROBIC [facility]   Final   Culture  Setup Time     Final   Value: 05/08/2014 00:41     Performed at [facility]   Culture     Final   Value: NO GROWTH 5 DAYS     Performed at [facility]   Report Status 05/14/2014 FINAL   Final  MRSA PCR SCREENING     Status: None   Collection Time    05/08/14 12:10 AM      Result Value Ref Range Status   MRSA by PCR NEGATIVE  NEGATIVE Final   Comment:            The GeneXpert MRSA Assay (FDA     approved for NASAL specimens     only), is one component of a     comprehensive MRSA colonization     surveillance program. It is not     intended to diagnose MRSA     infection nor to guide or     monitor treatment for     MRSA infections.  CLOSTRIDIUM DIFFICILE BY PCR     Status: None   Collection Time    05/14/14  3:12 PM      Result Value Ref Range Status  C difficile by pcr NEGATIVE  NEGATIVE Final    Blood Culture:    Component Value Date/Time   SDES BLOOD ARM LEFT 05/07/2014 1930    Studies/Results: No results found.  Medications:  Scheduled Meds: . ciprofloxacin  500 mg Oral BID  . gabapentin  600 mg Oral  TID  . insulin aspart  0-9 Units Subcutaneous 4 times per day  . magnesium sulfate 1 - 4 g bolus IVPB  2 g Intravenous Once  . metroNIDAZOLE  500 mg Oral 3 times per day  . pantoprazole  40 mg Oral Daily   Continuous Infusions:   PRN Meds: ondansetron, oxyCODONE-acetaminophen, promethazine  Antibiotics: Antibiotics Given (last 72 hours)   Date/Time Action Medication Dose Rate   05/12/14 2202 Given   ciprofloxacin (CIPRO) IVPB 400 mg 400 mg 200 mL/hr   05/13/14 1003 Given   ciprofloxacin (CIPRO) IVPB 400 mg 400 mg 200 mL/hr   05/13/14 2156 Given   ciprofloxacin (CIPRO) IVPB 400 mg 400 mg 200 mL/hr   05/14/14 1042 Given   ciprofloxacin (CIPRO) IVPB 400 mg 400 mg 200 mL/hr   05/14/14 2229 Given   ciprofloxacin (CIPRO) IVPB 400 mg 400 mg 200 mL/hr   05/15/14 1050 Given   ciprofloxacin (CIPRO) tablet 500 mg 500 mg       Day of Hospitalization: 8  Consults: Treatment Team:  Ladene Artist, MDSurgery  Assessment/Plan:   Principal Problem:   Pneumatosis of intestines Active Problems:   Type II or unspecified type diabetes mellitus without mention of complication, uncontrolled   Hyperlipidemia   Diabetic neuropathy   Syncope   Hypotension   Abdominal pain, unspecified site   Unspecified protein-calorie malnutrition   Abdominal pain, other specified site   Ischemic colitis   Diarrhea   Blood in stool  Diarrhea and nausea with BRBPR Improved.  No more episodes of BRBPR. Had an episode of diarrhea with BRB in the toilet yesterday.  FOBT pos. C.diff neg. No h/o hemorrhoids.  Hgb stable.   RUQ abdominal pain w/ associated colitis on CT Pt reports pain has improved and associated with some nausea at times.  Non-tender on exam.  No vomiting.  Had BM yesterday with associated with BRBPR.  Pt with h/o GERD.  Pt admitted w/ epigastric pain followed by episode of syncope. Surgery consulted and does not recommend surgical intervention at this time given VSS without peritonitis.   Lactic acid wnl therefore ischemic bowel unlikely-no peritoneal signs.  Pneumatosis has resolved.  Amylase, lactic acid, LFTs wnl.  RUQ U/S and HIDA scan non-revealing.  Repeat CT revealed resolution of pneumatosis with worsened colitis.  GI advanced diet with outpatient colonoscopy in several weeks to r/o other causes of colitis.  -continue cipro and flagyl changed to po for total of 14 days -BCx pending (NGTD) -I/O's  -protonix 40mg  po  -phenergan q6h prn po -advance diet today -surgery signed off, GI following appreciate recs  Syncope VSS.  No further episodes.  Etiology most likely orthostatic hypotension.  Pt works at [employer] and reports that it was very hot and did not feel well.  He reports BP sometimes runs low because he feels that he is taking too much BP medication which is prescribed by a NP at his employer.  He needs a PCP. Trops x 3 neg.  Echo revealed LVEF of 55-60% with grade 1 diastolic dysfunction.  -PT consulted recommends [facility] PT with rolling walker   AKI Resolved.  Pt w/ Cr of 2.06 on  admission, baseline unknown.  Cr trended down after IVF.   -d/c home prinzide   h/o HTN Pt on lisinopril-HCTZ 20-25 at home with hypotensive on admission.  Would not give BP meds at this point. -d/c home meds  Dyslipidemia On Pravachol 40 mg qhs at home.  -hold Pravastatin for now  DMII w/ neuropathy Lab Results  Component Value Date   HGBA1C 6.5* 05/08/2014  Takes metformin 1000 mg bid, liraglutide, glipizide 2.5 mg qd at home.  Sees Dr. Dwyane Dee (endocrinologist).  -Hold home meds  -SSI-S -cbgs  -continue neurontin   Deconditioning PT consulted recommending HH PT and rolling walker.    VTE PPx SCD  Disposition Disposition is deferred, awaiting improvement of current medical problems.  Anticipated discharge in approximately tomorrow.     LOS: 8 days   Jones Bales, MD PGY-1, Internal Medicine Teaching Service [phone] (7AM-5PM Mon-Fri) 05/15/2014, 1:52  PM

## 2014-05-15 NOTE — Progress Notes (Signed)
    Day 8 of stay      Patient name: Jack Wallace  Medical record number: [id number]  Date of birth: 04/02/1961    Doing much better. Pain improved, on soft diet. Will see if he can advance diet today.  Exam - very little tenderness to palpation today. Cardiac - RRR, No pedal edema.  Labs and imaging reviewed.    Assessment and Plan    Ischemic colitis - continue to advance diet. All medications transitioned to oral.   Improved pain control.   Diarrhea likely secondary to antibiotics. Cdiff negative  Working on ambulation with PT.  Hypertension - Doing well without BP meds.     I have discussed the care of this patient with my IM team residents. Please see the resident note for details.  Taytum Scheck 05/15/2014, 1:58 PM.

## 2014-05-15 NOTE — Progress Notes (Signed)
Nutrition Brief Note  Patient identified on the Malnutrition Screening Tool (MST) Report for recent weight lost without trying.  Per readings below, patient has had a 3% weight loss since November 2014; not significant for time frame.  Wt Readings from Last 15 Encounters:  05/08/14 275 lb 12.7 oz (125.1 kg)  02/19/14 278 lb 9.6 oz (126.372 kg)  12/11/13 278 lb 8 oz (126.327 kg)  11/06/13 283 lb 12.8 oz (128.731 kg)  10/06/13 288 lb 11.2 oz (130.953 kg)  09/22/13 287 lb 14.4 oz (130.591 kg)  08/25/13 295 lb 4.8 oz (133.947 kg)  07/31/13 296 lb 6.4 oz (134.446 kg)    Body mass index is 36.39 kg/(m^2). Patient meets criteria for Obesity Class II based on current BMI.   Current diet order is Soft, patient is consuming approximately 100% of meals at this time. Labs and medications reviewed.   No nutrition interventions warranted at this time. If nutrition issues arise, please consult RD.   Arthur Holms, RD, LDN Pager #: [phone] After-Hours Pager #: [phone]

## 2014-05-15 NOTE — Progress Notes (Signed)
[facility] Progress Note  Subjective:  Had an episode of diarrhea this AM.  Some right sided pain this AM but no tenderness on exam; pain comes and goes.  Objective:  Vital signs in last 24 hours: Temp:  [97.4 F (36.3 C)-98.4 F (36.9 C)] 97.4 F (36.3 C) (05/26 0509) Pulse Rate:  [63-71] 63 (05/26 0509) Resp:  [18] 18 (05/26 0509) BP: (123-135)/(71-87) 123/71 mmHg (05/26 0509) SpO2:  [93 %-99 %] 93 % (05/26 0509) Last BM Date: 05/14/14 General:  Alert, Well-developed, in NAD Heart:  Regular rate and rhythm; no murmurs Pulm:  CTAB.  No W/R/R. Abdomen:  Soft, non-distended. Normal bowel sounds.  Non-tender.  Extremities:  Without edema. Neurologic:  Alert and  oriented x4;  grossly normal neurologically. Psych:  Alert and cooperative. Normal mood and affect.  Intake/Output from previous day: 05/25 0701 - 05/26 0700 In: 1080 [P.O.:1080] Out: 2200 [Urine:2200]  Lab Results:  Recent Labs  05/14/14 0908 05/15/14 0435  WBC 6.8 5.7  HGB 12.1* 12.8*  HCT 37.0* 38.5*  PLT 268 273   BMET  Recent Labs  05/13/14 0055 05/14/14 0400 05/15/14 0435  NA 137 138 138  K 3.9 4.2 4.7  CL 97 100 100  CO2 26 30 25   GLUCOSE 128* 123* 136*  BUN 7 7 6   CREATININE 0.91 1.03 0.94  CALCIUM 9.5 9.7 9.7   Assessment / Plan: -Acute ischemic event to right colon, ? small vessel disease. His pain is steadily improving. Diarrhea might be related to antibiotics, which are empiric and may be able to discontinue them upon discharge. Limited rectal bleeding likely from colitis.  C diff negative.  *Continue PPI daily for possible GERD  *Advance diet to low fiber today.  *Encouraged OOB and ambulation  *He will need colonoscopy in several weeks as outpatient.     LOS: 8 days   Laban Emperor. Zehr  05/15/2014, 8:49 AM  Pager number [phone]  GI ATTENDING  Interval history and data reviewed. Agree with H&P as outlined above. The patient sleeping. Case discussed at morning report  with Dr. Fuller Plan. Felt to have a vascular insult to the right colon. He continues to improve. Abdomen today is benign. Advance diet as tolerated  Docia Chuck. Geri Seminole., M.D. [facility] Division of Gastroenterology

## 2014-05-15 NOTE — Progress Notes (Signed)
[facility] Progress Note    Subjective: Patient sitting up eating breakfast, no complaints.    Objective: Vital signs in last 24 hours: Temp:  [97.4 F (36.3 C)-98.4 F (36.9 C)] 97.4 F (36.3 C) (05/26 0509) Pulse Rate:  [63-71] 63 (05/26 0509) Resp:  [18] 18 (05/26 0509) BP: (123-135)/(71-87) 123/71 mmHg (05/26 0509) SpO2:  [93 %-99 %] 93 % (05/26 0509) Last BM Date: 05/14/14  Intake/Output from previous day: 05/25 0701 - 05/26 0700 In: 1080 [P.O.:1080] Out: 2200 [Urine:2200] Intake/Output this shift:    General: No acute distress  Lungs: Clear  Abd: Soft, nontender, good bowel sounds.  No tenderness.  Extremities: No changes  Neuro: Intact  Lab Results:  @LABLAST2 (wbc:2,hgb:2,hct:2,plt:2) BMET  Recent Labs  05/14/14 0400 05/15/14 0435  NA 138 138  K 4.2 4.7  CL 100 100  CO2 30 25  GLUCOSE 123* 136*  BUN 7 6  CREATININE 1.03 0.94  CALCIUM 9.7 9.7   PT/INR No results found for this basename: LABPROT, INR,  in the last 72 hours ABG No results found for this basename: PHART, PCO2, PO2, HCO3,  in the last 72 hours  Studies/Results: No results found.  Anti-infectives: Anti-infectives   Start     Dose/Rate Route Frequency Ordered Stop   05/08/14 1030  ciprofloxacin (CIPRO) IVPB 400 mg     400 mg 200 mL/hr over 60 Minutes Intravenous Every 12 hours 05/08/14 0924     05/07/14 2130  metroNIDAZOLE (FLAGYL) IVPB 500 mg     500 mg 100 mL/hr over 60 Minutes Intravenous Every 8 hours 05/07/14 2129     05/07/14 1730  piperacillin-tazobactam (ZOSYN) IVPB 3.375 g     3.375 g 100 mL/hr over 30 Minutes Intravenous  Once 05/07/14 1721 05/07/14 1952      Assessment/Plan: s/p  Advance diet Surgery will sign off.  LOS: 8 days   Kathryne Eriksson. Dahlia Bailiff, MD, FACS [phone] [facility] Surgery 05/15/2014

## 2014-05-16 ENCOUNTER — Encounter: Payer: Self-pay | Admitting: Gastroenterology

## 2014-05-16 LAB — GLUCOSE, CAPILLARY
GLUCOSE-CAPILLARY: 166 mg/dL — AB (ref 70–99)
GLUCOSE-CAPILLARY: 180 mg/dL — AB (ref 70–99)
GLUCOSE-CAPILLARY: 199 mg/dL — AB (ref 70–99)
Glucose-Capillary: 162 mg/dL — ABNORMAL HIGH (ref 70–99)
Glucose-Capillary: 221 mg/dL — ABNORMAL HIGH (ref 70–99)
Glucose-Capillary: 138 mg/dL — ABNORMAL HIGH (ref 70–99)

## 2014-05-16 LAB — BASIC METABOLIC PANEL
BUN: 10 mg/dL (ref 6–23)
CALCIUM: 9.5 mg/dL (ref 8.4–10.5)
CHLORIDE: 100 meq/L (ref 96–112)
CO2: 27 meq/L (ref 19–32)
CREATININE: 1.02 mg/dL (ref 0.50–1.35)
GFR calc Af Amer: >90 mL/min (ref >90)
GFR calc non Af Amer: 82 mL/min — ABNORMAL LOW (ref >90)
Glucose, Bld: 149 mg/dL — ABNORMAL HIGH (ref 70–99)
Potassium: 3.8 mEq/L (ref 3.7–5.3)
SODIUM: 139 meq/L (ref 137–147)

## 2014-05-16 LAB — CBC
HEMATOCRIT: 37.5 % — AB (ref 39.0–52.0)
HEMOGLOBIN: 12.6 g/dL — AB (ref 13.0–17.0)
MCH: 29.5 pg (ref 26.0–34.0)
MCHC: 33.6 g/dL (ref 30.0–36.0)
MCV: 87.8 fL (ref 78.0–100.0)
Platelets: 311 10*3/uL (ref 150–400)
RBC: 4.27 MIL/uL (ref 4.22–5.81)
RDW: 12.8 % (ref 11.5–15.5)
WBC: 7.6 10*3/uL (ref 4.0–10.5)

## 2014-05-16 LAB — MAGNESIUM: Magnesium: 1.5 mg/dL (ref 1.5–2.5)

## 2014-05-16 NOTE — Progress Notes (Signed)
     [facility] Progress Note  Subjective:  Feels ok.  Still gets some pain after eating.  Diarrhea is much less.    Objective:  Vital signs in last 24 hours: Temp:  [97.4 F (36.3 C)-97.9 F (36.6 C)] 97.6 F (36.4 C) (05/27 0426) Pulse Rate:  [74-98] 74 (05/27 0426) Resp:  [18] 18 (05/27 0426) BP: (126-129)/(76-86) 126/86 mmHg (05/27 0426) SpO2:  [94 %-100 %] 97 % (05/27 0426) Last BM Date: 05/15/14 General:  Alert, Well-developed, in NAD Heart:  Regular rate and rhythm; no murmurs Pulm:  CTAB.  No W/R/R. Abdomen:  Soft, obese, non-distended. Normal bowel sounds.  Non-tender. Extremities:  Without edema. Neurologic:  Alert and oriented x4;  grossly normal neurologically. Psych:  Alert and cooperative. Normal mood and affect.  Intake/Output from previous day: 05/26 0701 - 05/27 0700 In: 1200 [P.O.:1200] Out: 400 [Urine:400]  Lab Results:  Recent Labs  05/14/14 0908 05/15/14 0435 05/16/14 0355  WBC 6.8 5.7 7.6  HGB 12.1* 12.8* 12.6*  HCT 37.0* 38.5* 37.5*  PLT 268 273 311   BMET  Recent Labs  05/14/14 0400 05/15/14 0435 05/16/14 0355  NA 138 138 139  K 4.2 4.7 3.8  CL 100 100 100  CO2 30 25 27   GLUCOSE 123* 136* 149*  BUN 7 6 10   CREATININE 1.03 0.94 1.02  CALCIUM 9.7 9.7 9.5   Assessment / Plan: -Acute ischemic event to right colon, ? small vessel disease. His pain is steadily improving. Diarrhea might be related to antibiotics, which are empiric and can discontinue them upon discharge. Limited rectal bleeding likely from colitis. C diff negative.   *Continue PPI daily for possible GERD  *Continue low fiber/low residue diet for now. *Encouraged OOB and ambulation  *He will need colonoscopy in several weeks as outpatient, which can be discussed and scheduled at his follow-up visit with Dr. Fuller Plan in a few weeks.  Patient is ok for discharge from a GI standpoint tomorrow if no issues overnight.     LOS: 9 days   Laban Emperor. Zehr   05/16/2014, 9:01 AM  Pager number [phone]  GI ATTENDING  Interval history and data reviewed. Patient personally seen and examined. No new complaints. Blood work stable. Tolerating diet with minimal complaints. Recommend observing him overnight. Ambulating. If no further problems, can be discharged home tomorrow with followup with Dr. Fuller Plan in a few weeks. Okay to discontinue antibiotics as well. We're available as needed. Will sign off. Discussed with patient. Thank you  Docia Chuck. Geri Seminole., M.D. [facility] Division of Gastroenterology

## 2014-05-16 NOTE — Progress Notes (Signed)
    Day 9 of stay      Patient name: Jack Wallace  Medical record number: [id number]  Date of birth: 23-Jul-1961   Mr Petrich tolerated lunch and dinner yesterday (regular diet) moderately well. He did have some abdominal pain after dinner. We will continue to monitor him overnight.    I have discussed the care of this patient with my IM team residents. Please see the resident note for details.  Shemica Meath 05/16/2014, 4:02 PM.

## 2014-05-16 NOTE — Progress Notes (Signed)
Subjective:   Day of hospitalization: 9  VSS.  Pt with RUQ abdominal pain that has improved.  Surgery and GI have been following.  Still on abx per surgery (day 9).  HIDA scan neg.  lactic acid wnl.  CT shows colitis with resolution of pneumotosis.  No FH of colitis.    Hgb today 12.6. FOBT pos, c.diff neg.  Pt reports he is better this AM with abdominal pain improved.  He has been tolerating a low fiber diet.  Still has some mild nausea with meatloaf but no vomiting.   Objective:   Vital signs in last 24 hours: Filed Vitals:   05/15/14 1432 05/15/14 2007 05/16/14 0426 05/16/14 1400  BP: 129/76 127/79 126/86 134/84  Pulse: 98 77 74 80  Temp: 97.4 F (36.3 C) 97.9 F (36.6 C) 97.6 F (36.4 C) 97.6 F (36.4 C)  TempSrc: Oral Oral Oral Oral  Resp: 18 18 18 18   Height:      Weight:      SpO2: 94% 100% 97% 98%    Weight: Filed Weights   05/07/14 1700 05/08/14 0020  Weight: 278 lb 10.6 oz (126.4 kg) 275 lb 12.7 oz (125.1 kg)    Ins/Outs:  Intake/Output Summary (Last 24 hours) at 05/16/14 1612 Last data filed at 05/16/14 1500  Gross per 24 hour  Intake    960 ml  Output      0 ml  Net    960 ml   -3.1 since admission.   Physical Exam: Constitutional: Vital signs reviewed.  Patient is lying in bed in no acute distress and cooperative with exam.   HEENT: [place]/AT; PERRL, EOMI, conjunctivae normal, no scleral icterus  Cardiovascular: RRR, no MRG Pulmonary/Chest: normal respiratory effort, no accessory muscle use, CTAB, no wheezes, rales, or rhonchi Abdominal: Obese. Soft. +bs, non-tender, non-distended; no guarding or rebound Neurological: A&O x3, CN II-XII grossly intact; non-focal exam Extremities: 2+DP b/l, no C/C/E  Skin: Warm, dry and intact. No rash  Lab Results:  BMP:  Recent Labs Lab 05/12/14 0310 05/13/14 0055  05/15/14 0435 05/16/14 0355  NA 139 137  < > 138 139  K 3.2* 3.9  < > 4.7 3.8  CL 100 97  < > 100 100  CO2 28 26  < > 25 27  GLUCOSE 141*  128*  < > 136* 149*  BUN 8 7  < > 6 10  CREATININE 0.95 0.91  < > 0.94 1.02  CALCIUM 8.5 9.5  < > 9.7 9.5  MG 1.0* 1.6  < > 1.2* 1.5  PHOS  --  3.5  --   --   --   < > = values in this interval not displayed. Anion Gap:  12  CBC:  Recent Labs Lab 05/10/14 0325  05/15/14 0435 05/16/14 0355  WBC 9.4  < > 5.7 7.6  NEUTROABS 6.4  --   --   --   HGB 11.4*  < > 12.8* 12.6*  HCT 35.3*  < > 38.5* 37.5*  MCV 90.1  < > 88.1 87.8  PLT 188  < > 273 311  < > = values in this interval not displayed.  Coagulation: No results found for this basename: LABPROT, INR,  in the last 168 hours  CBG:            Recent Labs Lab 05/15/14 1121 05/15/14 1622 05/15/14 2354 05/16/14 0352 05/16/14 0619 05/16/14 1134  GLUCAP 152* 257* 180* 166* 162* 221*  HA1C:      No results found for this basename: HGBA1C,  in the last 168 hours LFTs:  Recent Labs Lab 05/10/14 0830  AST 21  ALT 12  ALKPHOS 53  BILITOT 0.9  PROT 6.2  ALBUMIN 2.8*    Pancreatic Enzymes: No results found for this basename: LIPASE, AMYLASE,  in the last 168 hours  Lactic Acid/Procalcitonin:  Recent Labs Lab 05/10/14 0755 05/12/14 0310  LATICACIDVEN 1.1 0.7    Ammonia: No results found for this basename: AMMONIA,  in the last 168 hours  Cardiac Enzymes: No results found for this basename: CKTOTAL, CKMB, CKMBINDEX, TROPONINI,  in the last 168 hours  EKG: EKG Interpretation  Date/Time:  Monday May 07 2014 15:59:33 EDT Ventricular Rate:  82 PR Interval:  177 QRS Duration: 101 QT Interval:  367 QTC Calculation: 429 R Axis:   61 Text Interpretation:  Sinus rhythm RSR' in V1 or V2, right VCD or RVH Abnormal inferior Q waves Borderline ST elevation, inferior leads Confirmed by ZAVITZ  MD, JOSHUA ([id number]) on 05/07/2014 4:12:03 PM   BNP: No results found for this basename: PROBNP,  in the last 168 hours  D-Dimer: No results found for this basename: DDIMER,  in the last 168  hours  Urinalysis: No results found for this basename: COLORURINE, APPERANCEUR, LABSPEC, PHURINE, GLUCOSEU, HGBUR, BILIRUBINUR, KETONESUR, PROTEINUR, UROBILINOGEN, NITRITE, LEUKOCYTESUR,  in the last 168 hours  Micro Results: Recent Results (from the past 240 hour(s))  CULTURE, BLOOD (ROUTINE X 2)     Status: None   Collection Time    05/07/14  7:26 PM      Result Value Ref Range Status   Specimen Description BLOOD HAND RIGHT   Final   Special Requests BOTTLES DRAWN AEROBIC ONLY 2CC   Final   Culture  Setup Time     Final   Value: 05/08/2014 00:40     Performed at [facility]   Culture     Final   Value: NO GROWTH 5 DAYS     Performed at [facility]   Report Status 05/14/2014 FINAL   Final  CULTURE, BLOOD (ROUTINE X 2)     Status: None   Collection Time    05/07/14  7:30 PM      Result Value Ref Range Status   Specimen Description BLOOD ARM LEFT   Final   Special Requests BOTTLES DRAWN AEROBIC AND ANAEROBIC [facility]   Final   Culture  Setup Time     Final   Value: 05/08/2014 00:41     Performed at [facility]   Culture     Final   Value: NO GROWTH 5 DAYS     Performed at [facility]   Report Status 05/14/2014 FINAL   Final  MRSA PCR SCREENING     Status: None   Collection Time    05/08/14 12:10 AM      Result Value Ref Range Status   MRSA by PCR NEGATIVE  NEGATIVE Final   Comment:            The GeneXpert MRSA Assay (FDA     approved for NASAL specimens     only), is one component of a     comprehensive MRSA colonization     surveillance program. It is not     intended to diagnose MRSA     infection nor to guide or     monitor treatment for     MRSA  infections.  CLOSTRIDIUM DIFFICILE BY PCR     Status: None   Collection Time    05/14/14  3:12 PM      Result Value Ref Range Status   C difficile by pcr NEGATIVE  NEGATIVE Final    Blood Culture:    Component Value Date/Time   SDES BLOOD ARM LEFT 05/07/2014 1930     Studies/Results: No results found.  Medications:  Scheduled Meds: . ciprofloxacin  500 mg Oral BID  . gabapentin  600 mg Oral TID  . insulin aspart  0-9 Units Subcutaneous 4 times per day  . magnesium sulfate 1 - 4 g bolus IVPB  2 g Intravenous Once  . metroNIDAZOLE  500 mg Oral 3 times per day  . pantoprazole  40 mg Oral Daily   Continuous Infusions:   PRN Meds: ondansetron, oxyCODONE-acetaminophen, promethazine  Antibiotics: Antibiotics Given (last 72 hours)   Date/Time Action Medication Dose Rate   05/13/14 2156 Given   ciprofloxacin (CIPRO) IVPB 400 mg 400 mg 200 mL/hr   05/14/14 1042 Given   ciprofloxacin (CIPRO) IVPB 400 mg 400 mg 200 mL/hr   05/14/14 2229 Given   ciprofloxacin (CIPRO) IVPB 400 mg 400 mg 200 mL/hr   05/15/14 1050 Given   ciprofloxacin (CIPRO) tablet 500 mg 500 mg    05/15/14 1652 Given   metroNIDAZOLE (FLAGYL) tablet 500 mg 500 mg    05/15/14 2139 Given   ciprofloxacin (CIPRO) tablet 500 mg 500 mg    05/15/14 2139 Given   metroNIDAZOLE (FLAGYL) tablet 500 mg 500 mg    05/16/14 4709 Given   metroNIDAZOLE (FLAGYL) tablet 500 mg 500 mg    05/16/14 1050 Given   ciprofloxacin (CIPRO) tablet 500 mg 500 mg    05/16/14 1240 Given   metroNIDAZOLE (FLAGYL) tablet 500 mg 500 mg       Day of Hospitalization: 9  Consults:  Surgery  Assessment/Plan:   Principal Problem:   Pneumatosis of intestines Active Problems:   Type II or unspecified type diabetes mellitus without mention of complication, uncontrolled   Hyperlipidemia   Diabetic neuropathy   Syncope   Hypotension   Abdominal pain, unspecified site   Unspecified protein-calorie malnutrition   Abdominal pain, other specified site   Ischemic colitis   Diarrhea   Blood in stool  Diarrhea and nausea with BRBPR Improved.  No more episodes of BRBPR. FOBT pos in setting of ischemic colitis.  C.diff neg. No h/o hemorrhoids.  Hgb stable.  RUQ abdominal pain w/ associated colitis on CT Pt  reports pain has improved.  Nausea with meatloaf.  Non-tender on exam.  No vomiting.  Had BM yesterday with associated with BRBPR.  Pt with h/o GERD.  Pt admitted w/ epigastric pain followed by episode of syncope. Surgery consulted and does not recommend surgical intervention at this time given VSS without peritonitis.  Lactic acid wnl therefore ischemic bowel unlikely-no peritoneal signs.  Pneumatosis has resolved.  Amylase, lactic acid, LFTs wnl.  RUQ U/S and HIDA scan non-revealing.  Repeat CT revealed resolution of pneumatosis with worsened colitis.  GI: outpatient colonoscopy in several weeks to r/o other causes of colitis.  Recommended to observe overnight.  BCx NG Final.  -continue cipro and flagyl changed to po for total of 14 days (day 9 of cipro/day 10 of flagyl) -I/O's  -protonix 40mg  po  -phenergan q6h prn po -low fiber diet -surgery signed off, GI following appreciate recs  Syncope VSS.  No further episodes.  Etiology most  likely orthostatic hypotension.  Pt works at [employer] and reports that it was very hot and did not feel well.  He reports BP sometimes runs low because he feels that he is taking too much BP medication which is prescribed by a NP at his employer.  He needs a PCP. Trops x 3 neg.  Echo revealed LVEF of 55-60% with grade 1 diastolic dysfunction.  -PT consulted recommends [facility] PT with rolling walker   AKI Resolved.  Pt w/ Cr of 2.06 on admission, baseline unknown.  Cr trended down after IVF.   -d/c home prinzide   h/o HTN Pt on lisinopril-HCTZ 20-25 at home with hypotensive on admission.  Would not give BP meds at this point. -d/c home meds  Dyslipidemia On Pravachol 40 mg qhs at home.  -hold Pravastatin for now  DMII w/ neuropathy Lab Results  Component Value Date   HGBA1C 6.5* 05/08/2014  Takes metformin 1000 mg bid, liraglutide, glipizide 2.5 mg qd at home.  Sees Dr. Dwyane Dee (endocrinologist).  -Hold home meds  -SSI-S -cbgs  -continue neurontin    Deconditioning PT consulted recommending HH PT and rolling walker.    VTE PPx SCD  Disposition Anticipated discharge in approximately tomorrow with follow up in [facility].     LOS: 9 days   Jones Bales, MD PGY-1, Internal Medicine Teaching Service [phone] (7AM-5PM Mon-Fri) 05/16/2014, 4:12 PM

## 2014-05-16 NOTE — Progress Notes (Signed)
Physical Therapy Treatment Patient Details Name: Jack Wallace MRN: [id number] DOB: [birth year] Today's Date: 05/16/2014    History of Present Illness [name] is a 53 y.o. male w/ PMHx of HTN, HLD, DM type II, presented to the ED after an episode of syncope and abdominal pain. He was admitted with dx of pneumatosis of intestines.    PT Comments    Progressing well with mobility. Encouraged pt to take RW home to use if/when needed. Continue to recommend HHPT for higher level balance training. Possible d/c today or tomorrow per pt  Follow Up Recommendations  Home health PT;Supervision - Intermittent     Equipment Recommendations  Rolling walker with 5" wheels    Recommendations for Other Services       Precautions / Restrictions Precautions Precautions: Fall Restrictions Weight Bearing Restrictions: No    Mobility  Bed Mobility Overal bed mobility: Independent                Transfers Overall transfer level: Modified independent                  Ambulation/Gait Ambulation/Gait assistance: Supervision Ambulation Distance (Feet): 500 Feet (at least) Assistive device: None Gait Pattern/deviations: Step-through pattern;Decreased stride length     General Gait Details: worked on head turns, change in direction-noted some wobbling, mild unsteadiness with these. Pt also reported lightheadedness.   Stairs Stairs: Yes Stairs assistance: Min guard Stair Management: One rail Right;Two rails;Step to pattern;Forwards Number of Stairs: 10 General stair comments: 1 misstep when pt begain using reciprocal pattern. Pt adjusted to step to pattern for remainder of practice.   Wheelchair Mobility    Modified Rankin (Stroke Patients Only)       Balance Overall balance assessment: Needs assistance                           High level balance activites: Side stepping;Backward walking;Direction changes;Turns;Head turns High Level Balance  Comments: Slightly unsteady with high level balance activities. Practiced all of the above. No overt LOB with any.     Cognition Arousal/Alertness: Awake/alert Behavior During Therapy: WFL for tasks assessed/performed Overall Cognitive Status: Within Functional Limits for tasks assessed                      Exercises      General Comments General comments (skin integrity, edema, etc.): Had pt perform p/u object, 360 degree turns, narrow BOS, EO/EC-no difficulty      Pertinent Vitals/Pain Pt denied pain    Home Living                      Prior Function            PT Goals (current goals can now be found in the care plan section) Progress towards PT goals: Progressing toward goals    Frequency  Min 3X/week    PT Plan Current plan remains appropriate    Co-evaluation             End of Session Equipment Utilized During Treatment: Gait belt Activity Tolerance: Patient tolerated treatment well Patient left: in bed;with call bell/phone within reach     Time: 0845-0900 PT Time Calculation (min): 15 min  Charges:  $Gait Training: 8-22 mins                    G Codes:  Weston Anna, MPT Pager: [phone]

## 2014-05-16 NOTE — Progress Notes (Signed)
Pt ambulating in hall independently without complaint.

## 2014-05-17 ENCOUNTER — Encounter: Payer: Self-pay | Admitting: Internal Medicine

## 2014-05-17 LAB — GLUCOSE, CAPILLARY
GLUCOSE-CAPILLARY: 237 mg/dL — AB (ref 70–99)
Glucose-Capillary: 142 mg/dL — ABNORMAL HIGH (ref 70–99)
Glucose-Capillary: 175 mg/dL — ABNORMAL HIGH (ref 70–99)

## 2014-05-17 LAB — MAGNESIUM: Magnesium: 1.3 mg/dL — ABNORMAL LOW (ref 1.5–2.5)

## 2014-05-17 MED ORDER — PANTOPRAZOLE SODIUM 40 MG PO TBEC: 40.0000 mg | DELAYED_RELEASE_TABLET | Freq: Every day | ORAL | Status: DC

## 2014-05-17 MED ORDER — CIPROFLOXACIN HCL 500 MG PO TABS: 500.0000 mg | ORAL_TABLET | Freq: Two times a day (BID) | ORAL | Status: DC

## 2014-05-17 MED ORDER — METRONIDAZOLE 500 MG PO TABS: 500.0000 mg | ORAL_TABLET | Freq: Three times a day (TID) | ORAL | Status: DC

## 2014-05-17 MED ORDER — PROMETHAZINE HCL 25 MG PO TABS: 25.0000 mg | ORAL_TABLET | Freq: Three times a day (TID) | ORAL | Status: DC | PRN

## 2014-05-17 MED ORDER — MAGNESIUM OXIDE 400 (241.3 MG) MG PO TABS
400.0000 mg | ORAL_TABLET | Freq: Once | ORAL | Status: AC
  Administered 2014-05-17: 400 mg via ORAL
  Filled 2014-05-17 (×2): qty 1

## 2014-05-17 NOTE — Progress Notes (Signed)
    Day 10 of stay      Patient name: Jack Wallace  Medical record number: [id number]  Date of birth: 03-01-1961   Patient tolerated food well yesterday with minimal pain per his report. He endorses that he has improved considerably, and is able to eat selective things right now. His diet mostly consists of mashed potatoes, chicken and soup. His vitals have been stable. He is ambulating independently. He already has been fixed with a follow up with Dr Naaman Plummer in the clinic next week. He will also be scheduled with GI for a colonoscopy. He has been advised not to take his BP medications as he is normotensive without them. This can be followed up by Dr Naaman Plummer in clinic. Okay for discharge home today with home health services.  I have discussed the care of this patient with my IM team residents. Please see the resident note for details.  Ashiah Karpowicz 05/17/2014, 11:33 AM.

## 2014-05-17 NOTE — Progress Notes (Signed)
Pt discharging home.  All instructions given and reviewed, prescriptions called to Pt's pharmacy. F/U appts in place.   All questions answered.

## 2014-05-17 NOTE — Discharge Instructions (Signed)
Please keep your follow-up appointments; this is very important for your continued recovery.    We have made the following additions/changes to your medications:  Do not resume your blood pressure medication.  Please take all your antibiotics that I have sent to your pharmacy.    Please continue to take all of your medications as prescribed.  Do not miss any doses without contacting your primary physician.  If you have questions, please contact your physician or contact the Internal Medicine Teaching Service at [phone].  Please bring your medicications with you to your appointments; medications may be eye drops, herbals, vitamins, or pills.    If you believe you are having a life-threatening emergency, go to the nearest Emergency Department.      [name] Guide  1) Find a Charity fundraiser Although you won't have to find out who is covered by [employer] plan, it is a good idea to ask around and get recommendations. You will then need to call the office and see if the doctor you have chosen will accept you as a new patient and what types of options they offer for patients who are self-pay. Some doctors offer discounts or will set up payment plans for their patients who do not have insurance, but you will need to ask so you aren't surprised when you get to your appointment.  2) Contact Your Local Health Department Not all health departments have doctors that can see patients for sick visits, but many do, so it is worth a call to see if yours does. If you don't know where your local health department is, you can check in your phone book. The CDC also has a tool to help you locate your state's health department, and many state websites also have listings of all of their local health departments.  3) Find a [facility] If your illness is not likely to be very severe or complicated, you may want to try a walk in clinic. These are popping up all over the country in  pharmacies, drugstores, and shopping centers. They're usually staffed by nurse practitioners or physician assistants that have been trained to treat common illnesses and complaints. They're usually fairly quick and inexpensive. However, if you have serious medical issues or chronic medical problems, these are probably not your best option.  No Primary Care Doctor: - Call Health Connect at  ([phone] - they can help you locate a primary care doctor that  accepts your insurance, provides certain services, etc. - Physician Referral Service- [phone]  Chronic Pain Problems: Organization         Address  Phone   Notes  [facility]  [phone] Patients need to be referred by their primary care doctor.   Medication Assistance: Organization         Address  Phone   Notes  [facility] Medication [facility] [address] [place], [place] [address] [phone] --Must be a resident of [facility] -- Must have NO insurance coverage whatsoever (no Medicaid/ Medicare, etc.) -- The pt. MUST have a primary care doctor that directs their care regularly and follows them in the community   Med[facility]ist  [phone]   [employer]  [phone]    Agencies that provide inexpensive medical care: Organization         Address  Phone   Notes  [place]  [phone]   [facility]    (  [phone]) [phone]   °[facility] [address] °[place], [place] [address] (336) 832-4777   °[facility] [address], °[place] (336) 271-4999   °[facility]    (336) 373-0678   °[facility]    (336) 272-1050   °[facility] ° [address], [place] Phone:  (336) 832-4444, Fax:  (336) [phone] Hours of Operation:  9 am - 6 pm, M-F.  Also accepts Medicaid/Medicare and self-pay.  °[facility] ° [address],  [place] Phone: (336) [phone], Fax: ([phone]) [phone]. Hours of Operation:  8:30 am - 5:30 pm, M-F.  Also accepts Medicaid and self-pay.  °[facility] [place] [address], [place] Phone: (336) [phone]   °[facility] [address], [place], [place] (336)723-1848, Ext. 123 Mondays & Thursdays: 7-9 AM.  First 15 patients are seen on a first come, first serve basis. °  ° °Medicaid-accepting [place] Providers: ° °Organization         Address  Phone   Notes  °[facility] [address], [place] ([phone]) [phone] Also accepts self-pay patients.  °[facility] [address], [place] ° (336) [phone]   °[facility] [address], [place] (336) [phone]   °[facility] [address] [place] [address], [place] (336) 299-7000   °Veita Bland [address], [place]  ° ([phone]) [phone] Only accepts [place] Access Medicaid patients after they have their name applied to their card.  ° °Self-Pay (no insurance) in [place]: ° °Organization         Address  Phone   Notes  °Sickle Cell Patients, [facility] [address], [place] (336) 832-1970   °[facility] [address], [place] (336) 832-4400   °[facility] [place] ° [address], [place] (336) [phone]   °[facility]/Dr. Osei-Bonsu ° [address] [place] [address], [place] or [address], [place] ([phone]) [phone] Phone number for both [place] and [place] locations is the same.  °[facility] [address], [place] (336) [phone]   °[facility] [address] [place] [address], [place] or [address] (336) 852-7530 °(336) 878-2260   °[facility] [address], [place] (336) 350-1642, phone; (336) [phone], fax Sees patients 1st and 3rd Saturday of every month.  Must not  qualify for public or private insurance (i.e. Medicaid, Medicare, [place] Health Choice, Veterans' Benefits) • Household income should be no more than 200% of the poverty level •The clinic cannot treat you if you are pregnant or think you are pregnant • Sexually transmitted diseases are not treated at the clinic.  ° ° °Dental Care: °Organization         Address  Phone  Notes  °[place] [facility] [address], [place] (336) [phone] Accepts children up to age 21 who are enrolled in Medicaid or [place] Health Choice; pregnant women with a Medicaid card; and children who have applied for Medicaid or [place] Health Choice, but were declined, whose parents can pay a reduced fee at time of service.  °[place] [facility] [place]  [address], [place] (336) 641-7733 Accepts children up   to age 21 who are enrolled in Medicaid or [place] Health Choice; pregnant women with a Medicaid card; and children who have applied for Medicaid or [place] Health Choice, but were declined, whose parents can pay a reduced fee at time of service.  °[facility] ° [address], [place] (336) [phone] Patients are seen by appointment only. Walk-ins are not accepted. [facility] will see patients 18 years of age and older. °Monday - Tuesday (8am-5pm) °Most Wednesdays (8:30-5pm) °$30 per visit, cash only  °[facility] ° [address], [place] (336) [phone] Patients are seen by appointment only. Walk-ins are not accepted. [facility] will see patients 18 years of age and older. °One Wednesday Evening (Monthly: Volunteer Based).  $30 per visit, cash only  °[facility]  ([phone]) [phone] for adults; Children under age 4, call Graduate Pediatric Dentistry at ([phone]. Children aged 4-14, please call ([phone]) [phone] to request a pediatric application. ° Dental services are provided  in all areas of dental care including fillings, crowns and bridges, complete and partial dentures, implants, gum treatment, root canals, and extractions. Preventive care is also provided. Treatment is provided to both adults and children. °Patients are selected via a lottery and there is often a waiting list. °  °[facility] [address], °[place] ° (336) 763-8833 [email or web address] °  °[facility] [address], [place], [place] (336)723-1848, Ext. 123 Second and Fourth Thursday of each month, opens at 6:30 AM; Clinic ends at 9 AM.  Patients are seen on a first-come first-served basis, and a limited number are seen during each clinic.  ° °[facility] ° [address], [place], [place] (336) [phone]   Eligibility Requirements °You must have lived in [place], [place], or [place] counties for at least the last three months. °  You cannot be eligible for state or federal sponsored healthcare insurance, including Veterans Administration, Medicaid, or Medicare. °  You generally cannot be eligible for healthcare insurance through your employer.  °  How to apply: °Eligibility screenings are held every Tuesday and Wednesday afternoon from 1:00 pm until 4:00 pm. You do not need an appointment for the interview!  °[facility] [address], [place], [place] 336-631-2330   °[facility]  336-342-8273   °[facility]  336-703-3100   °[facility]  336-570-6415   ° °Behavioral Health Resources in the Community: °Intensive Outpatient Programs °Organization         Address  Phone  Notes  °[facility] [address], [place], [place] 336-878-6098   °[facility] [address], [place], [place] 336-832-9800   °[facility] [address], [place], [place] ° 336-882-2125   °[facility] [address],  °[place],   1-800-853-5163 or 336-641-4981   °Substance Abuse Resources °Organization         Address  Phone  Notes  °[facility]  336-882-2125   °[facility]  336-784-9470   °[facility]  336-285-9073   °[facility]  336-845-3988   °Residential & Outpatient Substance Abuse Program  1-800-659-3381   °Psychological Services °Organization         Address  Phone  Notes  °[facility]  336- 832-9600   °[facility]  336- 378-7881   °[facility]   [address] [place] or 618-621-7964    Mobile Crisis Teams Organization         Address  Phone  Notes  [facility], Mobile Crisis Care Unit  269-307-7358   [facility]  [address] [place], [place]   [place] [facility] [address], [place] (317)[phone]    Self-Help/Support Groups Organization         Address  Phone             Notes  [place] - variety of support groups  [place] Call for more information  Narcotics Anonymous (NA), [facility] [address], [employer]   2 meetings at this location   Special educational needs teacher         Address  Phone  Notes  [facility] [place],    [place]  1-581-714-4808   [facility]  [address], [place] [address], [place], [place]   [place], [place] [phone] Admissions: 8am-3pm M-F  [facility] Substance [place] [address]    [place], [place] [phone]   [facility] [address] [place], [place], [place]   [facility] [address],  [place], [place]   [facility] - Intensive Outpatient [address], [place], [place]   [facility] [place]  [place], [place] 1-276-765-8484 or  (718)[phone]   [facility] ([facility] [address], [place], [place] Accepts Medicaid  [facility] [address],  [place] 1-765-573-8723 Substance Abuse/Addiction Treatment   [facility] Organization         Address  Phone  Notes  [facility]  782-455-2837   Domenic Schwab, PhD [address] [place], [place]   678-675-4040 or (580) 138-9522   [place], [place] 539-677-8425   [facility] [address], [place], [place] 714-803-8034 Insurance/Medicaid/sponsorship through [facility] Families [address]                                    [place], [place] [phone] [place] [address], [place] (860) [phone]    Dr. Adele Schilder  [phone]   [facility] Dept. 1) [address] [place] 2) [place] 3)  [address], [place] ([phone] [phone]  [phone]   [place] ([phone] or [phone] (After Hours)

## 2014-05-17 NOTE — Discharge Summary (Signed)
Name: JUBEI HERWICK MRN: [id number] DOB: 1961/07/29 53 y.o. PCP: No Pcp Per Patient ______________________________________________________________  Date of Admission: 05/07/2014  3:48 PM Date of Discharge: 05/17/2014 Attending Physician: Aletta Edouard, MD   Discharge Diagnosis: Ischemic colitis Pneumatosis of intestines Syncope Hypotension Abdominal pain Controlled Type II diabetes mellitus with peripheral neuropathy Dyslipidemia Unspecified protein-calorie malnutrition Blood in stool  Discharge Medications:   Medication List    STOP taking these medications       ALPHA LIPOIC ACID PO     lisinopril-hydrochlorothiazide 20-25 MG per tablet  Commonly known as:  PRINZIDE,ZESTORETIC     VITAMIN C PO      TAKE these medications       aspirin 81 MG tablet  Take 81 mg by mouth daily.     ciprofloxacin 500 MG tablet  Commonly known as:  CIPRO  Take 1 tablet (500 mg total) by mouth 2 (two) times daily.     gabapentin 600 MG tablet  Commonly known as:  NEURONTIN  Take 1 tablet (600 mg total) by mouth 3 (three) times daily.     glipiZIDE 5 MG tablet  Commonly known as:  GLUCOTROL  Take 1/2 tablet daily with breakfast     Liraglutide 18 MG/3ML Sopn  Inject 1.2 mg into the skin daily.     metFORMIN 500 MG tablet  Commonly known as:  GLUCOPHAGE  Take 1,000 mg by mouth 2 (two) times daily with a meal.     metroNIDAZOLE 500 MG tablet  Commonly known as:  FLAGYL  Take 1 tablet (500 mg total) by mouth 3 (three) times daily.     pantoprazole 40 MG tablet  Commonly known as:  PROTONIX  Take 1 tablet (40 mg total) by mouth daily.     pravastatin 40 MG tablet  Commonly known as:  PRAVACHOL  Take 40 mg by mouth every morning.     promethazine 25 MG tablet  Commonly known as:  PHENERGAN  Take 1 tablet (25 mg total) by mouth every 8 (eight) hours as needed for nausea or vomiting.     VITAMIN D PO  Take 1 tablet by mouth daily.        Disposition and  follow-up:   Mr.Eder A Ruddick was discharged from [facility] in good condition to home.  Please address the following problems post-discharge:  1. Completion of cipro/flagyl.   2. Resolution of RUQ pain, nausea/vomiting 3. Reassess BP: was not discharged on BP meds d/t hypotension upon admission.  BP remained within goal during entire hospitalization without any anti-hypertensive meds.  He was prescribed prinzide by a NP at his place of employment and has experienced low BP since then but continued to take the medication.   4. Diabetes management: takes 3 meds for DM, 05/08/14 HA1c 6.5.  Sees Dr. Lucianne Muss (endocrinology). Does he wish to continue following with Dr. Lucianne Muss or does he wish to be managed in our clinic?  5. Ensure follow-up with GI: needs colonoscopy, FOBT positive in setting of presumed ischemic colitis. Has appt with Dr. Russella Dar on 7/6.   6. He may need a work excuse although I faxed the FMLA to his employer.   7. Hypomagnesemia: low Mg during hospitalization and repleted.  Please recheck.   Labs / imaging needed at time of follow-up: cbg, BMP, Mg, cbc  Pending labs/ test needing follow-up: None  Follow-up Appointments: Follow-up Information   Follow up with Judie Petit T. Russella Dar, MD On 06/25/2014. (11:00 am)  Specialty:  Gastroenterology   Contact information:   [address] [place] [address] [phone]       Follow up with Juluis Mire, MD On 05/23/2014. (2:15PM)    Specialty:  Internal Medicine   Contact information:   [address] [place] [address] [phone]       Discharge Instructions: Discharge Instructions   Call MD for:  persistant dizziness or light-headedness    Complete by:  As directed      Call MD for:  persistant nausea and vomiting    Complete by:  As directed      Call MD for:  severe uncontrolled pain    Complete by:  As directed      Diet - low sodium heart healthy    Complete by:  As directed      Increase  activity slowly    Complete by:  As directed            Consultations   General Surgery Gastroenterology  Procedures Performed Nm Hepato W/eject Fract  05/11/2014   CLINICAL DATA:  Abdominal pain  EXAM: NUCLEAR MEDICINE HEPATOBILIARY IMAGING WITH GALLBLADDER EF  TECHNIQUE: Sequential images of the abdomen were obtained out to 60 minutes following intravenous administration of radiopharmaceutical. After slow intravenous infusion of 2.5 micrograms Cholecystokinin, gallbladder ejection fraction was determined.  RADIOPHARMACEUTICALS:  5.0 mCiTc-60m Choletec  COMPARISON:  Ultrasound, 05/10/2014  FINDINGS: There is homogeneous accumulation of radiotracer by the liver. Prompt excretion was noted into the intra and extrahepatic biliary tree. Small bowel activity seen early during the study, and gallbladder activity seen later during the exam indicating patency of the common bile duct and cystic duct respectively.  Gallbladder function was then assessed with gallbladder contraction stimulated with a CCK infusion. Gallbladder contraction is within the normal range with an ejection fraction calculated at 44%. At 30 min, normal ejection fraction is greater than 30%.  The patient did not experience symptoms during CCK infusion.  IMPRESSION: Normal hepatobiliary imaging with normal gallbladder function.   Electronically Signed   By: Lajean Manes M.D.   On: 05/11/2014 17:42   US Abdomen Limited  05/10/2014   CLINICAL DATA:  RIGHT upper quadrant abdominal pain, possible gallbladder disease, history hypertension, diabetes, hyperlipidemia  EXAM: US ABDOMEN LIMITED - RIGHT UPPER QUADRANT  COMPARISON:  CTA abdomen 05/07/2014  FINDINGS: Gallbladder:  Normally distended without stones or pericholecystic fluid. Gallbladder wall is upper normal in thickness. Patient demonstrates diffuse tenderness in the mid epigastric region including over the gallbladder.  Common bile duct:  Diameter: 2 mm diameter, normal  Liver:   Grossly normal appearance. Foci of portal venous gas identified within the liver on previous CT are not identified on obtained sonographic images.  No RIGHT upper quadrant free fluid.  IMPRESSION: Upper normal gallbladder wall thickness without definite stones or pericholecystic fluid.  Generalized tenderness in mid epigastric region including over gallbladder, recommend correlation with physical exam.   Electronically Signed   By: Lavonia Dana M.D.   On: 05/10/2014 15:35   Dg Chest Port 1 View  05/07/2014   CLINICAL DATA:  Dizziness, abdominal pain  EXAM: PORTABLE CHEST - 1 VIEW  COMPARISON:  None.  FINDINGS: The heart size and mediastinal contours are within normal limits. Both lungs are clear. The visualized skeletal structures are unremarkable.  IMPRESSION: No active disease.   Electronically Signed   By: Kathreen Devoid   On: 05/07/2014 16:40   Ct Angio Abd/pel W/ And/or W/o  05/12/2014   CLINICAL DATA:  Follow-up ischemic bowel.  EXAM: CTA ABDOMEN AND PELVIS WITHOUT AND WITH CONTRAST  TECHNIQUE: Multidetector CT imaging of the abdomen and pelvis was performed using the standard protocol during bolus administration of intravenous contrast. Multiplanar reconstructed images and MIPs were obtained and reviewed to evaluate the vascular anatomy.  CONTRAST:  126mL OMNIPAQUE IOHEXOL 350 MG/ML SOLN  COMPARISON:  CTA of the abdomen and pelvis performed 05/07/2014  FINDINGS: Right basilar airspace opacification likely reflects atelectasis. A small right pleural effusion is seen.  Previously noted pneumatosis has resolved. There is new inflammation involving the ascending colon, with significant associated mucosal thickening. Intraluminal contents along the ascending colon demonstrate increased attenuation. Would correlate for evidence of rectal blood, though this could simply reflect diluted contrast. Findings raise concern for worsened focal colitis, though underlying changes of ischemia are no longer visible. There  is no evidence of perforation at this time.  Mild soft tissue inflammation tracks along the course of the appendix at the right lower quadrant, and trace free fluid is noted at the lower quadrants. The appendix remains normal in caliber, without evidence for appendicitis.  The liver is unremarkable in appearance; previously noted portal venous gas has resolved. There is mild nodularity of the splenic contour, without significant mass. There is apparent vicarious contrast excretion into the gallbladder; the gallbladder is otherwise unremarkable. The pancreas and adrenal glands are unremarkable.  A few bilateral renal parapelvic cysts are suggested. Mild perinephric stranding and perinephric fluid are seen bilaterally. There is no evidence of hydronephrosis. No renal or ureteral stones are seen.  The small bowel is unremarkable in appearance. The stomach is within normal limits. No acute vascular abnormalities are seen.  The bladder is mildly distended and grossly unremarkable. The prostate remains borderline normal in size. No inguinal lymphadenopathy is seen.  No acute osseous abnormalities are identified.  Review of the MIP images confirms the above findings.  IMPRESSION: 1. New inflammation involving the ascending colon, with significant associated mucosal thickening. Previously noted pneumatosis has resolved. Intraorbital contents along the ascending colon demonstrate increased attenuation; would correlate for evidence of rectal blood, though this could simply reflect diluted contrast. Findings raise concern for worsened focal colitis, though underlying changes of ischemia are no longer visible. No evidence of perforation at this time. 2. Trace free fluid noted at the lower quadrants, with mild associated soft tissue inflammation. 3. Few bilateral renal parapelvic cysts suggested. 4. Right basilar airspace opacification likely reflects atelectasis. Small right pleural effusion seen.   Electronically Signed   By:  Garald Balding M.D.   On: 05/12/2014 01:10   Ct Angio Abd/pel W/ And/or W/o  05/07/2014   CLINICAL DATA:  Severe mid abdominal pain, hypotension, concern for abdominal dissection.  EXAM: CT ANGIOGRAPHY ABDOMEN AND PELVIS WITH CONTRAST AND WITHOUT CONTRAST  TECHNIQUE: Multidetector CT imaging of the abdomen and pelvis was performed using the standard protocol during bolus administration of intravenous contrast. Multiplanar reconstructed images including MIPs were obtained and reviewed to evaluate the vascular anatomy.  CONTRAST:  141mL OMNIPAQUE IOHEXOL 350 MG/ML SOLN  COMPARISON:  None.  FINDINGS: Vascular Findings:  Abdominal aorta: Normal caliber of the abdominal aorta. There is no significant atherosclerotic plaque within the abdominal aorta. No abdominal aortic dissection or periaortic stranding.  Celiac artery: Widely patent without hemodynamically significant narrowing.  SMA: Widely patent without hemodynamically significant narrowing. Incidental note is made of a replaced right hepatic artery which arises from the proximal SMA. Note, evaluation of the  mid/ distal aspect of the main trunk of the SMA is minimally degraded secondary to respiratory artifact. The distal tributaries of the SMA (particularly of the of supplying the right lower abdominal quadrant) are widely patent. There are no discrete filling defects to suggest distal embolism.  Right Renal artery: Solitary; widely patent; the right inferior phrenic artery is noted to arise from the proximal aspect of the right renal artery.  Left Renal artery: Solitary; widely patent without hemodynamically significant narrowing.  IMA: Widely patent  Pelvic vasculature: The bilateral common, external and internal iliac arteries are of normal caliber and widely patent without hemodynamically significant narrowing.  Review of the MIP images confirms the above findings.   --------------------------------------------------------------------------------   Nonvascular Findings:  Pneumatosis is seen within the wall of the cecum (representative axial images 124, 130, 136 and 143; sagittal image 24, series 7). There is air seen within multiple draining mesenteric veins (representative images 80, 81 104, 121 and 122, series 4). These findings are associated with a minimal amount of portal venous gas seen primarily within the nondependent portions of the left lobe of the liver (representative images 47 and 52). The etiology of this pneumatosis is not depicted on this examination. Specifically, there are no discrete filling defects within the SMA tributaries with supplying the cecum. No evidence of volvulus. No pneumoperitoneum. No definable/drainable intra-abdominal abscess. Normal appearance of the appendix.  The bowel is otherwise normal in course and caliber without wall thickening or evidence of obstruction. Normal appearance of the terminal ileum. There is a moderate amount of liquid stool throughout the colon. No evidence of enteric obstruction.  Shotty porta hepatis lymph nodes are individually not enlarged by size criteria with index gastrohepatic lymph node measuring 1.1 cm in greatest short axis diameter (image 67, series 4). No retroperitoneal, mesenteric, pelvic or inguinal lymphadenopathy.  Normal hepatic contour. No discrete hepatic lesions. Normal appearance of the gallbladder given degree distention. No intra extrahepatic biliary dilatation. No ascites.  There is symmetric enhancement of the bilateral kidneys. No discrete renal lesions. No definite renal stones on this postcontrast examination. There is a very minimal amount of grossly symmetric perinephric stranding. No urinary obstruction. Normal appearance of the bilateral adrenal glands, pancreas and spleen.  Scattered calcifications within a normal size prostate gland. Normal appearance of the urinary bladder given degree distention. No free fluid within the pelvis.  No acute or aggressive osseus  abnormalities. Stigmata of DISH within the lower thoracic and upper lumbar spine.  Small mesenteric fat containing periumbilical hernia. There is a peripherally calcified subcutaneous nodule within the pannus of the left lower abdomen, likely a location of fat necrosis. Small mesenteric fat containing inguinal hernias.  IMPRESSION: 1. Pneumatosis involving the cecum with associated air within the draining mesenteric veins and small amount of portal venous gas. The etiology of this pneumatosis is not depicted as examination. Specifically, there is no evidence of abdominal aortic aneurysm or distal embolism affecting the distal tributaries of the SMA supplying the cecum and there is no evidence of cecal volvulus. Further evaluation could be performed with cardiac echo to evaluate for a potential cardiogenic source of a potential resolved distal embolism as clinically indicated. 2. No evidence of abdominal aortic aneurysm or significant atherosclerotic plaque affecting the abdominal aorta.  Critical Value/emergent results were called by telephone at the time of interpretation on 05/07/2014 at 5:02 PM to Dr. Elnora Morrison , who verbally acknowledged these results.   Electronically Signed   By: Eldridge Abrahams.D.  On: 05/07/2014 17:21    2D Echo Date: 05/08/2014 Study Conclusions - Left ventricle: The cavity size was normal. Wall thickness was normal. Systolic function was normal. The estimated ejection fraction was in the range of 55% to 60%. Wall motion was normal; there were no regional wall motion abnormalities. Doppler parameters are consistent with abnormal left ventricular relaxation (grade 1 diastolic dysfunction). - Left atrium: The atrium was mildly dilated. - Right ventricle: The cavity size was mildly dilated. Wall thickness was normal.    Cardiac Cath N/A  Admission HPI Mr. ALASTAIR HENNES is a 53 y.o. male w/ PMHx of HTN, HLD, DM type II, presents to the ED after an episode of syncope and  abdominal pain. Patient claims he started having epigastric pain this afternoon around 2 PM that he describes as a cramping pain, 8/10 in severity. The patient denies any associated nausea, vomiting, fever, chills, diarrhea, hematemesis, melena, or hematochezia. He also denies ever having pain like this before. No recent sick contacts, no previous h/o DVT/PE or vascular issues in the past. Soon after his pain started, the patient claims he went to stand up and felt dizzy, and claims he lost consciousness. He denies any trauma involving the syncopal event. No bowel or bladder incontinence, no tongue biting. The patient admits to good po intake recently, saying his most recent meal was earlier in the day and he had drank pletny of fluids as well. Per EMS notes, patient was hypotensive in the ambulance, systolic in the 06'Y. On arrival to the ED, patient was given 1L NS bolus, systolic returning to the 110's. Patient also received CTA abdomen, showing pneumatosis of the cecum, as well as some mild portal venous gas. No abnormalities of the aorta or larger vessels.  On exam, patient was found to be pale and cool, and noted some worsening abdominal pain, more diffuse in nature, no signs of peritonitis. No bowel sounds heard. The patient denies any chest pain, SOB, diaphoresis, or palpitations.   Hospital Course by problem list: Principal Problem:   Pneumatosis of intestines Active Problems:   Type II or unspecified type diabetes mellitus without mention of complication, uncontrolled   Hyperlipidemia   Diabetic neuropathy   Syncope   Hypotension   Abdominal pain, unspecified site   Unspecified protein-calorie malnutrition   Abdominal pain, other specified site   Ischemic colitis   Diarrhea   Blood in stool   Abdominal pain w/ associated pneumatosis and colitis on CT  Patient p/w epigastric pain while at work followed by episode of syncope.  Pain was cramping in nature, 8/10 in severity. CT abdomen:  pneumatosis involving the cecum with associated air within the draining mesenteric veins and small amount of portal venous gas, but w/ no evidence of AAA or distal embolism affecting the distal tributaries of the SMA supplying the cecum and no evidence of cecal volvulus. Also, no evidence of significant atherosclerotic plaque affecting the abdominal aorta. Patient denies abdominal pain prior to today, no recent illness, nausea, vomiting, fever, chills, diarrhea, hematochezia, or melena. Surgery consulted in the ED, and suggests no need for surgical intervention at this time given no fever, tachycardia, or signs of peritonitis. Lactic acid 1.33 (trended down during hospitalization to 0.7), amylase, wnl, LFTs wnl.  Pt also complained of RUQ pain during hospitalization with Abd U/S and HIDA scan non-revealing.   Pneumatosis most likely 2/2 HoTN in the setting of taking BP meds prescribed by a NP at his place of employment.  Repeat  CT revealed resolution of pneumatosis with worsened colitis. GI: outpatient colonoscopy in several weeks to r/o other causes of colitis. BCx NG Final.  He is to continue cipro and flagyl for a total of 14 days per surgery.    Syncope  Patient admits to dizziness w/ standing at work resulting in episode of syncope. He has never had anything like this before. No bowel or bladder incontinence or tongue biting, seizure unlikely.  He denies any palpitations.  Presented w/ Hb of 12.4, however, baseline unknown.  Per EMS notes, patient w/ HoTN on transport, systolic in the 0000000. Given 1L NS in the ED w/ systolic return to the [id number]. Some question of acute blood loss, however, FOBT negative initially, lactic acid 1.33 on admission. CTA abdomen negative for AAA, rupture, or dissection. Denies poor po intake or recent fatigue or lethargy.  He was admitted to stepdown and started on NS 121ml/hr.  Orthostatics neg.  Trops x 3 neg. EKG non-revealing. Etiology most likely orthostatic hypotension in the  setting of inappropriate anti-hypertensive medications prescribed. Pt works at [employer] and reports that it was very hot and did not feel well. He reports BP sometimes runs low because he feels that he is taking too much BP medication which is prescribed by a NP at his employer. He needs a PCP. Trops x 3 neg. Echo revealed LVEF of 55-60% with grade 1 diastolic dysfunction.  Follow up in [facility].   Nausea/diarrhea with BRBPR  Pt experienced some episodes of BRBPR. FOBT positive in the setting of ischemic colitis. C.diff neg. No h/o hemorrhoids. Hgb stable.  Has follow-up with GI in July. Never had a colonoscopy.  No FH of colon cancer.  Repeat cbc at hospital follow-up.   AKI  Resolved. Pt w/ Cr of 2.06 on admission, baseline unknown. Cr trended down after IVF.  Prinzide was discontinued and can consider restarting BP meds at a much lower dose at some future date if warranted.   h/o HTN  Pt on lisinopril-HCTZ 20-25 at home with hypotensive on admission. During his hospitalization, his BP was within normal limits without any medication.  Advised patient to discontinue any anti-hypertensive medications at this point.  Can consider in the future if warranted but would proceed with caution given history of ischemic colitis in the setting of hypotension and if needed would start at a very low dose and certainly not 123XX123.    Dyslipidemia  On Pravachol 40 mg qhs at home.  Continued at discharge.   DMII w/ neuropathy  Lab Results   Component  Value  Date    HGBA1C  6.5*  05/08/2014   Takes metformin 1000 mg bid, liraglutide, glipizide 2.5 mg qd at home. Sees Dr. Dwyane Dee (endocrinologist).  He was put on SSI upon admission.  Re-evaluate medications upon discharge at hospital follow-up.  He needs a follow-up appointment with Dr. Dwyane Dee if he desires or if he wishes his DM management may be handled through our clinic.  Continued gabapentin.   Hypomagnesemia Pt required supplementation. Please recheck Mg at  hospital f/u.   Deconditioning  PT consulted recommending HH PT and rolling walker.  Ordered upon discharge.   Discharge Vitals:   BP 120/72  Pulse 78  Temp(Src) 97.7 F (36.5 C) (Oral)  Resp 16  Ht 6\' 1"  (1.854 m)  Wt 275 lb 12.7 oz (125.1 kg)  BMI 36.39 kg/m2  SpO2 100%  Discharge Labs:  Results for orders placed during the hospital encounter of 05/07/14 (from the  past 24 hour(s))  GLUCOSE, CAPILLARY     Status: Abnormal   Collection Time    05/16/14  4:30 PM      Result Value Ref Range   Glucose-Capillary 138 (*) 70 - 99 mg/dL  GLUCOSE, CAPILLARY     Status: Abnormal   Collection Time    05/16/14  9:17 PM      Result Value Ref Range   Glucose-Capillary 199 (*) 70 - 99 mg/dL  GLUCOSE, CAPILLARY     Status: Abnormal   Collection Time    05/17/14 12:13 AM      Result Value Ref Range   Glucose-Capillary 175 (*) 70 - 99 mg/dL  MAGNESIUM     Status: Abnormal   Collection Time    05/17/14  3:35 AM      Result Value Ref Range   Magnesium 1.3 (*) 1.5 - 2.5 mg/dL  GLUCOSE, CAPILLARY     Status: Abnormal   Collection Time    05/17/14  5:47 AM      Result Value Ref Range   Glucose-Capillary 142 (*) 70 - 99 mg/dL  GLUCOSE, CAPILLARY     Status: Abnormal   Collection Time    05/17/14 11:09 AM      Result Value Ref Range   Glucose-Capillary 237 (*) 70 - 99 mg/dL   Comment 1 Notify RN     Comment 2 Documented in Chart      Signed: Jones Bales, MD 05/17/2014, 9:02 AM   Time Spent on Discharge: 45 minutes Services Ordered on Discharge: [place] on Discharge: Rolling walker

## 2014-05-17 NOTE — Progress Notes (Signed)
Subjective:   Day of hospitalization: 10  VSS; BP 120/72.  Still on abx per surgery (day 10). HIDA scan neg.  lactic acid wnl.  CT shows colitis with resolution of pneumotosis.  Magnesium low this AM at 1.3.  FOBT pos, c.diff neg.  He has no more abdominal pain and has minimal nausea.    Objective:   Vital signs in last 24 hours: Filed Vitals:   05/16/14 0426 05/16/14 1400 05/16/14 2036 05/17/14 0551  BP: 126/86 134/84 119/68 120/72  Pulse: 74 80 72 78  Temp: 97.6 F (36.4 C) 97.6 F (36.4 C) 98 F (36.7 C) 97.7 F (36.5 C)  TempSrc: Oral Oral Oral Oral  Resp: 18 18 16 16   Height:      Weight:      SpO2: 97% 98% 100% 100%    Weight: Filed Weights   05/07/14 1700 05/08/14 0020  Weight: 278 lb 10.6 oz (126.4 kg) 275 lb 12.7 oz (125.1 kg)    Ins/Outs:  Intake/Output Summary (Last 24 hours) at 05/17/14 1341 Last data filed at 05/17/14 1248  Gross per 24 hour  Intake   1080 ml  Output      0 ml  Net   1080 ml   -3.1 since admission.   Physical Exam: Constitutional: Vital signs reviewed.  Patient sitting on the bed eating breakfast in no acute distress and cooperative with exam.   HEENT: [place]/AT; PERRL, EOMI, conjunctivae normal, no scleral icterus  Cardiovascular: RRR, no MRG Pulmonary/Chest: normal respiratory effort, no accessory muscle use, CTAB, no wheezes, rales, or rhonchi Abdominal: Obese. Soft. +bs, non-tender, non-distended; no guarding or rebound Neurological: A&O x3, CN II-XII grossly intact; non-focal exam Extremities: 2+DP b/l, no C/C/E  Skin: Warm, dry and intact. No rash  Lab Results:  BMP:  Recent Labs Lab 05/12/14 0310 05/13/14 0055  05/15/14 0435 05/16/14 0355 05/17/14 0335  NA 139 137  < > 138 139  --   K 3.2* 3.9  < > 4.7 3.8  --   CL 100 97  < > 100 100  --   CO2 28 26  < > 25 27  --   GLUCOSE 141* 128*  < > 136* 149*  --   BUN 8 7  < > 6 10  --   CREATININE 0.95 0.91  < > 0.94 1.02  --   CALCIUM 8.5 9.5  < > 9.7 9.5  --   MG  1.0* 1.6  < > 1.2* 1.5 1.3*  PHOS  --  3.5  --   --   --   --   < > = values in this interval not displayed. Anion Gap:  12  CBC:  Recent Labs Lab 05/15/14 0435 05/16/14 0355  WBC 5.7 7.6  HGB 12.8* 12.6*  HCT 38.5* 37.5*  MCV 88.1 87.8  PLT 273 311    Coagulation: No results found for this basename: LABPROT, INR,  in the last 168 hours  CBG:            Recent Labs Lab 05/16/14 1134 05/16/14 1630 05/16/14 2117 05/17/14 0013 05/17/14 0547 05/17/14 1109  GLUCAP 221* 138* 199* 175* 142* 237*           HA1C:      No results found for this basename: HGBA1C,  in the last 168 hours LFTs: No results found for this basename: AST, ALT, ALKPHOS, BILITOT, PROT, ALBUMIN,  in the last 168 hours  Pancreatic Enzymes: No results  found for this basename: LIPASE, AMYLASE,  in the last 168 hours  Lactic Acid/Procalcitonin:  Recent Labs Lab 05/12/14 0310  LATICACIDVEN 0.7    Ammonia: No results found for this basename: AMMONIA,  in the last 168 hours  Cardiac Enzymes: No results found for this basename: CKTOTAL, CKMB, CKMBINDEX, TROPONINI,  in the last 168 hours  EKG: EKG Interpretation  Date/Time:  Monday May 07 2014 15:59:33 EDT Ventricular Rate:  82 PR Interval:  177 QRS Duration: 101 QT Interval:  367 QTC Calculation: 429 R Axis:   61 Text Interpretation:  Sinus rhythm RSR' in V1 or V2, right VCD or RVH Abnormal inferior Q waves Borderline ST elevation, inferior leads Confirmed by ZAVITZ  MD, JOSHUA ([id number]) on 05/07/2014 4:12:03 PM   BNP: No results found for this basename: PROBNP,  in the last 168 hours  D-Dimer: No results found for this basename: DDIMER,  in the last 168 hours  Urinalysis: No results found for this basename: COLORURINE, APPERANCEUR, LABSPEC, PHURINE, GLUCOSEU, HGBUR, BILIRUBINUR, KETONESUR, PROTEINUR, UROBILINOGEN, NITRITE, LEUKOCYTESUR,  in the last 168 hours  Micro Results: Recent Results (from the past 240 hour(s))  CULTURE, BLOOD  (ROUTINE X 2)     Status: None   Collection Time    05/07/14  7:26 PM      Result Value Ref Range Status   Specimen Description BLOOD HAND RIGHT   Final   Special Requests BOTTLES DRAWN AEROBIC ONLY 2CC   Final   Culture  Setup Time     Final   Value: 05/08/2014 00:40     Performed at [facility]   Culture     Final   Value: NO GROWTH 5 DAYS     Performed at [facility]   Report Status 05/14/2014 FINAL   Final  CULTURE, BLOOD (ROUTINE X 2)     Status: None   Collection Time    05/07/14  7:30 PM      Result Value Ref Range Status   Specimen Description BLOOD ARM LEFT   Final   Special Requests BOTTLES DRAWN AEROBIC AND ANAEROBIC [facility]   Final   Culture  Setup Time     Final   Value: 05/08/2014 00:41     Performed at [facility]   Culture     Final   Value: NO GROWTH 5 DAYS     Performed at [facility]   Report Status 05/14/2014 FINAL   Final  MRSA PCR SCREENING     Status: None   Collection Time    05/08/14 12:10 AM      Result Value Ref Range Status   MRSA by PCR NEGATIVE  NEGATIVE Final   Comment:            The GeneXpert MRSA Assay (FDA     approved for NASAL specimens     only), is one component of a     comprehensive MRSA colonization     surveillance program. It is not     intended to diagnose MRSA     infection nor to guide or     monitor treatment for     MRSA infections.  CLOSTRIDIUM DIFFICILE BY PCR     Status: None   Collection Time    05/14/14  3:12 PM      Result Value Ref Range Status   C difficile by pcr NEGATIVE  NEGATIVE Final    Blood Culture:    Component Value Date/Time  SDES BLOOD ARM LEFT 05/07/2014 1930    Studies/Results: No results found.  Medications:  Scheduled Meds: . ciprofloxacin  500 mg Oral BID  . gabapentin  600 mg Oral TID  . insulin aspart  0-9 Units Subcutaneous 4 times per day  . magnesium sulfate 1 - 4 g bolus IVPB  2 g Intravenous Once  . metroNIDAZOLE  500 mg Oral 3 times per day    . pantoprazole  40 mg Oral Daily   Continuous Infusions:   PRN Meds: ondansetron, oxyCODONE-acetaminophen, promethazine  Antibiotics: Antibiotics Given (last 72 hours)   Date/Time Action Medication Dose Rate   05/14/14 2229 Given   ciprofloxacin (CIPRO) IVPB 400 mg 400 mg 200 mL/hr   05/15/14 1050 Given   ciprofloxacin (CIPRO) tablet 500 mg 500 mg    05/15/14 1652 Given   metroNIDAZOLE (FLAGYL) tablet 500 mg 500 mg    05/15/14 2139 Given   ciprofloxacin (CIPRO) tablet 500 mg 500 mg    05/15/14 2139 Given   metroNIDAZOLE (FLAGYL) tablet 500 mg 500 mg    05/16/14 [id number] Given   metroNIDAZOLE (FLAGYL) tablet 500 mg 500 mg    05/16/14 1050 Given   ciprofloxacin (CIPRO) tablet 500 mg 500 mg    05/16/14 1240 Given   metroNIDAZOLE (FLAGYL) tablet 500 mg 500 mg    05/16/14 2017 Given   ciprofloxacin (CIPRO) tablet 500 mg 500 mg    05/16/14 2154 Given   metroNIDAZOLE (FLAGYL) tablet 500 mg 500 mg    05/17/14 [id number] Given   ciprofloxacin (CIPRO) tablet 500 mg 500 mg    05/17/14 0804 Given   metroNIDAZOLE (FLAGYL) tablet 500 mg 500 mg       Day of Hospitalization: 10  Consults:  Surgery  Assessment/Plan:   Principal Problem:   Pneumatosis of intestines Active Problems:   Type II or unspecified type diabetes mellitus without mention of complication, uncontrolled   Hyperlipidemia   Diabetic neuropathy   Syncope   Hypotension   Abdominal pain, unspecified site   Unspecified protein-calorie malnutrition   Abdominal pain, other specified site   Ischemic colitis   Diarrhea   Blood in stool  Diarrhea and nausea with BRBPR Improved.  No more episodes of BRBPR. FOBT pos in setting of ischemic colitis.  C.diff neg. No h/o hemorrhoids.  Hgb stable. -d/c home today  RUQ abdominal pain w/ associated colitis on CT Pain improved.  Nausea with meatloaf.  Non-tender on exam.  No vomiting.  Pt admitted w/ epigastric pain followed by episode of syncope. Surgery consulted not recommend  surgical intervention.  Lactic acid wnl therefore ischemic bowel unlikely-no peritoneal signs.  Pneumatosis has resolved.  Amylase, lactic acid, LFTs wnl.  RUQ U/S and HIDA scan non-revealing.  Repeat CT revealed resolution of pneumatosis with worsened colitis.  GI: outpatient colonoscopy in several weeks to r/o other causes of colitis.  BCx NG Final.  -continue cipro and flagyl changed to po for total of 14 days (day 10 of cipro/day 11 of flagyl) -I/O's  -protonix 40mg  po  -phenergan q6h prn po -low fiber diet -surgery signed off, GI following appreciate recs -stable for d/c today  Syncope VSS.  No further episodes.  Etiology most likely orthostatic hypotension.  Pt works at [employer] and reports that it was very hot and did not feel well.  He reports BP sometimes runs low because he feels that he is taking too much BP medication which is prescribed by a NP at his employer.  He  needs a PCP. Trops x 3 neg.  Echo revealed LVEF of 55-60% with grade 1 diastolic dysfunction.  -PT consulted recommends [facility] PT with rolling walker   AKI Resolved.  Pt w/ Cr of 2.06 on admission, baseline unknown.  Cr trended down after IVF.   -d/c home prinzide   h/o HTN Pt on lisinopril-HCTZ 20-25 at home with hypotensive on admission.  Would not give BP meds at this point. -d/c home meds  Dyslipidemia On Pravachol 40 mg qhs at home.  -hold Pravastatin for now  DMII w/ neuropathy Lab Results  Component Value Date   HGBA1C 6.5* 05/08/2014  Takes metformin 1000 mg bid, liraglutide, glipizide 2.5 mg qd at home.  Sees Dr. Dwyane Dee (endocrinologist).  -Hold home meds  -SSI-S -cbgs  -continue neurontin   Deconditioning PT consulted recommending HH PT and rolling walker.    VTE PPx SCD  Disposition Anticipated discharge in approximately today with follow up in [place].     LOS: 10 days   Jones Bales, MD PGY-1, Internal Medicine Teaching Service [phone] (7AM-5PM Mon-Fri) 05/17/2014, 1:41  PM

## 2014-05-21 NOTE — Discharge Summary (Signed)
Reviewed. Agree with documentation. 

## 2014-05-23 ENCOUNTER — Ambulatory Visit (INDEPENDENT_AMBULATORY_CARE_PROVIDER_SITE_OTHER): Payer: BC Managed Care – PPO | Admitting: Internal Medicine

## 2014-05-23 ENCOUNTER — Ambulatory Visit: Payer: BC Managed Care – PPO | Admitting: Endocrinology

## 2014-05-23 VITALS — BP 108/81 | HR 107 | Temp 100.1°F | Resp 20 | Ht 72.0 in | Wt 266.1 lb

## 2014-05-23 DIAGNOSIS — IMO0001 Reserved for inherently not codable concepts without codable children: Secondary | ICD-10-CM

## 2014-05-23 DIAGNOSIS — E1165 Type 2 diabetes mellitus with hyperglycemia: Secondary | ICD-10-CM

## 2014-05-23 DIAGNOSIS — K6389 Other specified diseases of intestine: Secondary | ICD-10-CM

## 2014-05-23 DIAGNOSIS — I959 Hypotension, unspecified: Secondary | ICD-10-CM

## 2014-05-23 LAB — CBC
HEMATOCRIT: 42.6 % (ref 39.0–52.0)
Hemoglobin: 14.2 g/dL (ref 13.0–17.0)
MCH: 29 pg (ref 26.0–34.0)
MCHC: 33.3 g/dL (ref 30.0–36.0)
MCV: 87.1 fL (ref 78.0–100.0)
Platelets: 428 10*3/uL — ABNORMAL HIGH (ref 150–400)
RBC: 4.89 MIL/uL (ref 4.22–5.81)
RDW: 13 % (ref 11.5–15.5)
WBC: 6.9 10*3/uL (ref 4.0–10.5)

## 2014-05-23 LAB — BASIC METABOLIC PANEL WITH GFR
BUN: 16 mg/dL (ref 6–23)
CHLORIDE: 101 meq/L (ref 96–112)
CO2: 25 mEq/L (ref 19–32)
Calcium: 9.7 mg/dL (ref 8.4–10.5)
Creat: 1.03 mg/dL (ref 0.50–1.35)
GFR, EST NON AFRICAN AMERICAN: 83 mL/min
Glucose, Bld: 105 mg/dL — ABNORMAL HIGH (ref 70–99)
POTASSIUM: 4.8 meq/L (ref 3.5–5.3)
Sodium: 140 mEq/L (ref 135–145)

## 2014-05-23 LAB — MAGNESIUM: MAGNESIUM: 1.2 mg/dL — AB (ref 1.5–2.5)

## 2014-05-23 MED ORDER — MAGNESIUM OXIDE -MG SUPPLEMENT 400 (240 MG) MG PO TABS: 800.0000 mg | ORAL_TABLET | Freq: Every day | ORAL | Status: DC

## 2014-05-23 MED ORDER — PROMETHAZINE HCL 25 MG PO TABS: 25.0000 mg | ORAL_TABLET | Freq: Three times a day (TID) | ORAL | Status: DC | PRN

## 2014-05-23 NOTE — Assessment & Plan Note (Addendum)
He had some hypomagnesemia during his admission, we'll check this today.  ADDENDUM: Magnesium is low at 1.2. I prescribed Mag-Ox 400, 2 tabs daily with food (recommended dosing per UpToDate). Called the patient's phone number, no pick up, left a message for him to call the clinic tomorrow. This will need to be checked again at his return visit next week.  Magnesium  Date Value Ref Range Status  05/23/2014 1.2* 1.5 - 2.5 mg/dL Final  05/17/2014 1.3* 1.5 - 2.5 mg/dL Final  05/16/2014 1.5  1.5 - 2.5 mg/dL Final  05/15/2014 1.2* 1.5 - 2.5 mg/dL Final  05/14/2014 1.4* 1.5 - 2.5 mg/dL Final

## 2014-05-23 NOTE — Addendum Note (Signed)
Addended by: Latoiya Maradiaga, Wynelle Bourgeois on: 05/23/2014 05:34 PM   Modules accepted: Orders

## 2014-05-23 NOTE — Assessment & Plan Note (Signed)
Blood pressure within a normal range here. Orthostatics were performed and negative. - Continue to hold all blood pressure medication

## 2014-05-23 NOTE — Assessment & Plan Note (Addendum)
Patient continues to have abdominal pain, nausea, diarrhea. He has a low-grade fever and mild tachycardia, 90s to 100s. He is status post a 14 day course of Cipro and Flagyl. His physical exam shows a nontender, nondistended belly with normal bowel sounds. Dr. Ellwood Dense, who was the attending physician during the patient's hospitalization, saw him with me today. He appears overall improved from admission, but the persistent nature of his symptoms remains puzzling and concerning.   As his followup with Dr. Fuller Plan is not until July, I called the [place] office for advice and to see if the patient can be seen sooner. I spoke with Dr. Lynne Leader nurse, who made the patient an appointment for tomorrow at 2:30 PM.   - Patient was given appointment information (address, phone number) - Phenergan was refilled - Checking BMP, CBC - Followup in 1 week on 05/30/2014 - Work note provided, dated to this appointment - Recommend HIV test at followup visit as pneumatosis and colitis can be a presenting sign, and this was not checked in the hospital  ADDENDUM: Labs below, unremarkable. Platelets are likely an acute phase reactant. Recommend HIV test and repeat magnesium at follow up visit next week.  BMET    Component Value Date/Time   NA 140 05/23/2014 1508   K 4.8 05/23/2014 1508   CL 101 05/23/2014 1508   CO2 25 05/23/2014 1508   GLUCOSE 105* 05/23/2014 1508   BUN 16 05/23/2014 1508   CREATININE 1.03 05/23/2014 1508   CREATININE 1.02 05/16/2014 0355   CALCIUM 9.7 05/23/2014 1508   GFRNONAA 83 05/23/2014 1508   GFRNONAA 82* 05/16/2014 0355   GFRAA >89 05/23/2014 1508   GFRAA >90 05/16/2014 0355    CBC    Component Value Date/Time   WBC 6.9 05/23/2014 1508   RBC 4.89 05/23/2014 1508   HGB 14.2 05/23/2014 1508   HCT 42.6 05/23/2014 1508   PLT 428* 05/23/2014 1508   MCV 87.1 05/23/2014 1508   MCH 29.0 05/23/2014 1508   MCHC 33.3 05/23/2014 1508   RDW 13.0 05/23/2014 1508   LYMPHSABS 1.9 05/10/2014 0325   MONOABS 0.8 05/10/2014 0325   EOSABS 0.4 05/10/2014 0325   BASOSABS 0.0 05/10/2014 0325

## 2014-05-23 NOTE — Assessment & Plan Note (Signed)
Well controlled, last A1c 6.5 on 05/08/14. Glucometer report here shows glucose average 125, one low of 58, one high of 212, the rest within the target range.  He follows with Dr. Dwyane Dee of endocrinology. For now, he would like to establish with our office for primary care and continue to see Dr. Dwyane Dee for his diabetes.

## 2014-05-23 NOTE — Patient Instructions (Signed)
Thank you for your visit. - Please followup with Alonza Bogus at 2:30 PM tomorrow at the following location, she is a Librarian, academic who works with Dr. Fuller Plan in the gastroenterology Department:  [facility]. [place], [address]: [phone]  - Please continue to take Phenergan as needed for nausea. - We are checking some basic labs today, and I will call you if they're abnormal. - Please return to the clinic next week for reevaluation. I provided you with a work note until that time.

## 2014-05-23 NOTE — Progress Notes (Signed)
Subjective:    Patient ID: Jack Wallace, male    DOB: 1961-07-08, 53 y.o.   MRN: [id number]  HPI  Jack Wallace is a 53 y.o. male w/ PMHx of HTN, HLD, DM type II, who presents for hospital follow up. He is new to the clinic.  Patient was admitted 05/18 - 05/17/14 for abdominal pain, found to have associated pneumatosis and colitis on CT scan. Surgery was consulted and suggested no need for surgical intervention given no fever, tachycardia, or signs of peritonitis. His lactic acid was 1.33 on admission, trending down to normal during hospitalization. Abdominal ultrasound and HIDA scan were performed due to contiued RUQ pain, and nonrevealing. Repeat CT revealed resolution of pneumatosis with worsening colitis. GI was consulted and recommended an outpatient colonoscopy after resolution of acute symptoms. He was discharged on a 14 day course of Cipro and Flagyl. Suspected etiology of his pneumatosis was hypotension in the setting of taking new blood pressure medications prescribed by a nurse practitioner at his place of employment.  Patient reports since hospital discharge, he still has abdominal pain. It is less frequent, but still there. He is also having significant, constant nausea, without vomiting, and the Phenergen is not helping very much. He has some anorexia (skipping dinner) due to the nausea, but is trying to drink as much water and Gatorade as possible to avoid dehydration. He also reports 1-2 episodes of non-bloody diarrhea per day, starting 5 days ago. He finished his course of Cipro and Flagyl. He has a GI followup appointment with Dr. Fuller Plan on 06/25/14.  He works as an Agricultural consultant at [employer], and requests a work note as he does not feel well enough to work right now.   Current Outpatient Prescriptions on File Prior to Visit  Medication Sig Dispense Refill  . aspirin 81 MG tablet Take 81 mg by mouth daily.      . Cholecalciferol (VITAMIN D PO) Take 1 tablet by mouth  daily.      . ciprofloxacin (CIPRO) 500 MG tablet Take 1 tablet (500 mg total) by mouth 2 (two) times daily.  8 tablet  0  . gabapentin (NEURONTIN) 600 MG tablet Take 1 tablet (600 mg total) by mouth 3 (three) times daily.  90 tablet  3  . glipiZIDE (GLUCOTROL) 5 MG tablet Take 1/2 tablet daily with breakfast  15 tablet  5  . Liraglutide 18 MG/3ML SOPN Inject 1.2 mg into the skin daily.  2 pen  5  . metFORMIN (GLUCOPHAGE) 500 MG tablet Take 1,000 mg by mouth 2 (two) times daily with a meal.      . metroNIDAZOLE (FLAGYL) 500 MG tablet Take 1 tablet (500 mg total) by mouth 3 (three) times daily.  12 tablet  0  . pantoprazole (PROTONIX) 40 MG tablet Take 1 tablet (40 mg total) by mouth daily.  30 tablet  0  . pravastatin (PRAVACHOL) 40 MG tablet Take 40 mg by mouth every morning.       . promethazine (PHENERGAN) 25 MG tablet Take 1 tablet (25 mg total) by mouth every 8 (eight) hours as needed for nausea or vomiting.  9 tablet  0    Review of Systems  Constitutional: Positive for appetite change and fatigue. Negative for fever and chills.  Respiratory: Negative for cough and shortness of breath.   Cardiovascular: Negative for chest pain.  Gastrointestinal: Positive for nausea, abdominal pain and diarrhea. Negative for vomiting, blood in stool and rectal pain.  Objective:   Physical Exam  Constitutional: He is oriented to person, place, and time. He appears well-developed and well-nourished.  Eyes: Conjunctivae and EOM are normal. Pupils are equal, round, and reactive to light.  Neck: Normal range of motion. Neck supple.  Cardiovascular: Normal rate and regular rhythm.   No murmur heard. Pulmonary/Chest: Effort normal and breath sounds normal.  Abdominal: Soft. Bowel sounds are normal. He exhibits no distension and no mass. There is no tenderness. There is no rebound and no guarding.  Musculoskeletal: Normal range of motion. He exhibits no edema and no tenderness.  Neurological: He is  alert and oriented to person, place, and time. No cranial nerve deficit.  Skin: Skin is warm and dry.  Psychiatric: He has a normal mood and affect.    Filed Vitals:   05/23/14 1455  BP: 108/81  Pulse: 107  Temp: 100.1 F (37.8 C)  Resp: 20   Orthostatics checked and normal.    Assessment & Plan:   Please see problem based charting.

## 2014-05-24 ENCOUNTER — Ambulatory Visit (INDEPENDENT_AMBULATORY_CARE_PROVIDER_SITE_OTHER): Payer: BC Managed Care – PPO | Admitting: Gastroenterology

## 2014-05-24 ENCOUNTER — Encounter: Payer: Self-pay | Admitting: Gastroenterology

## 2014-05-24 VITALS — BP 126/90 | HR 100 | Temp 98.6°F | Ht 73.0 in | Wt 265.2 lb

## 2014-05-24 DIAGNOSIS — K529 Noninfective gastroenteritis and colitis, unspecified: Secondary | ICD-10-CM

## 2014-05-24 DIAGNOSIS — K5289 Other specified noninfective gastroenteritis and colitis: Secondary | ICD-10-CM

## 2014-05-24 DIAGNOSIS — R109 Unspecified abdominal pain: Secondary | ICD-10-CM

## 2014-05-24 MED ORDER — MOVIPREP 100 G PO SOLR: 1.0000 | Freq: Once | ORAL | Status: DC

## 2014-05-24 NOTE — Progress Notes (Signed)
     05/24/2014 Jack Wallace [id number] January 05, 1961   History of Present Illness: This is a 53 year old male who was admitted at [facility] hospital 05/18 - 05/17/14 for abdominal pain, found to have associated pneumatosis and colitis on CT scan. Surgery was consulted and suggested no need for surgical intervention given no fever, tachycardia, or signs of peritonitis. His lactic acid was 1.33 on admission, trending down to normal during hospitalization. Abdominal ultrasound and HIDA scan were performed due to contiued RUQ pain, and nonrevealing. Repeat CT revealed resolution of pneumatosis with worsening colitis. GI was consulted and we recommended an outpatient colonoscopy after resolution of acute symptoms. He was discharged on a 14 day course of Cipro and Flagyl after receiving IV antibiotics while inpatient. Suspected etiology of his pneumatosis was hypotension in the setting of taking new blood pressure medications prescribed by a nurse practitioner at his place of employment.   Patient reports since hospital discharge, he still has abdominal pain. It is less frequent and less severe, but still present and has improved further since his he was in the hospital. He is also having significant, constant nausea, without vomiting, and he has been taking Phenergan for that. He has some anorexia (skipping dinner) due to the nausea, but is trying to drink as much water and Gatorade as possible to avoid dehydration. He also reports 1-2 episodes of non-bloody diarrhea per day. He finished his course of Cipro and Flagyl three days ago.  He has been sent back here sooner than initially planned due to ongoing symptoms.  Had a low-grade fever of 100.1 degrees at PCP office yesterday, but temp is normal today.  Says that he does not feel feverish necessarily at home.   Current Medications, Allergies, Past Medical History, Past Surgical History, Family History and Social History were reviewed in [employer] record.   Physical Exam: BP 126/90  Pulse 100  Temp(Src) 98.6 F (37 C)  Ht 6\' 1"  (1.854 m)  Wt 265 lb 3.2 oz (120.294 kg)  BMI 35.00 kg/m2 General: Well developed white male in no acute distress Head: Normocephalic and atraumatic Eyes:  Sclerae anicteric, conjunctiva pink  Ears: Normal auditory acuity Lungs: Clear throughout to auscultation Heart: Regular rate and rhythm Abdomen: Soft, non-distended.  Normal bowel sounds.  Minimal right sided TTP.  Abdominal exam benign. Rectal:  Deferred.  Will be done at the time of colonoscopy.   Musculoskeletal: Symmetrical with no gross deformities  Extremities: No edema  Neurological: Alert oriented x 4, grossly non-focal Psychological:  Alert and cooperative. Normal mood and affect  Assessment and Recommendations: -Acute ischemic event to right colon, thought to be due to hypotension/dehydration from a new blood pressure medication.  Still with complaints of abdominal pain and nausea.  I discussed with Dr. Carlean Purl who also saw and examined the patient and we agreed to tentatively schedule colonoscopy for further evaluation.  Continue phenergan prn as well.  The risks, benefits, and alternatives of the procedure were discussed with the patient and he consents to proceed.

## 2014-05-24 NOTE — Progress Notes (Signed)
Reviewed and agree with management plan.  Malcolm T. Stark, MD FACG 

## 2014-05-24 NOTE — Patient Instructions (Signed)

## 2014-05-25 ENCOUNTER — Telehealth: Payer: Self-pay | Admitting: *Deleted

## 2014-05-25 ENCOUNTER — Encounter: Payer: Self-pay | Admitting: Gastroenterology

## 2014-05-25 NOTE — Telephone Encounter (Signed)
Work note per Alonza Bogus, PA-C for next Thursday and Friday. Called patient at home number, not sure if he wants to pick work note up or have me fax it to his employer(will need secure fax at employers office) [facility] for call back.

## 2014-05-25 NOTE — Telephone Encounter (Signed)
Talked with pt - started on Mag-Ox tabs and aware Magnesium level was low. Saw Dr Fuller Plan 05/24/14 and sch for colonoscopy next week. Hilda Blades Cavon Nicolls RN 05/25/14 9AM

## 2014-05-29 ENCOUNTER — Telehealth: Payer: Self-pay | Admitting: Gastroenterology

## 2014-05-29 NOTE — Telephone Encounter (Signed)
Left a message for patient to return my call. 

## 2014-05-29 NOTE — Telephone Encounter (Signed)
Patient wanted to make sure he could take all his other medications the day of the procedure. Told patient he can.

## 2014-05-30 ENCOUNTER — Ambulatory Visit (INDEPENDENT_AMBULATORY_CARE_PROVIDER_SITE_OTHER): Payer: BC Managed Care – PPO | Admitting: Internal Medicine

## 2014-05-30 ENCOUNTER — Encounter: Payer: Self-pay | Admitting: Internal Medicine

## 2014-05-30 VITALS — BP 106/76 | HR 105 | Temp 98.9°F | Resp 20 | Ht 72.5 in | Wt 265.3 lb

## 2014-05-30 DIAGNOSIS — I10 Essential (primary) hypertension: Secondary | ICD-10-CM

## 2014-05-30 DIAGNOSIS — K6389 Other specified diseases of intestine: Secondary | ICD-10-CM

## 2014-05-30 DIAGNOSIS — E1165 Type 2 diabetes mellitus with hyperglycemia: Secondary | ICD-10-CM

## 2014-05-30 DIAGNOSIS — R55 Syncope and collapse: Secondary | ICD-10-CM

## 2014-05-30 DIAGNOSIS — I959 Hypotension, unspecified: Secondary | ICD-10-CM

## 2014-05-30 DIAGNOSIS — E876 Hypokalemia: Secondary | ICD-10-CM

## 2014-05-30 DIAGNOSIS — IMO0001 Reserved for inherently not codable concepts without codable children: Secondary | ICD-10-CM

## 2014-05-30 DIAGNOSIS — E119 Type 2 diabetes mellitus without complications: Secondary | ICD-10-CM

## 2014-05-30 LAB — CBC WITH DIFFERENTIAL/PLATELET
BASOS ABS: 0 10*3/uL (ref 0.0–0.1)
Basophils Relative: 0 % (ref 0–1)
Eosinophils Absolute: 0.1 10*3/uL (ref 0.0–0.7)
Eosinophils Relative: 2 % (ref 0–5)
HCT: 40.1 % (ref 39.0–52.0)
Hemoglobin: 13.7 g/dL (ref 13.0–17.0)
LYMPHS ABS: 3.1 10*3/uL (ref 0.7–4.0)
LYMPHS PCT: 42 % (ref 12–46)
MCH: 28.5 pg (ref 26.0–34.0)
MCHC: 34.2 g/dL (ref 30.0–36.0)
MCV: 83.4 fL (ref 78.0–100.0)
Monocytes Absolute: 0.6 10*3/uL (ref 0.1–1.0)
Monocytes Relative: 8 % (ref 3–12)
NEUTROS ABS: 3.6 10*3/uL (ref 1.7–7.7)
Neutrophils Relative %: 48 % (ref 43–77)
PLATELETS: 319 10*3/uL (ref 150–400)
RBC: 4.81 MIL/uL (ref 4.22–5.81)
RDW: 13.6 % (ref 11.5–15.5)
WBC: 7.4 10*3/uL (ref 4.0–10.5)

## 2014-05-30 NOTE — Patient Instructions (Addendum)
General Instructions: Dear Mr. Stofko,  Please go for your colonoscopy as scheduled on the 12th and follow the prep instructions for a good study  Please follow up with Dr. Dwyane Dee as soon as possible for your diabetes  If your symptoms get worse or do not improve, please let us know as soon as possible.   Please be careful with your dizziness, take your time when standing from seated position, keep up your hydration.   We have stopped your phenergan, but if you need something else for nausea that continues, please let us know.   Please bring your medicines with you each time you come to clinic.  Medicines may include prescription medications, over-the-counter medications, herbal remedies, eye drops, vitamins, or other pills.  Progress Toward Treatment Goals:  Treatment Goal 05/30/2014  Hemoglobin A1C at goal  Blood pressure at goal    Self Care Goals & Plans:  No flowsheet data found.  Home Blood Glucose Monitoring 05/30/2014  When to check my blood sugar before meals    Care Management & Community Referrals:  No flowsheet data found.   Dizziness  Dizziness means you feel unsteady or lightheaded. You might feel like you are going to pass out (faint). HOME CARE   Drink enough fluids to keep your pee (urine) clear or pale yellow.  Take your medicines exactly as told by your doctor. If you take blood pressure medicine, always stand up slowly from the lying or sitting position. Hold on to something to steady yourself.  If you need to stand in one place for a long time, move your legs often. Tighten and relax your leg muscles.  Have someone stay with you until you feel okay.  Do not drive or use heavy machinery if you feel dizzy.  Do not drink alcohol. GET HELP RIGHT AWAY IF:   You feel dizzy or lightheaded and it gets worse.  You feel sick to your stomach (nauseous), or you throw up (vomit).  You have trouble talking or walking.  You feel weak or have trouble using  your arms, hands, or legs.  You cannot think clearly or have trouble forming sentences.  You have chest pain, belly (abdominal) pain, sweating, or you are short of breath.  Your vision changes.  You are bleeding.  You have problems from your medicine that seem to be getting worse. MAKE SURE YOU:   Understand these instructions.  Will watch your condition.  Will get help right away if you are not doing well or get worse. Document Released: 11/26/2011 Document Revised: 02/29/2012 Document Reviewed: 11/26/2011 [facility] Patient Information 2014 [place], [place].

## 2014-05-30 NOTE — Progress Notes (Signed)
Subjective:   [name]   DOB: 1961/06/30 53 y.o.   MRN: [id number]  HPI: [name] is a 53 y.o. male with DM2, HTN, HL, and recent visits for pneumatosis of intestines (followed by Dr. Fuller Plan with GI) presenting to opc today for routine follow up visit from his last opc visit with Dr. Lucila Wallace last week.  Since that visit and recent hospitalization his abdominal pain continues to improve. He still has some nausea (improved with phenergan), but no vomiting, and still has some dizziness worse for the past 2-3 days, despite being off all his blood pressuer medications.  He did follow up with GI on 05/24/14 and is scheduled for colonoscopy on 06/01/14 for further evaluation.  Orthostatics today was negative but he did have a rise in his pulse from 105 sitting to 126 standing.  He admits to decreased po intake with maybe 2 meals a day but has been drinking a lot of Gatorade lately.   He plans to follow with Dr. Dwyane Dee for his diabetes and I will discuss with him in regards to continuing victoza given his GI complaints in the meantime. He did bring his meter today and CBGs appear well controlled, he denies any hypoglycemic events, and is compliant with victoza, glipizide, and metformin.   Past Medical History  Diagnosis Date  . Diabetes mellitus without complication   . Hypertension   . Hyperlipidemia    Current Outpatient Prescriptions  Medication Sig Dispense Refill  . aspirin 81 MG tablet Take 81 mg by mouth daily.      . Cholecalciferol (VITAMIN D PO) Take 1 tablet by mouth daily.      . ciprofloxacin (CIPRO) 500 MG tablet Take 1 tablet (500 mg total) by mouth 2 (two) times daily.  8 tablet  0  . gabapentin (NEURONTIN) 600 MG tablet Take 1 tablet (600 mg total) by mouth 3 (three) times daily.  90 tablet  3  . glipiZIDE (GLUCOTROL) 5 MG tablet Take 1/2 tablet daily with breakfast  15 tablet  5  . Liraglutide 18 MG/3ML SOPN Inject 1.2 mg into the skin daily.  2 pen  5  .  magnesium oxide (MAG-OX) 400 MG tablet Take 800 mg by mouth daily.      . Magnesium Oxide 400 (240 MG) MG TABS Take 800 mg by mouth daily with breakfast.  60 tablet  1  . metFORMIN (GLUCOPHAGE) 500 MG tablet Take 1,000 mg by mouth 2 (two) times daily with a meal.      . MOVIPREP 100 G SOLR Take 1 kit (200 g total) by mouth once.  1 kit  0  . NOVOTWIST 32G X 5 MM MISC       . ONETOUCH DELICA LANCETS FINE MISC       . ONETOUCH VERIO test strip       . pantoprazole (PROTONIX) 40 MG tablet Take 1 tablet (40 mg total) by mouth daily.  30 tablet  0  . pravastatin (PRAVACHOL) 40 MG tablet Take 40 mg by mouth every morning.       . promethazine (PHENERGAN) 25 MG tablet Take 1 tablet (25 mg total) by mouth every 8 (eight) hours as needed for nausea or vomiting.  20 tablet  0   No current facility-administered medications for this visit.   Family History  Problem Relation Age of Onset  . Hypertension Mother   . Diabetes Maternal Grandfather   . Colon cancer Neg Hx   .  Throat cancer Neg Hx   . Prostate cancer Neg Hx   . Pancreatic cancer Neg Hx   . Heart disease Neg Hx   . Kidney disease Neg Hx   . Liver disease Neg Hx    History   Social History  . Marital Status: Married    Spouse Name: N/A    Number of Children: N/A  . Years of Education: N/A   Social History Main Topics  . Smoking status: Never Smoker   . Smokeless tobacco: Never Used  . Alcohol Use: No  . Drug Use: No  . Sexual Activity: None   Other Topics Concern  . None   Social History Narrative  . None   Review of Systems:  Constitutional:  Decreased appetite. Denies fever, chills.  HEENT:  Denies congestion  Respiratory:  Denies SOB, DOE, cough, and wheezing.   Cardiovascular:  Tachycardia. Denies chest pain, palpitations, and leg swelling.   Gastrointestinal:  Nausea and abdominal pain. Denies vomiting.   Genitourinary:  Denies dysuria  Musculoskeletal:  Denies gait problem  Skin:  Denies pallor, rash and wound.    Neurological:  Dizziness, near syncope   Objective:  Physical Exam: Filed Vitals:   05/30/14 1510  BP: 106/76  Pulse: 105  Temp: 98.9 F (37.2 C)  TempSrc: Oral  Resp: 20  Height: 6' 0.5" (1.842 m)  Weight: 265 lb 4.8 oz (120.339 kg)  SpO2: 97%   Vitals reviewed. General: sitting in chair, NAD HEENT: PERRL, EOMI, glasses but they are crooked  Cardiac: tachycardia Pulm: clear to auscultation bilaterally, no wheezes, rales, or rhonchi Abd: soft, obese, nontender, nondistended, BS present Ext: warm and well perfused, no pedal edema, moving all 4 extremities Neuro: alert and oriented X3, strength and sensation to light touch equal in bilateral upper and lower extremities, no dizziness illicited on full rotation of head or with nodding, -rombergs, gait steady  Assessment & Plan:  Discussed with Dr. Stann Mainland D/c phenergan Cbc, bmet, mag, and hiv today Need to talk with dr. Dwyane Dee about victoza

## 2014-05-30 NOTE — Assessment & Plan Note (Addendum)
Followed up with GI as planned by Dr. Lucila [place]. Abdominal pain improving but still has mild discomfort daily. Lots of nausea but no vomiting.   -colonoscopy scheduled for 06/01/14 -following with Dr. Fuller Plan  Addendum 05/31/14: -HIV non reactive -CBC with  resolved thrombocytosis

## 2014-05-30 NOTE — Progress Notes (Signed)
Case discussed with Dr. Qureshi at the time of the visit.  We reviewed the resident's history and exam and pertinent patient test results.  I agree with the assessment, diagnosis, and plan of care documented in the resident's note. 

## 2014-05-30 NOTE — Assessment & Plan Note (Addendum)
Lab Results  Component Value Date   HGBA1C 6.5* 05/08/2014    Assessment: Diabetes control: good control (HgbA1C at goal) Progress toward A1C goal:  at goal Comments: follows with Dr. Dwyane Dee from endocrinology  Plan: Medications:  continue current medications victoza 1.2mg  qd, glipizide 2.5mg  qd, and metformin 1000mg  bid Home glucose monitoring: Frequency:   Timing: before meals Instruction/counseling given: reminded to bring blood glucose meter & log to each visit and reminded to bring medications to each visit Educational resources provided:   Self management tools provided:   Other plans: may need to hold or d/c victoza given GI complaints  Addendum 05/30/14: I spoke with Dr. Dwyane Dee on the phone this evening. Will hold victoza until he sees him in the office later this month. Will notify the patient on the phone.

## 2014-05-30 NOTE — Assessment & Plan Note (Signed)
Improved since hospital discharge.   -continue off all medications

## 2014-05-30 NOTE — Assessment & Plan Note (Addendum)
Has been taking mag ox  -recheck mag today  Addendum 05/31/14: Mag still low 1.3 Will increase mag ox to 1000mg  qd, discussed with patient, if too difficult to break tablets in half, can take 1200mg  daily (3 tablets daily)

## 2014-05-30 NOTE — Assessment & Plan Note (Signed)
Syncope prior to hospital admission, dizziness upon standing after prolonged sitting, notable for the past few days. Not orthostatic today based on BP but did have pulse increase by 21 from sitting to standing. Dizziness not reproducible on physical exam today. Persistent mild tachycardia with HR <110.   Unclear etiology, possibly dehydration (BUN was up to 16 on last lab draw last week and has decreased po intake) vs. Medication induced (although on phenergan which should possibly help)? cva seems unlikely with -cva, no gait disturbance or obvious cranial nerve deficit. His eye glasses are visibly crooked but he does not think this could be a cause.   -will d/c phernergan for now -encouraged po intake and increased fluid intake (of note does have upcoming colonoscopy) -caution when going from sitting to standing and taking his time and using support -avoid driving

## 2014-05-30 NOTE — Assessment & Plan Note (Signed)
BP Readings from Last 3 Encounters:  05/30/14 106/76  05/24/14 126/90  05/23/14 108/81   Lab Results  Component Value Date   NA 140 05/23/2014   K 4.8 05/23/2014   CREATININE 1.03 05/23/2014   Assessment: Blood pressure control: controlled Progress toward BP goal:  at goal  Plan: Medications:  off all medications

## 2014-05-31 LAB — BASIC METABOLIC PANEL WITH GFR
BUN: 15 mg/dL (ref 6–23)
CALCIUM: 9.7 mg/dL (ref 8.4–10.5)
CO2: 27 meq/L (ref 19–32)
Chloride: 102 mEq/L (ref 96–112)
Creat: 1.04 mg/dL (ref 0.50–1.35)
GFR, Est African American: >89 mL/min
GFR, Est Non African American: 82 mL/min
GLUCOSE: 69 mg/dL — AB (ref 70–99)
POTASSIUM: 4.3 meq/L (ref 3.5–5.3)
SODIUM: 140 meq/L (ref 135–145)

## 2014-05-31 LAB — HIV ANTIBODY (ROUTINE TESTING W REFLEX): HIV: NONREACTIVE

## 2014-05-31 LAB — MAGNESIUM: MAGNESIUM: 1.3 mg/dL — AB (ref 1.5–2.5)

## 2014-05-31 MED ORDER — MAGNESIUM OXIDE -MG SUPPLEMENT 400 (240 MG) MG PO TABS: 1000.0000 mg | ORAL_TABLET | Freq: Every day | ORAL | Status: DC

## 2014-05-31 NOTE — Addendum Note (Signed)
Addended by: Wilber Oliphant on: 05/31/2014 09:09 AM   Modules accepted: Orders

## 2014-06-01 ENCOUNTER — Encounter: Payer: Self-pay | Admitting: Gastroenterology

## 2014-06-01 ENCOUNTER — Ambulatory Visit (AMBULATORY_SURGERY_CENTER): Payer: BC Managed Care – PPO | Admitting: Gastroenterology

## 2014-06-01 VITALS — BP 133/78 | HR 88 | Temp 97.7°F | Resp 20 | Ht 73.0 in | Wt 265.0 lb

## 2014-06-01 DIAGNOSIS — K633 Ulcer of intestine: Secondary | ICD-10-CM

## 2014-06-01 DIAGNOSIS — R109 Unspecified abdominal pain: Secondary | ICD-10-CM

## 2014-06-01 DIAGNOSIS — R197 Diarrhea, unspecified: Secondary | ICD-10-CM

## 2014-06-01 DIAGNOSIS — K5289 Other specified noninfective gastroenteritis and colitis: Secondary | ICD-10-CM

## 2014-06-01 DIAGNOSIS — R933 Abnormal findings on diagnostic imaging of other parts of digestive tract: Secondary | ICD-10-CM

## 2014-06-01 DIAGNOSIS — K529 Noninfective gastroenteritis and colitis, unspecified: Secondary | ICD-10-CM

## 2014-06-01 HISTORY — PX: COLONOSCOPY: SHX174

## 2014-06-01 LAB — GLUCOSE, CAPILLARY: GLUCOSE-CAPILLARY: 121 mg/dL — AB (ref 70–99)

## 2014-06-01 MED ORDER — SODIUM CHLORIDE 0.9 % IV SOLN: 500.0000 mL | INTRAVENOUS | Status: DC

## 2014-06-01 NOTE — Patient Instructions (Signed)
YOU HAD AN ENDOSCOPIC PROCEDURE TODAY AT THE [facility]: Refer to the procedure report that was given to you for any specific questions about what was found during the examination.  If the procedure report does not answer your questions, please call your gastroenterologist to clarify.  If you requested that your care partner not be given the details of your procedure findings, then the procedure report has been included in a sealed envelope for you to review at your convenience later.  YOU SHOULD EXPECT: Some feelings of bloating in the abdomen. Passage of more gas than usual.  Walking can help get rid of the air that was put into your GI tract during the procedure and reduce the bloating. If you had a lower endoscopy (such as a colonoscopy or flexible sigmoidoscopy) you may notice spotting of blood in your stool or on the toilet paper. If you underwent a bowel prep for your procedure, then you may not have a normal bowel movement for a few days.  DIET: Your first meal following the procedure should be a light meal and then it is ok to progress to your normal diet.  A half-sandwich or bowl of soup is an example of a good first meal.  Heavy or fried foods are harder to digest and may make you feel nauseous or bloated.  Likewise meals heavy in dairy and vegetables can cause extra gas to form and this can also increase the bloating.  Drink plenty of fluids but you should avoid alcoholic beverages for 24 hours.  ACTIVITY: Your care partner should take you home directly after the procedure.  You should plan to take it easy, moving slowly for the rest of the day.  You can resume normal activity the day after the procedure however you should NOT DRIVE or use heavy machinery for 24 hours (because of the sedation medicines used during the test).    SYMPTOMS TO REPORT IMMEDIATELY: A gastroenterologist can be reached at any hour.  During normal business hours, 8:30 AM to 5:00 PM Monday through Friday,  call [phone].  After hours and on weekends, please call the GI answering service at [phone] who will take a message and have the physician on call contact you.   Following lower endoscopy (colonoscopy or flexible sigmoidoscopy):  Excessive amounts of blood in the stool  Significant tenderness or worsening of abdominal pains  Swelling of the abdomen that is new, acute  Fever of 100F or higher  FOLLOW UP: If any biopsies were taken you will be contacted by phone or by letter within the next 1-3 weeks.  Call your gastroenterologist if you have not heard about the biopsies in 3 weeks.  Our staff will call the home number listed on your records the next business day following your procedure to check on you and address any questions or concerns that you may have at that time regarding the information given to you following your procedure. This is a courtesy call and so if there is no answer at the home number and we have not heard from you through the emergency physician on call, we will assume that you have returned to your regular daily activities without incident.  SIGNATURES/CONFIDENTIALITY: You and/or your care partner have signed paperwork which will be entered into your electronic medical record.  These signatures attest to the fact that that the information above on your After Visit Summary has been reviewed and is understood.  Full responsibility of the confidentiality of this   discharge information lies with you and/or your care-partner.  Colitis, hemorrhoids-handouts given  Call to schedule a follow-up appointment Alonza Bogus [facility] in clinic in 2-3 weeks.

## 2014-06-01 NOTE — Progress Notes (Signed)
Called to room to assist during endoscopic procedure.  Patient ID and intended procedure confirmed with present staff. Received instructions for my participation in the procedure from the performing physician.  

## 2014-06-01 NOTE — Progress Notes (Signed)
Pt stable to RR 

## 2014-06-01 NOTE — Op Note (Signed)
[place]  [address] [place], [address]   COLONOSCOPY PROCEDURE REPORT  PATIENT: Jack Wallace, Jack Wallace  MR#: [id number] BIRTHDATE: 1961-12-16 , 17  yrs. old GENDER: Male ENDOSCOPIST: Ladene Artist, MD, Marval Regal REFERRED BY: Talmadge Coventry, M.D. PROCEDURE DATE:  06/01/2014 PROCEDURE:   Colonoscopy with biopsy First Screening Colonoscopy - Avg.  risk and is 50 yrs.  old or older - No.  Prior Negative Screening - Now for repeat screening. N/A  History of Adenoma - Now for follow-up colonoscopy & has been > or = to 3 yrs.  N/A  Polyps Removed Today? No.  Recommend repeat exam, <10 yrs? No. ASA CLASS:   Class II INDICATIONS:an abnormal CT, Abdominal pain, and unexplained diarrhea. MEDICATIONS: MAC sedation, administered by CRNA and propofol (Diprivan) 250mg  IV DESCRIPTION OF PROCEDURE:   After the risks benefits and alternatives of the procedure were thoroughly explained, informed consent was obtained.  A digital rectal exam revealed no abnormalities of the rectum.   The LB [id number]  endoscope was introduced through the anus and advanced to the terminal ileum which was intubated for a short distance. No adverse events experienced.   The quality of the prep was good, using MoviPrep The instrument was then slowly withdrawn as the colon was fully examined.  COLON FINDINGS: Abnormal mucosa was found at the cecum and in the ascending colon.  The mucosa was ulcerated, friable and had scarring and superficial ulcers.  This was likely consistent with ischemic colitis disease.  Multiple biopsies of the area were performed.   The colon was otherwise normal.  There was no diverticulosis, inflammation, polyps or cancers unless previously stated.  Retroflexed views revealed internal hemorrhoids. The time to cecum=0 minutes 37 seconds.  Withdrawal time=11 minutes 26 seconds.  The scope was withdrawn and the procedure completed. COMPLICATIONS: There were no  complications.  ENDOSCOPIC IMPRESSION: 1.   Abnormal mucosa at the cecum and in the ascending colon; multiple biopsies performed 2.   Small internal hemorrhoids  RECOMMENDATIONS: 1.  Await pathology results 2.  Call to schedule a follow-up appointment with GI Clinic 2-3 weeks with Alonza Bogus, [facility]  eSigned:  Ladene Artist, MD, [facility] 06/01/2014 1:52 PM

## 2014-06-04 ENCOUNTER — Telehealth: Payer: Self-pay | Admitting: *Deleted

## 2014-06-04 LAB — GLUCOSE, CAPILLARY: Glucose-Capillary: 117 mg/dL — ABNORMAL HIGH (ref 70–99)

## 2014-06-04 NOTE — Progress Notes (Signed)
I saw and evaluated the patient.  I personally confirmed the key portions of the history and exam documented by Dr. Cater and I reviewed pertinent patient test results.  The assessment, diagnosis, and plan were formulated together and I agree with the documentation in the resident's note. 

## 2014-06-04 NOTE — Telephone Encounter (Signed)
Message left

## 2014-06-06 ENCOUNTER — Encounter: Payer: Self-pay | Admitting: Gastroenterology

## 2014-06-06 ENCOUNTER — Telehealth: Payer: Self-pay | Admitting: Gastroenterology

## 2014-06-06 NOTE — Telephone Encounter (Signed)
Patient is scheduled to see Alonza Bogus, PA on 06/13/14 at 2:00

## 2014-06-07 ENCOUNTER — Encounter: Payer: Self-pay | Admitting: Internal Medicine

## 2014-06-07 ENCOUNTER — Ambulatory Visit (INDEPENDENT_AMBULATORY_CARE_PROVIDER_SITE_OTHER): Payer: BC Managed Care – PPO | Admitting: Internal Medicine

## 2014-06-07 ENCOUNTER — Other Ambulatory Visit: Payer: BC Managed Care – PPO | Admitting: Internal Medicine

## 2014-06-07 VITALS — BP 116/75 | HR 95 | Temp 98.3°F | Ht 72.6 in | Wt 267.6 lb

## 2014-06-07 DIAGNOSIS — E1165 Type 2 diabetes mellitus with hyperglycemia: Secondary | ICD-10-CM

## 2014-06-07 DIAGNOSIS — I959 Hypotension, unspecified: Secondary | ICD-10-CM

## 2014-06-07 DIAGNOSIS — IMO0001 Reserved for inherently not codable concepts without codable children: Secondary | ICD-10-CM

## 2014-06-07 DIAGNOSIS — K6389 Other specified diseases of intestine: Secondary | ICD-10-CM

## 2014-06-07 MED ORDER — PANTOPRAZOLE SODIUM 40 MG PO TBEC: 40.0000 mg | DELAYED_RELEASE_TABLET | Freq: Every day | ORAL | Status: DC

## 2014-06-07 NOTE — Patient Instructions (Signed)
Follow up with your Gastroenterologist and Endocrinologist as scheduled.

## 2014-06-07 NOTE — Assessment & Plan Note (Signed)
BP improving upon holding his BP medications and within normal limits.  Plans: Continue to hold the medications.

## 2014-06-07 NOTE — Assessment & Plan Note (Signed)
S/p colonoscopy on 06/01/14 revealing abnormal mucosa at caecum and ascending colon. Biopsy results from 06/06/14 reveal mucosal ulceration with associated inflammation and fibrosis. Findings suggestive of chronic ischemic type of injury.  Plans: Management per GI.

## 2014-06-07 NOTE — Progress Notes (Signed)
Subjective:   [name]   DOB: 1961-03-20 53 y.o.   MRN: [id number]  HPI: [name] is a 53 y.o. gentleman with recent history of hospitalization for severe hypotension and Intestinal pneumatosis comes to the office for follow up.  Patient complains of minimal abdominal pain in the RLQ 4-5/10 in severity but denies any nausea/vomiting/constipation. He does report few episodes of diarrhea last night and called his GI to be seen on 06/13/14. Patient reports he has an appointment with Endocrinologist on 05/11/14.  Sequence of events that happened since his his hospitalization are as follows  05/07/14 to 05/17/14:   Hospitalized for severe hypotension leading to ischemic colitis and Intestinal pneumatosis. NM Hepatobiliary study was normal. [place] abd - Normal. CXR- normal. CT angio abd/pelvis revealed pneumatosis involving caecum and air in the mesenteric veins. 2D Echo - grade 1 diastolic dysfunction with LVEF 55-60%. Patient was discharged on cipro + flagyl x 14 days with a GI follow up.  05/24/14: GI follow up - recommended and scheduled for colonoscopy on 06/01/14.  06/01/14: Colonoscopy revealed abnormal mucosa at caecum and ascending colon. Multiple biopsies were sent.  06/06/14: Biopsy reveals mucosal ulceration with associated inflammation and fibrosis. Chronic ischemic injury. No malignancy.  Patient denies any other complaints.  Past Medical History  Diagnosis Date  . Diabetes mellitus without complication   . Hypertension   . Hyperlipidemia    Current Outpatient Prescriptions  Medication Sig Dispense Refill  . aspirin 81 MG tablet Take 81 mg by mouth daily.      . Cholecalciferol (VITAMIN D PO) Take 1 tablet by mouth daily.      Marland Kitchen gabapentin (NEURONTIN) 600 MG tablet Take 1 tablet (600 mg total) by mouth 3 (three) times daily.  90 tablet  3  . glipiZIDE (GLUCOTROL) 5 MG tablet Take 1/2 tablet daily with breakfast  15 tablet  5  . Liraglutide 18 MG/3ML SOPN  Inject 1.2 mg into the skin daily.  2 pen  5  . Magnesium Oxide 400 (240 MG) MG TABS Take 1,000 mg by mouth daily with breakfast.  60 tablet  1  . metFORMIN (GLUCOPHAGE) 500 MG tablet Take 1,000 mg by mouth 2 (two) times daily with a meal.      . NOVOTWIST 32G X 5 MM MISC       . ONETOUCH DELICA LANCETS FINE MISC       . ONETOUCH VERIO test strip       . pantoprazole (PROTONIX) 40 MG tablet Take 1 tablet (40 mg total) by mouth daily.  30 tablet  3  . pravastatin (PRAVACHOL) 40 MG tablet Take 40 mg by mouth every morning.        No current facility-administered medications for this visit.   Family History  Problem Relation Age of Onset  . Hypertension Mother   . Diabetes Maternal Grandfather   . Colon cancer Neg Hx   . Throat cancer Neg Hx   . Prostate cancer Neg Hx   . Pancreatic cancer Neg Hx   . Heart disease Neg Hx   . Kidney disease Neg Hx   . Liver disease Neg Hx    History   Social History  . Marital Status: Married    Spouse Name: N/A    Number of Children: N/A  . Years of Education: N/A   Social History Main Topics  . Smoking status: Never Smoker   . Smokeless tobacco: Never Used  . Alcohol  Use: No  . Drug Use: No  . Sexual Activity: None   Other Topics Concern  . None   Social History Narrative  . None   Review of Systems: Pertinent items are noted in HPI. Objective:  Physical Exam: Filed Vitals:   06/07/14 1550  BP: 116/75  Pulse: 95  Temp: 98.3 F (36.8 C)  TempSrc: Oral  Height: 6' 0.6" (1.844 m)  Weight: 267 lb 9.6 oz (121.383 kg)  SpO2: 98%   Constitutional: Vital signs reviewed.  Patient is a well-developed and well-nourished and is in no acute distress and cooperative with exam.  Cardiovascular: RRR, S1 normal, S2 normal, no MRG Pulmonary/Chest: normal respiratory effort, CTAB, no wheezes, rales, or rhonchi Abdominal: Soft, obese, non-distended. Mild TTP noted over the Right lower quadrant. Mild bruising noted over both sides of the  umbilicus from his Victoza injections. GU: no CVA tenderness Neurological: A&O x3  Assessment & Plan:

## 2014-06-07 NOTE — Assessment & Plan Note (Signed)
Well controlled with an A1C of 6.5 (05/08/14) Currently holding Victoza due to ongoing GI problems.  Plans: Patient is to follow up with Dr. Dwyane Dee on 06/11/14

## 2014-06-11 ENCOUNTER — Ambulatory Visit (INDEPENDENT_AMBULATORY_CARE_PROVIDER_SITE_OTHER): Payer: BC Managed Care – PPO | Admitting: Endocrinology

## 2014-06-11 ENCOUNTER — Other Ambulatory Visit: Payer: Self-pay | Admitting: *Deleted

## 2014-06-11 ENCOUNTER — Encounter: Payer: Self-pay | Admitting: *Deleted

## 2014-06-11 VITALS — BP 114/72 | HR 91 | Temp 98.3°F | Resp 16 | Ht 73.0 in | Wt 266.0 lb

## 2014-06-11 DIAGNOSIS — E1165 Type 2 diabetes mellitus with hyperglycemia: Principal | ICD-10-CM

## 2014-06-11 DIAGNOSIS — IMO0001 Reserved for inherently not codable concepts without codable children: Secondary | ICD-10-CM

## 2014-06-11 NOTE — Progress Notes (Signed)
Case discussed with Dr. Boggala at the time of the visit.  We reviewed the resident's history and exam and pertinent patient test results.  I agree with the assessment, diagnosis, and plan of care documented in the resident's note. 

## 2014-06-11 NOTE — Progress Notes (Signed)
Patient ID: Jack Wallace, male   DOB: [birth year], 53 y.o.   MRN: [id number]   Reason for Appointment : Followup of Type 2 Diabetes  History of Present Illness            Diagnosis: Type 2 diabetes mellitus, date of diagnosis: 1993         Past history: He has been treated with various drugs for his diabetes over the last several years. He has been taking metformin for at least 10 years and has been tolerating this. Over the years he has had additional medications to improve his control. He was also taking Actos at some point and not clear if he had any side effects. This was stopped because of fear of long-term effects The last 5 years he was on glipizide also. Previously was taking 10 mg twice a day. About 2 years ago his blood sugars were poorly controlled with readings up to 300 In early 2014 he started walking and lost 15 pounds. Subsequently he was getting low blood sugar during the day especially before lunch and sometimes in the afternoon His glipizide was  reduced but even with 5 mg twice a day he was getting hypoglycemia. This was reduced and Invokana added. Invokana was stopped because of lack of clear benefit in 09/2013  Because of inadequate control he was started on Victoza on his  visit in 10/14 in addition to his metformin and glipizide  Recent history:  With Victoza his blood sugars have been considerably better and fairly good throughout the day  He has had difficulty with nausea related to Victoza  Also since his recent hospitalization for ischemic colitis he has taken only 0.6 mg; he also still has a decreased appetite He has lost weight from his illness Again he is continuing to take his glipizide before breakfast instead of before supper as instructed on every visit Is getting some hypoglycemia before lunch Overall blood sugars are relatively low again at lunchtime but also fairly good throughout the day Often not checking his blood sugars after supper A1c was checked  during hospitalization and was good  Monitors blood glucose:  1.8 times a day        Glucometer:  One Touch Verio Blood Glucose readings from meter download:   PREMEAL Breakfast Lunch  afternoon  Bedtime Overall  Glucose range:  108-136   69-138   88-104     Mean/median:  119  98     110    The diet that the patient has been following TG:YBWLS to limit carbohydrates. Meals: 2 meals per day.  usually eating a frozen meal at breakfast 9 am and supper 6 pm and only crackers at lunch. Will have more snacks and evenings including chips and crackers        Dietician visit: Most recent:1993-95   Wt Readings from Last 3 Encounters:  06/11/14 266 lb (120.657 kg)  06/07/14 267 lb 9.6 oz (121.383 kg)  06/01/14 265 lb (120.203 kg)     Hypoglycemia : Usually at lunch time with 2 readings in the 60s   Physical activity: exercise: No specific exercise, just  started walking to work and to the drug store which maybe 45 minutes both ways          His last A1c was 6.5 on 01/17/14, previously 7.1 in 11/14 and 7.3 on 08/17/13, done from outside lab  Lab Results  Component Value Date   HGBA1C 6.5* 05/08/2014   Retinal exam: Most recent:.  7/13    Medication List       This list is accurate as of: 06/11/14  4:27 PM.  Always use your most recent med list.               aspirin 81 MG tablet  Take 81 mg by mouth daily.     gabapentin 600 MG tablet  Commonly known as:  NEURONTIN  Take 1 tablet (600 mg total) by mouth 3 (three) times daily.     glipiZIDE 5 MG tablet  Commonly known as:  GLUCOTROL  Take 1/2 tablet daily with breakfast     Liraglutide 18 MG/3ML Sopn  Inject 6 mg into the skin daily.     Magnesium Oxide 400 (240 MG) MG Tabs  Take 1,000 mg by mouth daily with breakfast.     metFORMIN 500 MG tablet  Commonly known as:  GLUCOPHAGE  Take 1,000 mg by mouth 2 (two) times daily with a meal.     NOVOTWIST 32G X 5 MM Misc  Generic drug:  Insulin Pen Needle     ONETOUCH DELICA  LANCETS FINE Misc     ONETOUCH VERIO test strip  Generic drug:  glucose blood     pantoprazole 40 MG tablet  Commonly known as:  PROTONIX  Take 1 tablet (40 mg total) by mouth daily.     pravastatin 40 MG tablet  Commonly known as:  PRAVACHOL  Take 40 mg by mouth every morning.     VITAMIN D PO  Take 1 tablet by mouth daily.        Allergies: No Known Allergies  Past Medical History  Diagnosis Date  . Diabetes mellitus without complication   . Hypertension   . Hyperlipidemia     Past Surgical History  Procedure Laterality Date  . Nasal septum surgery    . Colonoscopy  06/01/2014    Family History  Problem Relation Age of Onset  . Hypertension Mother   . Diabetes Maternal Grandfather   . Colon cancer Neg Hx   . Throat cancer Neg Hx   . Prostate cancer Neg Hx   . Pancreatic cancer Neg Hx   . Heart disease Neg Hx   . Kidney disease Neg Hx   . Liver disease Neg Hx     Social History:  reports that he has never smoked. He has never used smokeless tobacco. He reports that he does not drink alcohol or use illicit drugs.    Review of Systems      Hyperlipidemia: taking pravastatin  40 mg for several years .Lipids show LDL 73 with triglycerides 172 and HDL 36, done on 01/17/14     The blood pressure has been controlled without any medications recently. Checking at home periodically and recently highest reading 150 only one time; usually 120/80  He has had symptoms of neuropathy requiring gabapentin  Foot exam last: 3/15  Physical Examination:  BP 114/72  Pulse 91  Temp(Src) 98.3 F (36.8 C)  Resp 16  Ht 6\' 1"  (1.854 m)  Wt 266 lb (120.657 kg)  BMI 35.10 kg/m2  SpO2 95%  No  edema   ASSESSMENT/PLAN:   Diabetes type 2:  Blood sugars are well controlled He has lost weight from intercurrent illness and recently decreased appetite Since she is only able to tolerate 0.6 mg of Victoza will continue this For now will stop his glipizide which is tending to  cause mild hypoglycemia He can try taking glipizide if needed again  before supper if postprandial readings are over 160 Advised him not to take this in the morning A1c is excellent at 6.5  HYPERTENSION: Blood pressure is normal without any ACE inhibitor at the moment    [facility] 06/11/2014, 4:27 PM

## 2014-06-11 NOTE — Patient Instructions (Addendum)
Stop Glipizide and only if sugars after supper > 160-170 then start glipizide to half tablet with dinner time instead of morning  Check a1c at work in 2 months

## 2014-06-13 ENCOUNTER — Encounter: Payer: Self-pay | Admitting: Gastroenterology

## 2014-06-13 ENCOUNTER — Ambulatory Visit (INDEPENDENT_AMBULATORY_CARE_PROVIDER_SITE_OTHER): Payer: BC Managed Care – PPO | Admitting: Gastroenterology

## 2014-06-13 VITALS — BP 100/70 | HR 104 | Ht 71.75 in | Wt 263.4 lb

## 2014-06-13 DIAGNOSIS — K559 Vascular disorder of intestine, unspecified: Secondary | ICD-10-CM | POA: Insufficient documentation

## 2014-06-13 DIAGNOSIS — R197 Diarrhea, unspecified: Secondary | ICD-10-CM

## 2014-06-13 DIAGNOSIS — R109 Unspecified abdominal pain: Secondary | ICD-10-CM | POA: Insufficient documentation

## 2014-06-13 MED ORDER — RESTORA PO CAPS: 1.0000 | ORAL_CAPSULE | Freq: Every day | ORAL | Status: DC

## 2014-06-13 MED ORDER — DICYCLOMINE HCL 10 MG PO CAPS: ORAL_CAPSULE | ORAL | Status: DC

## 2014-06-13 MED ORDER — DICYCLOMINE HCL 20 MG PO TABS: ORAL_TABLET | ORAL | Status: DC

## 2014-06-13 NOTE — Progress Notes (Signed)
     06/13/2014 Jack Wallace [id number] 23-Sep-1961   History of Present Illness:  Patient is a pleasant 53 year old male who is known to Dr. Fuller Plan.  He was recently hospitalized for suspected ischemic colitis.  See my office note from 05/24/2014 for further details.  He underwent colonoscopy by Dr. Fuller Plan on 06/01/2014 at which time he had abnormal mucosa in the cecum and ascending colon as well as internal hemorrhoids.  Biopsies showed changes consistent with ischemic injury.  He comes in today for follow-up after his colonoscopy.  Is still having diarrhea a few times per day.  Also still has right sided abdominal pain that comes and goes but is present daily.  Also he has noticed a lot of bloating.  The nausea has resolved for the most part.  Just of note, he is taking magnesium oxide tabs because his magnesium levels were low and they actually just increase his dose recently as well.   Current Medications, Allergies, Past Medical History, Past Surgical History, Family History and Social History were reviewed in [employer] record.   Physical Exam: BP 100/70  Pulse 104  Ht 5' 11.75" (1.822 m)  Wt 263 lb 6 oz (119.466 kg)  BMI 35.99 kg/m2 General: Well developed white male in no acute distress Head: Normocephalic and atraumatic Eyes:  Sclerae anicteric, conjunctiva pink  Ears: Normal auditory acuity Lungs: Clear throughout to auscultation Heart: Regular rate and rhythm Abdomen: Soft, obese, non-distended.  Normal bowel sounds.  Mild right sided TTP without R/R/G. Musculoskeletal: Symmetrical with no gross deformities  Extremities: No edema  Neurological: Alert oriented x 4, grossly non-focal Psychological:  Alert and cooperative. Normal mood and affect  Assessment and Recommendations: -Ischemic colitis:  Seen recently on colonoscopy and biopsy proven.  Slow to resolve.  Will check a Cdiff due to the ongoing nature of the diarrhea, but I suspect that the magnesium  oxide that he is taking could be contributing to his diarrhea and bloating, especially since it was increased recently.  Will give him Bentyl 20 mg to take twice daily as well as a daily probiotic (samples of Restora given).  He will discuss the situation with the magnesium with his PCP.  Follow-up again in 4-6 weeks.

## 2014-06-13 NOTE — Progress Notes (Signed)
Reviewed and agree with management plan.  Kindsey Eblin T. Oluwafemi Villella, MD FACG 

## 2014-06-13 NOTE — Patient Instructions (Addendum)
Please go to the basement level to our lab for a stool study.  We sent a prescription to [facility].  For Bentyl  20 mg( dicyclomine).   We have given you samples of Restora probiotic.  You can get this at your pharmacy or [facility]. It is over the counter.   [facility] made you a follow up visit with Dr. Fuller Plan for 07-18-2014 at 9:45 am.

## 2014-06-14 ENCOUNTER — Telehealth: Payer: Self-pay | Admitting: *Deleted

## 2014-06-14 ENCOUNTER — Other Ambulatory Visit: Payer: BC Managed Care – PPO

## 2014-06-14 DIAGNOSIS — R109 Unspecified abdominal pain: Secondary | ICD-10-CM

## 2014-06-14 DIAGNOSIS — R197 Diarrhea, unspecified: Secondary | ICD-10-CM

## 2014-06-14 NOTE — Telephone Encounter (Signed)
Pt called stating he saw the GI doctor yesterday and was told the Magnesium he is taking may be causing his diarrhea.  He has one more refill and wants to know if he still needs to take it. He was given Mag ox 400 mg on 6/10, to take 1,000 mg a day. Last Magnesium 1.3 on 6/10  Please call pt at [phone]

## 2014-06-15 LAB — CLOSTRIDIUM DIFFICILE BY PCR: CDIFFPCR: NOT DETECTED

## 2014-06-15 NOTE — Telephone Encounter (Signed)
Pt scheduled  

## 2014-06-15 NOTE — Telephone Encounter (Signed)
I spoke to [name], we will stop the mag for one week and see if any improvement in diarrhea. I have asked him to call his GI doctor if diarrhea persists despite stopping mag. Recheck mag level 1 week. Future order placed, please make lab appt

## 2014-06-20 ENCOUNTER — Other Ambulatory Visit (INDEPENDENT_AMBULATORY_CARE_PROVIDER_SITE_OTHER): Payer: BC Managed Care – PPO

## 2014-06-20 LAB — MAGNESIUM: MAGNESIUM: 1.2 mg/dL — AB (ref 1.5–2.5)

## 2014-06-23 ENCOUNTER — Telehealth: Payer: Self-pay | Admitting: Internal Medicine

## 2014-06-23 NOTE — Telephone Encounter (Signed)
I called Jack Wallace to review his low mag results. His mag supplementation was held given persistent diarrhea per GI note possibly secondary to the [place]. He is asked to call back or we can try again next week. It is persistently low despite supplementation however if diarrhea has improved probably the culprit. Will need to think of other options to replace mag or if symptomatic. Of note, we are not listed as pcp, perhaps his pcp can follow up the issue?

## 2014-06-25 ENCOUNTER — Ambulatory Visit: Payer: BC Managed Care – PPO | Admitting: Gastroenterology

## 2014-06-25 ENCOUNTER — Ambulatory Visit (INDEPENDENT_AMBULATORY_CARE_PROVIDER_SITE_OTHER): Payer: BC Managed Care – PPO | Admitting: Internal Medicine

## 2014-06-25 ENCOUNTER — Encounter: Payer: Self-pay | Admitting: Internal Medicine

## 2014-06-25 DIAGNOSIS — IMO0001 Reserved for inherently not codable concepts without codable children: Secondary | ICD-10-CM

## 2014-06-25 DIAGNOSIS — K559 Vascular disorder of intestine, unspecified: Secondary | ICD-10-CM

## 2014-06-25 DIAGNOSIS — I959 Hypotension, unspecified: Secondary | ICD-10-CM

## 2014-06-25 DIAGNOSIS — E1165 Type 2 diabetes mellitus with hyperglycemia: Secondary | ICD-10-CM

## 2014-06-25 DIAGNOSIS — K6389 Other specified diseases of intestine: Secondary | ICD-10-CM

## 2014-06-25 MED ORDER — PROMETHAZINE HCL 12.5 MG PO TABS: 12.5000 mg | ORAL_TABLET | Freq: Three times a day (TID) | ORAL | Status: DC | PRN

## 2014-06-25 NOTE — Assessment & Plan Note (Signed)
Well controlled with an A1C of 6.5 Endocrinologist discontinued Glipizide on 06/11/14  Plans: Continue Metformin + Victoza.

## 2014-06-25 NOTE — Assessment & Plan Note (Signed)
Management as per GI.

## 2014-06-25 NOTE — Assessment & Plan Note (Signed)
Patient reports checking BP about 6-7 times a day and his values range from SBP:100-120, DBP 70-90. Patient is asymptomatic. Manual BP in the clinic is 116/82  Plans: Continue to monitor his BP.

## 2014-06-25 NOTE — Progress Notes (Signed)
Subjective:   Patient ID: [name] male   DOB: 1961/02/20 53 y.o.   MRN: [id number]  HPI: Jack Wallace is a 53 y.o. gentleman with recent history of hospitalization for severe hypotension and Intestinal pneumatosis comes to the office for follow up.   I saw Jack Wallace on 06/07/14 for abdominal pain. Patient was complaining of minimal abdominal pain in the RLQ 4-5/10 in severity but denies any nausea/vomiting/constipation. He does report few episodes of diarrhea last night and called his GI to be seen on 06/13/14. Patient reports he has an appointment with Endocrinologist on 05/11/14.  Sequence of events that happened since his his hospitalization are as follows   05/07/14 to 05/17/14:  Hospitalized for severe hypotension leading to ischemic colitis and Intestinal pneumatosis. NM Hepatobiliary study was normal. [place] abd - Normal. CXR- normal. CT angio abd/pelvis revealed pneumatosis involving caecum and air in the mesenteric veins. 2D Echo - grade 1 diastolic dysfunction with LVEF 55-60%. Patient was discharged on cipro + flagyl x 14 days with a GI follow up.   05/24/14: GI follow up - recommended and scheduled for colonoscopy on 06/01/14.   06/01/14: Colonoscopy revealed abnormal mucosa at caecum and ascending colon. Multiple biopsies were sent.   06/06/14: Biopsy reveals mucosal ulceration with associated inflammation and fibrosis. Chronic ischemic injury. No malignancy.   06/07/14: Endocrinology follow up. Stopped Glipizide.  06/11/14: GI follow up. Stopped Mg.Oxide. Started on Bentyl, Probiotic. Checked C.diff toxin.  Patient denies any other complaints.   Past Medical History  Diagnosis Date  . Diabetes mellitus without complication   . Hypertension   . Hyperlipidemia   . Ischemic colitis    Current Outpatient Prescriptions  Medication Sig Dispense Refill  . aspirin 81 MG tablet Take 81 mg by mouth daily.      . Cholecalciferol (VITAMIN D PO) Take 1 tablet by mouth daily.      Marland Kitchen  dicyclomine (BENTYL) 20 MG tablet Take 1 tab twice daily as needed for abdominal pain.  60 tablet  3  . gabapentin (NEURONTIN) 600 MG tablet Take 1 tablet (600 mg total) by mouth 3 (three) times daily.  90 tablet  3  . Liraglutide 18 MG/3ML SOPN Inject 6 mg into the skin daily.      . Magnesium Oxide 400 (240 MG) MG TABS Take 1,000 mg by mouth daily with breakfast.  60 tablet  1  . metFORMIN (GLUCOPHAGE) 500 MG tablet Take 1,000 mg by mouth 2 (two) times daily with a meal.      . NOVOTWIST 32G X 5 MM MISC       . ONETOUCH DELICA LANCETS FINE MISC       . ONETOUCH VERIO test strip       . pantoprazole (PROTONIX) 40 MG tablet Take 1 tablet (40 mg total) by mouth daily.  30 tablet  3  . pravastatin (PRAVACHOL) 40 MG tablet Take 40 mg by mouth every morning.       . Probiotic Product (RESTORA) CAPS Take 1 capsule by mouth daily.  6 capsule  0  . promethazine (PHENERGAN) 12.5 MG tablet Take 1 tablet (12.5 mg total) by mouth every 8 (eight) hours as needed for nausea or vomiting.  30 tablet  0   No current facility-administered medications for this visit.   Family History  Problem Relation Age of Onset  . Hypertension Mother   . Diabetes Maternal Grandfather   . Colon cancer Neg Hx   . Throat  cancer Neg Hx   . Prostate cancer Neg Hx   . Pancreatic cancer Neg Hx   . Heart disease Neg Hx   . Kidney disease Neg Hx   . Liver disease Neg Hx    History   Social History  . Marital Status: Married    Spouse Name: N/A    Number of Children: N/A  . Years of Education: N/A   Social History Main Topics  . Smoking status: Never Smoker   . Smokeless tobacco: Never Used  . Alcohol Use: No  . Drug Use: No  . Sexual Activity: None   Other Topics Concern  . None   Social History Narrative  . None   Review of Systems: Pertinent items are noted in HPI. Objective:  Physical Exam: Filed Vitals:   06/25/14 1636  BP: 96/68  Pulse: 93  Temp: 97.1 F (36.2 C)  TempSrc: Oral  Height: 6'  (1.829 m)  Weight: 259 lb 9.6 oz (117.754 kg)  SpO2: 96%   Constitutional: Vital signs reviewed. Patient is a well-developed and well-nourished and is in no acute distress and cooperative with exam.  Cardiovascular: RRR, S1 normal, S2 normal, no MRG  Pulmonary/Chest: normal respiratory effort, CTAB, no wheezes, rales, or rhonchi  Abdominal: Soft, obese, non-distended. Mild TTP noted over the Right lower quadrant.  GU: no CVA tenderness Neurological: A&O x3  Assessment & Plan:

## 2014-06-25 NOTE — Assessment & Plan Note (Signed)
Still low (1.2 from 06/20/14) Patient stopped taking Mag.Oxide from 06/12/14 as per GI instructions secondary to the possibility of the medicine causing diarrhea. Patient reports diarrhea although improved, he still has diarrheal episodes of about 3 per a day occasionally (not every day)  Plans: Restart Magnesium oxide at 1000 mg po qd, as was previously used. Return to clinic to recheck Mg in 2 weeks.

## 2014-06-25 NOTE — Patient Instructions (Addendum)
Start taking the Magnesium tablets as instructed before. Check your magnesium level in 2 weeks. Follow up with Gastroenterology as recommended.

## 2014-06-26 NOTE — Progress Notes (Signed)
Case discussed with Dr. Boggala at the time of the visit.  We reviewed the resident's history and exam and pertinent patient test results.  I agree with the assessment, diagnosis, and plan of care documented in the resident's note. 

## 2014-07-09 ENCOUNTER — Other Ambulatory Visit (INDEPENDENT_AMBULATORY_CARE_PROVIDER_SITE_OTHER): Payer: BC Managed Care – PPO

## 2014-07-09 LAB — MAGNESIUM: Magnesium: 1.2 mg/dL — ABNORMAL LOW (ref 1.5–2.5)

## 2014-07-10 ENCOUNTER — Telehealth: Payer: Self-pay | Admitting: Internal Medicine

## 2014-07-10 MED ORDER — MAGNESIUM OXIDE -MG SUPPLEMENT 400 (240 MG) MG PO TABS: ORAL_TABLET | ORAL | Status: DC

## 2014-07-10 NOTE — Telephone Encounter (Signed)
Called patient to discuss his low magnesium value of 1.2. Patient stated that he is taking Mg. Oxide 400 mg, two tablets a day. Recommended him to take 1 in the morning and one and half n the evening, which would make it 1000 mg a day. Patient is agreeable to the plan. We will check his Mg at his next office visit.

## 2014-07-18 ENCOUNTER — Ambulatory Visit: Payer: BC Managed Care – PPO | Admitting: Gastroenterology

## 2014-07-23 ENCOUNTER — Ambulatory Visit (INDEPENDENT_AMBULATORY_CARE_PROVIDER_SITE_OTHER): Payer: BC Managed Care – PPO | Admitting: Gastroenterology

## 2014-07-23 ENCOUNTER — Encounter: Payer: Self-pay | Admitting: Gastroenterology

## 2014-07-23 VITALS — BP 102/60 | HR 98 | Ht 72.0 in | Wt 267.2 lb

## 2014-07-23 DIAGNOSIS — R1011 Right upper quadrant pain: Secondary | ICD-10-CM

## 2014-07-23 DIAGNOSIS — R197 Diarrhea, unspecified: Secondary | ICD-10-CM

## 2014-07-23 DIAGNOSIS — IMO0001 Reserved for inherently not codable concepts without codable children: Secondary | ICD-10-CM

## 2014-07-23 DIAGNOSIS — R142 Eructation: Secondary | ICD-10-CM

## 2014-07-23 DIAGNOSIS — R141 Gas pain: Secondary | ICD-10-CM

## 2014-07-23 DIAGNOSIS — R143 Flatulence: Secondary | ICD-10-CM

## 2014-07-23 MED ORDER — DICYCLOMINE HCL 20 MG PO TABS: ORAL_TABLET | ORAL | Status: DC

## 2014-07-23 NOTE — Patient Instructions (Signed)
Start taking Bentyl 20 mg twice daily everyday not as needed. Gas ex three times daily. Low gas diet given Follow up in 6 months CC:  Arman Filter MD

## 2014-07-23 NOTE — Progress Notes (Signed)
    History of Present Illness: This is a 53 year old male with a resolved ischemic colitis. He has persistent problems with occasional urgent, loose, watery diarrhea, mild right upper quadrant pain and gas. The majority of his symptoms have substantially improved over time. He takes dicyclomine as needed. He has had a persistently low magnesium level and remains on magnesium supplements.  Current Medications, Allergies, Past Medical History, Past Surgical History, Family History and Social History were reviewed in [employer] record.  Physical Exam: General: Well developed , well nourished, no acute distress Head: Normocephalic and atraumatic Eyes:  sclerae anicteric, EOMI Ears: Normal auditory acuity Mouth: No deformity or lesions Lungs: Clear throughout to auscultation Heart: Regular rate and rhythm; no murmurs, rubs or bruits Abdomen: Soft, non tender and non distended. No masses, hepatosplenomegaly or hernias noted. Normal Bowel sounds Musculoskeletal: Symmetrical with no gross deformities  Pulses:  Normal pulses noted Extremities: No clubbing, cyanosis, edema or deformities noted Neurological: Alert oriented x 4, grossly nonfocal Psychological:  Alert and cooperative. Normal mood and affect  Assessment and Recommendations:  1. Ischemic colitis, resolved. I presume he now has irritable bowel syndrome. Advised Bentyl 20 mg take twice daily (not prn) and Gas-X 3 times a day. Begin a low gas diet. Continue to avoid stressor foods. If his symptoms are not well controlled will change Bentyl to a.c. and at bedtime and consider FODMAP diet. REV in 6 months. Patient is advised to call if his symptoms are not improving.

## 2014-08-13 ENCOUNTER — Other Ambulatory Visit: Payer: Self-pay | Admitting: Endocrinology

## 2014-08-13 ENCOUNTER — Encounter: Payer: Self-pay | Admitting: Endocrinology

## 2014-08-13 ENCOUNTER — Ambulatory Visit (INDEPENDENT_AMBULATORY_CARE_PROVIDER_SITE_OTHER): Payer: BC Managed Care – PPO | Admitting: Endocrinology

## 2014-08-13 VITALS — BP 106/72 | HR 87 | Temp 98.0°F | Resp 16 | Ht 73.0 in | Wt 268.4 lb

## 2014-08-13 DIAGNOSIS — E1165 Type 2 diabetes mellitus with hyperglycemia: Principal | ICD-10-CM

## 2014-08-13 DIAGNOSIS — IMO0001 Reserved for inherently not codable concepts without codable children: Secondary | ICD-10-CM

## 2014-08-13 DIAGNOSIS — E1142 Type 2 diabetes mellitus with diabetic polyneuropathy: Secondary | ICD-10-CM

## 2014-08-13 NOTE — Progress Notes (Signed)
Patient ID: Jack Wallace, male   DOB: Apr 26, 1961, 53 y.o.   MRN: [id number]   Reason for Appointment : Followup of Type 2 Diabetes   History of Present Illness            Diagnosis: Type 2 diabetes mellitus, date of diagnosis: 1993         Past history: He has been treated with various drugs for his diabetes over the last several years. He has been taking metformin for at least 10 years and has been tolerating this. Over the years he has had additional medications to improve his control. He was also taking Actos previously and not clear if he had any side effects. This was stopped because of fear of long-term effects The last 5 years he was on glipizide also. Previously was taking 10 mg twice a day. About 2 years ago his blood sugars were poorly controlled with readings up to 300 In early 2014 he started walking and lost 15 pounds. Subsequently he was getting low blood sugar during the day especially before lunch and sometimes in the afternoon His glipizide was  reduced but even with 5 mg twice a day he was getting hypoglycemia. This was reduced and Invokana added. Invokana was stopped because of lack of clear benefit in 09/2013  Because of inadequate control he was started on Victoza on his  visit in 10/14 in addition to his metformin and glipizide  Recent history:  With Victoza his blood sugars have been considerably better and fairly good throughout the day  He has no recent difficulty with nausea related to Victoza 1.2 mg daily, he thinks some occasional nausea is from other medications He has gained back some of the weight he had lost during his hospitalization despite taking Victoza His glipizide was stopped because of hypoglycemia before lunch on his last visit although he occasionally will take it when his blood sugar is around 170 Oral hypoglycemic drugs: Metformin 2 g  Monitors blood glucose:  1.6 times a day        Glucometer:  One Touch Verio Blood Glucose readings from meter  download:   PREMEAL Breakfast  90-139   5-8 PM  Bedtime Overall  Glucose range: 97-152    109-173   133   90-179   Mean/median:  116      121    The diet that the patient has been following [id number] to limit carbohydrates. Meals: 2 meals per day.  usually eating a frozen meal at breakfast 9 am and supper 6 pm and only crackers at lunch. Will have more snacks  sometimes in the evenings      Dietician visit: Most recent:1993-95   Wt Readings from Last 3 Encounters:  08/13/14 268 lb 6.4 oz (121.745 kg)  07/23/14 267 lb 3.2 oz (121.201 kg)  06/25/14 259 lb 9.6 oz (117.754 kg)     Hypoglycemia : None recently   Physical activity: exercise: No specific exercise,  active at work, walking to work and to the drug store which maybe 20 minutes or more         His last A1c was 7% on 08/06/14   Lab Results  Component Value Date   HGBA1C 6.5* 05/08/2014   Retinal exam: Most recent:. 7/13    Medication List       This list is accurate as of: 08/13/14  4:20 PM.  Always use your most recent med list.  aspirin 81 MG tablet  Take 81 mg by mouth daily.     cetirizine 10 MG tablet  Commonly known as:  ZYRTEC  Take 10 mg by mouth daily.     dicyclomine 20 MG tablet  Commonly known as:  BENTYL  Take 1 tab twice daily     gabapentin 600 MG tablet  Commonly known as:  NEURONTIN  Take 1 tablet (600 mg total) by mouth 3 (three) times daily.     Liraglutide 18 MG/3ML Sopn  Inject 1.2 mg into the skin daily.     Magnesium Oxide 400 (240 MG) MG Tabs  Take one tablet in the morning and one-and-half tablet in the evening.     metFORMIN 500 MG tablet  Commonly known as:  GLUCOPHAGE  Take 1,000 mg by mouth 2 (two) times daily with a meal.     NOVOTWIST 32G X 5 MM Misc  Generic drug:  Insulin Pen Needle     ONETOUCH DELICA LANCETS FINE Misc     ONETOUCH VERIO test strip  Generic drug:  glucose blood     pantoprazole 40 MG tablet  Commonly known as:  PROTONIX  Take 1  tablet (40 mg total) by mouth daily.     pravastatin 40 MG tablet  Commonly known as:  PRAVACHOL  Take 40 mg by mouth every morning.     RESTORA Caps  Take 1 capsule by mouth daily.     simethicone 125 MG chewable tablet  Commonly known as:  MYLICON  Chew 707 mg by mouth every 6 (six) hours as needed for flatulence.     VITAMIN D PO  Take 1 tablet by mouth daily.     VITAMIN E PO  Take 1 capsule by mouth daily.        Allergies: No Known Allergies  Past Medical History  Diagnosis Date  . Diabetes mellitus without complication   . Hypertension   . Hyperlipidemia   . Ischemic colitis     Past Surgical History  Procedure Laterality Date  . Nasal septum surgery    . Colonoscopy  06/01/2014    Family History  Problem Relation Age of Onset  . Hypertension Mother   . Diabetes Maternal Grandfather   . Colon cancer Neg Hx   . Throat cancer Neg Hx   . Prostate cancer Neg Hx   . Pancreatic cancer Neg Hx   . Heart disease Neg Hx   . Kidney disease Neg Hx   . Liver disease Neg Hx     Social History:  reports that he has never smoked. He has never used smokeless tobacco. He reports that he does not drink alcohol or use illicit drugs.    Review of Systems   Dry mouth: This is a new symptom and is bothersome, especially at night     Hyperlipidemia: taking pravastatin  40 mg for several years. .Lipids show LDL 81, triglycerides 142, this was done on 08/06/14     The blood pressure has been controlled without any medications recently.  Checking at home or work periodically   He has had symptoms of neuropathy getting relief with gabapentin and asking for a refill  Foot exam last: 3/15  Physical Examination:  BP 106/72  Pulse 87  Temp(Src) 98 F (36.7 C)  Resp 16  Ht 6\' 1"  (1.854 m)  Wt 268 lb 6.4 oz (121.745 kg)  BMI 35.42 kg/m2  SpO2 97%  No  edema   ASSESSMENT/PLAN:  Diabetes type 2:  Blood sugars are well controlled although relatively higher  fasting at times and only occasionally up to around 170 in the evenings Although his home blood sugars are averaging only about 124 his A1c is 7%, not clear if he has postprandial hyperglycemia Since he does not have any consistently high readings will not restart glipizide as yet May consider Invokana again  Dry mouth: Likely to be from Bentyl, he can take this when necessary  HYPERTENSION: Blood pressure is low normal without any ACE inhibitor currently    Daleyssa Loiselle 08/13/2014, 4:20 PM

## 2014-08-13 NOTE — Patient Instructions (Signed)
Check more readings after meals  May take Bentyl as needed

## 2014-08-15 ENCOUNTER — Telehealth: Payer: Self-pay | Admitting: *Deleted

## 2014-08-15 NOTE — Telephone Encounter (Signed)
Pt calls and states he has brought lab work in twice recently and left them with the receptionist, he also was put in touch w/ a nurse by the receptionist but does not know who he spoke with, he states his magnesium level is low and he is concerned about that, this is ongoing but these lab values are from the past 2 weeks. As we spoke he states he will see dr Trudee Kuster wed 9/2. He was offered an appt Friday 8/27 and refused it stating due to work he will need to wait til wed. Will ask front office to locate lab work.

## 2014-08-22 ENCOUNTER — Ambulatory Visit: Payer: BC Managed Care – PPO | Admitting: Internal Medicine

## 2014-08-23 ENCOUNTER — Ambulatory Visit (INDEPENDENT_AMBULATORY_CARE_PROVIDER_SITE_OTHER): Payer: BC Managed Care – PPO | Admitting: Internal Medicine

## 2014-08-23 ENCOUNTER — Encounter: Payer: Self-pay | Admitting: Internal Medicine

## 2014-08-23 VITALS — BP 115/75 | HR 83 | Temp 98.1°F | Wt 271.8 lb

## 2014-08-23 DIAGNOSIS — Z23 Encounter for immunization: Secondary | ICD-10-CM | POA: Diagnosis not present

## 2014-08-23 DIAGNOSIS — K117 Disturbances of salivary secretion: Secondary | ICD-10-CM

## 2014-08-23 DIAGNOSIS — R682 Dry mouth, unspecified: Principal | ICD-10-CM

## 2014-08-23 DIAGNOSIS — Z0000 Encounter for general adult medical examination without abnormal findings: Secondary | ICD-10-CM

## 2014-08-23 MED ORDER — MAGNESIUM OXIDE -MG SUPPLEMENT 400 (240 MG) MG PO TABS: ORAL_TABLET | ORAL | Status: DC

## 2014-08-23 MED ORDER — BIOTENE ORALBALANCE DRY MOUTH MT GEL: OROMUCOSAL | Status: DC

## 2014-08-23 NOTE — Assessment & Plan Note (Signed)
Etiology unclear could be secondary to medications i.e Gabapentin (though he has been taking for 10 months), pt stopped taking Bentayl, Zyrtec (not taking). Other medications reviewed and do not seem to have this side effect.      Plan Will try Artificial saliva to see if helps. At this time patient prefers to continue Gabapentin for neuropathy symptoms  If not relieved with artificial saliva could try Pilocarpine 5 mg qid though not w/o side effects or w/u for Sjorgrens with SSA/SSB F/u ni 2 weeks for sx's to see if improving  Discussed oral hydration with water, avoiding foods/drinks with a lot of sugar, soda/coffee.  He can also discuss this with his dentist  Discussed chewing sugar free gum may help Discussed humidifier may help with moisturize nasal passages

## 2014-08-23 NOTE — Assessment & Plan Note (Signed)
Etiology unclear can be caused by PPI though patient has had chronic hypomag.  Can be caused by familial disorders such as Giltelmans or [place], etc.  Pt's DM is controlled so less likely etiology Consider further work up in the future with PCP Will Rx more Magnesium tablets and check Mag level today

## 2014-08-23 NOTE — Assessment & Plan Note (Signed)
He was given the flu vaccine and the pneumonia vaccine today

## 2014-08-23 NOTE — Progress Notes (Signed)
   Subjective:    Patient ID: Jack Wallace, male    DOB: 08/04/1961, 53 y.o.   MRN: [id number]  HPI Comments: 53 y.o male h/o ischemic colitis, hypomagnesemia, DM 2 controlled with peripheral neuropathy  He presents for 1)Dry mouth x 3-4 weeks intermittent associated with sore throat which he thinks is due to dry mouth.  He also has dry nares as well but denies cough, dry eyes.  His endocrinologist advised him to stop Bentyl which he has for 11 days w/o relief of sx's.  He is not bothered by eating/drinking and denies dysphagia. He had tried hydration with water and has been avoiding sodas, coffee, juices.  He states talking makes his mouth more dry.   2)HypoMag-wants Magnesium level checked today and also refill of Mag.  He inquires why Mag is low all the time.       Review of Systems  HENT: Negative for rhinorrhea.   Gastrointestinal: Negative for abdominal pain.       Objective:   Physical Exam  Nursing note and vitals reviewed. Constitutional: He is oriented to person, place, and time. Vital signs are normal. He appears well-developed and well-nourished. He is cooperative.  HENT:  Head: Normocephalic and atraumatic.  Mouth/Throat: Oropharynx is clear and moist and mucous membranes are normal. Abnormal dentition. No oropharyngeal exudate.  Eyes: Conjunctivae are normal. Right eye exhibits no discharge. Left eye exhibits no discharge. No scleral icterus.  Cardiovascular: Normal rate, regular rhythm, S1 normal, S2 normal and normal heart sounds.   No murmur heard. No lower ext edema   Pulmonary/Chest: Effort normal and breath sounds normal.  Abdominal: Soft. Bowel sounds are normal. He exhibits no distension. There is no tenderness.  Musculoskeletal: He exhibits no edema.  Neurological: He is alert and oriented to person, place, and time.  Skin: Skin is warm, dry and intact. No rash noted.  Psychiatric: He has a normal mood and affect. His speech is normal and behavior is normal.  Judgment and thought content normal. Cognition and memory are normal.          Assessment & Plan:  F/u in 2 weeks prn dry mouth

## 2014-08-23 NOTE — Patient Instructions (Addendum)
General Instructions: We will check your Magnesium today  Please try artificial saliva for your dry mouth and chewing sugar free gum You can try a humidifier as well Tell you dentist to Follow up in 2 weeks to 1 month if not improved    Treatment Goals:  Goals (1 Years of Data) as of 08/23/14   None      Progress Toward Treatment Goals:  Treatment Goal 08/23/2014  Hemoglobin A1C at goal  Blood pressure at goal    Self Care Goals & Plans:  Self Care Goal 08/23/2014  Eat healthy foods drink diet soda or water instead of juice or soda; eat more vegetables; eat foods that are low in salt; eat baked foods instead of fried foods; eat fruit for snacks and desserts; eat smaller portions  Be physically active find an activity I enjoy  Meeting treatment goals maintain the current self-care plan    Home Blood Glucose Monitoring 05/30/2014  When to check my blood sugar before meals     Care Management & Community Referrals:  Referral 08/23/2014  Referrals made for care management support none needed  Referrals made to community resources none       Sore or Dry Mouth Care A sore or dry mouth may happen for many different reasons. Sometimes, treatment for other health problems may have to stop until your sore or dry mouth gets better.  HOME CARE  Do not smoke or chew tobacco.  Use fake (artificial) saliva when your mouth feels dry.  Use a humidifier in your bedroom at night.  Eat small meals and snacks.  Eat food cold or at room temperature.  Suck on ice-chips or try frozen ice pops or juice bars, ice-cream, and watermelon. Do not have citrus flavors.  Suck on hard, sugarless, sour candy, or chew sugarless gum to help make more saliva.  Eat soft foods such as yogurt, bananas, canned fruit, mashed potatoes, oatmeal, rice, eggs, cottage cheese, macaroni and cheese, jello, and pudding.  Microwave vegetables and fruits to soften them.  Puree cooked food in a blender if  needed.  Make dry food moist by using olive oil, gravy, or mild sauces. Dip foods in liquids.  Keep a glass of water or squirt bottle nearby. Take sips often throughout the day.  Limit caffeine.  Avoid:  Pop or fizzy drinks.  Alcohol.  Citrus juices.  Acidic food.  Salty or spicy food.  Foods or drinks that are very hot.  Hard or crunchy food. Mouth Care  Wash your hands well with soap and water before doing mouth care.  Use fake saliva as told by your doctor.  Use medicine on the sore places.  Brush your teeth at least 2 times a day. Brush after each meal if possible. Rinse your mouth with water after each meal and after drinking a sweet drink.  Brush slowly and gently in small circles. Do not brush side-to-side.  Use regular toothpastes, but stay away from ones that have sodium laurel sulfate in them.  Gargle with a baking soda mouthwash ( teaspoon baking soda mixed in with 4 cups of water).  Gargle with medicated mouthwash.  Use dental floss or dental tape to clean between your teeth every day.  Use a lanolin-based lip balm to keep your lips from getting dry.  If you wear dentures or bridges:  You may need to leave them out until your doctor tells you to start wearing them again.  Take them out at night if  you wear them daily. Soak them in warm water or denture solution. Take your dentures out as much as you can during the day. Take them out when you use mouthwash.  After each meal, brush your gums gently with a soft brush and rinse your mouth with water.  If your dentures rub on your gums and cause a sore spot, have your dentist check and fix your dentures right away. GET HELP RIGHT AWAY IF:   Your mouth gets more painful or dry.  You have questions. MAKE SURE YOU:  Understand these instructions.  Will watch your condition.  Will get help right away if you are not doing well or get worse. Document Released: 10/04/2009 Document Revised: 02/29/2012  Document Reviewed: 10/04/2009 [facility] Patient Information 2015 [place], [place]. This information is not intended to reHypomagnesemia Magnesium is a common ion (mineral) in the body which is needed for metabolism. It is about how the body handles food and other chemical reactions necessary for life. Only about 2% of the magnesium in our body is found in the blood. When this is low, it is called hypomagnesemia. The blood will measure only a tiny amount of the magnesium in our body. When it is low in our blood, it does not mean that the whole body supply is low. The normal serum concentration ranges from 1.8-2.5 mEq/L. When the level gets to be less than 1.0 mEq/L, a number of problems begin to happen.  CAUSES   Receiving intravenous fluids without magnesium replacement.  Loss of magnesium from the bowel by nasogastric suction.  Loss of magnesium from nausea and vomiting or severe diarrhea. Any of the inflammatory bowel conditions can cause this.  Abuse of alcohol often leads to low serum magnesium.  An inherited form of magnesium loss happens when the kidneys lose magnesium. This is called familial or primary hypomagnesemia.  Some medications such as diuretics also cause the loss of magnesium. SYMPTOMS  These following problems are worse if the changes in magnesium levels come on suddenly.  Tremor.  Confusion.  Muscle weakness.  Oversensitive to sights and sounds.  Sensitive reflexes.  Depression.  Muscular fibrillations.  Overreactivity of the nerves.  Irritability.  Psychosis.  Spasms of the hand muscles.  Tetany (where the muscles go into uncontrollable spasms). DIAGNOSIS  This condition can be diagnosed by blood tests. TREATMENT   In an emergency, magnesium can be given intravenously (by vein).  If the condition is less worrisome, it can be corrected by diet. High levels of magnesium are found in green leafy vegetables, peas, beans, and nuts among other things. It  can also be given through medications by mouth.  If it is being caused by medications, changes can be made.  If alcohol is a problem, help is available if there are difficulties giving it up. Document Released: 09/02/2005 Document Revised: 04/23/2014 Document Reviewed: 07/27/2008 [facility] Patient Information 2015 [place], [place]. This information is not intended to replace advice given to you by your health care provider. Make sure you discuss any questions you have with your health care provider. place advice given to you by your health care provider. Make sure you discuss any questions you have with your health care provider.    Pneumococcal Vaccine, Polyvalent solution for injection What is this medicine? PNEUMOCOCCAL VACCINE, POLYVALENT (NEU mo KOK al vak SEEN, pol ee VEY luhnt) is a vaccine to prevent pneumococcus bacteria infection. These bacteria are a major cause of ear infections, Strep throat infections, and serious pneumonia, meningitis, or  blood infections worldwide. These vaccines help the body to produce antibodies (protective substances) that help your body defend against these bacteria. This vaccine is recommended for people 87 years of age and older with health problems. It is also recommended for all adults over 64 years old. This vaccine will not treat an infection. This medicine may be used for other purposes; ask your health care provider or pharmacist if you have questions. COMMON BRAND NAME(S): Pneumovax 23 What should I tell my health care provider before I take this medicine? They need to know if you have any of these conditions: -bleeding problems -bone marrow or organ transplant -cancer, Hodgkin's disease -fever -infection -immune system problems -low platelet count in the blood -seizures -an unusual or allergic reaction to pneumococcal vaccine, diphtheria toxoid, other vaccines, latex, other medicines, foods, dyes, or preservatives -pregnant or trying to get  pregnant -breast-feeding How should I use this medicine? This vaccine is for injection into a muscle or under the skin. It is given by a health care professional. A copy of Vaccine Information Statements will be given before each vaccination. Read this sheet carefully each time. The sheet may change frequently. Talk to your pediatrician regarding the use of this medicine in children. While this drug may be prescribed for children as young as 54 years of age for selected conditions, precautions do apply. Overdosage: If you think you have taken too much of this medicine contact a poison control center or emergency room at once. NOTE: This medicine is only for you. Do not share this medicine with others. What if I miss a dose? It is important not to miss your dose. Call your doctor or health care professional if you are unable to keep an appointment. What may interact with this medicine? -medicines for cancer chemotherapy -medicines that suppress your immune function -medicines that treat or prevent blood clots like warfarin, enoxaparin, and dalteparin -steroid medicines like prednisone or cortisone This list may not describe all possible interactions. Give your health care provider a list of all the medicines, herbs, non-prescription drugs, or dietary supplements you use. Also tell them if you smoke, drink alcohol, or use illegal drugs. Some items may interact with your medicine. What should I watch for while using this medicine? Mild fever and pain should go away in 3 days or less. Report any unusual symptoms to your doctor or health care professional. What side effects may I notice from receiving this medicine? Side effects that you should report to your doctor or health care professional as soon as possible: -allergic reactions like skin rash, itching or hives, swelling of the face, lips, or tongue -breathing problems -confused -fever over 102 degrees F -pain, tingling, numbness in the hands  or feet -seizures -unusual bleeding or bruising -unusual muscle weakness Side effects that usually do not require medical attention (report to your doctor or health care professional if they continue or are bothersome): -aches and pains -diarrhea -fever of 102 degrees F or less -headache -irritable -loss of appetite -pain, tender at site where injected -trouble sleeping This list may not describe all possible side effects. Call your doctor for medical advice about side effects. You may report side effects to FDA at 1-800-FDA-1088. Where should I keep my medicine? This does not apply. This vaccine is given in a clinic, pharmacy, doctor's office, or other health care setting and will not be stored at home. NOTE: This sheet is a summary. It may not cover all possible information. If you have questions  about this medicine, talk to your doctor, pharmacist, or health care provider.  2015, [employer]. (2008-07-13 14:32:37)

## 2014-08-24 ENCOUNTER — Encounter: Payer: Self-pay | Admitting: Internal Medicine

## 2014-08-24 ENCOUNTER — Other Ambulatory Visit: Payer: Self-pay | Admitting: *Deleted

## 2014-08-24 LAB — MAGNESIUM: MAGNESIUM: 1.5 mg/dL (ref 1.5–2.5)

## 2014-08-24 MED ORDER — LIRAGLUTIDE 18 MG/3ML ~~LOC~~ SOPN: 1.2000 mg | PEN_INJECTOR | Freq: Every day | SUBCUTANEOUS | Status: DC

## 2014-08-24 MED ORDER — GABAPENTIN 600 MG PO TABS: ORAL_TABLET | ORAL | Status: DC

## 2014-08-24 NOTE — Progress Notes (Signed)
Case discussed with Dr. McLean soon after the resident saw the patient.  We reviewed the resident's history and exam and pertinent patient test results.  I agree with the assessment, diagnosis, and plan of care documented in the resident's note. 

## 2014-09-28 ENCOUNTER — Other Ambulatory Visit: Payer: Self-pay | Admitting: *Deleted

## 2014-09-28 MED ORDER — GLUCOSE BLOOD VI STRP: ORAL_STRIP | Status: DC

## 2014-09-28 MED ORDER — METFORMIN HCL 500 MG PO TABS: 1000.0000 mg | ORAL_TABLET | Freq: Two times a day (BID) | ORAL | Status: DC

## 2014-10-08 ENCOUNTER — Other Ambulatory Visit: Payer: Self-pay | Admitting: *Deleted

## 2014-10-09 MED ORDER — PANTOPRAZOLE SODIUM 40 MG PO TBEC: 40.0000 mg | DELAYED_RELEASE_TABLET | Freq: Every day | ORAL | Status: DC

## 2014-11-02 ENCOUNTER — Other Ambulatory Visit: Payer: Self-pay | Admitting: *Deleted

## 2014-11-02 MED ORDER — LIRAGLUTIDE 18 MG/3ML ~~LOC~~ SOPN: 1.2000 mg | PEN_INJECTOR | Freq: Every day | SUBCUTANEOUS | Status: DC

## 2014-11-09 ENCOUNTER — Encounter: Payer: Self-pay | Admitting: Internal Medicine

## 2014-11-09 ENCOUNTER — Ambulatory Visit (INDEPENDENT_AMBULATORY_CARE_PROVIDER_SITE_OTHER): Payer: BC Managed Care – PPO | Admitting: Internal Medicine

## 2014-11-09 VITALS — BP 115/80 | HR 83 | Temp 98.0°F | Ht 73.0 in | Wt 275.0 lb

## 2014-11-09 DIAGNOSIS — Z0000 Encounter for general adult medical examination without abnormal findings: Secondary | ICD-10-CM

## 2014-11-09 DIAGNOSIS — K117 Disturbances of salivary secretion: Secondary | ICD-10-CM | POA: Diagnosis not present

## 2014-11-09 DIAGNOSIS — Z23 Encounter for immunization: Secondary | ICD-10-CM

## 2014-11-09 DIAGNOSIS — R682 Dry mouth, unspecified: Secondary | ICD-10-CM

## 2014-11-09 DIAGNOSIS — K559 Vascular disorder of intestine, unspecified: Secondary | ICD-10-CM

## 2014-11-09 DIAGNOSIS — E1142 Type 2 diabetes mellitus with diabetic polyneuropathy: Secondary | ICD-10-CM

## 2014-11-09 NOTE — Patient Instructions (Addendum)
Thank you for coming to clinic today [name].  General instructions: -Make sure your endocrinologist does your foot exam. -You will receive the Tdap vaccine today and we will take a photo of the back of your eyes. -The cause of your low magnesium is unclear, but this may be a genetic problem.  We will continue to supplement your magnesium, but won't do any additional work-up unless it becomes a problem. -Please make a follow up appointment to return to clinic in 6 months.  Thank you for bringing your medicines today. This helps [place] keep you safe from mistakes.    Hypomagnesemia Magnesium is a common ion (mineral) in the body which is needed for metabolism. It is about how the body handles food and other chemical reactions necessary for life. Only about 2% of the magnesium in our body is found in the blood. When this is low, it is called hypomagnesemia. The blood will measure only a tiny amount of the magnesium in our body. When it is low in our blood, it does not mean that the whole body supply is low. The normal serum concentration ranges from 1.8-2.5 mEq/L. When the level gets to be less than 1.0 mEq/L, a number of problems begin to happen.  CAUSES   Receiving intravenous fluids without magnesium replacement.  Loss of magnesium from the bowel by nasogastric suction.  Loss of magnesium from nausea and vomiting or severe diarrhea. Any of the inflammatory bowel conditions can cause this.  Abuse of alcohol often leads to low serum magnesium.  An inherited form of magnesium loss happens when the kidneys lose magnesium. This is called familial or primary hypomagnesemia.  Some medications such as diuretics also cause the loss of magnesium. SYMPTOMS  These following problems are worse if the changes in magnesium levels come on suddenly.  Tremor.  Confusion.  Muscle weakness.  Oversensitive to sights and sounds.  Sensitive reflexes.  Depression.  Muscular  fibrillations.  Overreactivity of the nerves.  Irritability.  Psychosis.  Spasms of the hand muscles.  Tetany (where the muscles go into uncontrollable spasms). DIAGNOSIS  This condition can be diagnosed by blood tests. TREATMENT   In an emergency, magnesium can be given intravenously (by vein).  If the condition is less worrisome, it can be corrected by diet. High levels of magnesium are found in green leafy vegetables, peas, beans, and nuts among other things. It can also be given through medications by mouth.  If it is being caused by medications, changes can be made.  If alcohol is a problem, help is available if there are difficulties giving it up. Document Released: 09/02/2005 Document Revised: 04/23/2014 Document Reviewed: 07/27/2008 [facility] Patient Information 2015 [place], [place]. This information is not intended to replace advice given to you by your health care provider. Make sure you discuss any questions you have with your health care provider.

## 2014-11-09 NOTE — Progress Notes (Signed)
   Subjective:    Patient ID: Jack Wallace, male    DOB: 1961-04-05, 53 y.o.   MRN: [id number]  HPI Jack Wallace is a 53 year old man with history of ischemic colitis, hypomagnesemia, and DM2 with peripheral neuropathy presenting for routine follow up.  He reports some dizziness and nausea last week that has resolved after taking Bentyl and simethicone.  He report feeling okay right now, and is taking his GI meds intermittently when needed. He denies any diarrhea currently.  He follows with GI who feels that his ischemic bowel issues have resolved and his symptoms are most consistent irritable bowel syndrome.  He has been taking artificial saliva and sugar free gum to help with dry mouth, which has been helping significantly.  In addition, he has been avoiding Bentyl unless needed for GI upset.  He had labs drawn at work on 10/17/14, which were normal except Mg of 1.3. His Hgb A1C was 6.3.  He follows with an endocrinologist who he will see next week who will perform his foot exam.   Review of Systems  Constitutional: Negative for fever, chills and diaphoresis.  HENT: Negative for congestion and rhinorrhea.   Respiratory: Negative for cough, chest tightness, shortness of breath and wheezing.   Cardiovascular: Negative for chest pain and palpitations.  Gastrointestinal: Negative for nausea, vomiting, diarrhea, constipation, blood in stool and abdominal distention.  Genitourinary: Negative for dysuria and difficulty urinating.  Musculoskeletal: Negative for myalgias and arthralgias.  Skin: Negative for rash.  Neurological: Negative for dizziness, weakness, numbness and headaches.       Objective:   Physical Exam  Constitutional: He is oriented to person, place, and time. He appears well-developed and well-nourished. No distress.  HENT:  Head: Normocephalic and atraumatic.  Eyes: Conjunctivae and EOM are normal. Pupils are equal, round, and reactive to light. No scleral icterus.  Neck:  Normal range of motion. Neck supple.  Cardiovascular: Normal rate, regular rhythm and normal heart sounds.   Pulmonary/Chest: Effort normal and breath sounds normal.  Abdominal: Soft. Bowel sounds are normal. He exhibits no distension. There is no tenderness.  Musculoskeletal: Normal range of motion. He exhibits no edema or tenderness.  Neurological: He is alert and oriented to person, place, and time. No cranial nerve deficit. He exhibits normal muscle tone.  Skin: Skin is warm and dry. No rash noted. No erythema.          Assessment & Plan:  Please see problem-based assessment and plan.

## 2014-11-10 NOTE — Assessment & Plan Note (Signed)
Lab Results  Component Value Date   HGBA1C 6.5* 05/08/2014     Assessment: Diabetes control: good control (HgbA1C at goal) Progress toward A1C goal:  at goal Comments: Excellent blood sugar control on glucometer readings. A1C 6.3.  Plan: Medications:  continue current medications: liraglutide 1.2 mg daily, metformin 1000 mg BID Home glucose monitoring: Frequency:  Daily Timing:  Before breakfast Instruction/counseling given: reminded to get eye exam, reminded to bring blood glucose meter & log to each visit, reminded to bring medications to each visit, discussed foot care, discussed the need for weight loss and discussed diet Other plans: Will see endocrinologist for foot exam next week, referred to ophthalmologist for eye exam, ordered urine microalbumin but patient could not void.

## 2014-11-10 NOTE — Assessment & Plan Note (Signed)
Gave Tdap today.

## 2014-11-10 NOTE — Assessment & Plan Note (Signed)
First noted in Spring of 2015.  Asymptomatic, and likely chronic.  Possibly genetic from renal wasting with no obvious offending medications.  Other electrolytes normal.  Oral supplementation appears to be working.  No further work-up unless worsening or symptomatic. -Continue Mg oxide 400 mg daily.

## 2014-11-10 NOTE — Assessment & Plan Note (Signed)
Resolved.  Says sugar free gum works best for him. -Continue artificial saliva and sugar free gum. -Avoid Bentyl unless needed for IBS.

## 2014-11-10 NOTE — Assessment & Plan Note (Signed)
Has resolved.  Following with GI who feels symptoms are due to irritable bowel syndrome.  Has intermittent symptoms that resolve and are responsive to medications. -Continue Bentyl and simethicone PRN for abdominal symptoms.

## 2014-11-12 ENCOUNTER — Encounter: Payer: Self-pay | Admitting: Endocrinology

## 2014-11-12 ENCOUNTER — Ambulatory Visit (INDEPENDENT_AMBULATORY_CARE_PROVIDER_SITE_OTHER): Payer: BC Managed Care – PPO | Admitting: Endocrinology

## 2014-11-12 VITALS — BP 108/76 | HR 89 | Temp 97.7°F | Resp 16 | Ht 73.0 in | Wt 276.2 lb

## 2014-11-12 DIAGNOSIS — E1142 Type 2 diabetes mellitus with diabetic polyneuropathy: Secondary | ICD-10-CM

## 2014-11-12 DIAGNOSIS — G629 Polyneuropathy, unspecified: Secondary | ICD-10-CM

## 2014-11-12 DIAGNOSIS — E119 Type 2 diabetes mellitus without complications: Secondary | ICD-10-CM

## 2014-11-12 NOTE — Progress Notes (Signed)
Internal Medicine Clinic Attending  Case discussed with Dr. Moding at the time of the visit.  We reviewed the resident's history and exam and pertinent patient test results.  I agree with the assessment, diagnosis, and plan of care documented in the resident's note. 

## 2014-11-12 NOTE — Patient Instructions (Addendum)
Metformin 1 in am and 3 at dinner time  More sugar after dinner  Have A1c done in 3 months

## 2014-11-12 NOTE — Progress Notes (Signed)
Patient ID: [name], male   DOB: 12/17/1961, 53 y.o.   MRN: [id number]   Reason for Appointment : Followup of Type 2 Diabetes   History of Present Illness            Diagnosis: Type 2 diabetes mellitus, date of diagnosis: 1993         Past history: He has been treated with various drugs for his diabetes over the last several years. He has been taking metformin for at least 10 years and has been tolerating this. Over the years he has had additional medications to improve his control. He was also taking Actos previously and not clear if he had any side effects. This was stopped because of fear of long-term effects The last 5 years he was on glipizide also. Previously was taking 10 mg twice a day. About 2 years ago his blood sugars were poorly controlled with readings up to 300 In early 2014 he started walking and lost 15 pounds. Subsequently he was getting low blood sugar during the day especially before lunch and sometimes in the afternoon His glipizide was  reduced but even with 5 mg twice a day he was getting hypoglycemia. This was reduced and Invokana added. Invokana was stopped because of lack of clear benefit in 09/2013  Because of inadequate control he was started on Victoza on his  visit in 10/14 in addition to his metformin and glipizide  Recent history:  With Victoza his blood sugars have been fairly consistently controlled and A1c has been 7% or below This recent A1c is down to 6.3 This is despite his fasting blood sugars being somewhat higher than before, averaging about 137, previously 116 Although he has not checked his readings after his evening meal his readings after breakfast and lunch are usually better Although he is trying to be somewhat active he has difficulty losing weight At one point was taking glipizide but this was causing hypoglycemia before lunch  Oral hypoglycemic drugs: Metformin 2 g  Monitors blood glucose:  1.6 times a day        Glucometer:  One Touch  Verio Blood Glucose readings from meter download:   PREMEAL Breakfast  10 AM-1P  3 PM-7 P Bedtime Overall  Glucose range:  107-184   85-158   88-132     Median:  137    112    129    The diet that the patient has been following CB:[id number] to limit carbohydrates. Meals: 2 meals per day.  usually eating a frozen meal at breakfast 9 am and supper 6 pm and only crackers at lunch. Will have more snacks  sometimes in the evenings      Dietician visit: Most recent:1993-95   Wt Readings from Last 3 Encounters:  11/12/14 276 lb 3.2 oz (125.283 kg)  11/09/14 275 lb (124.739 kg)  08/23/14 271 lb 12.8 oz (123.288 kg)     Hypoglycemia : None recently   Physical activity: exercise: No specific exercise,  active at work, walking to work and to the drug store which may be 20 minutes each way         His last A1c was 6.3% on 10/17/14 Previously 7% on 08/06/14   Lab Results  Component Value Date   HGBA1C 6.5* 05/08/2014   Retinal exam: Most recent:. 7/13    Medication List       This list is accurate as of: 11/12/14  4:27 PM.  Always use your most recent med  list.               Alpha Lipoic Acid 200 MG Caps  Take 200 mg by mouth. 2 pills daily     aspirin 81 MG tablet  Take 81 mg by mouth daily.     BIOTENE ORALBALANCE DRY MOUTH Gel  - Apply 1/2 inch length onto tongue and spread evenly repeat as often as needed  - Generic okay if available     dicyclomine 20 MG tablet  Commonly known as:  BENTYL  Take 1 tab twice daily     gabapentin 600 MG tablet  Commonly known as:  NEURONTIN  Take 1 tablet by mouth 3 times daily     glucose blood test strip  Commonly known as:  ONETOUCH VERIO  Use to check blood sugar 2 times per day dx code E11.65     Liraglutide 18 MG/3ML Sopn  Inject 1.2 mg into the skin daily.     Magnesium Oxide 400 (240 MG) MG Tabs  Take one tablet in the morning and one-and-half tablet in the evening.     MAGNESIUM-OXIDE 400 (241.3 MG) MG tablet  Generic  drug:  magnesium oxide     metFORMIN 500 MG tablet  Commonly known as:  GLUCOPHAGE  Take 2 tablets (1,000 mg total) by mouth 2 (two) times daily with a meal.     NOVOTWIST 32G X 5 MM Misc  Generic drug:  Insulin Pen Needle     ONETOUCH DELICA LANCETS FINE Misc     pantoprazole 40 MG tablet  Commonly known as:  PROTONIX  Take 1 tablet (40 mg total) by mouth daily.     pravastatin 40 MG tablet  Commonly known as:  PRAVACHOL  Take 40 mg by mouth every morning.     RESTORA Caps  Take 1 capsule by mouth daily.     simethicone 125 MG chewable tablet  Commonly known as:  MYLICON  Chew 161 mg by mouth every 6 (six) hours as needed for flatulence.     VITAMIN D PO  Take 1 tablet by mouth daily. D3 1000 IU     VITAMIN E PO  Take 1 capsule by mouth daily. 400 IU daily        Allergies: No Known Allergies  Past Medical History  Diagnosis Date  . Diabetes mellitus without complication   . Hypertension   . Hyperlipidemia   . Ischemic colitis     Past Surgical History  Procedure Laterality Date  . Nasal septum surgery    . Colonoscopy  06/01/2014    Family History  Problem Relation Age of Onset  . Hypertension Mother   . Diabetes Maternal Grandfather   . Colon cancer Neg Hx   . Throat cancer Neg Hx   . Prostate cancer Neg Hx   . Pancreatic cancer Neg Hx   . Heart disease Neg Hx   . Kidney disease Neg Hx   . Liver disease Neg Hx     Social History:  reports that he has never smoked. He has never used smokeless tobacco. He reports that he does not drink alcohol or use illicit drugs.    Review of Systems      Hyperlipidemia: taking pravastatin  40 mg for several years. .Lipids show LDL 93 with HDL 45 and triglycerides 138 done on 10/17/14     The blood pressure has been controlled without any medications recently.  Checking at home or work periodically   He  has chronic mild hypomagnesemia which is asymptomatic, no muscle cramps and is taking 1000 mg a day of  supplements   He has had symptoms of neuropathy getting relief with gabapentin 600 mg  Foot exam last: 3/15  Physical Examination:  BP 108/76 mmHg  Pulse 89  Temp(Src) 97.7 F (36.5 C)  Resp 16  Ht 6\' 1"  (1.854 m)  Wt 276 lb 3.2 oz (125.283 kg)  BMI 36.45 kg/m2  SpO2 95%  No  edema   ASSESSMENT/PLAN:   Diabetes type 2:  Blood sugars are well controlled although relatively higher fasting blood sugars and somewhat higher than the last visit Although his home blood sugars are averaging only about 129 at home his A1c is improved at 6.3% Again has not checked readings after his evening meal as directed Weight is relatively stable and continues to do well with Victoza without side effects For now he will try to take his metformin one tablet in the morning and 3 at suppertime to see fasting readings are better To start checking more readings after supper   Aquila Menzie 11/12/2014, 4:27 PM

## 2014-12-10 ENCOUNTER — Encounter: Payer: Self-pay | Admitting: Endocrinology

## 2014-12-10 ENCOUNTER — Other Ambulatory Visit: Payer: Self-pay | Admitting: *Deleted

## 2014-12-11 MED ORDER — MAGNESIUM OXIDE -MG SUPPLEMENT 400 (240 MG) MG PO TABS: ORAL_TABLET | ORAL | Status: DC

## 2014-12-27 ENCOUNTER — Other Ambulatory Visit: Payer: Self-pay | Admitting: *Deleted

## 2014-12-27 MED ORDER — INSULIN PEN NEEDLE 32G X 5 MM MISC: Status: DC

## 2015-01-08 ENCOUNTER — Other Ambulatory Visit: Payer: Self-pay | Admitting: *Deleted

## 2015-01-08 MED ORDER — GABAPENTIN 600 MG PO TABS: ORAL_TABLET | ORAL | Status: DC

## 2015-01-08 MED ORDER — LIRAGLUTIDE 18 MG/3ML ~~LOC~~ SOPN: 1.2000 mg | PEN_INJECTOR | Freq: Every day | SUBCUTANEOUS | Status: DC

## 2015-01-17 LAB — HM DIABETES EYE EXAM

## 2015-01-29 ENCOUNTER — Encounter: Payer: Self-pay | Admitting: Gastroenterology

## 2015-01-29 ENCOUNTER — Ambulatory Visit (INDEPENDENT_AMBULATORY_CARE_PROVIDER_SITE_OTHER): Payer: BLUE CROSS/BLUE SHIELD | Admitting: Gastroenterology

## 2015-01-29 VITALS — BP 110/80 | HR 108 | Ht 71.75 in | Wt 272.1 lb

## 2015-01-29 DIAGNOSIS — R1314 Dysphagia, pharyngoesophageal phase: Secondary | ICD-10-CM

## 2015-01-29 DIAGNOSIS — K219 Gastro-esophageal reflux disease without esophagitis: Secondary | ICD-10-CM

## 2015-01-29 DIAGNOSIS — K5900 Constipation, unspecified: Secondary | ICD-10-CM

## 2015-01-29 NOTE — Progress Notes (Signed)
History of Present Illness: This is a 54 year old male with mild constipation and bloating. He complains of solid food dysphagia occurring intermittently for the past month. He has chronic GERD heartburn symptoms well controlled on daily pantoprazole. IBS symptoms are fairly well controlled on dicyclomine twice a day as needed.  No Known Allergies Outpatient Prescriptions Prior to Visit  Medication Sig Dispense Refill  . Alpha Lipoic Acid 200 MG CAPS Take 200 mg by mouth. 2 pills daily    . Artificial Saliva (BIOTENE ORALBALANCE DRY MOUTH) GEL Apply 1/2 inch length onto tongue and spread evenly repeat as often as needed Generic okay if available 42 g 2  . aspirin 81 MG tablet Take 81 mg by mouth daily.    . Cholecalciferol (VITAMIN D PO) Take 1 tablet by mouth daily. D3 1000 IU    . dicyclomine (BENTYL) 20 MG tablet Take 1 tab twice daily (Patient taking differently: Take 20 mg by mouth. Take 1 tab twice daily only takes if stomach is hurting) 60 tablet 3  . gabapentin (NEURONTIN) 600 MG tablet Take 1 tablet by mouth 3 times daily 90 tablet 3  . glucose blood (ONETOUCH VERIO) test strip Use to check blood sugar 2 times per day dx code E11.65 100 each 3  . Insulin Pen Needle (NOVOTWIST) 32G X 5 MM MISC Use one per day 100 each 1  . Liraglutide 18 MG/3ML SOPN Inject 0.2 mLs (1.2 mg total) into the skin daily. 18 mL 1  . Magnesium Oxide 400 (240 MG) MG TABS Take one tablet in the morning and one-and-half tablet in the evening. 75 tablet 2  . metFORMIN (GLUCOPHAGE) 500 MG tablet Take 2 tablets (1,000 mg total) by mouth 2 (two) times daily with a meal. 120 tablet 3  . ONETOUCH DELICA LANCETS FINE MISC     . pantoprazole (PROTONIX) 40 MG tablet Take 1 tablet (40 mg total) by mouth daily. 30 tablet 5  . pravastatin (PRAVACHOL) 40 MG tablet Take 40 mg by mouth every morning.     . Probiotic Product (RESTORA) CAPS Take 1 capsule by mouth daily. 6 capsule 0  . simethicone (MYLICON) 295 MG chewable  tablet Chew 125 mg by mouth every 6 (six) hours as needed for flatulence.    Marland Kitchen VITAMIN E PO Take 1 capsule by mouth daily. 400 IU daily     No facility-administered medications prior to visit.   Past Medical History  Diagnosis Date  . Diabetes mellitus without complication   . Hypertension   . Hyperlipidemia   . Ischemic colitis    Past Surgical History  Procedure Laterality Date  . Nasal septum surgery    . Colonoscopy  06/01/2014   History   Social History  . Marital Status: Married    Spouse Name: N/A    Number of Children: N/A  . Years of Education: N/A   Social History Main Topics  . Smoking status: Never Smoker   . Smokeless tobacco: Never Used  . Alcohol Use: No  . Drug Use: No  . Sexual Activity: None   Other Topics Concern  . None   Social History Narrative   Family History  Problem Relation Age of Onset  . Hypertension Mother   . Diabetes Maternal Grandfather   . Colon cancer Neg Hx   . Throat cancer Neg Hx   . Prostate cancer Neg Hx   . Pancreatic cancer Neg Hx   . Heart disease Neg Hx   .  Kidney disease Neg Hx   . Liver disease Neg Hx       Physical Exam: General: Well developed , well nourished, no acute distress Head: Normocephalic and atraumatic Eyes:  sclerae anicteric, EOMI Ears: Normal auditory acuity Mouth: No deformity or lesions Lungs: Clear throughout to auscultation Heart: Regular rate and rhythm; no murmurs, rubs or bruits Abdomen: Soft, non tender and non distended. No masses, hepatosplenomegaly or hernias noted. Normal Bowel sounds Musculoskeletal: Symmetrical with no gross deformities  Pulses:  Normal pulses noted Extremities: No clubbing, cyanosis, edema or deformities noted Neurological: Alert oriented x 4, grossly nonfocal Psychological:  Alert and cooperative. Normal mood and affect  Assessment and Recommendations:  1. New onset solid dysphagia and GERD. Rule out esophageal stricture, esophagitis, neoplasm. Continue  pantoprazole 40 mg daily and standard antireflux measures. The risks, benefits, and alternatives to endoscopy with possible biopsy and possible dilation were discussed with the patient and they consent to proceed.   2. IBS-C. Increase dietary fiber and water intake. Continue dicyclomine twice daily as needed.  3. History of ischemic colitis, resolved. Adequate hydration long-term.

## 2015-01-29 NOTE — Patient Instructions (Signed)
You have been scheduled for an endoscopy. Please follow written instructions given to you at your visit today. If you use inhalers (even only as needed), please bring them with you on the day of your procedure. Your physician has requested that you go to www.startemmi.com and enter the access code given to you at your visit today. This web site gives a general overview about your procedure. However, you should still follow specific instructions given to you by our office regarding your preparation for the procedure.  Patient advised to avoid spicy, acidic, citrus, chocolate, mints, fruit and fruit juices.  Limit the intake of caffeine, alcohol and Soda.  Don't exercise too soon after eating.  Don't lie down within 3-4 hours of eating.  Elevate the head of your bed.  High-Fiber Diet Fiber is found in fruits, vegetables, and grains. A high-fiber diet encourages the addition of more whole grains, legumes, fruits, and vegetables in your diet. The recommended amount of fiber for adult males is 38 g per day. For adult females, it is 25 g per day. Pregnant and lactating women should get 28 g of fiber per day. If you have a digestive or bowel problem, ask your caregiver for advice before adding high-fiber foods to your diet. Eat a variety of high-fiber foods instead of only a select few type of foods.  PURPOSE  To increase stool bulk.  To make bowel movements more regular to prevent constipation.  To lower cholesterol.  To prevent overeating. WHEN IS THIS DIET USED?  It may be used if you have constipation and hemorrhoids.  It may be used if you have uncomplicated diverticulosis (intestine condition) and irritable bowel syndrome.  It may be used if you need help with weight management.  It may be used if you want to add it to your diet as a protective measure against atherosclerosis, diabetes, and cancer. SOURCES OF FIBER  Whole-grain breads and cereals.  Fruits, such as apples, oranges,  bananas, berries, prunes, and pears.  Vegetables, such as green peas, carrots, sweet potatoes, beets, broccoli, cabbage, spinach, and artichokes.  Legumes, such split peas, soy, lentils.  Almonds. FIBER CONTENT IN FOODS Starches and Grains / Dietary Fiber (g)  Cheerios, 1 cup / 3 g  Corn Flakes cereal, 1 cup / 0.7 g  Rice crispy treat cereal, 1 cup / 0.3 g  Instant oatmeal (cooked),  cup / 2 g  Frosted wheat cereal, 1 cup / 5.1 g  Brown, long-grain rice (cooked), 1 cup / 3.5 g  White, long-grain rice (cooked), 1 cup / 0.6 g  Enriched macaroni (cooked), 1 cup / 2.5 g Legumes / Dietary Fiber (g)  Baked beans (canned, plain, or vegetarian),  cup / 5.2 g  Kidney beans (canned),  cup / 6.8 g  Pinto beans (cooked),  cup / 5.5 g Breads and Crackers / Dietary Fiber (g)  Plain or honey graham crackers, 2 squares / 0.7 g  Saltine crackers, 3 squares / 0.3 g  Plain, salted pretzels, 10 pieces / 1.8 g  Whole-wheat bread, 1 slice / 1.9 g  White bread, 1 slice / 0.7 g  Raisin bread, 1 slice / 1.2 g  Plain bagel, 3 oz / 2 g  Flour tortilla, 1 oz / 0.9 g  Corn tortilla, 1 small / 1.5 g  Hamburger or hotdog bun, 1 small / 0.9 g Fruits / Dietary Fiber (g)  Apple with skin, 1 medium / 4.4 g  Sweetened applesauce,  cup / 1.5 g  Banana,  medium / 1.5 g  Grapes, 10 grapes / 0.4 g  Orange, 1 small / 2.3 g  Raisin, 1.5 oz / 1.6 g  Melon, 1 cup / 1.4 g Vegetables / Dietary Fiber (g)  Green beans (canned),  cup / 1.3 g  Carrots (cooked),  cup / 2.3 g  Broccoli (cooked),  cup / 2.8 g  Peas (cooked),  cup / 4.4 g  Mashed potatoes,  cup / 1.6 g  Lettuce, 1 cup / 0.5 g  Corn (canned),  cup / 1.6 g  Tomato,  cup / 1.1 g Document Released: 12/07/2005 Document Revised: 06/07/2012 Document Reviewed: 03/10/2012 [employer] Patient Information 2015 [place], [place]. This information is not intended to replace advice given to you by your health care  provider. Make sure you discuss any questions you have with your health care provider.  Thank you for choosing me and [facility].  Pricilla Riffle. Dagoberto Ligas., MD., Marval Regal  cc: Arman Filter, MD

## 2015-02-08 ENCOUNTER — Other Ambulatory Visit: Payer: Self-pay | Admitting: *Deleted

## 2015-02-08 MED ORDER — METFORMIN HCL 500 MG PO TABS: 1000.0000 mg | ORAL_TABLET | Freq: Two times a day (BID) | ORAL | Status: DC

## 2015-02-13 ENCOUNTER — Encounter: Payer: Self-pay | Admitting: Endocrinology

## 2015-02-13 ENCOUNTER — Ambulatory Visit (INDEPENDENT_AMBULATORY_CARE_PROVIDER_SITE_OTHER): Payer: BLUE CROSS/BLUE SHIELD | Admitting: Endocrinology

## 2015-02-13 DIAGNOSIS — E119 Type 2 diabetes mellitus without complications: Secondary | ICD-10-CM

## 2015-02-13 NOTE — Patient Instructions (Addendum)
Please check blood sugars at least half the time about 2 hours after DINNER and 3-4 times per week on waking up.   Please bring blood sugar monitor to each visit. Recommended blood sugar levels about 2 hours after meal is 140-180 and on waking up 90-130  Add a  PROTEIN to bedtime snack

## 2015-02-13 NOTE — Progress Notes (Signed)
Patient ID: Jack Wallace, male   DOB: 12/03/1961, 54 y.o.   MRN: [id number]   Reason for Appointment : Followup of Type 2 Diabetes   History of Present Illness            Diagnosis: Type 2 diabetes mellitus, date of diagnosis: 1993         Past history: He has been treated with various drugs for his diabetes over the last several years. He has been taking metformin for at least 10 years and has been tolerating this. Over the years he has had additional medications to improve his control. He was also taking Actos previously and not clear if he had any side effects. This was stopped because of fear of long-term effects The last 5 years he was on glipizide also. Previously was taking 10 mg twice a day. About 2 years ago his blood sugars were poorly controlled with readings up to 300 In early 2014 he started walking and lost 15 pounds. Subsequently he was getting low blood sugar during the day especially before lunch and sometimes in the afternoon His glipizide was  reduced but even with 5 mg twice a day he was getting hypoglycemia. This was reduced and Invokana added. Invokana was stopped because of lack of clear benefit in 09/2013  Because of inadequate control he was started on Victoza on his  visit in 10/14 in addition to his metformin and glipizide. Glipizide was stopped because of tendency to hypoglycemia  Recent history:   With Victoza his blood sugars have been fairly consistently controlled and A1c has been 7% or below He was unable to get labs done recently for A1c On his last visit he was having overall high readings in the mornings when he was asked to change his metformin to 3 tablets in the evening and 1 in the morning. However his morning sugars are about the same overall with significant fluctuation He thinks his morning sugars may be higher if he has a bedtime snack which maybe cereal or fruit. Again he was asked to check his blood sugars after supper but he still  does not do any Blood sugars are generally fairly good midday and early afternoon Although he is trying to be  active he has difficulty losing weight  Oral hypoglycemic drugs: Metformin 2 g  Monitors blood glucose:  1.8 times a day        Glucometer:  One Touch Verio Blood Glucose readings from meter download:   PRE-MEAL Breakfast Lunch  3-6 PM  Bedtime Overall  Glucose range:  101-194    90-195     Mean/median:  139    120    127   The diet that the patient has been following [id number] to limit carbohydrates. Meals: 2 meals per day.  usually eating a frozen meal at breakfast 9 am and supper 6 pm and only crackers at lunch. Will have more snacks  sometimes after supper Dietician visit: Most recent:?  Wt Readings from Last 3 Encounters:  02/13/15 270 lb (122.471 kg)  01/29/15 272 lb 2 oz (123.435 kg)  11/12/14 276 lb 3.2 oz (125.283 kg)     Hypoglycemia : None recently   Physical activity: exercise: No specific exercise,  active at work, walking to work and to the drug store which may be 20 minutes each way         His last A1c was 6.3% on 10/17/14 Previously 7% on 08/06/14   Lab Results  Component Value Date   HGBA1C 6.5* 05/08/2014   Retinal exam: Most recent:. 7/13    Medication List       This list is accurate as of: 02/13/15  4:53 PM.  Always use your most recent med list.               Alpha Lipoic Acid 200 MG Caps  Take 200 mg by mouth. 2 pills daily     aspirin 81 MG tablet  Take 81 mg by mouth daily.     BIOTENE ORALBALANCE DRY MOUTH Gel  - Apply 1/2 inch length onto tongue and spread evenly repeat as often as needed  - Generic okay if available     dicyclomine 20 MG tablet  Commonly known as:  BENTYL  Take 1 tab twice daily     docusate sodium 100 MG capsule  Commonly known as:  COLACE  Take 100 mg by mouth daily.     gabapentin 600 MG tablet  Commonly known as:  NEURONTIN  Take 1 tablet by mouth 3 times daily     glucose blood test strip    Commonly known as:  ONETOUCH VERIO  Use to check blood sugar 2 times per day dx code E11.65     Insulin Pen Needle 32G X 5 MM Misc  Commonly known as:  NOVOTWIST  Use one per day     Liraglutide 18 MG/3ML Sopn  Inject 0.2 mLs (1.2 mg total) into the skin daily.     Magnesium Oxide 400 (240 MG) MG Tabs  Take one tablet in the morning and one-and-half tablet in the evening.     metFORMIN 500 MG tablet  Commonly known as:  GLUCOPHAGE  Take 2 tablets (1,000 mg total) by mouth 2 (two) times daily with a meal.     ONETOUCH DELICA LANCETS FINE Misc     pantoprazole 40 MG tablet  Commonly known as:  PROTONIX  Take 1 tablet (40 mg total) by mouth daily.     pravastatin 40 MG tablet  Commonly known as:  PRAVACHOL  Take 40 mg by mouth every morning.     RESTORA Caps  Take 1 capsule by mouth daily.     simethicone 125 MG chewable tablet  Commonly known as:  MYLICON  Chew 597 mg by mouth every 6 (six) hours as needed for flatulence.     VITAMIN D PO  Take 1 tablet by mouth daily. D3 1000 IU     VITAMIN E PO  Take 1 capsule by mouth daily. 400 IU daily        Allergies: No Known Allergies  Past Medical History  Diagnosis Date  . Diabetes mellitus without complication   . Hypertension   . Hyperlipidemia   . Ischemic colitis     Past Surgical History  Procedure Laterality Date  . Nasal septum surgery    . Colonoscopy  06/01/2014    Family History  Problem Relation Age of Onset  . Hypertension Mother   . Diabetes Maternal Grandfather   . Colon cancer Neg Hx   . Throat cancer Neg Hx   . Prostate cancer Neg Hx   . Pancreatic cancer Neg Hx   . Heart disease Neg Hx   . Kidney disease Neg Hx   . Liver disease Neg Hx     Social History:  reports that he has never smoked. He has never used smokeless tobacco. He reports that he does not drink alcohol or use  illicit drugs.    Review of Systems      Hyperlipidemia: taking pravastatin  40 mg for several  years. .Lipids  show LDL 93 with HDL 45 and triglycerides 138 done on 10/17/14     The blood pressure has been controlled without any medications.  Checking at home or work periodically   He has chronic mild hypomagnesemia which is asymptomatic, no muscle cramps and is taking 1000 mg a day of supplements   Recently having problems with constipation  He has had symptoms of neuropathy getting relief with gabapentin 600 mg  Foot exam last: 3/15  Physical Examination:  BP 130/86 mmHg  Pulse 74  Temp(Src) 98.7 F (37.1 C) (Oral)  Ht 5' 11.5" (1.816 m)  Wt 270 lb (122.471 kg)  BMI 37.14 kg/m2  SpO2 94%  No  edema   ASSESSMENT/PLAN:   Diabetes type 2:  Blood sugars are well controlled although still has overall higher fasting blood sugars A1c not available today He does need to check readings after her evening meal which he is not doing despite reminders. He does walk fairly regularly but has difficulty losing much weight Weight is relatively stable and continues to do well with Victoza without side effects For now he will continue to take his metformin one tablet in the morning and 3 at suppertime   No change in medication regimen unless his A1c is higher today  Will continue to monitor blood pressure  Karle Desrosier 02/13/2015, 4:53 PM

## 2015-02-14 LAB — BASIC METABOLIC PANEL
BUN: 20 mg/dL (ref 6–23)
CALCIUM: 9.6 mg/dL (ref 8.4–10.5)
CHLORIDE: 104 meq/L (ref 96–112)
CO2: 27 mEq/L (ref 19–32)
Creatinine, Ser: 1.08 mg/dL (ref 0.40–1.50)
GFR: 75.61 mL/min (ref >60.00)
Glucose, Bld: 105 mg/dL — ABNORMAL HIGH (ref 70–99)
Potassium: 4.3 mEq/L (ref 3.5–5.1)
Sodium: 138 mEq/L (ref 135–145)

## 2015-02-14 LAB — HEMOGLOBIN A1C: Hgb A1c MFr Bld: 6.5 % (ref 4.6–6.5)

## 2015-02-14 NOTE — Progress Notes (Signed)
Quick Note:  Please let patient know that the lab result is good and no further action needed ______

## 2015-03-04 ENCOUNTER — Encounter: Payer: BLUE CROSS/BLUE SHIELD | Admitting: Gastroenterology

## 2015-03-08 ENCOUNTER — Other Ambulatory Visit: Payer: Self-pay | Admitting: Internal Medicine

## 2015-03-08 ENCOUNTER — Encounter: Payer: Self-pay | Admitting: Internal Medicine

## 2015-03-25 ENCOUNTER — Other Ambulatory Visit: Payer: Self-pay

## 2015-03-25 MED ORDER — DICYCLOMINE HCL 20 MG PO TABS: ORAL_TABLET | ORAL | Status: DC

## 2015-03-29 ENCOUNTER — Ambulatory Visit (INDEPENDENT_AMBULATORY_CARE_PROVIDER_SITE_OTHER): Payer: BLUE CROSS/BLUE SHIELD | Admitting: Internal Medicine

## 2015-03-29 ENCOUNTER — Encounter: Payer: Self-pay | Admitting: Internal Medicine

## 2015-03-29 VITALS — BP 127/79 | HR 73 | Temp 98.0°F | Ht 72.0 in | Wt 271.8 lb

## 2015-03-29 DIAGNOSIS — R1310 Dysphagia, unspecified: Secondary | ICD-10-CM | POA: Insufficient documentation

## 2015-03-29 DIAGNOSIS — I10 Essential (primary) hypertension: Secondary | ICD-10-CM | POA: Diagnosis not present

## 2015-03-29 DIAGNOSIS — E1142 Type 2 diabetes mellitus with diabetic polyneuropathy: Secondary | ICD-10-CM | POA: Diagnosis not present

## 2015-03-29 DIAGNOSIS — R110 Nausea: Secondary | ICD-10-CM | POA: Insufficient documentation

## 2015-03-29 DIAGNOSIS — K589 Irritable bowel syndrome without diarrhea: Secondary | ICD-10-CM | POA: Diagnosis not present

## 2015-03-29 LAB — BASIC METABOLIC PANEL WITH GFR
BUN: 13 mg/dL (ref 6–23)
CALCIUM: 9.6 mg/dL (ref 8.4–10.5)
CO2: 28 meq/L (ref 19–32)
Chloride: 101 mEq/L (ref 96–112)
Creat: 0.89 mg/dL (ref 0.50–1.35)
GFR, Est Non African American: >89 mL/min
GLUCOSE: 81 mg/dL (ref 70–99)
POTASSIUM: 4 meq/L (ref 3.5–5.3)
Sodium: 138 mEq/L (ref 135–145)

## 2015-03-29 LAB — GLUCOSE, CAPILLARY: GLUCOSE-CAPILLARY: 88 mg/dL (ref 70–99)

## 2015-03-29 LAB — MAGNESIUM: MAGNESIUM: 1.3 mg/dL — AB (ref 1.5–2.5)

## 2015-03-29 MED ORDER — METFORMIN HCL 500 MG PO TABS: 500.0000 mg | ORAL_TABLET | Freq: Two times a day (BID) | ORAL | Status: DC

## 2015-03-29 NOTE — Assessment & Plan Note (Addendum)
History of GERD, and is having some dysphagia with solid foods. He was scheduled for EGD by his GI doctor, but he had to reschedule. I agree that an EGD would be helpful to rule out stricture or malignancy. -Continue to follow-up with GI. -Encouraged patient to reschedule and complete EGD. -Continue Protonix 40 mg daily.

## 2015-03-29 NOTE — Assessment & Plan Note (Signed)
BP Readings from Last 3 Encounters:  03/29/15 127/79  02/13/15 130/86  01/29/15 110/80    Lab Results  Component Value Date   NA 138 02/13/2015   K 4.3 02/13/2015   CREATININE 1.08 02/13/2015    Assessment: Blood pressure control: controlled Progress toward BP goal:  at goal  Plan: Medications:  No medications.

## 2015-03-29 NOTE — Assessment & Plan Note (Addendum)
He continues to take magnesium supplementation. No symptoms of hyper or hypomagnesemia. Intermittent diarrhea could be contributing. -Continue magnesium oxide 400 mg in the morning and 200 mg in the evening. -Check magnesium and BMP today.  --Addendum-- Jack Filter, MD, PhD Internal Medicine Intern Pager: [phone] 03/31/2015,11:59 AM  Magnesium noted to be low at 1.2. He was previously on up to 1000 mg per day, and he is currently taking 600 mg daily. -Increase magnesium oxide to 400 mg twice a day. -Called patient and left a message asking him to increase his magnesium. Let him know that a prescription had been sent to his pharmacy.

## 2015-03-29 NOTE — Progress Notes (Signed)
   Subjective:    Patient ID: Jack Wallace, male    DOB: [birth year], 54 y.o.   MRN: [id number]  HPI Jack Wallace is a 54 year old man with history of ischemic colitis now resolved, irritable bowel syndrome, hypomagnesemia, GERD, and type 2 diabetes with peripheral neuropathy presenting for follow-up of his diabetes.  He saw his endocrinologist on 11/12/2014. He is told to continue his Victoza and changed his metformin from 2 tablets twice per day to 1 tablet in the morning and 3 at suppertime. He again saw endocrinology on 02/13/2015 with no changes in his medications. His hemoglobin A1c was 6.5 at that time.  He reports feeling as though he is having low blood sugars occasionally in the afternoons (approximately 1 time per week).  He checks his blood sugar during these incidents and it is usually in the 80s.  These episodes typically occur when he is more busy at work. He does report that he often has small lunches because he does not feel hungry.  He saw his gastroenterologist on 01/29/2015. At that time, he was complaining of some dysphagia with solid foods along with reflux symptoms. He was scheduled for an EGD and continued on pantoprazole 40 mg daily.  He was told to increase his dietary fiber and water intake to help with his IBS.  He reports continued occasional stomach pain that responds to Bentyl.  He says that he was scheduled for a colonoscopy, but he had to reschedule due to his ride.  Review of Systems  Constitutional: Negative for fever, chills and fatigue.  HENT: Negative for congestion, rhinorrhea and sore throat.   Respiratory: Negative for cough, shortness of breath and wheezing.   Cardiovascular: Negative for chest pain and palpitations.  Gastrointestinal: Positive for abdominal pain (Intermittent) and diarrhea (intermittently.). Negative for nausea, vomiting, constipation, blood in stool and abdominal distention.  Genitourinary: Negative for dysuria.  Musculoskeletal: Negative  for myalgias and arthralgias.  Skin: Negative for rash.  Neurological: Positive for dizziness (Occasional with lower blood sugars.). Negative for weakness and numbness.       Objective:   Physical Exam  Constitutional: He is oriented to person, place, and time. He appears well-developed and well-nourished. No distress.  Central obesity.  HENT:  Head: Normocephalic and atraumatic.  Mouth/Throat: No oropharyngeal exudate.  Eyes: Conjunctivae and EOM are normal. Pupils are equal, round, and reactive to light. No scleral icterus.  Neck: Normal range of motion. Neck supple.  Cardiovascular: Normal rate, regular rhythm and normal heart sounds.   Pulmonary/Chest: Effort normal and breath sounds normal. No respiratory distress.  Abdominal: Soft. Bowel sounds are normal. He exhibits no distension. There is no tenderness.  Musculoskeletal: Normal range of motion. He exhibits no edema or tenderness.  Neurological: He is alert and oriented to person, place, and time. He exhibits normal muscle tone.  Skin: Skin is warm and dry. No rash noted.          Assessment & Plan:  Please see problem-based assessment and plan.

## 2015-03-29 NOTE — Patient Instructions (Addendum)
Thank you for coming to clinic today Jack Wallace.  General instructions: -It is important for you to eat regular meals throughout the day to avoid low blood sugars. On days where you are more busy at work, bring a snack with you to eat in the afternoons to avoid low sugars. -Also, I would like you to reduce your metformin to 500 mg in the morning and 1000 mg in the evening. -Continue taking your magnesium supplementation, we'll check your level today. -Please make a follow up appointment to return to clinic in 3 months.  Please bring your medicines with you each time you come.   Medicines may be  Eye drops  Herbal   Vitamins  Pills  Seeing these help [place] take care of you.

## 2015-03-29 NOTE — Assessment & Plan Note (Signed)
His symptoms continue to be intermittent, but responding to treatment. I agree that his symptoms are most likely secondary to irritable bowel syndrome. -Continue Bentyl 20 mg twice a day. -Continue probiotic and simethicone.

## 2015-03-29 NOTE — Assessment & Plan Note (Addendum)
Lab Results  Component Value Date   HGBA1C 6.5 02/13/2015   HGBA1C 6.5* 05/08/2014     Assessment: Diabetes control: good control (HgbA1C at goal) Progress toward A1C goal:  at goal Comments: Patient reports occasional hypoglycemia symptoms in the afternoons. Likely secondary to poor oral intake associated with increased activity at work. Reviewed glucometer log with lowest readings in the 80s, average 125.  Plan: Medications:  Continue liraglutide 1.2 mg daily, reduce metformin to 500 mg in a.m., 1000 mg in p.m. Home glucose monitoring: Frequency: 4 times a day Timing: before meals, at bedtime Instruction/counseling given: reminded to get eye exam, reminded to bring blood glucose meter & log to each visit, reminded to bring medications to each visit, discussed foot care, discussed diet and discussed sick day management  Self management tools provided: copy of home glucose meter download Other plans: Discussed the importance of eating full meals and snacks throughout the day to avoid low blood sugars. Patient will bring a work snack to have on afternoons on busy days to avoid hypoglycemia.  He asked whether he could stop seeing Dr. Dwyane Dee for his diabetes.  I asked him to discuss with Dr. Dwyane Dee, but told him that we should be able to manage his medications in our clinic.

## 2015-03-30 LAB — MICROALBUMIN / CREATININE URINE RATIO: CREATININE, URINE: 15.2 mg/dL

## 2015-03-31 MED ORDER — MAGNESIUM OXIDE -MG SUPPLEMENT 400 (240 MG) MG PO TABS
400.0000 mg | ORAL_TABLET | Freq: Two times a day (BID) | ORAL | Status: DC
Start: 2015-03-31 — End: 2015-06-24

## 2015-03-31 NOTE — Addendum Note (Signed)
Addended by: Charlesetta Shanks on: 03/31/2015 12:00 PM   Modules accepted: Orders

## 2015-04-01 ENCOUNTER — Other Ambulatory Visit: Payer: Self-pay

## 2015-04-01 MED ORDER — LIRAGLUTIDE 18 MG/3ML ~~LOC~~ SOPN: 1.2000 mg | PEN_INJECTOR | Freq: Every day | SUBCUTANEOUS | Status: DC

## 2015-04-01 NOTE — Progress Notes (Signed)
Case discussed with Dr. Trudee Kuster at the time of the visit.  We reviewed the resident's history and exam and pertinent patient test results.  I agree with the assessment, diagnosis, and plan of care documented in the resident's note.

## 2015-04-05 ENCOUNTER — Other Ambulatory Visit: Payer: Self-pay | Admitting: Internal Medicine

## 2015-04-08 ENCOUNTER — Telehealth: Payer: Self-pay | Admitting: Endocrinology

## 2015-04-08 ENCOUNTER — Other Ambulatory Visit: Payer: Self-pay | Admitting: *Deleted

## 2015-04-08 MED ORDER — GLUCOSE BLOOD VI STRP: ORAL_STRIP | Status: DC

## 2015-04-08 NOTE — Telephone Encounter (Signed)
°  Patient need refill of test strips for one touch ultra verio.

## 2015-04-08 NOTE — Telephone Encounter (Signed)
rx sent

## 2015-04-19 ENCOUNTER — Encounter: Payer: Self-pay | Admitting: *Deleted

## 2015-05-02 ENCOUNTER — Other Ambulatory Visit: Payer: Self-pay | Admitting: *Deleted

## 2015-05-02 MED ORDER — GABAPENTIN 600 MG PO TABS: ORAL_TABLET | ORAL | Status: DC

## 2015-05-09 LAB — HEMOGLOBIN A1C: Hgb A1c MFr Bld: 6.6 % — AB (ref 4.0–6.0)

## 2015-05-09 LAB — LIPID PANEL: LDL Cholesterol: 86 mg/dL

## 2015-05-15 ENCOUNTER — Encounter: Payer: Self-pay | Admitting: Endocrinology

## 2015-05-15 ENCOUNTER — Ambulatory Visit (INDEPENDENT_AMBULATORY_CARE_PROVIDER_SITE_OTHER): Payer: BLUE CROSS/BLUE SHIELD | Admitting: Endocrinology

## 2015-05-15 VITALS — BP 144/100 | HR 79 | Temp 98.4°F | Resp 16 | Ht 72.0 in | Wt 265.4 lb

## 2015-05-15 DIAGNOSIS — E7800 Pure hypercholesterolemia, unspecified: Secondary | ICD-10-CM

## 2015-05-15 DIAGNOSIS — E1142 Type 2 diabetes mellitus with diabetic polyneuropathy: Secondary | ICD-10-CM | POA: Diagnosis not present

## 2015-05-15 DIAGNOSIS — R110 Nausea: Secondary | ICD-10-CM | POA: Diagnosis not present

## 2015-05-15 DIAGNOSIS — I10 Essential (primary) hypertension: Secondary | ICD-10-CM | POA: Diagnosis not present

## 2015-05-15 MED ORDER — LOSARTAN POTASSIUM 25 MG PO TABS: 25.0000 mg | ORAL_TABLET | Freq: Every day | ORAL | Status: DC

## 2015-05-15 NOTE — Progress Notes (Signed)
Patient ID: Jack Wallace, male   DOB: 01-23-1961, 54 y.o.   MRN: [id number]   Reason for Appointment : Followup of Type 2 Diabetes   History of Present Illness            Diagnosis: Type 2 diabetes mellitus, date of diagnosis: 1993         Past diabetes history: He has been treated with various drugs for his diabetes over the last several years. He has been taking metformin for at least 10 years and has been tolerating this. Over the years he has had additional medications to improve his control. He was also taking Actos previously and not clear if he had any side effects. This was stopped because of fear of long-term effects The last 5 years he was on glipizide also. Previously was taking 10 mg twice a day.   About 2 years ago his blood sugars were poorly controlled with readings up to 300 In early 2014 he started walking and lost 15 pounds. Subsequently he was getting low blood sugar during the day especially before lunch and sometimes in the afternoon His glipizide was  reduced but even with 5 mg twice a day he was getting hypoglycemia.  This was reduced and Invokana added. Invokana was stopped because of lack of clear benefit in 09/2013 Because of inadequate control he was started on Victoza on his visit in 10/14 in addition to his metformin and glipizide.  Recent history:   With Victoza and metformin his blood sugars have been fairly consistently controlled and A1c has been 7% or below On a previous visit he was having overall high readings in the mornings when he was asked to change his metformin to 3 tablets in the evening and 1 in the morning. However his PCP asked him to change his metformin to 2 tablets in the morning and 1 tablet in the evening even though he did not have any documented hypoglycemia He had an episode at work where he felt faint and with some rest and fluids he felt better in a couple of hours; no documented hypoglycemia and he does not remember what  his blood sugar was at work with this episode   Recently his morning sugars are about the same and generally high overall with some fluctuation He thinks his morning sugars may be higher if he has a bedtime snack which maybe cereal or fruit. Again he was asked to check his blood sugars after supper but he still does not do any since he goes to bed early after supper Blood sugars are generally fairly good midday and early afternoon, lowest glucose 86  Since his last visit he has lost weight and he thinks this is from more walking but also has had some nausea for the last 3 weeks Has not had nausea from Victoza before  Oral hypoglycemic drugs: Metformin 1500 MG daily  Monitors blood glucose:  1.8 times a day        Glucometer:  One Touch Verio Blood Glucose readings from meter download:   PRE-MEAL Breakfast  10 AM-1 PM  Dinner Bedtime Overall  Glucose range:  107-184   125-163   86-144     Mean/median:  138     127   Hypoglycemia : None recently  The diet that the patient has been following [id number] to limit carbohydrates. Meals: 2 meals per day.  usually eating a frozen meal at breakfast 9 am and supper 6 pm and only crackers  at lunch. Will have more snacks  sometimes after supper Dietician visit: Most recent:?  Wt Readings from Last 3 Encounters:  05/15/15 265 lb 6.4 oz (120.385 kg)  03/29/15 271 lb 12.8 oz (123.288 kg)  02/13/15 270 lb (122.471 kg)      Physical activity: exercise: No specific exercise,  active at work, walking to work and to the drug store which may be 20 minutes each way         His  A1c was 6.3% on 10/17/14  Lab Results  Component Value Date   HGBA1C 6.6* 05/09/2015   HGBA1C 6.5 02/13/2015   HGBA1C 6.5* 05/08/2014   Lab Results  Component Value Date   MICROALBUR <0.2 03/29/2015   [place] 86 05/09/2015   CREATININE 0.89 03/29/2015       Medication List       This list is accurate as of: 05/15/15  9:01 PM.  Always use your most recent med list.                Alpha Lipoic Acid 200 MG Caps  Take 200 mg by mouth. 2 pills daily     aspirin 81 MG tablet  Take 81 mg by mouth daily.     BIOTENE ORALBALANCE DRY MOUTH Gel  - Apply 1/2 inch length onto tongue and spread evenly repeat as often as needed  - Generic okay if available     dicyclomine 20 MG tablet  Commonly known as:  BENTYL  Take 1 tab twice daily     docusate sodium 100 MG capsule  Commonly known as:  COLACE  Take 100 mg by mouth daily.     gabapentin 600 MG tablet  Commonly known as:  NEURONTIN  Take 1 tablet by mouth 3 times daily     glucose blood test strip  Commonly known as:  ONETOUCH VERIO  Use to check blood sugar 2 times per day dx code E11.65     Insulin Pen Needle 32G X 5 MM Misc  Commonly known as:  NOVOTWIST  Use one per day     Liraglutide 18 MG/3ML Sopn  Inject 0.2 mLs (1.2 mg total) into the skin daily.     losartan 25 MG tablet  Commonly known as:  COZAAR  Take 1 tablet (25 mg total) by mouth daily.     Magnesium Oxide 400 (240 MG) MG Tabs  Take 400 mg by mouth 2 (two) times daily.     metFORMIN 500 MG tablet  Commonly known as:  GLUCOPHAGE  Take 1-2 tablets (500-1,000 mg total) by mouth 2 (two) times daily with a meal. 500 mg in a.m., 1000 in p.m.     ONETOUCH DELICA LANCETS FINE Misc     pantoprazole 40 MG tablet  Commonly known as:  PROTONIX  take 1 tablet by mouth once daily     pravastatin 40 MG tablet  Commonly known as:  PRAVACHOL  Take 40 mg by mouth every morning.     RESTORA Caps  Take 1 capsule by mouth daily.     simethicone 125 MG chewable tablet  Commonly known as:  MYLICON  Chew 893 mg by mouth every 6 (six) hours as needed for flatulence.     VITAMIN D PO  Take 1 tablet by mouth daily. D3 1000 IU     VITAMIN E PO  Take 1 capsule by mouth daily. 400 IU daily        Allergies: No Known Allergies  Past  Medical History  Diagnosis Date  . Diabetes mellitus without complication   . Hypertension     . Hyperlipidemia   . Ischemic colitis     Past Surgical History  Procedure Laterality Date  . Nasal septum surgery    . Colonoscopy  06/01/2014    Family History  Problem Relation Age of Onset  . Hypertension Mother   . Diabetes Maternal Grandfather   . Colon cancer Neg Hx   . Throat cancer Neg Hx   . Prostate cancer Neg Hx   . Pancreatic cancer Neg Hx   . Heart disease Neg Hx   . Kidney disease Neg Hx   . Liver disease Neg Hx     Social History:  reports that he has never smoked. He has never used smokeless tobacco. He reports that he does not drink alcohol or use illicit drugs.    Review of Systems      Hyperlipidemia: taking pravastatin  40 mg for several years. .Lipids  show LDL 86.  His nurse at work told him to stop taking pravastatin but he has not done so     The blood pressure has been  been treated with any medications since his hospitalization for probable ischemic bowel when he had hypotension also Checking at home or work periodically but he thinks that his blood pressure is fluctuating significantly and sometimes as high as 90 diastolic   He has chronic mild hypomagnesemia which is asymptomatic, no muscle cramps and is taking 1000 mg a day of supplements; magnesium is still 1.3  Recently having problems with nausea for about 3 weeks.  This may occur in the morning or afternoon and not in the evening.  He takes his Victoza in the evening.  Has not discussed with PCP.  No abdominal pain or change in bowel habits  He has had symptoms of neuropathy.  He mostly had burning which would be worse when he would try to drive.  He still takes gabapentin 600 mg 3 times a day even though he has no symptoms  Foot exam last: 04/2015  Physical Examination:  BP 144/100 mmHg  Pulse 79  Temp(Src) 98.4 F (36.9 C)  Resp 16  Ht 6' (1.829 m)  Wt 265 lb 6.4 oz (120.385 kg)  BMI 35.99 kg/m2  SpO2 97%  Repeat blood pressure 130/88 Pulse regular No  edema  Diabetic foot  exam shows normal monofilament sensation in the toes and plantar surfaces, no skin lesions or ulcers on the feet and normal pedal pulses Callus present on basal both first metatarsals   ASSESSMENT/PLAN:   Diabetes type 2:  Blood sugars are well controlled with A1c 6.6 although still has overall higher fasting blood sugars which are averaging about 140 As discussed in history of present illness he does not check his blood sugars after her evening meal and not clear if they are high His blood sugars are better during the day but he is generally more active also Discussed with him that it is unlikely that he had symptomatic hypoglycemia with taking only metformin and Victoza and his episode at work didn't did not get relieved right away with whatever he was given to drink  He is taking metformin 2 tablets in the morning and 1 in the afternoon and since his fasting blood sugars are higher he should do better with switching the doses to the larger dose in the evening with his main meal Also empirically will try to reduce his Victoza to  0.6 mg for a week and his gabapentin to half tablet 3 times a day with food Meanwhile he will try to check blood sugars at bedtime more regularly and call if significantly high  ?  HYPERTENSION: His blood pressure is significantly high today and has been periodically high Because of his diabetes he should be on an ARB drug and will start him on 25 mg losartan for now Will continue to monitor blood pressure at work but not clear how reliable this is  HYPERLIPIDEMIA: Discussed importance of taking statin drugs with diabetes to reduce risk and he will continue pravastatin which she is tolerating well  Hypomagnesemia: Not improved with magnesium and may consider giving 4 g IV at the short stay, will deferred to PCP  Patient Instructions  Check blood sugars on waking up .3-4.Marland Kitchen times a week Also check blood sugars about 2 hours after a meal and do this after different  meals by rotation  Recommended blood sugar levels on waking up is 90-130 and bedtime Please bring blood sugar monitor to each visit.  Metformin 1 at Bfst and 2 at supper  Victoza 0.6mg  for 1 week   Gabapentin 1/2 tab with each meals  Magnesium 3 daily  Start Losartan  Counseling time on subjects discussed above is over 50% of today's 25 minute visit   Solina Heron 05/15/2015, 9:01 PM

## 2015-05-15 NOTE — Patient Instructions (Addendum)
Check blood sugars on waking up .3-4.Marland Kitchen times a week Also check blood sugars about 2 hours after a meal and do this after different meals by rotation  Recommended blood sugar levels on waking up is 90-130 and bedtime Please bring blood sugar monitor to each visit.  Metformin 1 at Bfst and 2 at supper  Victoza 0.6mg  for 1 week   Gabapentin 1/2 tab with each meals  Magnesium 3 daily  Start Losartan

## 2015-05-31 ENCOUNTER — Other Ambulatory Visit: Payer: Self-pay | Admitting: *Deleted

## 2015-05-31 MED ORDER — GABAPENTIN 600 MG PO TABS: ORAL_TABLET | ORAL | Status: DC

## 2015-06-24 ENCOUNTER — Other Ambulatory Visit: Payer: Self-pay | Admitting: Internal Medicine

## 2015-06-25 ENCOUNTER — Other Ambulatory Visit: Payer: Self-pay | Admitting: *Deleted

## 2015-06-25 MED ORDER — INSULIN PEN NEEDLE 32G X 5 MM MISC: Status: DC

## 2015-07-16 ENCOUNTER — Telehealth: Payer: Self-pay | Admitting: Dietician

## 2015-07-16 NOTE — Telephone Encounter (Signed)
Confirmed that patient attended his appointment on 01/17/15 and they are faxing [place] the report.

## 2015-07-17 NOTE — Telephone Encounter (Signed)
RD called patient to discuss MNT benefits.  Patient called and has an appointment here on 07/18/15 and will talk to RD, CDE then.

## 2015-07-18 ENCOUNTER — Encounter: Payer: Self-pay | Admitting: Internal Medicine

## 2015-07-18 ENCOUNTER — Ambulatory Visit (INDEPENDENT_AMBULATORY_CARE_PROVIDER_SITE_OTHER): Payer: BLUE CROSS/BLUE SHIELD | Admitting: Internal Medicine

## 2015-07-18 VITALS — BP 113/73 | HR 80 | Temp 98.4°F | Wt 267.5 lb

## 2015-07-18 DIAGNOSIS — I10 Essential (primary) hypertension: Secondary | ICD-10-CM

## 2015-07-18 DIAGNOSIS — E119 Type 2 diabetes mellitus without complications: Secondary | ICD-10-CM | POA: Diagnosis not present

## 2015-07-18 DIAGNOSIS — E1142 Type 2 diabetes mellitus with diabetic polyneuropathy: Secondary | ICD-10-CM

## 2015-07-18 DIAGNOSIS — K589 Irritable bowel syndrome without diarrhea: Secondary | ICD-10-CM | POA: Diagnosis not present

## 2015-07-18 LAB — MAGNESIUM: Magnesium: 1.5 mg/dL (ref 1.5–2.5)

## 2015-07-18 MED ORDER — POLYETHYLENE GLYCOL 3350 17 G PO PACK: 17.0000 g | PACK | Freq: Every day | ORAL | Status: DC

## 2015-07-18 MED ORDER — MAGNESIUM OXIDE -MG SUPPLEMENT 400 (240 MG) MG PO TABS: 1.0000 | ORAL_TABLET | Freq: Three times a day (TID) | ORAL | Status: DC

## 2015-07-18 NOTE — Assessment & Plan Note (Signed)
BP Readings from Last 3 Encounters:  07/18/15 113/73  05/15/15 144/100  03/29/15 127/79   Patient started on Losartan 25 mg 2 months ago. His BP today is 117/73. He is concerned about his blood pressure dropping too low and states that he was admitted to the hospital last year for that reason. Patient's previous blood pressures have been controlled with exception of latest BP reading of 144/100. Will try reduced dose of Losartan.  -Advised patient to take half a tablet of Losartan once a day for 12.5 mg dose.

## 2015-07-18 NOTE — Assessment & Plan Note (Signed)
Patient takes magnesium supplementation. Latest magnesium in April 2016 was 1.3. He was taking magnesium oxide 400 mg twice a day, recently increased to 3 times daily. He denies any muscle cramping or related symptoms.  -Refill Magnesium oxide -Check Magnesium today

## 2015-07-18 NOTE — Assessment & Plan Note (Signed)
Patient sees Dr. Dwyane Dee for diabetic management and has appointment next month. Currently takes: Metformin 500 mg once with breakfast, twice with dinner Victoza 1.2 mg daily  Glucometer readings for the last month show average CBG of 121. He states that he has not had any recent highs. One low in the 80s in the last month corrected with food. Last Hgb A1C 6.6 on 05/09/2015.  He states that he has recently felt down and irritable. States that he was seeing a dietician before and his moods were much better at that time when he had counseling for his diabetes and nutrition.   -Continue current medications -Check Hgb A1C at next visit if not done by then -Refer to [facility] RD for diabetic and nutrition counseling

## 2015-07-18 NOTE — Assessment & Plan Note (Addendum)
Patient is having continued symptoms of abdominal pains and nausea along with constipation. He states the abdominal pains are associated with certain foods and he is able to discern which foods to avoid. He has tried OTC medications for constipation without relief. He is not able to distinguish any inciting or alleviating factors for his nausea. He denies any vomiting. Patient has been taking Magnesium supplements for hypomagnesemia. He denies any diarrhea.  This may be secondary to IBS or h/o ischemic bowel disease. Also has history of neuropathy, possibly gastroparesis though history is non-specific. Also may be side effect of medication. He has decreased his Gabapentin from 3 tablets a day to 1.5 tablets a day. Had a trial of half dose of Victoza at 0.6 mg without relief of symptoms. Back to 1.2 mg daily now.  -Will start Miralax daily for constipation. Observe for changes in nausea as well. -Refer to [facility] for Diabetes and Nutrition counseling. -May consider gastric emptying study in the future

## 2015-07-18 NOTE — Progress Notes (Signed)
Patient ID: Jack Wallace, male   DOB: [birth year], 54 y.o.   MRN: [id number]   Subjective:   [name]   DOB: September 21, 1961 54 y.o.   MRN: [id number]  HPI: [name] is a 54 y.o. gentleman with PMH of type 2 DM w/ peripheral neuropathy, HTN, h/o ischemic colitis, hypomagnesemia, GERD, and IBS who present for a regular follow up visit.  He sees Dr. Elayne Snare for management of his diabetes.  Please see problem list for details.   Past Medical History  Diagnosis Date  . Diabetes mellitus without complication   . Hypertension   . Hyperlipidemia   . Ischemic colitis    Current Outpatient Prescriptions  Medication Sig Dispense Refill  . Alpha Lipoic Acid 200 MG CAPS Take 200 mg by mouth. 2 pills daily    . Artificial Saliva (BIOTENE ORALBALANCE DRY MOUTH) GEL Apply 1/2 inch length onto tongue and spread evenly repeat as often as needed Generic okay if available 42 g 2  . aspirin 81 MG tablet Take 81 mg by mouth daily.    . Cholecalciferol (VITAMIN D PO) Take 1 tablet by mouth daily. D3 1000 IU    . dicyclomine (BENTYL) 20 MG tablet Take 1 tab twice daily 60 tablet 10  . docusate sodium (COLACE) 100 MG capsule Take 100 mg by mouth daily.    Marland Kitchen gabapentin (NEURONTIN) 600 MG tablet Take 1 tablet by mouth 3 times daily 90 tablet 3  . glucose blood (ONETOUCH VERIO) test strip Use to check blood sugar 2 times per day dx code E11.65 100 each 5  . Insulin Pen Needle (NOVOTWIST) 32G X 5 MM MISC Use one per day 100 each 3  . Liraglutide 18 MG/3ML SOPN Inject 0.2 mLs (1.2 mg total) into the skin daily. 18 mL 1  . losartan (COZAAR) 25 MG tablet Take 1 tablet (25 mg total) by mouth daily. 30 tablet 3  . Magnesium Oxide 400 (240 MG) MG TABS Take 1 tablet by mouth 3 (three) times daily. Take 1 in the morning and 2 in the evening. 90 tablet 2  . metFORMIN (GLUCOPHAGE) 500 MG tablet Take 1-2 tablets (500-1,000 mg total) by mouth 2 (two) times daily with a meal. 500 mg in a.m., 1000  in p.m. 120 tablet 3  . ONETOUCH DELICA LANCETS FINE MISC     . pantoprazole (PROTONIX) 40 MG tablet take 1 tablet by mouth once daily 30 tablet 5  . polyethylene glycol (MIRALAX) packet Take 17 g by mouth daily. 30 each 0  . pravastatin (PRAVACHOL) 40 MG tablet Take 40 mg by mouth every morning.     . Probiotic Product (RESTORA) CAPS Take 1 capsule by mouth daily. 6 capsule 0  . simethicone (MYLICON) 782 MG chewable tablet Chew 125 mg by mouth every 6 (six) hours as needed for flatulence.    Marland Kitchen VITAMIN E PO Take 1 capsule by mouth daily. 400 IU daily     No current facility-administered medications for this visit.   Family History  Problem Relation Age of Onset  . Hypertension Mother   . Diabetes Maternal Grandfather   . Colon cancer Neg Hx   . Throat cancer Neg Hx   . Prostate cancer Neg Hx   . Pancreatic cancer Neg Hx   . Heart disease Neg Hx   . Kidney disease Neg Hx   . Liver disease Neg Hx    History   Social History  .  Marital Status: Married    Spouse Name: N/A  . Number of Children: N/A  . Years of Education: N/A   Social History Main Topics  . Smoking status: Never Smoker   . Smokeless tobacco: Never Used  . Alcohol Use: No  . Drug Use: No  . Sexual Activity: Not on file   Other Topics Concern  . None   Social History Narrative   Review of Systems: Review of Systems  Constitutional: Negative for fever, chills and malaise/fatigue.  Respiratory: Negative for shortness of breath and wheezing.   Cardiovascular: Negative for chest pain, palpitations, claudication and leg swelling.  Gastrointestinal: Positive for nausea, abdominal pain and constipation. Negative for vomiting, diarrhea and blood in stool.  Genitourinary: Negative for dysuria, urgency, frequency and hematuria.  Musculoskeletal: Negative for myalgias.  Skin: Negative for rash.  Neurological: Negative for dizziness.  Psychiatric/Behavioral:       Patient states he has irritability and feels "down"  at times.    Objective:  Physical Exam: Filed Vitals:   07/18/15 1535  BP: 113/73  Pulse: 80  Temp: 98.4 F (36.9 C)  TempSrc: Oral  Weight: 267 lb 8 oz (121.337 kg)  SpO2: 97%   Physical Exam  Constitutional: He is oriented to person, place, and time. He appears well-developed and well-nourished.  HENT:  Head: Normocephalic and atraumatic.  Eyes: Conjunctivae and EOM are normal.  Neck: Normal range of motion. Neck supple. No thyromegaly present.  Cardiovascular: Normal rate, regular rhythm and normal heart sounds.   Pulmonary/Chest: Effort normal and breath sounds normal. No respiratory distress. He has no wheezes.  Abdominal: Soft. There is no tenderness. There is no guarding.  Musculoskeletal: Normal range of motion. He exhibits no edema or tenderness.  Neurological: He is alert and oriented to person, place, and time.  Skin: Skin is warm.  Psychiatric: He has a normal mood and affect.    Assessment & Plan:  Please see problem based charting for current assessment and plan.

## 2015-07-18 NOTE — Patient Instructions (Signed)
Thank you for your visit.  We will check your magnesium level today and I will send a prescription for your magnesium supplements.  I will refer you to [facility] for Diabetes and Nutrition counseling.  I will send a prescription for Miralax for your constipation, we hope this helps your nausea as well.  Please start taking half a pill of your Losartan once a day.

## 2015-07-19 NOTE — Progress Notes (Signed)
Internal Medicine Clinic Attending  I saw and evaluated the patient.  I personally confirmed the key portions of the history and exam documented by Dr. Patel,Vishal and I reviewed pertinent patient test results.  The assessment, diagnosis, and plan were formulated together and I agree with the documentation in the resident's note.  

## 2015-07-25 ENCOUNTER — Encounter: Payer: Self-pay | Admitting: *Deleted

## 2015-08-15 ENCOUNTER — Ambulatory Visit (INDEPENDENT_AMBULATORY_CARE_PROVIDER_SITE_OTHER): Payer: BLUE CROSS/BLUE SHIELD | Admitting: Endocrinology

## 2015-08-15 ENCOUNTER — Encounter: Payer: Self-pay | Admitting: Endocrinology

## 2015-08-15 VITALS — BP 110/70 | HR 88 | Temp 98.2°F | Resp 16 | Ht 72.0 in | Wt 265.8 lb

## 2015-08-15 DIAGNOSIS — E1142 Type 2 diabetes mellitus with diabetic polyneuropathy: Secondary | ICD-10-CM | POA: Diagnosis not present

## 2015-08-15 LAB — POCT GLYCOSYLATED HEMOGLOBIN (HGB A1C): Hemoglobin A1C: 6.5

## 2015-08-15 NOTE — Patient Instructions (Signed)
Check blood sugars on waking up .Marland Kitchen3-4  .Marland Kitchen times a week Also check blood sugars about 2 hours after a meal and do this after different meals by rotation  Recommended blood sugar levels on waking up is 90-130 and about 2 hours after meal is 140-180 Please bring blood sugar monitor to each visit.

## 2015-08-15 NOTE — Progress Notes (Signed)
Patient ID: Jack Wallace, male   DOB: Oct 31, 1961, 54 y.o.   MRN: [id number]   Reason for Appointment : Followup of Type 2 Diabetes   History of Present Illness            Diagnosis: Type 2 diabetes mellitus, date of diagnosis: 1993         Past diabetes history: He has been treated with various drugs for his diabetes over the last several years. He has been taking metformin for at least 10 years and has been tolerating this. Over the years he has had additional medications to improve his control. He was also taking Actos previously and not clear if he had any side effects. This was stopped because of fear of long-term effects The last 5 years he was on glipizide also. Previously was taking 10 mg twice a day.   About 2 years ago his blood sugars were poorly controlled with readings up to 300 In early 2014 he started walking and lost 15 pounds. Subsequently he was getting low blood sugar during the day especially before lunch and sometimes in the afternoon His glipizide was  reduced but even with 5 mg twice a day he was getting hypoglycemia.  This was reduced and Invokana added. Invokana was stopped because of lack of clear benefit in 09/2013 Because of inadequate control he was started on Victoza on his visit in 10/14 in addition to his metformin and glipizide.  Recent history:   With Victoza and metformin his blood sugars have been fairly consistently controlled and A1c has been below 7% consistently Because of high readings in the mornings when he was asked to change his metformin to 3 tablets in the evening and 1 in the morning. He was also tried on 0.6 mg Victoza for a week after his last visit because of symptoms of nausea but the nausea did not get any better  Current blood sugar patterns and management:  He is checking his blood sugar is fairly consistently twice a day  Fasting blood sugars are somewhat variable but recently relatively better  Blood sugars maybe  sporadically higher during the day or after meals but generally trending lower towards the evening  Even after his evening meal his blood sugars are not significantly high  He still has difficulty losing weight even though he is trying to walk regularly  Oral hypoglycemic drugs: Metformin 1500 MG daily  Monitors blood glucose:  1.8 times a day        Glucometer:  One Touch Verio Blood Glucose readings from meter download:   Mean values apply above for all meters except median for One Touch  PRE-MEAL Fasting  midday   PC Dinner Bedtime Overall  Glucose range:  99-197   122, 164   94-156    94-197   Mean/median:     128   Hypoglycemia : None recently  The diet that the patient has been following [id number] to limit carbohydrates. Meals: 2 meals per day.  usually eating a frozen meal at breakfast 9 am and supper 4pm and only crackers at lunch. Will have more snacks  sometimes after supper Dietician visit: Most recent:?  Wt Readings from Last 3 Encounters:  08/15/15 265 lb 12.8 oz (120.566 kg)  07/18/15 267 lb 8 oz (121.337 kg)  05/15/15 265 lb 6.4 oz (120.385 kg)      Physical activity: exercise: No specific exercise,  active at work, walking to work and to the stores which may  be 20 minutes each way         His  A1c was 6.3% on 10/17/14  Lab Results  Component Value Date   HGBA1C 6.5 08/15/2015   HGBA1C 6.6* 05/09/2015   HGBA1C 6.5 02/13/2015   Lab Results  Component Value Date   MICROALBUR <0.2 03/29/2015   [place] 86 05/09/2015   CREATININE 0.89 03/29/2015       Medication List       This list is accurate as of: 08/15/15 11:59 PM.  Always use your most recent med list.               Alpha Lipoic Acid 200 MG Caps  Take 200 mg by mouth. 2 pills daily     aspirin 81 MG tablet  Take 81 mg by mouth daily.     BIOTENE ORALBALANCE DRY MOUTH Gel  Apply 1/2 inch length onto tongue and spread evenly repeat as often as needed Generic okay if available      dicyclomine 20 MG tablet  Commonly known as:  BENTYL  Take 1 tab twice daily     docusate sodium 100 MG capsule  Commonly known as:  COLACE  Take 100 mg by mouth daily.     gabapentin 600 MG tablet  Commonly known as:  NEURONTIN  Take 1 tablet by mouth 3 times daily     glucose blood test strip  Commonly known as:  ONETOUCH VERIO  Use to check blood sugar 2 times per day dx code E11.65     Insulin Pen Needle 32G X 5 MM Misc  Commonly known as:  NOVOTWIST  Use one per day     Liraglutide 18 MG/3ML Sopn  Inject 0.2 mLs (1.2 mg total) into the skin daily.     losartan 25 MG tablet  Commonly known as:  COZAAR  Take 1 tablet (25 mg total) by mouth daily.     Magnesium Oxide 400 (240 MG) MG Tabs  Take 1 tablet by mouth 3 (three) times daily. Take 1 in the morning and 2 in the evening.     metFORMIN 500 MG tablet  Commonly known as:  GLUCOPHAGE  Take 1-2 tablets (500-1,000 mg total) by mouth 2 (two) times daily with a meal. 500 mg in a.m., 1000 in p.m.     ondansetron 4 MG tablet  Commonly known as:  ZOFRAN  Take 1 tablet (4 mg total) by mouth 2 (two) times daily as needed for nausea or vomiting.     ONETOUCH DELICA LANCETS FINE Misc     pantoprazole 40 MG tablet  Commonly known as:  PROTONIX  take 1 tablet by mouth once daily     polyethylene glycol packet  Commonly known as:  MIRALAX  Take 17 g by mouth daily.     pravastatin 40 MG tablet  Commonly known as:  PRAVACHOL  Take 40 mg by mouth every morning.     RESTORA Caps  Take 1 capsule by mouth daily.     simethicone 125 MG chewable tablet  Commonly known as:  MYLICON  Chew 203 mg by mouth every 6 (six) hours as needed for flatulence.     vitamin B-12 500 MCG tablet  Commonly known as:  CYANOCOBALAMIN  Take 500 mcg by mouth daily.     VITAMIN D PO  Take 1 tablet by mouth daily. D3 1000 IU     VITAMIN E PO  Take 1 capsule by mouth daily. 400 IU daily  Allergies: No Known Allergies  Past  Medical History  Diagnosis Date  . Diabetes mellitus without complication   . Hypertension   . Hyperlipidemia   . Ischemic colitis     Past Surgical History  Procedure Laterality Date  . Nasal septum surgery    . Colonoscopy  06/01/2014    Family History  Problem Relation Age of Onset  . Hypertension Mother   . Diabetes Maternal Grandfather   . Colon cancer Neg Hx   . Throat cancer Neg Hx   . Prostate cancer Neg Hx   . Pancreatic cancer Neg Hx   . Heart disease Neg Hx   . Kidney disease Neg Hx   . Liver disease Neg Hx     Social History:  reports that he has never smoked. He has never used smokeless tobacco. He reports that he does not drink alcohol or use illicit drugs.    Review of Systems      Hyperlipidemia: taking pravastatin  40 mg for several years. .Lipids  show LDL 86.  Usually has regular monitoring with lab work at work  Lab Results  Component Value Date   [place] [address] 05/09/2015        The blood pressure has been  been treated with any medications since his hospitalization for probable ischemic bowel when he had hypotension also Checking at home or work periodically but he thinks that his blood pressure is fluctuating significantly and sometimes as high as 90 diastolic   He has chronic mild hypomagnesemia which is asymptomatic, no muscle cramps and is taking 1000 mg a day of supplements; magnesium is now normal  Still having problems with nausea and has not been evaluated or prescribed anything by his PCP   HYPERTENSION: His blood pressure was relatively high on his last visit and he was started on 25 mg of losartan This was reduced by PCP to half tablet  He has had symptoms of neuropathy.  He mostly had burning which would be worse when he would try to drive.  He still takes gabapentin 300-600 mg 3 times a day even though he has no symptoms  Foot exam last: 04/2015  Physical Examination:  BP 110/70 mmHg  Pulse 88  Temp(Src) 98.2 F (36.8 C)  Resp  16  Ht 6' (1.829 m)  Wt 265 lb 12.8 oz (120.566 kg)  BMI 36.04 kg/m2  SpO2 98%  No pedal edema   ASSESSMENT/PLAN:   Diabetes type 2:  Blood sugars are well controlled with A1c 6.5, his fasting readings tend to be relatively higher As discussed in history of present illness he has excellent readings after his meals also in the evening Although he has not lost any weight he has been able to maintain weight fairly steady He is trying to be as active as possible and watch his carbohydrates He is compliant with his Victoza and he also wants to continue this as it has helped him significantly    HYPERTENSION: His blood pressure is better even with starting him only on 25 mg losartan This should benefit him long-term also Blood pressure is fairly good even though he is taking only half a tablet daily as recommended by his PCP  Will continue to monitor blood pressure at work but not clear how reliable this is  HYPERLIPIDEMIA: Well controlled with pravastatin  Patient Instructions  Check blood sugars on waking up .Marland Kitchen3-4  .Marland Kitchen times a week Also check blood sugars about 2 hours after a meal and  do this after different meals by rotation  Recommended blood sugar levels on waking up is 90-130 and about 2 hours after meal is 140-180 Please bring blood sugar monitor to each visit.      Counseling time on subjects discussed above is over 50% of today's 25 minute visit   Jack Wallace 08/16/2015, 5:03 PM

## 2015-08-16 ENCOUNTER — Telehealth: Payer: Self-pay | Admitting: Endocrinology

## 2015-08-16 MED ORDER — ONDANSETRON HCL 4 MG PO TABS: 4.0000 mg | ORAL_TABLET | Freq: Two times a day (BID) | ORAL | Status: DC | PRN

## 2015-08-16 NOTE — Telephone Encounter (Signed)
Please see below and advise.

## 2015-08-16 NOTE — Telephone Encounter (Signed)
Have sent to [facility]

## 2015-08-16 NOTE — Telephone Encounter (Signed)
Pt states he needs the nausea med called in that was discussed at the visit yesterday Please call into [facility] on [address]

## 2015-08-19 NOTE — Telephone Encounter (Signed)
Noted  

## 2015-08-20 ENCOUNTER — Telehealth: Payer: Self-pay | Admitting: Gastroenterology

## 2015-08-20 ENCOUNTER — Encounter: Payer: Self-pay | Admitting: Internal Medicine

## 2015-08-20 ENCOUNTER — Ambulatory Visit (INDEPENDENT_AMBULATORY_CARE_PROVIDER_SITE_OTHER): Payer: BLUE CROSS/BLUE SHIELD | Admitting: Internal Medicine

## 2015-08-20 VITALS — BP 101/69 | HR 99 | Temp 98.2°F | Ht 72.0 in | Wt 269.9 lb

## 2015-08-20 DIAGNOSIS — K219 Gastro-esophageal reflux disease without esophagitis: Secondary | ICD-10-CM | POA: Diagnosis not present

## 2015-08-20 DIAGNOSIS — K5900 Constipation, unspecified: Secondary | ICD-10-CM | POA: Diagnosis not present

## 2015-08-20 DIAGNOSIS — R1013 Epigastric pain: Secondary | ICD-10-CM

## 2015-08-20 DIAGNOSIS — E119 Type 2 diabetes mellitus without complications: Secondary | ICD-10-CM

## 2015-08-20 DIAGNOSIS — R110 Nausea: Secondary | ICD-10-CM

## 2015-08-20 DIAGNOSIS — I10 Essential (primary) hypertension: Secondary | ICD-10-CM

## 2015-08-20 DIAGNOSIS — Z0000 Encounter for general adult medical examination without abnormal findings: Secondary | ICD-10-CM

## 2015-08-20 MED ORDER — BISACODYL 10 MG RE SUPP: 10.0000 mg | RECTAL | Status: DC | PRN

## 2015-08-20 MED ORDER — SENNOSIDES-DOCUSATE SODIUM 8.6-50 MG PO TABS: 1.0000 | ORAL_TABLET | Freq: Two times a day (BID) | ORAL | Status: DC | PRN

## 2015-08-20 MED ORDER — PANTOPRAZOLE SODIUM 40 MG PO TBEC: 40.0000 mg | DELAYED_RELEASE_TABLET | Freq: Two times a day (BID) | ORAL | Status: DC

## 2015-08-20 NOTE — Assessment & Plan Note (Addendum)
Assessment: Pt with well-controlled Type 2 DM and GERD with chronic nausea and epigastric pain of unclear etiology possibly due to gastroparesis vs dyspepsia.    Plan:  -Continue zofran 4 mg BID PRN nausea -Consider EGD and gastric emptying study -Pt given handout regarding gastroparesis diet  -Pt to follow-up with GI on 08/23/15

## 2015-08-20 NOTE — Telephone Encounter (Signed)
Patient with change in bowel habits 5 days of constipation.  He reports that he woke up coughing and spit up blood.  He is [facility] the internal med clinic now.  He will come in and see Arta Bruce, PA on Friday.  He was offered an earlier appt, but can't come.  He will cancel if appt is not needed

## 2015-08-20 NOTE — Assessment & Plan Note (Signed)
-  Pt declined annual influenza vaccination at this time, inquire again at next visit  -Obtain screening HCV Ab (born b/ow 1946-65) at next visit

## 2015-08-20 NOTE — Patient Instructions (Signed)
-Take senokot-S twice a day until your constipation improves, if it doesn't take ducolax suppository  -Increase your protonix to twice a day and take it at least an hr before breakfast and dinner -Increase your fiber and fluid intake and you can take OTC metamucil, a fiber supplement -Please follow-up with GI on Friday, you may need to have an EGD and gastric emptying study  -Please follow the diets for GERD below -Very nice seeing you again!   Gastroesophageal Reflux Disease, Adult Gastroesophageal reflux disease (GERD) happens when acid from your stomach flows up into the esophagus. When acid comes in contact with the esophagus, the acid causes soreness (inflammation) in the esophagus. Over time, GERD may create small holes (ulcers) in the lining of the esophagus. CAUSES   Increased body weight. This puts pressure on the stomach, making acid rise from the stomach into the esophagus.  Smoking. This increases acid production in the stomach.  Drinking alcohol. This causes decreased pressure in the lower esophageal sphincter (valve or ring of muscle between the esophagus and stomach), allowing acid from the stomach into the esophagus.  Late evening meals and a full stomach. This increases pressure and acid production in the stomach.  A malformed lower esophageal sphincter. Sometimes, no cause is found. SYMPTOMS   Burning pain in the lower part of the mid-chest behind the breastbone and in the mid-stomach area. This may occur twice a week or more often.  Trouble swallowing.  Sore throat.  Dry cough.  Asthma-like symptoms including chest tightness, shortness of breath, or wheezing. DIAGNOSIS  Your caregiver may be able to diagnose GERD based on your symptoms. In some cases, X-rays and other tests may be done to check for complications or to check the condition of your stomach and esophagus. TREATMENT  Your caregiver may recommend over-the-counter or prescription medicines to help  decrease acid production. Ask your caregiver before starting or adding any new medicines.  HOME CARE INSTRUCTIONS   Change the factors that you can control. Ask your caregiver for guidance concerning weight loss, quitting smoking, and alcohol consumption.  Avoid foods and drinks that make your symptoms worse, such as:  Caffeine or alcoholic drinks.  Chocolate.  Peppermint or mint flavorings.  Garlic and onions.  Spicy foods.  Citrus fruits, such as oranges, lemons, or limes.  Tomato-based foods such as sauce, chili, salsa, and pizza.  Fried and fatty foods.  Avoid lying down for the 3 hours prior to your bedtime or prior to taking a nap.  Eat small, frequent meals instead of large meals.  Wear loose-fitting clothing. Do not wear anything tight around your waist that causes pressure on your stomach.  Raise the head of your bed 6 to 8 inches with wood blocks to help you sleep. Extra pillows will not help.  Only take over-the-counter or prescription medicines for pain, discomfort, or fever as directed by your caregiver.  Do not take aspirin, ibuprofen, or other nonsteroidal anti-inflammatory drugs (NSAIDs). SEEK IMMEDIATE MEDICAL CARE IF:   You have pain in your arms, neck, jaw, teeth, or back.  Your pain increases or changes in intensity or duration.  You develop nausea, vomiting, or sweating (diaphoresis).  You develop shortness of breath, or you faint.  Your vomit is green, yellow, black, or looks like coffee grounds or blood.  Your stool is red, bloody, or black. These symptoms could be signs of other problems, such as heart disease, gastric bleeding, or esophageal bleeding. MAKE SURE YOU:   Understand these  instructions.  Will watch your condition.  Will get help right away if you are not doing well or get worse. Document Released: 09/16/2005 Document Revised: 02/29/2012 Document Reviewed: 06/26/2011 [facility] Patient Information 2015 [place], [place]. This  information is not intended to replace advice given to you by your health care provider. Make sure you discuss any questions you have with your health care provider.     Food Choices for Gastroesophageal Reflux Disease When you have gastroesophageal reflux disease (GERD), the foods you eat and your eating habits are very important. Choosing the right foods can help ease the discomfort of GERD. WHAT GENERAL GUIDELINES DO I NEED TO FOLLOW?  Choose fruits, vegetables, whole grains, low-fat dairy products, and low-fat meat, fish, and poultry.  Limit fats such as oils, salad dressings, butter, nuts, and avocado.  Keep a food diary to identify foods that cause symptoms.  Avoid foods that cause reflux. These may be different for different people.  Eat frequent small meals instead of three large meals each day.  Eat your meals slowly, in a relaxed setting.  Limit fried foods.  Cook foods using methods other than frying.  Avoid drinking alcohol.  Avoid drinking large amounts of liquids with your meals.  Avoid bending over or lying down until 2-3 hours after eating. WHAT FOODS ARE NOT RECOMMENDED? The following are some foods and drinks that may worsen your symptoms: Vegetables Tomatoes. Tomato juice. Tomato and spaghetti sauce. Chili peppers. Onion and garlic. Horseradish. Fruits Oranges, grapefruit, and lemon (fruit and juice). Meats High-fat meats, fish, and poultry. This includes hot dogs, ribs, ham, sausage, salami, and bacon. Dairy Whole milk and chocolate milk. Sour cream. Cream. Butter. Ice cream. Cream cheese.  Beverages Coffee and tea, with or without caffeine. Carbonated beverages or energy drinks. Condiments Hot sauce. Barbecue sauce.  Sweets/Desserts Chocolate and cocoa. Donuts. Peppermint and spearmint. Fats and Oils High-fat foods, including [place] fries and potato chips. Other Vinegar. Strong spices, such as black pepper, white pepper, red pepper, cayenne, curry  powder, cloves, ginger, and chili powder. The items listed above may not be a complete list of foods and beverages to avoid. Contact your dietitian for more information. Document Released: 12/07/2005 Document Revised: 12/12/2013 Document Reviewed: 10/11/2013 [facility] Patient Information 2015 [place], [place]. This information is not intended to replace advice given to you by your health care provider. Make sure you discuss any questions you have with your health care provider.   Gastroparesis  Gastroparesis is also called slowed stomach emptying (delayed gastric emptying). It is a condition in which the stomach takes too long to empty its contents. It often happens in people with diabetes.  CAUSES  Gastroparesis happens when nerves to the stomach are damaged or stop working. When the nerves are damaged, the muscles of the stomach and intestines do not work normally. The movement of food is slowed or stopped. High blood glucose (sugar) causes changes in nerves and can damage the blood vessels that carry oxygen and nutrients to the nerves. RISK FACTORS  Diabetes.  Post-viral syndromes.  Eating disorders (anorexia, bulimia).  Surgery on the stomach or vagus nerve.  Gastroesophageal reflux disease (rarely).  Smooth muscle disorders (amyloidosis, scleroderma).  Metabolic disorders, including hypothyroidism.  Parkinson disease. SYMPTOMS   Heartburn.  Feeling sick to your stomach (nausea).  Vomiting of undigested food.  An early feeling of fullness when eating.  Weight loss.  Abdominal bloating.  Erratic blood glucose levels.  Lack of appetite.  Gastroesophageal reflux.  Spasms of the  stomach wall. Complications can include:  Bacterial overgrowth in stomach. Food stays in the stomach and can ferment and cause bacteria to grow.  Weight loss due to difficulty digesting and absorbing nutrients.  Vomiting.  Obstruction in the stomach. Undigested food can harden and cause  nausea and vomiting.  Blood glucose fluctuations caused by inconsistent food absorption. DIAGNOSIS  The diagnosis of gastroparesis is confirmed through one or more of the following tests:  Barium X-rays and scans. These tests look at how long it takes for food to move through the stomach.  Gastric manometry. This test measures electrical and muscular activity in the stomach. A thin tube is passed down the throat into the stomach. The tube contains a wire that takes measurements of the stomach's electrical and muscular activity as it digests liquids and solid food.  Endoscopy. This procedure is done with a long, thin tube called an endoscope. It is passed through the mouth and gently down the esophagus into the stomach. This tube helps the caregiver look at the lining of the stomach to check for any abnormalities.  Ultrasonography. This can rule out gallbladder disease or pancreatitis. This test will outline and define the shape of the gallbladder and pancreas. TREATMENT   Treatments may include:  Exercise.  Medicines to control nausea and vomiting.  Medicines to stimulate stomach muscles.  Changes in what and when you eat.  Having smaller meals more often.  Eating low-fiber forms of high-fiber foods, such as eating cooked vegetables instead of raw vegetables.  Eating low-fat foods.  Consuming liquids, which are easier to digest.  In severe cases, feeding tubes and intravenous (IV) feeding may be needed. It is important to note that in most cases, treatment does not cure gastroparesis. It is usually a lasting (chronic) condition. Treatment helps you manage the underlying condition so that you can be as healthy and comfortable as possible. Other treatments  A gastric neurostimulator has been developed to assist people with gastroparesis. The battery-operated device is surgically implanted. It emits mild electrical pulses to help improve stomach emptying and to control nausea and  vomiting.  The use of botulinum toxin has been shown to improve stomach emptying by decreasing the prolonged contractions of the muscle between the stomach and the small intestine (pyloric sphincter). The benefits are temporary. SEEK MEDICAL CARE IF:   You have diabetes and you are having problems keeping your blood glucose in goal range.  You are having nausea, vomiting, bloating, or early feelings of fullness with eating.  Your symptoms do not change with a change in diet. Document Released: 12/07/2005 Document Revised: 04/03/2013 Document Reviewed: 05/16/2009 [facility] Patient Information 2015 [place], [place]. This information is not intended to replace advice given to you by your health care provider. Make sure you discuss any questions you have with your health care provider.   General Instructions:   Please bring your medicines with you each time you come to clinic.  Medicines may include prescription medications, over-the-counter medications, herbal remedies, eye drops, vitamins, or other pills.   Progress Toward Treatment Goals:  Treatment Goal 03/29/2015  Hemoglobin A1C at goal  Blood pressure at goal    Self Care Goals & Plans:  Self Care Goal 08/20/2015  Manage my medications take my medicines as prescribed; bring my medications to every visit; refill my medications on time; follow the sick day instructions if I am sick  Monitor my health keep track of my blood glucose; keep track of my blood pressure; check my feet  daily  Eat healthy foods eat more vegetables; eat baked foods instead of fried foods; eat foods that are low in salt; drink diet soda or water instead of juice or soda  Be physically active find an activity I enjoy  Meeting treatment goals -    Home Blood Glucose Monitoring 03/29/2015  Check my blood sugar 4 times a day  When to check my blood sugar before meals; at bedtime     Care Management & Community Referrals:  Referral 08/23/2014  Referrals made for  care management support none needed  Referrals made to community resources none

## 2015-08-20 NOTE — Assessment & Plan Note (Addendum)
Assessment: Pt with GERD compliant with PPI therapy who presents with exacerbation in setting of unclear trigger with no alarm symptoms.   Plan:  -Increase protonix from 40 mg daily to BID and instructed to take 1 hr before breakfast and dinner  -Pt given handout for lifestyle changes  -Pt has appt with GI on 08/23/15, consider EGD if symptoms do not improve with twice a day dosing

## 2015-08-20 NOTE — Progress Notes (Signed)
Patient ID: Jack Wallace, male   DOB: 11-17-1961, 54 y.o.   MRN: [id number]    Subjective:   [name]   DOB: 11-10-1961 54 y.o.   MRN: [id number]  HPI: [name] is a 54 y.o. very pleasant man with past medical history of hypertension, hyperlipidemia, ischemic colitis, non-insulin dependent DM, peripheral neuropathy, hypomagnesemia, and GERD who presents with chief compliant of acid reflux symptoms and constipation.   He has history of GERD and reports compliance with taking protonix 40 mg daily but takes it with his meal. He reports last night waking up in the middle of the night with burning/feeling of fire in his throat with vomit in his mouth without heartburn, regurgitation, burping, or hypersalivation. He then began coughing so hard that he noticed a small amount of blood in his sputum. He denies recent dietary indiscretion or alcohol use. He has never had EGD in the past. He denies dysphagia, odynophagia, weight loss, or melena.   He has also been having chronic constipation with no BM in 4-5 days and usually goes daily with OTC stool softener which he has increased to BID. He reports not drinking enough water or fiber. His last colonoscopy on 06/01/14 revealed internal hemorrhoids which he has occasional bleeding from. He has chronic nausea which he takes zofran for. He has never had a gastric emptying study in the past. He has chronic epigastric pain, flatulence, and bloating but denies distension. He is to see GI in three days.   He reports compliance with taking losartan for hypertension. He denies has been recently lightheaded but denies headache, blurry vision, chest pain, or LE edema.    He declines flu shot today.    Past Medical History  Diagnosis Date  . Diabetes mellitus without complication   . Hypertension   . Hyperlipidemia   . Ischemic colitis    Current Outpatient Prescriptions  Medication Sig Dispense Refill  . Alpha Lipoic Acid 200 MG  CAPS Take 200 mg by mouth. 2 pills daily    . Artificial Saliva (BIOTENE ORALBALANCE DRY MOUTH) GEL Apply 1/2 inch length onto tongue and spread evenly repeat as often as needed Generic okay if available 42 g 2  . aspirin 81 MG tablet Take 81 mg by mouth daily.    . Cholecalciferol (VITAMIN D PO) Take 1 tablet by mouth daily. D3 1000 IU    . dicyclomine (BENTYL) 20 MG tablet Take 1 tab twice daily 60 tablet 10  . docusate sodium (COLACE) 100 MG capsule Take 100 mg by mouth daily.    Marland Kitchen gabapentin (NEURONTIN) 600 MG tablet Take 1 tablet by mouth 3 times daily 90 tablet 3  . glucose blood (ONETOUCH VERIO) test strip Use to check blood sugar 2 times per day dx code E11.65 100 each 5  . Insulin Pen Needle (NOVOTWIST) 32G X 5 MM MISC Use one per day 100 each 3  . Liraglutide 18 MG/3ML SOPN Inject 0.2 mLs (1.2 mg total) into the skin daily. 18 mL 1  . losartan (COZAAR) 25 MG tablet Take 1 tablet (25 mg total) by mouth daily. 30 tablet 3  . Magnesium Oxide 400 (240 MG) MG TABS Take 1 tablet by mouth 3 (three) times daily. Take 1 in the morning and 2 in the evening. 90 tablet 2  . metFORMIN (GLUCOPHAGE) 500 MG tablet Take 1-2 tablets (500-1,000 mg total) by mouth 2 (two) times daily with a meal. 500 mg in a.m., 1000  in p.m. 120 tablet 3  . ondansetron (ZOFRAN) 4 MG tablet Take 1 tablet (4 mg total) by mouth 2 (two) times daily as needed for nausea or vomiting. 30 tablet 1  . ONETOUCH DELICA LANCETS FINE MISC     . pantoprazole (PROTONIX) 40 MG tablet take 1 tablet by mouth once daily 30 tablet 5  . polyethylene glycol (MIRALAX) packet Take 17 g by mouth daily. 30 each 0  . pravastatin (PRAVACHOL) 40 MG tablet Take 40 mg by mouth every morning.     . Probiotic Product (RESTORA) CAPS Take 1 capsule by mouth daily. 6 capsule 0  . simethicone (MYLICON) 601 MG chewable tablet Chew 125 mg by mouth every 6 (six) hours as needed for flatulence.    . vitamin B-12 (CYANOCOBALAMIN) 500 MCG tablet Take 500 mcg by  mouth daily.    Marland Kitchen VITAMIN E PO Take 1 capsule by mouth daily. 400 IU daily     No current facility-administered medications for this visit.   Family History  Problem Relation Age of Onset  . Hypertension Mother   . Diabetes Maternal Grandfather   . Colon cancer Neg Hx   . Throat cancer Neg Hx   . Prostate cancer Neg Hx   . Pancreatic cancer Neg Hx   . Heart disease Neg Hx   . Kidney disease Neg Hx   . Liver disease Neg Hx    Social History   Social History  . Marital Status: Married    Spouse Name: N/A  . Number of Children: N/A  . Years of Education: N/A   Social History Main Topics  . Smoking status: Never Smoker   . Smokeless tobacco: Never Used  . Alcohol Use: No  . Drug Use: No  . Sexual Activity: Not Asked   Other Topics Concern  . None   Social History Narrative   Review of Systems: Review of Systems  Constitutional: Negative for fever, chills and weight loss.  HENT: Positive for sore throat (irritated throat since last night).   Eyes: Negative for blurred vision.  Respiratory: Positive for hemoptysis (last night that has since resolved). Negative for cough, shortness of breath and wheezing.   Cardiovascular: Negative for chest pain and leg swelling.  Gastrointestinal: Positive for heartburn, nausea (chronic), abdominal pain (chronic epigastric), constipation (for past 4-5 days) and blood in stool (occasional BRBPR). Negative for vomiting, diarrhea and melena.  Genitourinary: Negative for dysuria, urgency, frequency and hematuria.  Neurological: Positive for dizziness (since this morning), sensory change (chronic peripheral neuropathy) and weakness (since this morning). Negative for headaches.    Objective:  Physical Exam: Filed Vitals:   08/20/15 1409  BP: 101/69  Pulse: 99  Temp: 98.2 F (36.8 C)  TempSrc: Oral  Height: 6' (1.829 m)  Weight: 269 lb 14.4 oz (122.426 kg)  SpO2: 95%   Physical Exam  Constitutional: He is oriented to person, place,  and time. He appears well-developed and well-nourished. No distress.  HENT:  Head: Normocephalic and atraumatic.  Right Ear: External ear normal.  Left Ear: External ear normal.  Nose: Nose normal.  Mouth/Throat: Oropharynx is clear and moist. No oropharyngeal exudate.  Eyes: Conjunctivae and EOM are normal. Pupils are equal, round, and reactive to light. Right eye exhibits no discharge. Left eye exhibits no discharge. No scleral icterus.  Neck: Normal range of motion. Neck supple.  Cardiovascular: Normal rate, regular rhythm and normal heart sounds.   Pulmonary/Chest: Effort normal and breath sounds normal. No respiratory distress.  He has no wheezes. He has no rales.  Abdominal: Soft. Bowel sounds are normal. He exhibits no distension. There is tenderness (milld in epigastric region). There is no rebound and no guarding.  Musculoskeletal: Normal range of motion. He exhibits no edema or tenderness.  Neurological: He is alert and oriented to person, place, and time.  Skin: Skin is warm and dry. No rash noted. He is not diaphoretic. No erythema. No pallor.  Psychiatric: He has a normal mood and affect. His behavior is normal. Judgment and thought content normal.    Assessment & Plan:   Please see problem list for problem-based assessment and plan

## 2015-08-20 NOTE — Assessment & Plan Note (Addendum)
Assessment: Pt with chronic constipation on stool softener therapy who presents with worsening symptoms for the past 4-5 days with no concern for obstruction.    Plan:  -Discontinue colace and miralax  -Pt instructed to increase fiber intake and if needed try OTC metamucil -Prescribe senokot-S 8.6-50 mg 1 tablet BID PRN constipation -If no response to above, pt instructed to take dulcolax 10 mg rectal suppository  -Pt to follow-up with GI on 08/23/15

## 2015-08-20 NOTE — Assessment & Plan Note (Addendum)
Assessment: Pt with well-controlled hypertension compliant with one-class (ARB) anti-hypertensive therapy who presents with blood pressure of 101/69.   Plan:  -BP 101/69 at goal <140/90 -Continue losartan 25 mg daily  -Last BMP on 03/29/15 was normal, repeat at next visit

## 2015-08-20 NOTE — Telephone Encounter (Signed)
Left message for patient to call back  

## 2015-08-23 ENCOUNTER — Ambulatory Visit: Payer: BLUE CROSS/BLUE SHIELD | Admitting: Physician Assistant

## 2015-08-27 NOTE — Progress Notes (Signed)
Internal Medicine Clinic Attending  Case discussed with Dr. Rabbani soon after the resident saw the patient.  We reviewed the resident's history and exam and pertinent patient test results.  I agree with the assessment, diagnosis, and plan of care documented in the resident's note.  

## 2015-09-04 ENCOUNTER — Other Ambulatory Visit: Payer: Self-pay | Admitting: Internal Medicine

## 2015-09-10 ENCOUNTER — Encounter: Payer: Self-pay | Admitting: Dietician

## 2015-09-10 ENCOUNTER — Ambulatory Visit (INDEPENDENT_AMBULATORY_CARE_PROVIDER_SITE_OTHER): Payer: BLUE CROSS/BLUE SHIELD | Admitting: Dietician

## 2015-09-10 VITALS — Wt 262.2 lb

## 2015-09-10 DIAGNOSIS — E119 Type 2 diabetes mellitus without complications: Secondary | ICD-10-CM

## 2015-09-10 DIAGNOSIS — Z713 Dietary counseling and surveillance: Secondary | ICD-10-CM | POA: Diagnosis not present

## 2015-09-10 DIAGNOSIS — E1142 Type 2 diabetes mellitus with diabetic polyneuropathy: Secondary | ICD-10-CM

## 2015-09-10 NOTE — Patient Instructions (Signed)
Eat more fruit- canned peaches, pears, bananas  Eat less cheese- look for cheese made with 2% milk  swiss, provolone, mozzerella, cheddar, munster  Try to eat more lean protein- chicken, milk, cheese, [place], fish- can try cooking is the microwave.  Please bring your meter with you next visit.

## 2015-09-10 NOTE — Progress Notes (Signed)
Medical Nutrition Therapy:  Appt start time: 5456 end time:  1430. Initial visit  Assessment:  Primary concerns today: wants help in eating healthier and that he can eat with IBS/Ischemic colitis.  Mr. Lenker has well controlled diabetes, blood pressure and cholesterol. He is in the process of moving in with his father after the recent passing of his mother and is concerned about coordinating their dietary needs. His father needing to gain weight and Mr. Albus wanting to eat healthy and eat lower calorie and heart healthy for his diabetes. He works 5 days a week in [employer] and reports that this is not a very active job. His move has added 2 hours of commuting/day to this sedentary job. His dietary recall reveals a diet that is low in fiber, fruits, vegetable and protein and high in empty calories and fat and salt.  He has trouble with acid reflux and nausea, both unrelieved by current medications. He reports having a limited budget for food, but unsure to what extent this affects his food choices. Marland Kitchen He reports having omitted some food groups ( fruits and fiber) due to past diets givien to him for his colitis. He says he does cook limited foods using mostly the microwave.   Preferred Learning Style: No preference indicated  Learning Readiness: contemplating/Ready  WEIGHT ASSESSMENT:296# to 262# in 2 years. 72", BMI is 35- Class 2 obesity SLEEP: tries to get 8 hours, is sleeping well currently MEDICATIONS: victoza, vit D , ALA, mag ox, zofran, dulcolax, bentyl, vit E 400 IU, vit B12 500 mg daily LABS: magnesium -1.5, Vit D 07/2014- 31.5 ng ( 30-100 is normal) A1C 6.5% BLOOD SUGAR: no meter today, A1C well controlled DIETARY INTAKE: Usual eating pattern includes 2-3 meals and 1 snack per day. Everyday foods include eggs.  Avoided foods include milk, fresh fruit and vegetables.   24-hr recall:  B ( 930AM): 2 poached eggs and instant flavored grits, water D ( PM): hamburger helper or can of chicken  rice or noodle soup or 1 can green beans and 1 can carrots,  Snk ( PM): chips Beverages: water, unsweet tea, crystal lite type beverage  Fast food on weekends- [employer] or [employer], quarter pounder with cheese or roast beef withchesse Usual physical activity: ADLS, currenlty cleaning apartment, work  and limited meal preparation.  Estimated daily energy needs for gradual weight loss: 2100-2300 calories 250-270g carbohydrates 95-115 g protein 80-95 g fat  Progress Towards Goal(s):  In progress.   Nutritional Diagnosis:  NB-1.1 Food and nutrition-related knowledge deficit As related to lack of sufficient prior meal planning edcuation.  As evidenced by his report and his food intake.  Many of his problems are associated with to a low nutrient density diet- magnesium vit D. B12.     Intervention:  Nutrition education about healthier options to improve his nutritional   Teaching Method Utilized: Visual, Auditory and Hands on Handouts given during visit include: Barriers to learning/adherence to lifestyle change:   Demonstrated degree of understanding via:  Teach Back   Monitoring/Evaluation:  Dietary intake, exercise, meter , and body weight in 4 week(s).

## 2015-09-15 ENCOUNTER — Other Ambulatory Visit: Payer: Self-pay | Admitting: Endocrinology

## 2015-09-16 ENCOUNTER — Other Ambulatory Visit: Payer: Self-pay | Admitting: Endocrinology

## 2015-10-10 ENCOUNTER — Ambulatory Visit: Payer: BLUE CROSS/BLUE SHIELD | Admitting: Dietician

## 2015-10-16 ENCOUNTER — Encounter: Payer: BLUE CROSS/BLUE SHIELD | Admitting: Internal Medicine

## 2015-10-25 LAB — LIPID PANEL
Cholesterol: 153 mg/dL (ref 0–200)
HDL: 38 mg/dL (ref 35–70)
LDL CALC: 87 mg/dL
TRIGLYCERIDES: 142 mg/dL (ref 40–160)

## 2015-10-25 LAB — BASIC METABOLIC PANEL: CREATININE: 1 mg/dL (ref 0.6–1.3)

## 2015-11-02 ENCOUNTER — Other Ambulatory Visit: Payer: Self-pay | Admitting: Endocrinology

## 2015-11-13 ENCOUNTER — Ambulatory Visit (INDEPENDENT_AMBULATORY_CARE_PROVIDER_SITE_OTHER): Payer: BLUE CROSS/BLUE SHIELD | Admitting: Internal Medicine

## 2015-11-13 ENCOUNTER — Encounter: Payer: Self-pay | Admitting: Internal Medicine

## 2015-11-13 VITALS — BP 117/70 | HR 82 | Temp 98.0°F | Ht 72.0 in | Wt 259.3 lb

## 2015-11-13 DIAGNOSIS — E1142 Type 2 diabetes mellitus with diabetic polyneuropathy: Secondary | ICD-10-CM | POA: Diagnosis not present

## 2015-11-13 DIAGNOSIS — F324 Major depressive disorder, single episode, in partial remission: Secondary | ICD-10-CM | POA: Insufficient documentation

## 2015-11-13 DIAGNOSIS — F4321 Adjustment disorder with depressed mood: Secondary | ICD-10-CM

## 2015-11-13 DIAGNOSIS — F329 Major depressive disorder, single episode, unspecified: Secondary | ICD-10-CM | POA: Insufficient documentation

## 2015-11-13 LAB — GLUCOSE, CAPILLARY: Glucose-Capillary: 124 mg/dL — ABNORMAL HIGH (ref 65–99)

## 2015-11-13 LAB — POCT GLYCOSYLATED HEMOGLOBIN (HGB A1C): Hemoglobin A1C: 5.9

## 2015-11-13 NOTE — Patient Instructions (Signed)
I am sorry to hear about your mother and father.  Please let us know if you would like more resources on counseling. Please call the crisis hotline should you feel the need to speak with someone if you are feeling overwhelmed.

## 2015-11-13 NOTE — Progress Notes (Signed)
Patient ID: Jack Wallace, male   DOB: [birth year], 54 y.o.   MRN: [id number]   Subjective:   [name]   DOB: May 06, 1961 54 y.o.   MRN: [id number]  HPI: Jack Wallace is a 54 y.o. male with PMH as listed below who presents for management of his diabetes. Please see problem list for status of chronic medical issues.   Past Medical History  Diagnosis Date  . Diabetes mellitus without complication ([place])   . Hypertension   . Hyperlipidemia   . Ischemic colitis ([place])    Current Outpatient Prescriptions  Medication Sig Dispense Refill  . metFORMIN (GLUCOPHAGE) 500 MG tablet TAKE 1 TO 2 TABLETS BY MOUTH TWICE DAILY WITH A MEAL 120 tablet 3  . Alpha Lipoic Acid 200 MG CAPS Take 200 mg by mouth. 2 pills daily    . Artificial Saliva (BIOTENE ORALBALANCE DRY MOUTH) GEL Apply 1/2 inch length onto tongue and spread evenly repeat as often as needed Generic okay if available 42 g 2  . aspirin 81 MG tablet Take 81 mg by mouth daily.    . bisacodyl (DULCOLAX) 10 MG suppository Place 1 suppository (10 mg total) rectally as needed for moderate constipation. 12 suppository 0  . Cholecalciferol (VITAMIN D PO) Take 1 tablet by mouth daily. D3 1000 IU    . dicyclomine (BENTYL) 20 MG tablet Take 1 tab twice daily 60 tablet 10  . gabapentin (NEURONTIN) 600 MG tablet take 1 tablet by mouth three times a day 90 tablet 3  . glucose blood (ONETOUCH VERIO) test strip Use to check blood sugar 2 times per day dx code E11.65 100 each 5  . Insulin Pen Needle (NOVOTWIST) 32G X 5 MM MISC Use one per day 100 each 3  . losartan (COZAAR) 25 MG tablet take 1 tablet by mouth once daily 30 tablet 3  . Magnesium Oxide 400 (240 MG) MG TABS Take 1 tablet by mouth 3 (three) times daily. Take 1 in the morning and 2 in the evening. 90 tablet 2  . ondansetron (ZOFRAN) 4 MG tablet Take 1 tablet (4 mg total) by mouth 2 (two) times daily as needed for nausea or vomiting. 30 tablet 1  . ONETOUCH DELICA LANCETS FINE  MISC     . pantoprazole (PROTONIX) 40 MG tablet Take 1 tablet (40 mg total) by mouth 2 (two) times daily. 60 tablet 3  . pravastatin (PRAVACHOL) 40 MG tablet Take 40 mg by mouth every morning.     . Probiotic Product (RESTORA) CAPS Take 1 capsule by mouth daily. 6 capsule 0  . senna-docusate (SENOKOT-S) 8.6-50 MG per tablet Take 1 tablet by mouth 2 (two) times daily as needed for mild constipation. (Patient not taking: Reported on 09/10/2015) 60 tablet 1  . simethicone (MYLICON) 0000000 MG chewable tablet Chew 125 mg by mouth every 6 (six) hours as needed for flatulence.    Marland Kitchen VICTOZA 18 MG/3ML SOPN inject 1.2 milligram subcutaneously daily 18 mL 1  . vitamin B-12 (CYANOCOBALAMIN) 500 MCG tablet Take 500 mcg by mouth daily.    Marland Kitchen VITAMIN E PO Take 1 capsule by mouth daily. 400 IU daily     No current facility-administered medications for this visit.   Family History  Problem Relation Age of Onset  . Hypertension Mother   . Diabetes Maternal Grandfather   . Colon cancer Neg Hx   . Throat cancer Neg Hx   . Prostate cancer Neg Hx   .  Pancreatic cancer Neg Hx   . Heart disease Neg Hx   . Kidney disease Neg Hx   . Liver disease Neg Hx    Social History   Social History  . Marital Status: Married    Spouse Name: Jack Wallace  . Number of Children: Jack Wallace  . Years of Education: Jack Wallace   Social History Main Topics  . Smoking status: Never Smoker   . Smokeless tobacco: Never Used  . Alcohol Use: No  . Drug Use: No  . Sexual Activity: Not Asked   Other Topics Concern  . None   Social History Narrative   Review of Systems: Review of Systems  Constitutional: Negative for weight loss and malaise/fatigue.  Respiratory: Negative for shortness of breath.   Cardiovascular: Negative for chest pain.  Gastrointestinal: Negative for abdominal pain.  Neurological: Negative for weakness.  Psychiatric/Behavioral: Positive for depression. Negative for suicidal ideas. The patient does not have insomnia.      Objective:  Physical Exam: Filed Vitals:   11/13/15 1457  BP: 117/70  Pulse: 82  Temp: 98 F (36.7 C)  TempSrc: Oral  Height: 6' (1.829 m)  Weight: 259 lb 4.8 oz (117.618 kg)  SpO2: 98%   Physical Exam  Constitutional: He is oriented to person, place, and time. He appears well-developed and well-nourished. No distress.  HENT:  Head: Normocephalic and atraumatic.  Cardiovascular: Normal rate and regular rhythm.   No murmur heard. Pulmonary/Chest: Effort normal and breath sounds normal. No respiratory distress. He has no wheezes. He has no rales.  Abdominal: Soft. There is no tenderness.  Musculoskeletal: Normal range of motion. He exhibits no edema or tenderness.  Neurological: He is alert and oriented to person, place, and time.  Psychiatric: He has a normal mood and affect. Thought content normal.    Assessment & Plan:  Please see problem based charting for assessment and plan.

## 2015-11-14 NOTE — Assessment & Plan Note (Signed)
Patient sees Dr. Dwyane Dee of Endocrinology for his diabetic management. He currently takes Metformin 1000 mg BID and Victoza 1.2 mg daily. His Hgb A1C on 8/25 was 6.5. This visit it is 5.9. His average blood sugars at home is 117. Averages before and after meals are: Breakfast 131-119 Lunch 108-117 Dinner 96-106  He says he does sometimes feel symptoms of low blood sugar when it is in the low 90s or high 80s. He feels weak and lightheaded when this happens, but can quickly correct with food or sugary fluids.  His Diabetes is currently well controlled, although he does report very poor appetite since the death of his mother on 2023/09/03 which may affect his home blood sugars. -Continue current medications -Continue to follow up with Dr. Dwyane Dee

## 2015-11-14 NOTE — Assessment & Plan Note (Addendum)
Patient presents with reported symptoms of feeling depressed and sad. This began after the unfortunate death of his mother on 09-15-2023, whom he was very close with. He reports crying multiple times in the two weeks following her death. Several weeks after his mother's passing, Mr. Cahan found his father at home had suffered a stroke. He has understandably had a depressed mood and feelings of sadness during this time. His father is currently in a nursing home/rehab facility, and after discussions with his family, it was decided that Mr. Fisch would live and take care of his father once he returns home on December 3rd. He feels anxious and stressed about being the sole caretaker, although he will have help from home nursing aides during his time at work. He has tried counseling through his work, but was not pleased with the sessions as the topics discussed (ex. Politics) were not helpful for him. He also sees a Designer, jewellery who has given him Prozac, initially 10 mg, now on 20 mg. Patient does not feel that this has been helpful. He has been hesitant about increasing the dose, when offered by his NP. Patient does report decreased interest in his usual hobbies (jigsaw puzzles, following the [place], favorite TV shows) and decreased appetite during this time. He says his sleep, energy, and concentration is not affected. He denies suicidal ideation or thoughts of hurting himself or others.  I discussed with patient at length that he is experiencing a normal grief reaction/situational depression which may take several months for improvement, but is otherwise self-limited. I offered to set him up with an outside grief counselor, however he declined as he is concerned that he will not have the time now that he will be caring for his father. I also spoke with him about considering an increased dose of his Prozac, and he will speak with his NP about this. He is also given information for the Crisis Hotline and  advised to call anytime he should feel the need or if he is feeling overwhelmed. He is encouraged to contact us for support as needed. We will follow up with him next month to monitor his progress and discuss his other medical issues.

## 2015-11-21 ENCOUNTER — Ambulatory Visit (INDEPENDENT_AMBULATORY_CARE_PROVIDER_SITE_OTHER): Payer: BLUE CROSS/BLUE SHIELD | Admitting: Endocrinology

## 2015-11-21 ENCOUNTER — Encounter: Payer: Self-pay | Admitting: Endocrinology

## 2015-11-21 VITALS — BP 116/78 | HR 76 | Temp 98.2°F | Resp 14 | Ht 73.0 in | Wt 265.2 lb

## 2015-11-21 DIAGNOSIS — E1142 Type 2 diabetes mellitus with diabetic polyneuropathy: Secondary | ICD-10-CM | POA: Diagnosis not present

## 2015-11-21 DIAGNOSIS — E119 Type 2 diabetes mellitus without complications: Secondary | ICD-10-CM | POA: Diagnosis not present

## 2015-11-21 NOTE — Patient Instructions (Signed)
Check blood sugars on waking up 3  times a week  Also check blood sugars about 2 hours after a meal and do this after different meals by rotation  Recommended blood sugar levels on waking up is 90-130 and about 2 hours after meal is 130-160  Please bring your blood sugar monitor to each visit, thank you  

## 2015-11-21 NOTE — Progress Notes (Signed)
Patient ID: Jack Wallace, male   DOB: May 05, 1961, 54 y.o.   MRN: [id number]   Reason for Appointment : Followup of Type 2 Diabetes   History of Present Illness            Diagnosis: Type 2 diabetes mellitus, date of diagnosis: 1993         Past diabetes history: He has been treated with various drugs for his diabetes over the last several years. He has been taking metformin for at least 10 years and has been tolerating this. Over the years he has had additional medications to improve his control. He was also taking Actos previously and not clear if he had any side effects. This was stopped because of fear of long-term effects The last 5 years he was on glipizide also. Previously was taking 10 mg twice a day.   About 2 years ago his blood sugars were poorly controlled with readings up to 300 In early 2014 he started walking and lost 15 pounds. Subsequently he was getting low blood sugar during the day especially before lunch and sometimes in the afternoon His glipizide was  reduced but even with 5 mg twice a day he was getting hypoglycemia.  This was reduced and Invokana added. Invokana was stopped because of lack of clear benefit in 09/2013 Because of inadequate control he was started on Victoza on his visit in 10/14 in addition to his metformin and glipizide.  Recent history:   With Victoza and metformin his blood sugars have been fairly consistently controlled and A1c has been below 7% consistently It is now 5.9 although at work who to 6.4 from "a month ago He is tolerating Victoza without any nausea, continues to take 1.2 mg  Current blood sugar patterns and management:  He is checking his blood sugar is fairly consistently at various times, 1-2 times a day  Fasting blood sugars are somewhat high and appear to be higher than those in the evening, this is not unusual  He thinks he is checking his blood sugars after meals although has only a few readings after 7 PM  He  has not been able to walk recently because of living further away from his work  Has not been paying attention to  meal planning and is gaining some weight, he says he is now living with his father and has to take care of him in his home  Oral hypoglycemic drugs: Metformin 1500 mg daily  Monitors blood glucose:  1.8 times a day        Glucometer:  One Touch Verio Blood Glucose readings from meter download:   Mean values apply above for all meters except median for One Touch  PRE-MEAL Fasting Lunch Dinner  PCS  Overall  Glucose range: 119-143  100-141   92-102   93-134    Mean/median:  124     115    Hypoglycemia : None    The diet that the patient has been following is: None recently   Meals: 2 meals per day.  usually eating a frozen meal at breakfast 9 am and supper 4pm and only crackers at lunch.  Will have more snacks  sometimes after supper Dietician visit: Most recent:?  Wt Readings from Last 3 Encounters:  11/21/15 265 lb 3.2 oz (120.294 kg)  11/13/15 259 lb 4.8 oz (117.618 kg)  09/10/15 262 lb 3.2 oz (118.933 kg)      Physical activity: exercise: No specific exercise,  active  at work at times         Lab Results  Component Value Date   HGBA1C 5.9 11/13/2015   HGBA1C 6.5 08/15/2015   HGBA1C 6.6* 05/09/2015   Lab Results  Component Value Date   MICROALBUR <0.2 03/29/2015   [place] 86 05/09/2015   CREATININE 0.89 03/29/2015       Medication List       This list is accurate as of: 11/21/15  5:15 PM.  Always use your most recent med list.               Alpha Lipoic Acid 200 MG Caps  Take 200 mg by mouth. 2 pills daily     aspirin 81 MG tablet  Take 81 mg by mouth daily.     BIOTENE ORALBALANCE DRY MOUTH Gel  Apply 1/2 inch length onto tongue and spread evenly repeat as often as needed Generic okay if available     bisacodyl 10 MG suppository  Commonly known as:  DULCOLAX  Place 1 suppository (10 mg total) rectally as needed for moderate  constipation.     dicyclomine 20 MG tablet  Commonly known as:  BENTYL  Take 1 tab twice daily     FLUARIX QUADRIVALENT 0.5 ML injection  Generic drug:  Influenza vac split quadrivalent PF  inject 0.5 milliliter intramuscularly     FLUoxetine 20 MG capsule  Commonly known as:  PROZAC  Take 20 mg by mouth daily.     gabapentin 600 MG tablet  Commonly known as:  NEURONTIN  take 1 tablet by mouth three times a day     glucose blood test strip  Commonly known as:  ONETOUCH VERIO  Use to check blood sugar 2 times per day dx code E11.65     Insulin Pen Needle 32G X 5 MM Misc  Commonly known as:  NOVOTWIST  Use one per day     losartan 25 MG tablet  Commonly known as:  COZAAR  take 1 tablet by mouth once daily     Magnesium Oxide 400 (240 MG) MG Tabs  Take 1 tablet by mouth 3 (three) times daily. Take 1 in the morning and 2 in the evening.     metFORMIN 500 MG tablet  Commonly known as:  GLUCOPHAGE  TAKE 1 TO 2 TABLETS BY MOUTH TWICE DAILY WITH A MEAL     multivitamin with minerals Tabs tablet  Take 1 tablet by mouth daily.     ondansetron 4 MG tablet  Commonly known as:  ZOFRAN  Take 1 tablet (4 mg total) by mouth 2 (two) times daily as needed for nausea or vomiting.     ONETOUCH DELICA LANCETS FINE Misc     pantoprazole 40 MG tablet  Commonly known as:  PROTONIX  Take 1 tablet (40 mg total) by mouth 2 (two) times daily.     pravastatin 40 MG tablet  Commonly known as:  PRAVACHOL  Take 40 mg by mouth every morning.     RESTORA Caps  Take 1 capsule by mouth daily.     senna-docusate 8.6-50 MG tablet  Commonly known as:  Senokot-S  Take 1 tablet by mouth 2 (two) times daily as needed for mild constipation.     simethicone 125 MG chewable tablet  Commonly known as:  MYLICON  Chew 0000000 mg by mouth every 6 (six) hours as needed for flatulence.     VICTOZA 18 MG/3ML Sopn  Generic drug:  Liraglutide  inject  1.2 milligram subcutaneously daily     vitamin B-12 500  MCG tablet  Commonly known as:  CYANOCOBALAMIN  Take 500 mcg by mouth daily.     VITAMIN D PO  Take 1 tablet by mouth daily. D3 1000 IU     VITAMIN E PO  Take 1 capsule by mouth daily. 400 IU daily        Allergies: No Known Allergies  Past Medical History  Diagnosis Date  . Diabetes mellitus without complication ([place])   . Hypertension   . Hyperlipidemia   . Ischemic colitis [facility])     Past Surgical History  Procedure Laterality Date  . Nasal septum surgery    . Colonoscopy  06/01/2014    Family History  Problem Relation Age of Onset  . Hypertension Mother   . Diabetes Maternal Grandfather   . Colon cancer Neg Hx   . Throat cancer Neg Hx   . Prostate cancer Neg Hx   . Pancreatic cancer Neg Hx   . Heart disease Neg Hx   . Kidney disease Neg Hx   . Liver disease Neg Hx     Social History:  reports that he has never smoked. He has never used smokeless tobacco. He reports that he does not drink alcohol or use illicit drugs.    Review of Systems      Hyperlipidemia: taking pravastatin  40 mg for several years. .Lipids  show LDL 86.  Usually has regular monitoring with lab work at work  Lab Results  Component Value Date   [place] 86 05/09/2015        The blood pressure has been  been treated with any medications since his hospitalization for probable ischemic bowel when he had hypotension also Checking at home or work periodically but he thinks that his blood pressure is fluctuating significantly and sometimes as high as 90 diastolic   He has chronic mild hypomagnesemia which is asymptomatic, no muscle cramps and is taking 1000 mg a day of supplements; magnesium is now normal  Still having problems with nausea and has not been evaluated or prescribed anything by his PCP   HYPERTENSION: His blood pressure at home is 120/80 or lower  on 25 mg of losartan   He has had symptoms of neuropathy in feet.   He mostly had burning which would be worse when he would try  to drive.   He still takes gabapentin 600 mg 2 bid  Foot exam last: 04/2015  Physical Examination:  BP 116/78 mmHg  Pulse 76  Temp(Src) 98.2 F (36.8 C)  Resp 14  Ht 6\' 1"  (1.854 m)  Wt 265 lb 3.2 oz (120.294 kg)  BMI 35.00 kg/m2  SpO2 95%  No pedal edema   ASSESSMENT/PLAN:   Diabetes type 2:  Blood sugars are well controlled with A1c 5.9 with PCP and 6.4 a month ago from outside lab Although he is not doing very well with his meal planning and gaining a little weight his control is still excellent with Victoza on metformin Tolerating these well Encouraged him to try and be better compliant with diet and start walking program when he can     HYPERTENSION: His blood pressure is normal and he will continue losartan  HYPERLIPIDEMIA: Well controlled with pravastatin  Patient Instructions  Check blood sugars on waking up  3  times a week Also check blood sugars about 2 hours after a meal and do this after different meals by rotation  Recommended  blood sugar levels on waking up is 90-130 and about 2 hours after meal is 130-160  Please bring your blood sugar monitor to each visit, thank you        [facility] 11/21/2015, 5:15 PM

## 2015-11-22 ENCOUNTER — Encounter: Payer: Self-pay | Admitting: *Deleted

## 2015-11-26 NOTE — Progress Notes (Signed)
Internal Medicine Clinic Attending  I saw and evaluated the patient.  I personally confirmed the key portions of the history and exam documented by Dr. Patel,Vishal and I reviewed pertinent patient test results.  The assessment, diagnosis, and plan were formulated together and I agree with the documentation in the resident's note.  

## 2015-12-04 ENCOUNTER — Ambulatory Visit: Payer: BLUE CROSS/BLUE SHIELD | Admitting: Endocrinology

## 2016-01-06 ENCOUNTER — Other Ambulatory Visit: Payer: Self-pay | Admitting: Internal Medicine

## 2016-01-09 ENCOUNTER — Other Ambulatory Visit: Payer: Self-pay | Admitting: Internal Medicine

## 2016-01-13 ENCOUNTER — Encounter: Payer: Self-pay | Admitting: Internal Medicine

## 2016-01-13 ENCOUNTER — Ambulatory Visit (INDEPENDENT_AMBULATORY_CARE_PROVIDER_SITE_OTHER): Payer: BLUE CROSS/BLUE SHIELD | Admitting: Internal Medicine

## 2016-01-13 VITALS — BP 119/68 | HR 78 | Temp 98.2°F | Wt 272.5 lb

## 2016-01-13 DIAGNOSIS — F4321 Adjustment disorder with depressed mood: Secondary | ICD-10-CM | POA: Diagnosis not present

## 2016-01-13 MED ORDER — FLUOXETINE HCL 40 MG PO CAPS: 40.0000 mg | ORAL_CAPSULE | Freq: Every day | ORAL | Status: DC

## 2016-01-13 NOTE — Patient Instructions (Signed)
1. Take Prozac 40 mg daily. 2. Visit [email or web address].com to find a psychologist in your area.  You can call and see if they are available and if they take your insurance. 3. Return to clinic in 8 weeks. 4. As always, call the crisis line or 911 when needed.  Major Depressive Disorder Major depressive disorder is a mental illness. It also may be called clinical depression or unipolar depression. Major depressive disorder usually causes feelings of sadness, hopelessness, or helplessness. Some people with this disorder do not feel particularly sad but lose interest in doing things they used to enjoy (anhedonia). Major depressive disorder also can cause physical symptoms. It can interfere with work, school, relationships, and other normal everyday activities. The disorder varies in severity but is longer lasting and more serious than the sadness we all feel from time to time in our lives. Major depressive disorder often is triggered by stressful life events or major life changes. Examples of these triggers include divorce, loss of your job or home, a move, and the death of a family member or close friend. Sometimes this disorder occurs for no obvious reason at all. People who have family members with major depressive disorder or bipolar disorder are at higher risk for developing this disorder, with or without life stressors. Major depressive disorder can occur at any age. It may occur just once in your life (single episode major depressive disorder). It may occur multiple times (recurrent major depressive disorder). SYMPTOMS People with major depressive disorder have either anhedonia or depressed mood on nearly a daily basis for at least 2 weeks or longer. Symptoms of depressed mood include:  Feelings of sadness (blue or down in the dumps) or emptiness.  Feelings of hopelessness or helplessness.  Tearfulness or episodes of crying (may be observed by others).  Irritability (children and  adolescents). In addition to depressed mood or anhedonia or both, people with this disorder have at least four of the following symptoms:  Difficulty sleeping or sleeping too much.   Significant change (increase or decrease) in appetite or weight.   Lack of energy or motivation.  Feelings of guilt and worthlessness.   Difficulty concentrating, remembering, or making decisions.  Unusually slow movement (psychomotor retardation) or restlessness (as observed by others).   Recurrent wishes for death, recurrent thoughts of self-harm (suicide), or a suicide attempt. People with major depressive disorder commonly have persistent negative thoughts about themselves, other people, and the world. People with severe major depressive disorder may experiencedistorted beliefs or perceptions about the world (psychotic delusions). They also may see or hear things that are not real (psychotic hallucinations). DIAGNOSIS Major depressive disorder is diagnosed through an assessment by your health care provider. Your health care provider will ask aboutaspects of your daily life, such as mood,sleep, and appetite, to see if you have the diagnostic symptoms of major depressive disorder. Your health care provider may ask about your medical history and use of alcohol or drugs, including prescription medicines. Your health care provider also may do a physical exam and blood work. This is because certain medical conditions and the use of certain substances can cause major depressive disorder-like symptoms (secondary depression). Your health care provider also may refer you to a mental health specialist for further evaluation and treatment. TREATMENT It is important to recognize the symptoms of major depressive disorder and seek treatment. The following treatments can be prescribed for this disorder:   Medicine. Antidepressant medicines usually are prescribed. Antidepressant medicines are thought to correct chemical  imbalances in the brain that are commonly associated with major depressive disorder. Other types of medicine may be added if the symptoms do not respond to antidepressant medicines alone or if psychotic delusions or hallucinations occur.  Talk therapy. Talk therapy can be helpful in treating major depressive disorder by providing support, education, and guidance. Certain types of talk therapy also can help with negative thinking (cognitive behavioral therapy) and with relationship issues that trigger this disorder (interpersonal therapy). A mental health specialist can help determine which treatment is best for you. Most people with major depressive disorder do well with a combination of medicine and talk therapy. Treatments involving electrical stimulation of the brain can be used in situations with extremely severe symptoms or when medicine and talk therapy do not work over time. These treatments include electroconvulsive therapy, transcranial magnetic stimulation, and vagal nerve stimulation.   This information is not intended to replace advice given to you by your health care provider. Make sure you discuss any questions you have with your health care provider.   Document Released: 04/03/2013 Document Revised: 12/28/2014 Document Reviewed: 04/03/2013 Elsevier Interactive Patient Education [employer].

## 2016-01-13 NOTE — Progress Notes (Signed)
Patient ID: Jack Wallace, male   DOB: [birth year], 55 y.o.   MRN: [id number]   Subjective:   [name]   DOB: July 25, 1961 55 y.o.   MRN: [id number]  HPI: [name] is a 55 y.o. male with PMH as below, here for f/u grief and depression.  Please see Problem-Based charting for the status of the patient's chronic medical issues.     Past Medical History  Diagnosis Date  . Diabetes mellitus without complication ([place])   . Hypertension   . Hyperlipidemia   . Ischemic colitis [facility])    Current Outpatient Prescriptions  Medication Sig Dispense Refill  . Alpha Lipoic Acid 200 MG CAPS Take 200 mg by mouth. 2 pills daily    . Artificial Saliva (BIOTENE ORALBALANCE DRY MOUTH) GEL Apply 1/2 inch length onto tongue and spread evenly repeat as often as needed Generic okay if available 42 g 2  . aspirin 81 MG tablet Take 81 mg by mouth daily.    . bisacodyl (DULCOLAX) 10 MG suppository Place 1 suppository (10 mg total) rectally as needed for moderate constipation. (Patient not taking: Reported on 11/21/2015) 12 suppository 0  . Cholecalciferol (VITAMIN D PO) Take 1 tablet by mouth daily. D3 1000 IU    . dicyclomine (BENTYL) 20 MG tablet Take 1 tab twice daily 60 tablet 10  . FLUARIX QUADRIVALENT 0.5 ML injection inject 0.5 milliliter intramuscularly  0  . FLUoxetine (PROZAC) 20 MG capsule Take 20 mg by mouth daily.    Marland Kitchen gabapentin (NEURONTIN) 600 MG tablet take 1 tablet by mouth three times a day 90 tablet 3  . glucose blood (ONETOUCH VERIO) test strip Use to check blood sugar 2 times per day dx code E11.65 100 each 5  . Insulin Pen Needle (NOVOTWIST) 32G X 5 MM MISC Use one per day 100 each 3  . losartan (COZAAR) 25 MG tablet take 1 tablet by mouth once daily 30 tablet 3  . Magnesium Oxide 400 (240 MG) MG TABS Take 1 tablet by mouth 3 (three) times daily. Take 1 in the morning and 2 in the evening. 90 tablet 2  . metFORMIN (GLUCOPHAGE) 500 MG tablet take 1-2 tablets by mouth  twice a day with meals 120 tablet 2  . Multiple Vitamin (MULTIVITAMIN WITH MINERALS) TABS tablet Take 1 tablet by mouth daily.    . ondansetron (ZOFRAN) 4 MG tablet Take 1 tablet (4 mg total) by mouth 2 (two) times daily as needed for nausea or vomiting. (Patient not taking: Reported on 11/21/2015) 30 tablet 1  . ONETOUCH DELICA LANCETS FINE MISC     . pantoprazole (PROTONIX) 40 MG tablet take 1 tablet by mouth twice a day 60 tablet 2  . pravastatin (PRAVACHOL) 40 MG tablet Take 40 mg by mouth every morning.     . Probiotic Product (RESTORA) CAPS Take 1 capsule by mouth daily. 6 capsule 0  . senna-docusate (SENOKOT-S) 8.6-50 MG per tablet Take 1 tablet by mouth 2 (two) times daily as needed for mild constipation. 60 tablet 1  . simethicone (MYLICON) 0000000 MG chewable tablet Chew 125 mg by mouth every 6 (six) hours as needed for flatulence.    Marland Kitchen VICTOZA 18 MG/3ML SOPN inject 1.2 milligram subcutaneously daily 18 mL 1  . vitamin B-12 (CYANOCOBALAMIN) 500 MCG tablet Take 500 mcg by mouth daily.    Marland Kitchen VITAMIN E PO Take 1 capsule by mouth daily. 400 IU daily     No  current facility-administered medications for this visit.   Family History  Problem Relation Age of Onset  . Hypertension Mother   . Diabetes Maternal Grandfather   . Colon cancer Neg Hx   . Throat cancer Neg Hx   . Prostate cancer Neg Hx   . Pancreatic cancer Neg Hx   . Heart disease Neg Hx   . Kidney disease Neg Hx   . Liver disease Neg Hx    Social History   Social History  . Marital Status: Married    Spouse Name: N/A  . Number of Children: N/A  . Years of Education: N/A   Social History Main Topics  . Smoking status: Never Smoker   . Smokeless tobacco: Never Used  . Alcohol Use: No  . Drug Use: No  . Sexual Activity: Not on file   Other Topics Concern  . Not on file   Social History Narrative   Review of Systems: No SI or HI.  Positive for anhedonia, decreased sleep, and weight gain. Objective:  Physical  Exam: There were no vitals filed for this visit. Physical Exam  Constitutional: He is oriented to person, place, and time.  Overweight male, appears sad, NAD  HENT:  Head: Normocephalic and atraumatic.  Eyes: EOM are normal.  Cardiovascular: Normal rate, regular rhythm and normal heart sounds.   Pulmonary/Chest: Effort normal and breath sounds normal. No respiratory distress. He has no wheezes.  Neurological: He is alert and oriented to person, place, and time.  Skin: Skin is warm and dry.  Psychiatric:  Mood: "not so good" Affect: depressed, but responsive and interactive.     Assessment & Plan:   Patient and case were discussed with Dr. Evette Doffing.  Please refer to Problem Based charting for further documentation.

## 2016-01-13 NOTE — Assessment & Plan Note (Signed)
Patient continues to express sadness, anhedonia, weight gain, and decreased sleep since the death of his mother 31-Aug-2023) and his father's stroke.  Symptoms worsened after patient decided he could not care for his father by himself and had to trick him into going into an ALF in [place].  He state's he is "alone," and has nobody to turn to for help.  He has passive SI (his mother's quick death was the "better end of the deal").  He denies active SI, plan, or means (no guns).  He has been on Prozac 20 mg since November without relief.  He has not tried to go to counseling again since a useless attempt while at work.  A/P: Symptoms now more consistent with Major Depression than acute grief.  He continues to have symptoms on Prozac 20 mg. PHQ9 is 14.  Patient amenable to increasing Prozac dose and establishing care with counselor.  If patient fails to respond to Prozac 40 mg, will switch to different SSRI at next visit. - Prozac 40 mg daily - Establish with psychologist/counselor - RTC 8 weeks

## 2016-01-15 NOTE — Progress Notes (Signed)
Internal Medicine Clinic Attending  Case discussed with Dr. Taylor at the time of the visit.  We reviewed the resident's history and exam and pertinent patient test results.  I agree with the assessment, diagnosis, and plan of care documented in the resident's note. 

## 2016-01-16 ENCOUNTER — Other Ambulatory Visit: Payer: Self-pay | Admitting: Endocrinology

## 2016-01-21 ENCOUNTER — Encounter: Payer: Self-pay | Admitting: *Deleted

## 2016-02-13 LAB — HM DIABETES EYE EXAM

## 2016-02-17 LAB — HEMOGLOBIN A1C: HEMOGLOBIN A1C: 6.8

## 2016-02-19 ENCOUNTER — Telehealth: Payer: Self-pay | Admitting: Internal Medicine

## 2016-02-19 ENCOUNTER — Encounter: Payer: Self-pay | Admitting: *Deleted

## 2016-02-19 ENCOUNTER — Ambulatory Visit: Payer: BLUE CROSS/BLUE SHIELD | Admitting: Endocrinology

## 2016-02-19 NOTE — Telephone Encounter (Deleted)
Patient is calling to give his sister's number  To contact him.  [phone] regarding his xanax medications.

## 2016-02-19 NOTE — Telephone Encounter (Signed)
Called this ph#, it is landon vaughn's sister's home not [place]

## 2016-02-20 ENCOUNTER — Encounter: Payer: Self-pay | Admitting: Endocrinology

## 2016-02-20 ENCOUNTER — Ambulatory Visit (INDEPENDENT_AMBULATORY_CARE_PROVIDER_SITE_OTHER): Payer: BLUE CROSS/BLUE SHIELD | Admitting: Endocrinology

## 2016-02-20 VITALS — BP 126/90 | HR 85 | Temp 98.0°F | Resp 16 | Ht 73.0 in | Wt 278.4 lb

## 2016-02-20 DIAGNOSIS — E119 Type 2 diabetes mellitus without complications: Secondary | ICD-10-CM | POA: Diagnosis not present

## 2016-02-20 NOTE — Patient Instructions (Signed)
Restart diet,  exercise  Check blood sugars on waking up 2  times a week Also check blood sugars about 2 hours after a meal and do this after different meals by rotation  Recommended blood sugar levels on waking up is 90-130 and about 2 hours after meal is 130-160  Please bring your blood sugar monitor to each visit, thank you

## 2016-02-20 NOTE — Progress Notes (Signed)
Patient ID: Jack Wallace, male   DOB: [birth year], 55 y.o.   MRN: [id number]   Reason for Appointment : Followup of Type 2 Diabetes   History of Present Illness            Diagnosis: Type 2 diabetes mellitus, date of diagnosis: 1993         Past diabetes history: He has been treated with various drugs for his diabetes over the last several years. He has been taking metformin for at least 10 years and has been tolerating this. Over the years he has had additional medications to improve his control. He was also taking Actos previously and not clear if he had any side effects. This was stopped because of fear of long-term effects The last 5 years he was on glipizide also. Previously was taking 10 mg twice a day.   About 2 years ago his blood sugars were poorly controlled with readings up to 300 In early 2014 he started walking and lost 15 pounds. Subsequently he was getting low blood sugar during the day especially before lunch and sometimes in the afternoon His glipizide was  reduced but even with 5 mg twice a day he was getting hypoglycemia.  This was reduced and Invokana added. Invokana was stopped because of lack of clear benefit in 09/2013 Because of inadequate control he was started on Victoza on his visit in 10/14 in addition to his metformin and glipizide.  Recent history:   With Victoza and metformin his blood sugars have been previously fairly consistently controlled However A1c is now relatively higher at 6.8, previously 6.4 from the same lab  Current problems, blood sugar patterns and management:  He is gaining a significant amount of weight  He thinks that most of his weekends related to relative inactivity and eating out a lot as well as not planning his meals.  He has had to deal with family stress also  Fasting blood sugars are usually somewhat high and was 182 on his labs but he has not checked any in the morning at home  His blood sugars in the evenings  recently are mostly near normal  Oral hypoglycemic drugs: Metformin 1500 mg daily  Monitors blood glucose:  0.7 times a day        Glucometer:  One Touch Verio Blood Glucose readings from meter download:   RANGE 95-193 with median reading 114 Most readings are between 12 noon and 6 PM, some after meals   Hypoglycemia : None    The diet that the patient has been following is: None recently   Meals: 2 meals per day.  usually eating a frozen meal at breakfast 9 am and supper 4pm and only crackers at lunch.  Getting more snacks  sometimes after supper  Dietician visit: Most recent:?  Wt Readings from Last 3 Encounters:  02/20/16 278 lb 6.4 oz (126.281 kg)  01/13/16 272 lb 8 oz (123.605 kg)  11/21/15 265 lb 3.2 oz (120.294 kg)      Physical activity: exercise: No specific exercise,  active at work at times         Lab Results  Component Value Date   HGBA1C 6.8 02/17/2016   HGBA1C 5.9 11/13/2015   HGBA1C 6.5 08/15/2015   Lab Results  Component Value Date   MICROALBUR <0.2 03/29/2015   LDLCALC 87 10/25/2015   CREATININE 1.0 10/25/2015       Medication List       This list is  accurate as of: 02/20/16  9:33 PM.  Always use your most recent med list.               Alpha Lipoic Acid 200 MG Caps  Take 200 mg by mouth. 2 pills daily     aspirin 81 MG tablet  Take 81 mg by mouth daily.     BIOTENE ORALBALANCE DRY MOUTH Gel  Apply 1/2 inch length onto tongue and spread evenly repeat as often as needed Generic okay if available     dicyclomine 20 MG tablet  Commonly known as:  BENTYL  Take 1 tab twice daily     FLUARIX QUADRIVALENT 0.5 ML injection  Generic drug:  Influenza vac split quadrivalent PF  inject 0.5 milliliter intramuscularly     FLUoxetine 40 MG capsule  Commonly known as:  PROZAC  Take 1 capsule (40 mg total) by mouth daily.     gabapentin 600 MG tablet  Commonly known as:  NEURONTIN  take 1 tablet by mouth three times a day     glucose blood  test strip  Commonly known as:  ONETOUCH VERIO  Use to check blood sugar 2 times per day dx code E11.65     Insulin Pen Needle 32G X 5 MM Misc  Commonly known as:  NOVOTWIST  Use one per day     losartan 25 MG tablet  Commonly known as:  COZAAR  take 1 tablet by mouth once daily     Magnesium Oxide 400 (240 Mg) MG Tabs  Take 1 tablet by mouth 3 (three) times daily. Take 1 in the morning and 2 in the evening.     metFORMIN 500 MG tablet  Commonly known as:  GLUCOPHAGE  take 1-2 tablets by mouth twice a day with meals     multivitamin with minerals Tabs tablet  Take 1 tablet by mouth daily.     ONETOUCH DELICA LANCETS FINE Misc     pantoprazole 40 MG tablet  Commonly known as:  PROTONIX  take 1 tablet by mouth twice a day     pravastatin 40 MG tablet  Commonly known as:  PRAVACHOL  Take 40 mg by mouth every morning.     RESTORA Caps  Take 1 capsule by mouth daily.     senna-docusate 8.6-50 MG tablet  Commonly known as:  Senokot-S  Take 1 tablet by mouth 2 (two) times daily as needed for mild constipation.     VICTOZA 18 MG/3ML Sopn  Generic drug:  Liraglutide  inject 1.2 milligram subcutaneously daily     vitamin B-12 500 MCG tablet  Commonly known as:  CYANOCOBALAMIN  Take 500 mcg by mouth daily.     VITAMIN D PO  Take 1 tablet by mouth daily. D3 1000 IU     VITAMIN E PO  Take 1 capsule by mouth daily. 400 IU daily        Allergies: No Known Allergies  Past Medical History  Diagnosis Date  . Diabetes mellitus without complication ([place])   . Hypertension   . Hyperlipidemia   . Ischemic colitis [facility])     Past Surgical History  Procedure Laterality Date  . Nasal septum surgery    . Colonoscopy  06/01/2014    Family History  Problem Relation Age of Onset  . Hypertension Mother   . Diabetes Maternal Grandfather   . Colon cancer Neg Hx   . Throat cancer Neg Hx   . Prostate cancer Neg Hx   .  Pancreatic cancer Neg Hx   . Heart disease Neg Hx   .  Kidney disease Neg Hx   . Liver disease Neg Hx     Social History:  reports that he has never smoked. He has never used smokeless tobacco. He reports that he does not drink alcohol or use illicit drugs.    Review of Systems   His blood pressures were unusually high today but he thinks this is from a stressful day He is only taking 25 mg losartan Has been followed by PCP     Hyperlipidemia: taking pravastatin  40 mg for several years. .Lipids  show LDL 86.  Usually has regular monitoring with lab work at work  Lab Results  Component Value Date   CHOL 153 10/25/2015   HDL 38 10/25/2015   LDLCALC 87 10/25/2015   TRIG 142 10/25/2015      He has chronic mild hypomagnesemia which is asymptomatic, no muscle cramps and is taking 1000 mg a day of supplements; magnesium is now normal   He has had symptoms of neuropathy in feet.   He mostly had burning which would be worse when he would try to drive.   He still takes gabapentin 600 mg 2 bid  Foot exam last: 04/2015  Physical Examination:  BP 126/90 mmHg  Pulse 85  Temp(Src) 98 F (36.7 C)  Resp 16  Ht 6\' 1"  (1.854 m)  Wt 278 lb 6.4 oz (126.281 kg)  BMI 36.74 kg/m2  SpO2 95%  Repeat blood pressure 128/86   ASSESSMENT/PLAN:   Diabetes type 2:  Blood sugars are somewhat worse with A1c relatively higher However even though his fasting reading in the lab was 182 his readings in the evenings are usually close to normal He may be having higher fasting readings which he is not monitoring His currently planning to start watching his diet better which he has not done as well as start walking in the park Discussed need to lose the weight that he has gained Also may consider increasing Victoza dose if needed  Considering adding Amaryl 1 mg at bedtime if fasting readings continue to be high and he will call if needed  HYPERTENSION: Blood pressure is unusually high today, will defer management to PCP  HYPERLIPIDEMIA: Well  controlled with pravastatin  Patient Instructions  Restart diet,  exercise  Check blood sugars on waking up 2  times a week Also check blood sugars about 2 hours after a meal and do this after different meals by rotation  Recommended blood sugar levels on waking up is 90-130 and about 2 hours after meal is 130-160  Please bring your blood sugar monitor to each visit, thank you        [facility] 02/20/2016, 9:33 PM

## 2016-03-03 ENCOUNTER — Telehealth: Payer: Self-pay | Admitting: Internal Medicine

## 2016-03-03 ENCOUNTER — Other Ambulatory Visit: Payer: Self-pay | Admitting: Endocrinology

## 2016-03-03 NOTE — Telephone Encounter (Signed)
APPT. REMINDER CALL, LMTCB °

## 2016-03-04 ENCOUNTER — Ambulatory Visit (INDEPENDENT_AMBULATORY_CARE_PROVIDER_SITE_OTHER): Payer: BLUE CROSS/BLUE SHIELD | Admitting: Internal Medicine

## 2016-03-04 VITALS — BP 128/79 | HR 81 | Temp 98.2°F | Wt 280.9 lb

## 2016-03-04 DIAGNOSIS — F4321 Adjustment disorder with depressed mood: Secondary | ICD-10-CM | POA: Diagnosis not present

## 2016-03-04 DIAGNOSIS — E119 Type 2 diabetes mellitus without complications: Secondary | ICD-10-CM | POA: Diagnosis not present

## 2016-03-04 DIAGNOSIS — I10 Essential (primary) hypertension: Secondary | ICD-10-CM

## 2016-03-04 DIAGNOSIS — Z7984 Long term (current) use of oral hypoglycemic drugs: Secondary | ICD-10-CM

## 2016-03-04 DIAGNOSIS — Z79899 Other long term (current) drug therapy: Secondary | ICD-10-CM

## 2016-03-04 DIAGNOSIS — E1142 Type 2 diabetes mellitus with diabetic polyneuropathy: Secondary | ICD-10-CM

## 2016-03-04 MED ORDER — SERTRALINE HCL 50 MG PO TABS: 50.0000 mg | ORAL_TABLET | Freq: Every day | ORAL | Status: DC

## 2016-03-04 NOTE — Progress Notes (Signed)
Patient ID: Jack Wallace, male   DOB: 1961/04/01, 55 y.o.   MRN: [id number]   Subjective:   [name]   DOB: 1961/02/27 55 y.o.   MRN: [id number]  HPI: Jack Wallace is a 55 y.o. male with PMH as listed below who presents for management of grief/depression and HTN. Please see problem list for status of patient's chronic medical issues.   Past Medical History  Diagnosis Date  . Diabetes mellitus without complication ([place])   . Hypertension   . Hyperlipidemia   . Ischemic colitis [facility])    Current Outpatient Prescriptions  Medication Sig Dispense Refill  . Alpha Lipoic Acid 200 MG CAPS Take 200 mg by mouth. 2 pills daily    . Artificial Saliva (BIOTENE ORALBALANCE DRY MOUTH) GEL Apply 1/2 inch length onto tongue and spread evenly repeat as often as needed Generic okay if available 42 g 2  . aspirin 81 MG tablet Take 81 mg by mouth daily.    . Cholecalciferol (VITAMIN D PO) Take 1 tablet by mouth daily. D3 1000 IU    . dicyclomine (BENTYL) 20 MG tablet Take 1 tab twice daily 60 tablet 10  . FLUARIX QUADRIVALENT 0.5 ML injection inject 0.5 milliliter intramuscularly  0  . FLUoxetine (PROZAC) 40 MG capsule Take 1 capsule (40 mg total) by mouth daily. 30 capsule 3  . gabapentin (NEURONTIN) 600 MG tablet take 1 tablet by mouth three times a day 90 tablet 1  . glucose blood (ONETOUCH VERIO) test strip Use to check blood sugar 2 times per day dx code E11.65 100 each 5  . Insulin Pen Needle (NOVOTWIST) 32G X 5 MM MISC Use one per day 100 each 3  . losartan (COZAAR) 25 MG tablet take 1 tablet by mouth once daily 30 tablet 2  . Magnesium Oxide 400 (240 MG) MG TABS Take 1 tablet by mouth 3 (three) times daily. Take 1 in the morning and 2 in the evening. 90 tablet 2  . metFORMIN (GLUCOPHAGE) 500 MG tablet take 1-2 tablets by mouth twice a day with meals 120 tablet 2  . Multiple Vitamin (MULTIVITAMIN WITH MINERALS) TABS tablet Take 1 tablet by mouth daily.    Glory Rosebush DELICA  LANCETS FINE MISC     . pantoprazole (PROTONIX) 40 MG tablet take 1 tablet by mouth twice a day 60 tablet 2  . pravastatin (PRAVACHOL) 40 MG tablet Take 40 mg by mouth every morning.     . Probiotic Product (RESTORA) CAPS Take 1 capsule by mouth daily. 6 capsule 0  . senna-docusate (SENOKOT-S) 8.6-50 MG per tablet Take 1 tablet by mouth 2 (two) times daily as needed for mild constipation. 60 tablet 1  . VICTOZA 18 MG/3ML SOPN inject 1.2 milligram subcutaneously daily 18 mL 1  . vitamin B-12 (CYANOCOBALAMIN) 500 MCG tablet Take 500 mcg by mouth daily.    Marland Kitchen VITAMIN E PO Take 1 capsule by mouth daily. 400 IU daily     No current facility-administered medications for this visit.   Family History  Problem Relation Age of Onset  . Hypertension Mother   . Diabetes Maternal Grandfather   . Colon cancer Neg Hx   . Throat cancer Neg Hx   . Prostate cancer Neg Hx   . Pancreatic cancer Neg Hx   . Heart disease Neg Hx   . Kidney disease Neg Hx   . Liver disease Neg Hx    Social History   Social  History  . Marital Status: Married    Spouse Name: N/A  . Number of Children: N/A  . Years of Education: N/A   Social History Main Topics  . Smoking status: Never Smoker   . Smokeless tobacco: Never Used  . Alcohol Use: No  . Drug Use: No  . Sexual Activity: Not on file   Other Topics Concern  . Not on file   Social History Narrative   Review of Systems: Review of Systems  Psychiatric/Behavioral: Positive for depression. Negative for suicidal ideas, hallucinations and substance abuse.       Anhedonia, guilt, erratic sleep pattern, decreased appetite, low energy and mood    Objective:  Physical Exam: Filed Vitals:   03/04/16 1419  BP: 128/79  Pulse: 81  Temp: 98.2 F (36.8 C)  TempSrc: Oral  Weight: 280 lb 14.4 oz (127.415 kg)  SpO2: 97%   Physical Exam  Constitutional: He is oriented to person, place, and time.  Obese male, no acute distress  HENT:  Head: Normocephalic and  atraumatic.  Eyes: EOM are normal.  Cardiovascular: Normal rate and regular rhythm.  Exam reveals no gallop and no friction rub.   No murmur heard. Pulmonary/Chest: Effort normal. No respiratory distress. He has no wheezes. He has no rales.  Abdominal: Soft. Bowel sounds are normal.  Neurological: He is alert and oriented to person, place, and time.  Psychiatric: His speech is normal and behavior is normal. He exhibits a depressed mood.  Appears sad, interactive and communicates well    Assessment & Plan:  Please see problem based charting for assessment and plan.

## 2016-03-04 NOTE — Patient Instructions (Signed)
We will switch your Prozac to another medication in the same class called Zoloft (sertraline). Please stop taking the Prozac when you begin the Zoloft.  We will try to find a counselor/therapist in the area for you to speak with. You can also visit [email or web address] to find a psychologist in your area. You can call and see if they are available and if they take your insurance.  As always, please call the crisis hotline or 911 when needed.

## 2016-03-05 ENCOUNTER — Telehealth: Payer: Self-pay | Admitting: Licensed Clinical Social Worker

## 2016-03-05 NOTE — Assessment & Plan Note (Signed)
Blood pressure well-controlled on current medication. BP is 128/79 this clinic visit.  A/P: BP at goal <140/90. -Continue Losartan 25 mg daily

## 2016-03-05 NOTE — Progress Notes (Signed)
Internal Medicine Clinic Attending  Case discussed with Dr. Patel,Vishal at the time of the visit.  We reviewed the resident's history and exam and pertinent patient test results.  I agree with the assessment, diagnosis, and plan of care documented in the resident's note.  

## 2016-03-05 NOTE — Assessment & Plan Note (Signed)
On last clinic visit, patient's Prozac was increased from 20 mg to 40 mg daily. He continues to have feelings of sadness, erratic sleep pattern, anhedonia, and low energy since the death of his mother in Sep 01, 2023. He does not think the increase in Prozac has helped or shown any noticeable difference in his symptoms. He also feels guilty about having to trick his father into going to a nursing home after he was unable to care for him on his own. He says he feels like his father resents him now and that it is like he "lost both parents." He was advised to seek counseling/psychologist, but says he has not been motivated and busy moving into a new apartment. He lives alone now and says that a lot of his furniture used to belong to his mother. He also says that he has boxes of his mother's old things which he is trying to get rid of but feels like he is "throwing away a piece of her." He also says he had flu-like symptoms about 2 weeks ago, but did not feel like going to the ED after his co-worker suggested he needed to be seen by a physician. He says he felt that he would be ok if he would have died. He denies any active suicidal or homicidal ideation.  A/P: Patient with continued symptoms of depression over 6 months with PHQ9 of 16 (14 on last visit) suggests moderately severe depression. He has no response to Prozac 40 mg daily. He is willing to try counseling and would like to see someone in the [place] area or with the [facility] system. He is also willing to try a different SSRI.  -d/c Prozac -Start Zoloft 50 mg qd, can titrate up as necesarry -Social work consult for therapy/counseling -f/u in 8 weeks

## 2016-03-05 NOTE — Telephone Encounter (Signed)
Jack Wallace was referred to CSW for grief counseling.  CSW utilized pt's [employer] network to obtain providers that specialize in Grief Counseling in the [place] area.  CSW placed called to pt.  CSW left message requesting return call. CSW provided contact hours and phone number.  Network Providers specializing in Grief Therapy include: Boris Lown LCSW, [facility], [facility], [facility] and Jahmir Poag PhD.  CSW will provide referral information above as well as Hospice and [place].

## 2016-03-05 NOTE — Assessment & Plan Note (Signed)
Patient follows with Dr. Dwyane Dee of Endocrinology for his diabetic management. He continues to take Metformin 1000 mg BID and Victoza 1.2 mg qd. Most recent A1C on 02/17/16 is 6.8 compared to 5.9 in November. He denies any hyperglycemic or hypoglycemic episodes/symptoms. We did not discuss his diabetes in depth today as most of the discussion was related to his depression.  A/P: Patient with fair control of diabetes, but worsening A1C compared to prior. -Continue current medications -Continue to follow with Dr. Dwyane Dee

## 2016-03-05 NOTE — Telephone Encounter (Signed)
Jack Wallace returned call to [facility].  Pt states he does not do well in group setting and would prefer one on one counseling.  Jack Wallace has no gender or agency preference.  CSW informed Jack Wallace of several agencies.  Pt requesting CSW to make referral and if possible to schedule on his behalf appointment after 3pm.

## 2016-03-06 NOTE — Telephone Encounter (Signed)
CSW placed call to [facility] [phone], message left

## 2016-03-13 NOTE — Telephone Encounter (Signed)
CSW received returned call from [facility], call back number [phone].  CSW returned call, message left inquiring about new patients, grief counseling services and fax number to fax potential referral.

## 2016-03-16 NOTE — Telephone Encounter (Signed)
Difficulty reaching [facility].  CSW will make referral to Boris Lown, as CSW has utilized this provider with quick response.   Referral to [facility] [address], [place], [place]  [phone].  Letter mailed to Mr. Labriola.

## 2016-03-17 NOTE — Telephone Encounter (Signed)
CSW received return call from Provider, Boris Lown.  Provider reaching out to Mr. Ambroise today.

## 2016-03-20 ENCOUNTER — Other Ambulatory Visit: Payer: Self-pay | Admitting: Endocrinology

## 2016-03-29 ENCOUNTER — Other Ambulatory Visit: Payer: Self-pay | Admitting: Internal Medicine

## 2016-03-30 ENCOUNTER — Other Ambulatory Visit: Payer: Self-pay | Admitting: Internal Medicine

## 2016-04-10 ENCOUNTER — Other Ambulatory Visit: Payer: Self-pay | Admitting: Endocrinology

## 2016-04-12 ENCOUNTER — Other Ambulatory Visit: Payer: Self-pay | Admitting: Internal Medicine

## 2016-04-28 ENCOUNTER — Other Ambulatory Visit: Payer: Self-pay | Admitting: Endocrinology

## 2016-05-07 IMAGING — NM NM HEPATO W/GB/PHARM/[PERSON_NAME]
3 series · 18 of 18 positions shown · non-contrast
Comparison: Ultrasound, 05/10/2014

CLINICAL DATA: Abdominal pain

EXAM:
NUCLEAR MEDICINE HEPATOBILIARY IMAGING WITH GALLBLADDER EF
TECHNIQUE: Sequential images of the abdomen were obtained [DATE] minutes
following intravenous administration of radiopharmaceutical. After
slow intravenous infusion of 2.5 micrograms Cholecystokinin,
gallbladder ejection fraction was determined.
RADIOPHARMACEUTICALS:  5.0 mIiTc-KKm Choletec

[he hepatobiliary · 3.43mm/px · 6 of 30 frames shown (1 of 3)]
[frame 3/30]
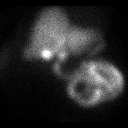
[frame 8/30]
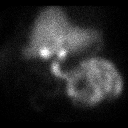
[frame 13/30]
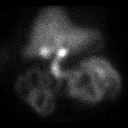
[frame 18/30]
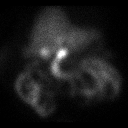
[frame 23/30]
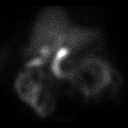
[frame 28/30]
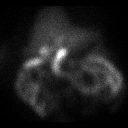

[he hepatobiliary · 3.43mm/px · 6 of 25 frames shown (2 of 3)]
[frame 3/25]
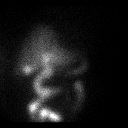
[frame 7/25]
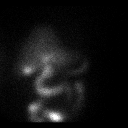
[frame 11/25]
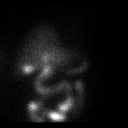
[frame 15/25]
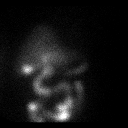
[frame 19/25]
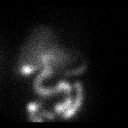
[frame 23/25]
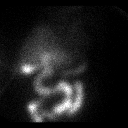

[he hepatobiliary · 3.43mm/px · 6 of 60 frames shown (3 of 3)]
[frame 6/60]
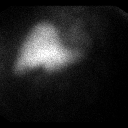
[frame 16/60]
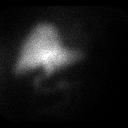
[frame 26/60]
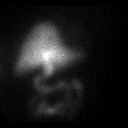
[frame 36/60]
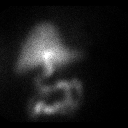
[frame 46/60]
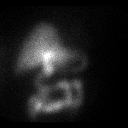
[frame 56/60]
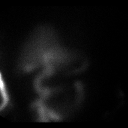

[18 of 18 positions shown; findings below may reference images not displayed]

FINDINGS: There is homogeneous accumulation of radiotracer by the liver.
Prompt excretion was noted into the intra and extrahepatic biliary
tree. Small bowel activity seen early during the study, and
gallbladder activity seen later during the exam indicating patency
of the common bile duct and cystic duct respectively.

Gallbladder function was then assessed with gallbladder contraction
stimulated with a CCK infusion. Gallbladder contraction is within
the normal range with an ejection fraction calculated at 44%. At 30
min, normal ejection fraction is greater than 30%.

The patient did not experience symptoms during CCK infusion.
IMPRESSION: Normal hepatobiliary imaging with normal gallbladder function.

## 2016-05-27 ENCOUNTER — Ambulatory Visit (INDEPENDENT_AMBULATORY_CARE_PROVIDER_SITE_OTHER): Payer: BLUE CROSS/BLUE SHIELD | Admitting: Internal Medicine

## 2016-05-27 ENCOUNTER — Encounter: Payer: Self-pay | Admitting: Internal Medicine

## 2016-05-27 VITALS — BP 129/80 | HR 84 | Temp 98.2°F | Wt 287.3 lb

## 2016-05-27 DIAGNOSIS — Z7984 Long term (current) use of oral hypoglycemic drugs: Secondary | ICD-10-CM | POA: Diagnosis not present

## 2016-05-27 DIAGNOSIS — F329 Major depressive disorder, single episode, unspecified: Secondary | ICD-10-CM | POA: Diagnosis not present

## 2016-05-27 DIAGNOSIS — E119 Type 2 diabetes mellitus without complications: Secondary | ICD-10-CM

## 2016-05-27 DIAGNOSIS — F321 Major depressive disorder, single episode, moderate: Secondary | ICD-10-CM

## 2016-05-27 DIAGNOSIS — I10 Essential (primary) hypertension: Secondary | ICD-10-CM

## 2016-05-27 DIAGNOSIS — Z79899 Other long term (current) drug therapy: Secondary | ICD-10-CM

## 2016-05-27 DIAGNOSIS — E1142 Type 2 diabetes mellitus with diabetic polyneuropathy: Secondary | ICD-10-CM

## 2016-05-27 LAB — GLUCOSE, CAPILLARY: GLUCOSE-CAPILLARY: 116 mg/dL — AB (ref 65–99)

## 2016-05-27 LAB — POCT GLYCOSYLATED HEMOGLOBIN (HGB A1C): Hemoglobin A1C: 6.9

## 2016-05-27 MED ORDER — SERTRALINE HCL 100 MG PO TABS: 100.0000 mg | ORAL_TABLET | Freq: Every day | ORAL | Status: DC

## 2016-05-27 NOTE — Progress Notes (Signed)
Patient ID: Jack Wallace, male   DOB: Oct 22, 1961, 55 y.o.   MRN: [id number]   Subjective:   [name]   DOB: [birth year] 55 y.o.   MRN: [id number]  HPI: [name] is a 55 y.o. male with PMH as listed below who presents for management of his depression and HTN. Please see problem list for status of patient's chronic medical issues.    Past Medical History  Diagnosis Date  . Diabetes mellitus without complication ([place])   . Hypertension   . Hyperlipidemia   . Ischemic colitis [facility])    Current Outpatient Prescriptions  Medication Sig Dispense Refill  . Alpha Lipoic Acid 200 MG CAPS Take 200 mg by mouth. 2 pills daily    . Artificial Saliva (BIOTENE ORALBALANCE DRY MOUTH) GEL Apply 1/2 inch length onto tongue and spread evenly repeat as often as needed Generic okay if available 42 g 2  . aspirin 81 MG tablet Take 81 mg by mouth daily.    . Cholecalciferol (VITAMIN D PO) Take 1 tablet by mouth daily. D3 1000 IU    . dicyclomine (BENTYL) 20 MG tablet Take 1 tab twice daily 60 tablet 10  . FLUARIX QUADRIVALENT 0.5 ML injection inject 0.5 milliliter intramuscularly  0  . gabapentin (NEURONTIN) 600 MG tablet take 1 tablet by mouth three times a day 90 tablet 1  . Insulin Pen Needle (NOVOTWIST) 32G X 5 MM MISC Use one per day 100 each 3  . losartan (COZAAR) 25 MG tablet take 1 tablet by mouth daily 30 tablet 5  . Magnesium Oxide 400 (240 MG) MG TABS Take 1 tablet by mouth 3 (three) times daily. Take 1 in the morning and 2 in the evening. 90 tablet 2  . metFORMIN (GLUCOPHAGE) 500 MG tablet take 1 to 2 tablets by mouth twice a day with meals 120 tablet 1  . Multiple Vitamin (MULTIVITAMIN WITH MINERALS) TABS tablet Take 1 tablet by mouth daily.    Glory Rosebush DELICA LANCETS FINE MISC     . ONETOUCH VERIO test strip TEST twice a day 100 each 2  . pantoprazole (PROTONIX) 40 MG tablet take 1 tablet by mouth twice a day 180 tablet 1  . pravastatin (PRAVACHOL) 40 MG tablet  Take 40 mg by mouth every morning.     . Probiotic Product (RESTORA) CAPS Take 1 capsule by mouth daily. 6 capsule 0  . senna-docusate (SENOKOT-S) 8.6-50 MG per tablet Take 1 tablet by mouth 2 (two) times daily as needed for mild constipation. 60 tablet 1  . sertraline (ZOLOFT) 100 MG tablet Take 1 tablet (100 mg total) by mouth daily. 30 tablet 2  . VICTOZA 18 MG/3ML SOPN inject 1.2 milligram subcutaneously daily 18 mL 1  . vitamin B-12 (CYANOCOBALAMIN) 500 MCG tablet Take 500 mcg by mouth daily.    Marland Kitchen VITAMIN E PO Take 1 capsule by mouth daily. 400 IU daily     No current facility-administered medications for this visit.   Family History  Problem Relation Age of Onset  . Hypertension Mother   . Diabetes Maternal Grandfather   . Colon cancer Neg Hx   . Throat cancer Neg Hx   . Prostate cancer Neg Hx   . Pancreatic cancer Neg Hx   . Heart disease Neg Hx   . Kidney disease Neg Hx   . Liver disease Neg Hx    Social History   Social History  . Marital Status: Married  Spouse Name: N/A  . Number of Children: N/A  . Years of Education: N/A   Social History Main Topics  . Smoking status: Never Smoker   . Smokeless tobacco: Never Used  . Alcohol Use: No  . Drug Use: No  . Sexual Activity: Not Asked   Other Topics Concern  . None   Social History Narrative   Review of Systems: Review of Systems  Respiratory: Negative for shortness of breath.   Cardiovascular: Negative for chest pain and leg swelling.  Gastrointestinal: Negative for abdominal pain.  Neurological: Negative for loss of consciousness.  Psychiatric/Behavioral: Positive for depression. Negative for suicidal ideas.    Objective:  Physical Exam: Filed Vitals:   05/27/16 1510  BP: 129/80  Pulse: 84  Temp: 98.2 F (36.8 C)  TempSrc: Oral  Weight: 287 lb 4.8 oz (130.318 kg)  SpO2: 97%   Physical Exam  Constitutional: He is oriented to person, place, and time. He appears well-developed and well-nourished.  No distress.  Pleasant, obese gentleman  HENT:  Head: Normocephalic and atraumatic.  Cardiovascular: Normal rate and regular rhythm.   +2 DP pulses  Pulmonary/Chest: Effort normal. He has no wheezes. He has no rales.  Abdominal: Soft. There is no tenderness.  Musculoskeletal: Normal range of motion. He exhibits no edema.  Neurological: He is alert and oriented to person, place, and time.  Skin: He is not diaphoretic.  Psychiatric: He has a normal mood and affect.    Assessment & Plan:  Please see problem based charting for assessment and plan.

## 2016-05-27 NOTE — Patient Instructions (Signed)
It was a pleasure to see you again [name].  We will increase your Zoloft to 100 mg daily. I have sent a prescription to your pharmacy. Please call us in 1-2 weeks if you do not feel any improvement with this dose.  Please continue your other medications as prescribed and follow up with Dr. Dwyane Dee.  Continue walking as tolerated. Exercise will help with your diabetic control and with your mood as well. Try ice packs for aches you have after walking.  Follow up with me in the next 2 months.

## 2016-05-29 NOTE — Assessment & Plan Note (Signed)
BP Readings from Last 3 Encounters:  05/27/16 129/80  03/04/16 128/79  02/20/16 126/90   Patient reports compliance to Losartan 25 mg daily.  A/P: Well controlled -Continue Losartan 25 mg daily

## 2016-05-29 NOTE — Assessment & Plan Note (Signed)
Patient follows with Dr. Dwyane Dee of Endocrinology. He reports adherence to Metformin 1000 mg BID and Victoza 1.2 mg qd. Hgb A1C this visit is 6.9 compared to 6.8 on 01/2716. He reports checking his fasting glucose in the morning and is surprised at how high his levels are. He denies any hypo or hyperglycemic episodes or symptoms. He has been walking more recently, up to 1.5 miles.   A/P: Diabetic control is stable, however his A1C was notably 5.9 in November. Patient will follow up with Dr. Dwyane Dee next month who has planned to either increase his Victoza dose or add Amaryl if adjustment in medications is needed. -f/u with Dr. Dwyane Dee -Continue current medications -Continue lifestyle modifications

## 2016-05-29 NOTE — Assessment & Plan Note (Signed)
On last clinic visit, patient was switched from Prozac to Zoloft at a starting dose of 50 mg daily. He reports adherence to this. He feels that not much has changed since our last visit. He does tell me he has been seeing a woman recently and that his coworkers have noticed that he seems more "chipper" recently. He does occasionally visit his father at the nursing facility and has some guilt about his placement there. However, he does feel that it is the best situation for his dad. He still feels sad when he thinks about his mom, but states that he only thinks about happy memories. He also feels that some things in his life have inadvertently turned out better since his mom's passing such as inheriting her car. He was referred for counseling on last visit. He says he went to two sessions, but did not feel comfortable "opening up" and decided that he did not want to continue those sessions. He would like to try increasing the Zoloft dose and return to clinic in the next 1-2 months.  A/P: Patient with continued depression. Despite initially feeling that not much has changed, he admits that things might be improving. I applauded him on taking steps to be more social and that whether or not his newfound relationship lasts, he is making progress. He denies any suicidal ideation. He has also started walking up to 1.5 miles. I encouraged him to continue this as it can improve his mood and he agrees, saying he felt better when walking and listening to music. PHQ-9 this visit is 9 (high moderate range), improved from 16 on last visit. Will titrate up home Zoloft and ask patient to call us in 1-2 weeks to determine if further up-titration should be considered. -Increase Zoloft from 50 mg daily to 100 mg daily, can titrate up if needed -Advised patient to report on symptomatic improvement in 1-2 weeks -Continue with exercise -f/u in about 2 months

## 2016-06-01 NOTE — Progress Notes (Signed)
Internal Medicine Clinic Attending  Case discussed with Dr. Patel,Vishal at the time of the visit.  We reviewed the resident's history and exam and pertinent patient test results.  I agree with the assessment, diagnosis, and plan of care documented in the resident's note.  

## 2016-06-15 ENCOUNTER — Other Ambulatory Visit: Payer: Self-pay | Admitting: Internal Medicine

## 2016-06-16 ENCOUNTER — Telehealth: Payer: Self-pay | Admitting: *Deleted

## 2016-06-16 DIAGNOSIS — F321 Major depressive disorder, single episode, moderate: Secondary | ICD-10-CM

## 2016-06-16 MED ORDER — SERTRALINE HCL 100 MG PO TABS: 150.0000 mg | ORAL_TABLET | Freq: Every day | ORAL | Status: DC

## 2016-06-16 NOTE — Telephone Encounter (Signed)
WALK IN Pt presents to say he thinks he needs to go up on his med, he states he's having a hard time right now because his father is saying he is lonely and he is in cary with pt being here, he would like for his father to move closer to him but his brother has power of atty and has not agreed to their father moving to [employer], he is going to talk to him again about it but their relationship is not that good so he is worried the brother will not agree.otherwise he continues to walk and the rest of his life is stable. Please send a new script if you are able to increase his med Thank you

## 2016-06-16 NOTE — Telephone Encounter (Signed)
Increasing his Sertraline (Zoloft) is appropriate as we discussed on his last visit. Will send in prescription for increased dose of 150 mg daily (1.5 tablets) from 100 mg daily. Please let me know if there are any issues.

## 2016-06-17 NOTE — Telephone Encounter (Signed)
Pt.notified

## 2016-06-19 ENCOUNTER — Other Ambulatory Visit: Payer: Self-pay | Admitting: Endocrinology

## 2016-06-29 ENCOUNTER — Other Ambulatory Visit: Payer: Self-pay | Admitting: Endocrinology

## 2016-06-29 ENCOUNTER — Ambulatory Visit (INDEPENDENT_AMBULATORY_CARE_PROVIDER_SITE_OTHER): Payer: BLUE CROSS/BLUE SHIELD | Admitting: Endocrinology

## 2016-06-29 VITALS — BP 112/78 | HR 80 | Ht 73.0 in | Wt 287.0 lb

## 2016-06-29 DIAGNOSIS — E1165 Type 2 diabetes mellitus with hyperglycemia: Secondary | ICD-10-CM | POA: Diagnosis not present

## 2016-06-29 MED ORDER — CANAGLIFLOZIN 100 MG PO TABS: ORAL_TABLET | ORAL | Status: DC

## 2016-06-29 NOTE — Telephone Encounter (Signed)
Reorder patient Neurontin, sent to patient pharmacy. Last seen 05/27/16

## 2016-06-29 NOTE — Patient Instructions (Addendum)
Stop Losartan with Invokana  Check blood sugars on waking up   times a week Also check blood sugars about 2 hours after a meal and do this after different meals by rotation  Recommended blood sugar levels on waking up is 90-130 and about 2 hours after meal is 130-160  Please bring your blood sugar monitor to each visit, thank you

## 2016-06-29 NOTE — Progress Notes (Signed)
Patient ID: Jack Wallace, male   DOB: 1961-10-02, 55 y.o.   MRN: [id number]   Reason for Appointment : Followup of Type 2 Diabetes   History of Present Illness            Diagnosis: Type 2 diabetes mellitus, date of diagnosis: 1993         Past diabetes history: He has been treated with various drugs for his diabetes over the last several years. He has been taking metformin for at least 10 years and has been tolerating this. Over the years he has had additional medications to improve his control. He was also taking Actos previously and not clear if he had any side effects. This was stopped because of fear of long-term effects The last 5 years he was on glipizide also. Previously was taking 10 mg twice a day.   About 2 years ago his blood sugars were poorly controlled with readings up to 300 In early 2014 he started walking and lost 15 pounds. Subsequently he was getting low blood sugar during the day especially before lunch and sometimes in the afternoon His glipizide was  reduced but even with 5 mg twice a day he was getting hypoglycemia.  This was reduced and Invokana added. Invokana was stopped because of lack of clear benefit in 09/2013 Because of inadequate control he was started on Victoza on his visit in 10/14 in addition to his metformin and glipizide.  Recent history:   With Victoza and metformin his blood sugars have been previously fairly consistently controlled However A1c is now relatively higher at 7% from outside lab, previously had been 6.4  Current problems, blood sugar patterns and management:  He is gaining more weight  He does not like to exercise because of hot weather  He has no side effects with metformin and Victoza  Fasting blood sugars are usually high and not improved  Usually blood sugars are not high later in the day but not checking much after meals in the evening  His blood sugars in the evenings recently are mostly near normal  Oral  hypoglycemic drugs: Metformin 1500 mg daily  Monitors blood glucose:  0.7 times a day        Glucometer:  One Touch Verio Blood Glucose readings from meter download:   Mean values apply above for all meters except median for One Touch  PRE-MEAL Fasting Lunch Dinner Bedtime Overall  Glucose range: 142-184  101-205  95-134     Mean/median: 158     145    POST-MEAL PC Breakfast PC Lunch PC Dinner  Glucose range:   106-191   Mean/median:       Hypoglycemia : None    The diet that the patient has been following is: None recently   Meals: 2 meals per day.  usually eating a frozen meal at breakfast 9 am and supper 4pm and only crackers at lunch.  Getting more snacks  sometimes after supper  Dietician visit: Most recent:?  Wt Readings from Last 3 Encounters:  06/29/16 287 lb (130.182 kg)  05/27/16 287 lb 4.8 oz (130.318 kg)  03/04/16 280 lb 14.4 oz (127.415 kg)      Physical activity: exercise: No specific exercise,  active at work at times         Lab Results  Component Value Date   HGBA1C 6.9 05/27/2016   HGBA1C 6.8 02/17/2016   HGBA1C 5.9 11/13/2015   Lab Results  Component Value Date  MICROALBUR <0.2 03/29/2015   LDLCALC 87 10/25/2015   CREATININE 1.0 10/25/2015       Medication List       This list is accurate as of: 06/29/16  9:27 PM.  Always use your most recent med list.               Alpha Lipoic Acid 200 MG Caps  Take 200 mg by mouth. 2 pills daily     aspirin 81 MG tablet  Take 81 mg by mouth daily.     BIOTENE ORALBALANCE DRY MOUTH Gel  Apply 1/2 inch length onto tongue and spread evenly repeat as often as needed Generic okay if available     canagliflozin 100 MG Tabs tablet  Commonly known as:  INVOKANA  1 tablet before breakfast     dicyclomine 20 MG tablet  Commonly known as:  BENTYL  Take 1 tab twice daily     gabapentin 600 MG tablet  Commonly known as:  NEURONTIN  take 1 tablet by mouth three times a day     losartan 25 MG  tablet  Commonly known as:  COZAAR  take 1 tablet by mouth daily     Magnesium Oxide 400 (240 Mg) MG Tabs  Take 1 tablet by mouth 3 (three) times daily. Take 1 in the morning and 2 in the evening.     metFORMIN 1000 MG tablet  Commonly known as:  GLUCOPHAGE  Take 1 tablet (1,000 mg total) by mouth 2 (two) times daily with a meal.     multivitamin with minerals Tabs tablet  Take 1 tablet by mouth daily.     NOVOTWIST 32G X 5 MM Misc  Generic drug:  Insulin Pen Needle  use once daily     ONETOUCH DELICA LANCETS FINE Misc     ONETOUCH VERIO test strip  Generic drug:  glucose blood  TEST twice a day     pantoprazole 40 MG tablet  Commonly known as:  PROTONIX  take 1 tablet by mouth twice a day     pravastatin 40 MG tablet  Commonly known as:  PRAVACHOL  Take 40 mg by mouth every morning.     RESTORA Caps  Take 1 capsule by mouth daily.     sertraline 100 MG tablet  Commonly known as:  ZOLOFT  Take 1.5 tablets (150 mg total) by mouth daily.     VICTOZA 18 MG/3ML Sopn  Generic drug:  Liraglutide  inject 1.2 milligram subcutaneously daily     vitamin B-12 500 MCG tablet  Commonly known as:  CYANOCOBALAMIN  Take 500 mcg by mouth daily.     VITAMIN D PO  Take 1 tablet by mouth daily. D3 1000 IU     VITAMIN E PO  Take 1 capsule by mouth daily. 400 IU daily        Allergies: No Known Allergies  Past Medical History  Diagnosis Date  . Diabetes mellitus without complication ([place])   . Hypertension   . Hyperlipidemia   . Ischemic colitis [facility])     Past Surgical History  Procedure Laterality Date  . Nasal septum surgery    . Colonoscopy  06/01/2014    Family History  Problem Relation Age of Onset  . Hypertension Mother   . Diabetes Maternal Grandfather   . Colon cancer Neg Hx   . Throat cancer Neg Hx   . Prostate cancer Neg Hx   . Pancreatic cancer Neg Hx   .  Heart disease Neg Hx   . Kidney disease Neg Hx   . Liver disease Neg Hx     Social History:   reports that he has never smoked. He has never used smokeless tobacco. He reports that he does not drink alcohol or use illicit drugs.    Review of Systems   He is only taking 25 mg losartan for mild hypertension Has been followed by PCP     Hyperlipidemia: taking pravastatin  40 mg for several years. .Lipids  show LDL 96.  Usually has regular monitoring with lab work at work  Lab Results  Component Value Date   CHOL 153 10/25/2015   HDL 38 10/25/2015   LDLCALC 87 10/25/2015   TRIG 142 10/25/2015     HYPOMAGNESEMIA: His level is low at 1.4 from [facility].  This is despite taking 1500 mg of supplement  He has had symptoms of neuropathy in feet.   He mostly had burning   He still takes gabapentin 600 mg tid with relief now  Foot exam last: 04/2015  Physical Examination:  BP 112/78 mmHg  Pulse 80  Ht 6\' 1"  (1.854 m)  Wt 287 lb (130.182 kg)  BMI 37.87 kg/m2  SpO2 94%    ASSESSMENT/PLAN:   Diabetes type 2:  See history of present illness for detailed discussion of current diabetes management, blood sugar patterns and problems identified With his continued weight gain his A1c is still relatively high compared to readings as low as 6.4 before Also his fasting readings are persistently high Since he does need weight loss he will benefit from a drug like Invokana  Discussed action of SGLT 2 drugs on lowering glucose by decreasing kidney absorption of glucose, benefits of weight loss and lower blood pressure, possible side effects including candidiasis and dosage regimen  He will start with 100 mg daily, patient information brochure given  HYPERTENSION: Blood pressure is controlled with 25 mg losartan but will not need this with starting Invokana  HYPERLIPIDEMIA: Well controlled with pravastatin  Patient Instructions  Stop Losartan with Invokana  Check blood sugars on waking up   times a week Also check blood sugars about 2 hours after a meal and do this after different  meals by rotation  Recommended blood sugar levels on waking up is 90-130 and about 2 hours after meal is 130-160  Please bring your blood sugar monitor to each visit, thank you        [facility] 06/29/2016, 9:27 PM

## 2016-07-20 ENCOUNTER — Telehealth: Payer: Self-pay

## 2016-07-20 NOTE — Telephone Encounter (Signed)
Nausea is from double dose Metformin, he can be switched to Metformin Er 500 mg 2x daily if he likes. Try taking Invokana with full meal May restart Losartan as before

## 2016-07-20 NOTE — Telephone Encounter (Signed)
Pt came to the office today to report several medication issues he is having. Pt stated about 5 weeks ago he went to his pharmacy, [facility] and picked his metformin up. Pt was given metformin 1000 mg  from Dr. Zada Finders with directions to take it 1 tab bid. Pt stated he had not been informed about the medication change and has been taking 1000 2 tabs twice daily. Pt stated he was taking it just like his 500 mg rx had been written. Pt also stated after starting the invokana he has been experiencing nausea that can last up to 24 hrs. Pt stated since he stopped his bp medication due to the invokana start his bp has been running in the 140s to 150s for the top number and 90 for the bottom.   Pt wanted to know if we could change his metformin back to it original dosage and if we can give him an additional medication to help with his nausea. Also, should he be prescribed another bp med?  Please advise, Thanks!

## 2016-07-21 MED ORDER — METFORMIN HCL 500 MG PO TABS: ORAL_TABLET | ORAL | 5 refills | Status: DC

## 2016-07-21 NOTE — Telephone Encounter (Signed)
I contacted the pt and advised of Md's instructions below via vm. Requested a call back if the pt would like to discuss. Rx for metformin 500mg  sent to the pharmacy.

## 2016-07-26 DIAGNOSIS — Z23 Encounter for immunization: Secondary | ICD-10-CM | POA: Diagnosis not present

## 2016-07-29 ENCOUNTER — Ambulatory Visit (INDEPENDENT_AMBULATORY_CARE_PROVIDER_SITE_OTHER): Payer: BLUE CROSS/BLUE SHIELD | Admitting: Internal Medicine

## 2016-07-29 ENCOUNTER — Encounter: Payer: Self-pay | Admitting: Internal Medicine

## 2016-07-29 VITALS — BP 128/75 | HR 74 | Temp 98.2°F | Wt 289.0 lb

## 2016-07-29 DIAGNOSIS — E119 Type 2 diabetes mellitus without complications: Secondary | ICD-10-CM

## 2016-07-29 DIAGNOSIS — Z7984 Long term (current) use of oral hypoglycemic drugs: Secondary | ICD-10-CM

## 2016-07-29 DIAGNOSIS — F329 Major depressive disorder, single episode, unspecified: Secondary | ICD-10-CM | POA: Diagnosis not present

## 2016-07-29 DIAGNOSIS — F321 Major depressive disorder, single episode, moderate: Secondary | ICD-10-CM

## 2016-07-29 DIAGNOSIS — I10 Essential (primary) hypertension: Secondary | ICD-10-CM

## 2016-07-29 DIAGNOSIS — Z79899 Other long term (current) drug therapy: Secondary | ICD-10-CM

## 2016-07-29 DIAGNOSIS — E1142 Type 2 diabetes mellitus with diabetic polyneuropathy: Secondary | ICD-10-CM

## 2016-07-29 MED ORDER — SERTRALINE HCL 100 MG PO TABS: 200.0000 mg | ORAL_TABLET | Freq: Every day | ORAL | 2 refills | Status: DC

## 2016-07-29 NOTE — Assessment & Plan Note (Signed)
Patient with continued symptoms of depression, subjectively unchanged since last visit. Has been taking Zoloft 150 mg daily for 5 weeks. He is agreeable to titrating this up further. He will go to the [facility] tomorrow to join and resume his exercise regimen. He has tried counseling/therapy before and has not felt comfortable opening up those sessions and does not want to try this again. We discussed the possibility for adding a second agent for depression and ultimately  behavioral health referral in the future if we are unable to find some improvement, which he understands. PHQ-9 is 12 today. -Increase Zoloft to 200 mg daily (max dose) -May need to consider second agent/behavioral health referral in the future -Start exercise at the [facility] -f/u in 2 months

## 2016-07-29 NOTE — Patient Instructions (Signed)
It was a pleasure to see you again [name].  We will increase your Zoloft to 200 mg once daily. You can pick up the refill when you are out of your current pills. (take two 100 mg tablets daily).  Please try to restart your exercise regimen. I think going to the [facility] is a great idea.  Follow up with me in 2 months.

## 2016-07-29 NOTE — Assessment & Plan Note (Signed)
Patient follows with Dr. Dwyane Dee for his diabetes. He was recently started on Invokana 100 mg daily. He was apparently taking double his Metformin dose (2000 mg BID rather than 1000 mg BID). This has been corrected and is now taking Metformin ER 500 mg two tablets twice daily. Last Hgb A1C was 7.0 on 06/18/16. -Continue current medications -Start exercise at the [facility]

## 2016-07-29 NOTE — Assessment & Plan Note (Signed)
Patient was taken off his Losartan 25 mg daily when his Invokana was started due to risk for hypotension. Patient reported increased home BPs with systolic in [id number] and diastolic 0000000. He called Dr. Ronnie Derby office and was advised he can restart the Losartan as before which he has done. BP is 128/75 today. -Continue Losartan 25 mg daily

## 2016-07-29 NOTE — Progress Notes (Signed)
   CC: Depression  HPI:  [name] is a 54 y.o. male with PMH of T2DM, HTN, and depression who presents for follow up management of his depression. Please see problem list for status of patients chronic medical issues.  Patient has been depressed since the death of his mother in 09/20/2015 and his father suffering a stroke. He was on Prozac without improvement. This as transitioned to Zoloft and titrated up to 150 mg in June 2017 which he has been taking at home. He reports feeling about the same as last visit, missing his mother and feels like he is only able to think about the past. He visits his father at a nursing facility and has noticed his father's health deteriorating. He wishes things could go back to the way they were before. He lives alone and feels that he has nothing going on in his life other than work. He is not having active suicidal or homicidal ideation. He was previously walking for exercise, but this has decreased with inability to tolerate the hot humid weather. He has expressed interest in joining the [facility] and tells me will go to sign up tomorrow. His PHQ-9 scores last two clinic visits were 16 and 9. PHQ-9 today is 12.  Patient follows with Dr. Dwyane Wallace for his diabetes. He was recently started on Invokana 100 mg daily. He was apparently taking double his Metformin dose (2000 mg BID rather than 1000 mg BID). This has been corrected and is now taking Metformin ER 500 mg two tablets twice daily. Last Hgb A1C was 7.0 on 06/18/16.  Patient was taken off his Losartan 25 mg daily when his Invokana was started due to risk for hypotension. Patient reported increased home BPs with systolic in [id number] and diastolic 0000000. He called Dr. Ronnie Wallace office and was advised he can restart the Losartan as before which he has done. BP is 128/75 today.   Past Medical History:  Diagnosis Date  . Diabetes mellitus without complication ([place])   . Hyperlipidemia   . Hypertension   . Ischemic  colitis ([place])     Review of Systems:   Review of Systems  Respiratory: Negative for shortness of breath.   Cardiovascular: Negative for chest pain and leg swelling.  Psychiatric/Behavioral: Positive for depression. Negative for suicidal ideas. The patient is not nervous/anxious.      Physical Exam:  Vitals:   07/29/16 1554  BP: 128/75  Pulse: 74  Temp: 98.2 F (36.8 C)  TempSrc: Oral  SpO2: 96%  Weight: 289 lb (131.1 kg)   Physical Exam  Constitutional: He is oriented to person, place, and time. He appears well-developed and well-nourished. No distress.  HENT:  Head: Normocephalic and atraumatic.  Cardiovascular: Normal rate and regular rhythm.   No murmur heard. Pulmonary/Chest: Effort normal. No respiratory distress. He has no wheezes. He has no rales.  Neurological: He is alert and oriented to person, place, and time.  Skin: He is not diaphoretic.  Psychiatric: He has a normal mood and affect. His behavior is normal. Thought content normal.    Assessment & Plan:   See Encounters Tab for problem based charting.  Patient discussed with Dr. Beryle Wallace

## 2016-07-30 NOTE — Progress Notes (Signed)
Medicine attending: Medical history, presenting problems, physical findings, and medications, reviewed with resident physician Dr Vishal Patel on the day of the patient visit and I concur with his evaluation and management plan. 

## 2016-08-10 ENCOUNTER — Ambulatory Visit (INDEPENDENT_AMBULATORY_CARE_PROVIDER_SITE_OTHER): Payer: BLUE CROSS/BLUE SHIELD | Admitting: Endocrinology

## 2016-08-10 ENCOUNTER — Encounter: Payer: Self-pay | Admitting: Endocrinology

## 2016-08-10 VITALS — BP 128/70 | HR 86 | Ht 73.0 in | Wt 283.0 lb

## 2016-08-10 DIAGNOSIS — E1142 Type 2 diabetes mellitus with diabetic polyneuropathy: Secondary | ICD-10-CM

## 2016-08-10 MED ORDER — METFORMIN HCL ER 500 MG PO TB24: 2000.0000 mg | ORAL_TABLET | Freq: Every day | ORAL | 3 refills | Status: DC

## 2016-08-10 NOTE — Patient Instructions (Addendum)
Check blood sugars on waking up  3x per week  Also check blood sugars about 2 hours after a meal and do this after different meals by rotation  Recommended blood sugar levels on waking up is 90-130 and about 2 hours after meal is 130-160  Please bring your blood sugar monitor to each visit, thank you  Next Rx for Metformin will be ER

## 2016-08-10 NOTE — Progress Notes (Signed)
Patient ID: Jack Wallace, male   DOB: 1961-04-06, 55 y.o.   MRN: [id number]   Reason for Appointment : Followup of Type 2 Diabetes   History of Present Illness            Diagnosis: Type 2 diabetes mellitus, date of diagnosis: 1993         Past diabetes history: He has been treated with various drugs for his diabetes over the last several years. He has been taking metformin for at least 10 years and has been tolerating this. Over the years he has had additional medications to improve his control. He was also taking Actos previously and not clear if he had any side effects. This was stopped because of fear of long-term effects The last 5 years he was on glipizide also. Previously was taking 10 mg twice a day.   About 2 years ago his blood sugars were poorly controlled with readings up to 300 In early 2014 he started walking and lost 15 pounds. Subsequently he was getting low blood sugar during the day especially before lunch and sometimes in the afternoon His glipizide was  reduced but even with 5 mg twice a day he was getting hypoglycemia.  This was reduced and Invokana added. Invokana was stopped because of lack of clear benefit in 09/2013 Because of inadequate control he was started on Victoza on his visit in 10/14 in addition to his metformin and glipizide.  Recent history:   With Victoza and metformin his blood sugars have been previously fairly consistently controlled However because his A1c had started going up along with weight gain and mostly high fasting readings he was started on Invokana 100 mg daily in 7/17  Current problems, blood sugar patterns and management:  He initially apparently had nausea with taking the Invokana; he was told to take this after eating but he continues to take it before breakfast on waking up; however again his nausea is getting better now.  He thinks it is relieved by chewing gum  Not clear if he has improved his blood sugar control with  Invokana as yet especially since he has not done any fasting readings lately.  His fasting glucose was still high the first week of this month and his lab glucose was 179 on 8/14  Blood sugars in the middle of the day and evenings in the last 2 weeks are fairly good without any significant increase.  He has done some postprandial readings in the evenings which are excellent  He has started losing a little weight  He has started exercising about a week or so ago going to the gym in the afternoon after work  His A1c was done through his work and is still high at 7.3 on 8/14   He was apparently getting a prescription for 1000 mg metformin from his PCP and by mistake he was taking 4 tablets a day.  He is back on 2000 mg a day, still has prescription for 500 mg tablets  Oral hypoglycemic drugs: Metformin 1500 mg daily  Monitors blood glucose:  0.7 times a day        Glucometer:  One Touch Verio Blood Glucose readings from meter download:   OVERALL median 128, previously 145, recent range 95-155  Hypoglycemia : None    The diet that the patient has been following is: None recently   Meals: 2 meals per day.  usually eating a frozen meal at breakfast 9 am and supper 4pm  and only crackers at lunch.  Getting more snacks  sometimes after supper  Dietician visit: Most recent:?  Wt Readings from Last 3 Encounters:  08/10/16 283 lb (128.4 kg)  07/29/16 289 lb (131.1 kg)  06/29/16 287 lb (130.2 kg)      Physical activity: exercise: Recently going to the [facility] for work out up to 60 minutes         Lab Results  Component Value Date   HGBA1C 6.9 05/27/2016   HGBA1C 6.8 02/17/2016   HGBA1C 5.9 11/13/2015   Lab Results  Component Value Date   MICROALBUR <0.2 03/29/2015   LDLCALC 87 10/25/2015   CREATININE 1.0 10/25/2015       Medication List       Accurate as of 08/10/16  5:03 PM. Always use your most recent med list.          Alpha Lipoic Acid 200 MG Caps Take 200 mg by  mouth. 2 pills daily   aspirin 81 MG tablet Take 81 mg by mouth daily.   BIOTENE ORALBALANCE DRY MOUTH Gel Apply 1/2 inch length onto tongue and spread evenly repeat as often as needed Generic okay if available   canagliflozin 100 MG Tabs tablet Commonly known as:  INVOKANA 1 tablet before breakfast   dicyclomine 20 MG tablet Commonly known as:  BENTYL Take 1 tab twice daily   gabapentin 600 MG tablet Commonly known as:  NEURONTIN take 1 tablet by mouth three times a day   losartan 25 MG tablet Commonly known as:  COZAAR take 1 tablet by mouth daily   Magnesium Oxide 400 (240 Mg) MG Tabs Take 1 tablet by mouth 3 (three) times daily. Take 1 in the morning and 2 in the evening.   metFORMIN 500 MG 24 hr tablet Commonly known as:  GLUCOPHAGE-XR Take 4 tablets (2,000 mg total) by mouth daily with supper.   multivitamin with minerals Tabs tablet Take 1 tablet by mouth daily.   NOVOTWIST 32G X 5 MM Misc Generic drug:  Insulin Pen Needle use once daily   ONETOUCH DELICA LANCETS FINE Misc   ONETOUCH VERIO test strip Generic drug:  glucose blood TEST twice a day   pantoprazole 40 MG tablet Commonly known as:  PROTONIX take 1 tablet by mouth twice a day   pravastatin 40 MG tablet Commonly known as:  PRAVACHOL Take 40 mg by mouth every morning.   RESTORA Caps Take 1 capsule by mouth daily.   sertraline 100 MG tablet Commonly known as:  ZOLOFT Take 2 tablets (200 mg total) by mouth daily.   VICTOZA 18 MG/3ML Sopn Generic drug:  Liraglutide inject 1.2 milligram subcutaneously daily   vitamin B-12 500 MCG tablet Commonly known as:  CYANOCOBALAMIN Take 500 mcg by mouth daily.   VITAMIN D PO Take 1 tablet by mouth daily. D3 1000 IU   VITAMIN E PO Take 1 capsule by mouth daily. 400 IU daily       Allergies: No Known Allergies  Past Medical History:  Diagnosis Date  . Diabetes mellitus without complication ([place])   . Hyperlipidemia   . Hypertension   .  Ischemic colitis [facility])     Past Surgical History:  Procedure Laterality Date  . COLONOSCOPY  06/01/2014  . NASAL SEPTUM SURGERY      Family History  Problem Relation Age of Onset  . Hypertension Mother   . Diabetes Maternal Grandfather   . Colon cancer Neg Hx   . Throat cancer  Neg Hx   . Prostate cancer Neg Hx   . Pancreatic cancer Neg Hx   . Heart disease Neg Hx   . Kidney disease Neg Hx   . Liver disease Neg Hx     Social History:  reports that he has never smoked. He has never used smokeless tobacco. He reports that he does not drink alcohol or use drugs.    Review of Systems   He is only taking 25 mg losartan for mild hypertension Has been followed by PCP     Hyperlipidemia: taking pravastatin  40 mg for several years. .Lipids  show LDL 96.  Usually has regular monitoring with lab work at work  Lab Results  Component Value Date   CHOL 153 10/25/2015   HDL 38 10/25/2015   LDLCALC 87 10/25/2015   TRIG 142 10/25/2015     HYPOMAGNESEMIA: Followed by PCP  He has had symptoms of neuropathy in feet.   He mostly had burning, now has occasional tingling   He still takes gabapentin 600 mg tid with relief    Foot exam last: 8/17  Physical Examination:  BP 128/70   Pulse 86   Ht 6\' 1"  (1.854 m)   Wt 283 lb (128.4 kg)   SpO2 96%   BMI 37.34 kg/m   Diabetic Foot Exam - Simple   Simple Foot Form Diabetic Foot exam was performed with the following findings:  Yes 08/10/2016  4:47 PM  Visual Inspection No deformities, no ulcerations, no other skin breakdown bilaterally:  Yes See comments:  Yes Sensation Testing Intact to touch and monofilament testing bilaterally:  Yes Pulse Check Posterior Tibialis and Dorsalis pulse intact bilaterally:  Yes Comments Mild callosities on ball of feet      ASSESSMENT/PLAN:   Diabetes type 2:  See history of present illness for detailed discussion of current diabetes management, blood sugar patterns and problems  identified With Starting Invokana and not clear if his blood sugars are better yet Previously had highest readings fasting He has not checked these recently but his evening readings including postprandial are excellent especially with his starting exercise program Probably had some nausea but Invokana but this is improved  Advised him to start taking more blood sugars fasting and call next week to report these No change in Victoza dose; will also given him a prescription for extended release metformin which may help reduce his tendency to nausea  HYPERTENSION: Blood pressure is controlled with 25 mg losartan   Patient Instructions  Check blood sugars on waking up  3x per week  Also check blood sugars about 2 hours after a meal and do this after different meals by rotation  Recommended blood sugar levels on waking up is 90-130 and about 2 hours after meal is 130-160  Please bring your blood sugar monitor to each visit, thank you  Next Rx for Metformin will be ER       [facility] 08/10/2016, 5:03 PM

## 2016-08-23 ENCOUNTER — Other Ambulatory Visit: Payer: Self-pay | Admitting: Endocrinology

## 2016-09-19 ENCOUNTER — Other Ambulatory Visit: Payer: Self-pay | Admitting: Endocrinology

## 2016-09-21 ENCOUNTER — Other Ambulatory Visit: Payer: Self-pay | Admitting: *Deleted

## 2016-09-30 ENCOUNTER — Encounter: Payer: BLUE CROSS/BLUE SHIELD | Admitting: Internal Medicine

## 2016-10-05 ENCOUNTER — Other Ambulatory Visit: Payer: Self-pay | Admitting: Internal Medicine

## 2016-10-05 NOTE — Telephone Encounter (Signed)
Has appt 10/25 with pcp

## 2016-10-13 ENCOUNTER — Telehealth: Payer: Self-pay | Admitting: Internal Medicine

## 2016-10-13 NOTE — Telephone Encounter (Signed)
APT. REMINDER CALL, LMTCB °

## 2016-10-14 ENCOUNTER — Encounter: Payer: Self-pay | Admitting: Internal Medicine

## 2016-10-14 ENCOUNTER — Ambulatory Visit (INDEPENDENT_AMBULATORY_CARE_PROVIDER_SITE_OTHER): Payer: BLUE CROSS/BLUE SHIELD | Admitting: Internal Medicine

## 2016-10-14 VITALS — BP 142/79 | Temp 98.5°F | Ht 72.0 in | Wt 281.7 lb

## 2016-10-14 DIAGNOSIS — F329 Major depressive disorder, single episode, unspecified: Secondary | ICD-10-CM | POA: Diagnosis not present

## 2016-10-14 DIAGNOSIS — I10 Essential (primary) hypertension: Secondary | ICD-10-CM

## 2016-10-14 DIAGNOSIS — E1142 Type 2 diabetes mellitus with diabetic polyneuropathy: Secondary | ICD-10-CM | POA: Diagnosis not present

## 2016-10-14 DIAGNOSIS — Z7984 Long term (current) use of oral hypoglycemic drugs: Secondary | ICD-10-CM | POA: Diagnosis not present

## 2016-10-14 DIAGNOSIS — F321 Major depressive disorder, single episode, moderate: Secondary | ICD-10-CM

## 2016-10-14 DIAGNOSIS — Z0000 Encounter for general adult medical examination without abnormal findings: Secondary | ICD-10-CM

## 2016-10-14 DIAGNOSIS — Z79899 Other long term (current) drug therapy: Secondary | ICD-10-CM
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
## 2016-10-14 NOTE — Assessment & Plan Note (Signed)
Follows with Endocrinology. Takes Metformin, Invokana, and Victoza with last Hgb A1c of 7.3 two months ago. -F/u with endocrinology -Continue diet and exercise

## 2016-10-14 NOTE — Assessment & Plan Note (Signed)
Had flu shot at [facility] on 07/26/2016. Document scanned under media tab.

## 2016-10-14 NOTE — Patient Instructions (Addendum)
It was a pleasure to see you again Mr. Risko.  I think returning to counseling is an excellent idea. We will continue the Zoloft as is for now with the addition of counseling.  Your blood pressure is controlled fairly well. Please continue Losartan 25 mg daily.  Continue your other medications as prescribed and follow up with Dr. Dwyane Dee for your diabetes.  Please continue your exercise routine with the stationary bike and water aerobics.

## 2016-10-14 NOTE — Assessment & Plan Note (Signed)
Takes Losartan 25 mg daily. BP today is 142/79. He has started to exercise at the [facility].  BP Readings from Last 3 Encounters:  10/14/16 (!) 142/79  08/10/16 128/70  07/29/16 128/75   BP slightly increased from prior, but still well controlled. -Continue Losartan 25 mg daily -Continue exercise and diet
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
## 2016-10-15 NOTE — Progress Notes (Signed)
Medicine attending: Medical history, presenting problems, physical findings, and medications, reviewed with resident physician Dr Vishal Patel on the day of the patient visit and I concur with his evaluation and management plan. 

## 2016-10-19 ENCOUNTER — Other Ambulatory Visit: Payer: Self-pay | Admitting: Endocrinology

## 2016-10-19 ENCOUNTER — Telehealth: Payer: Self-pay | Admitting: Dietician

## 2016-10-19 NOTE — Telephone Encounter (Signed)
Calling to follow up wit Medical nutrition therapy for colitis symptoms, low magnesium, B12. Left  Message for return call.

## 2016-10-21 NOTE — Telephone Encounter (Signed)
Called and left second message to follow up on medical nutrition therapy and  for asked patient to call as needed.

## 2016-11-09 ENCOUNTER — Other Ambulatory Visit: Payer: Self-pay | Admitting: Internal Medicine

## 2016-11-09 DIAGNOSIS — F321 Major depressive disorder, single episode, moderate: Secondary | ICD-10-CM

## 2016-11-10 ENCOUNTER — Ambulatory Visit (INDEPENDENT_AMBULATORY_CARE_PROVIDER_SITE_OTHER): Payer: BLUE CROSS/BLUE SHIELD | Admitting: Endocrinology

## 2016-11-10 ENCOUNTER — Encounter: Payer: Self-pay | Admitting: Endocrinology

## 2016-11-10 VITALS — BP 108/78 | HR 75 | Temp 98.6°F | Resp 16 | Ht 72.0 in | Wt 276.2 lb

## 2016-11-10 DIAGNOSIS — E1165 Type 2 diabetes mellitus with hyperglycemia: Secondary | ICD-10-CM | POA: Diagnosis not present

## 2016-11-10 LAB — POCT GLYCOSYLATED HEMOGLOBIN (HGB A1C): Hemoglobin A1C: 7

## 2016-11-10 MED ORDER — GLIMEPIRIDE 1 MG PO TABS: 1.0000 mg | ORAL_TABLET | Freq: Every day | ORAL | 3 refills | Status: DC

## 2016-11-10 NOTE — Progress Notes (Signed)
Patient ID: Jack Wallace, male   DOB: 01/20/1961, 55 y.o.   MRN: [id number]   Reason for Appointment : Followup of Type 2 Diabetes   History of Present Illness            Diagnosis: Type 2 diabetes mellitus, date of diagnosis: 1993         Past diabetes history: He has been treated with various drugs for his diabetes over the last several years. He has been taking metformin for at least 10 years and has been tolerating this. Over the years he has had additional medications to improve his control. He was also taking Actos previously and not clear if he had any side effects. This was stopped because of fear of long-term effects The last 5 years he was on glipizide also. Previously was taking 10 mg twice a day.   About 2 years ago his blood sugars were poorly controlled with readings up to 300 In early 2014 he started walking and lost 15 pounds. Subsequently he was getting low blood sugar during the day especially before lunch and sometimes in the afternoon His glipizide was  reduced but even with 5 mg twice a day he was getting hypoglycemia.  This was reduced and Invokana added. Invokana was stopped because of lack of clear benefit in 09/2013 Because of inadequate control he was started on Victoza on his visit in 10/14 in addition to his metformin and glipizide.  Recent history:   Since he had weight gain and mostly high fasting readings he was started on Invokana 100 mg daily in 7/17 However his A1c has been not much better, was 7.3 in August and now 7%  Current problems, blood sugar patterns and management:  He had not done many readings in the mornings previously and now he appears to have mostly high readings in the morning  Higher blood sugars are progressively lower the rest of the day and did not seem to go up after supper also  He takes 2000 mg of metformin daily, taking 1000 mg twice a day of the extended release formulation  He has lost some weight since his  previous evaluation  Although he has tried to exercise he has been somewhat inconsistent with this, does try to go to the gym sometimes  His main complaint today is increasing nausea  Previously had tolerated Victoza and metformin without side effects  Oral hypoglycemic drugs: Metformin 2000 mg daily  Monitors blood glucose:  0.7 times a day        Glucometer:  One Touch Verio Blood Glucose readings from meter download:   Mean values apply above for all meters except median for One Touch  PRE-MEAL Fasting Lunch Dinner Bedtime Overall  Glucose range: 124-193   87-135     Mean/median: 162  125    125    POST-MEAL PC Breakfast PC Lunch PC Dinner  Glucose range:   84-133   Mean/median:       Hypoglycemia : None    The diet that the patient has been following is: None recently   Meals: 2 meals per day.  usually eating a frozen meal at breakfast 9 am and supper 4pm and only crackers at lunch.  Getting more snacks  sometimes after supper  Dietician visit: Most recent:?  Wt Readings from Last 3 Encounters:  11/10/16 276 lb 4 oz (125.3 kg)  10/14/16 281 lb 11.2 oz (127.8 kg)  08/10/16 283 lb (128.4 kg)  Physical activity: exercise: going to the [facility] periodically, uses bike         Lab Results  Component Value Date   HGBA1C 7.0 11/10/2016   HGBA1C 6.9 05/27/2016   HGBA1C 6.8 02/17/2016   Lab Results  Component Value Date   MICROALBUR <0.2 03/29/2015   LDLCALC 87 10/25/2015   CREATININE 1.0 10/25/2015    OTHER active problems: See review of systems    Medication List       Accurate as of 11/10/16 11:59 PM. Always use your most recent med list.          Alpha Lipoic Acid 200 MG Caps Take 200 mg by mouth. 2 pills daily   aspirin 81 MG tablet Take 81 mg by mouth daily.   BIOTENE ORALBALANCE DRY MOUTH Gel Apply 1/2 inch length onto tongue and spread evenly repeat as often as needed Generic okay if available   dicyclomine 20 MG tablet Commonly known as:   BENTYL Take 1 tab twice daily   gabapentin 600 MG tablet Commonly known as:  NEURONTIN take 1 tablet by mouth three times a day   glimepiride 1 MG tablet Commonly known as:  AMARYL Take 1 tablet (1 mg total) by mouth at bedtime.   INVOKANA 100 MG Tabs tablet Generic drug:  canagliflozin take 1 tablet by mouth BEFORE BREAKFAST   losartan 25 MG tablet Commonly known as:  COZAAR take 1 tablet by mouth once daily   Magnesium Oxide 400 (240 Mg) MG Tabs Take 1 tablet by mouth 3 (three) times daily. Take 1 in the morning and 2 in the evening.   metFORMIN 500 MG 24 hr tablet Commonly known as:  GLUCOPHAGE-XR Take 4 tablets (2,000 mg total) by mouth daily with supper.   multivitamin with minerals Tabs tablet Take 1 tablet by mouth daily.   NOVOTWIST 32G X 5 MM Misc Generic drug:  Insulin Pen Needle use daily   ONETOUCH DELICA LANCETS FINE Misc   ONETOUCH VERIO test strip Generic drug:  glucose blood TEST twice a day   pantoprazole 40 MG tablet Commonly known as:  PROTONIX take 1 tablet by mouth twice a day   pravastatin 40 MG tablet Commonly known as:  PRAVACHOL Take 40 mg by mouth every morning.   RESTORA Caps Take 1 capsule by mouth daily.   sertraline 100 MG tablet Commonly known as:  ZOLOFT take 2 tablets by mouth once daily   VICTOZA 18 MG/3ML Sopn Generic drug:  liraglutide inject 1.2 milligram subcutaneously daily   vitamin B-12 500 MCG tablet Commonly known as:  CYANOCOBALAMIN Take 500 mcg by mouth daily.   VITAMIN D PO Take 1 tablet by mouth daily. D3 1000 IU   VITAMIN E PO Take 1 capsule by mouth daily. 400 IU daily       Allergies: No Known Allergies  Past Medical History:  Diagnosis Date  . Diabetes mellitus without complication ([place])   . Hyperlipidemia   . Hypertension   . Ischemic colitis [facility])     Past Surgical History:  Procedure Laterality Date  . COLONOSCOPY  06/01/2014  . NASAL SEPTUM SURGERY      Family History    Problem Relation Age of Onset  . Hypertension Mother   . Diabetes Maternal Grandfather   . Colon cancer Neg Hx   . Throat cancer Neg Hx   . Prostate cancer Neg Hx   . Pancreatic cancer Neg Hx   . Heart disease Neg Hx   .  Kidney disease Neg Hx   . Liver disease Neg Hx     Social History:  reports that he has never smoked. He has never used smokeless tobacco. He reports that he does not drink alcohol or use drugs.    Review of Systems   ?  HYPERTENSION: Taking 25 mg losartan, also taking Invokana     Hyperlipidemia: taking pravastatin  40 mg for several years. .Lipids  show LDL 96.  Usually has regular monitoring with lab work at work  Lab Results  Component Value Date   CHOL 153 10/25/2015   HDL 38 10/25/2015   LDLCALC 87 10/25/2015   TRIG 142 10/25/2015     HYPOMAGNESEMIA: Followed by PCP  He has had symptoms of neuropathy in feet.   He mostly had burning, Also has occasional tingling   He still takes gabapentin 600 mg tid with relief    Foot exam last: 8/17  Physical Examination:  BP 108/78 (BP Location: Left Arm, Patient Position: Sitting, Cuff Size: Large)   Pulse 75   Temp 98.6 F (37 C) (Oral)   Resp 16   Ht 6' (1.829 m)   Wt 276 lb 4 oz (125.3 kg)   SpO2 98%   BMI 37.47 kg/m      ASSESSMENT/PLAN:   Diabetes type 2:  See history of present illness for detailed discussion of current diabetes management, blood sugar patterns and problems identified  His A1c is fair but his fasting blood sugars are significantly high most of the time He may have lost some weight with Invokana but more likely his recent weight loss is related to his nausea Fasting blood sugars are not improved with Invokana  Since she has had increasing nausea will temporarily reduce his Victoza and metformin to half doses With metformin he can stop the morning doses and continue suppertime doses  Also will start Amaryl 1 mg, half tablet at bedtime to help fasting hyperglycemia and  may need to increase to one tablet later if needed He will call if he has any issues with this  If his nausea persists he will need to discuss with PCP  HYPERTENSION: Blood pressure is controlled with 25 mg losartan  He will bring copies of labs done through his work on the next visit  Patient Instructions  Glimeperide 1/2 pill at bedtime  Check blood sugars on waking up 3x weekly  Also check blood sugars about 2 hours after a meal and do this after different meals by rotation  Recommended blood sugar levels on waking up is 90-130 and about 2 hours after meal is 130-160  Please bring your blood sugar monitor to each visit, thank you  Victoza 0.6mg  daily  Metformin 2 pills at dinner      Khandi Kernes 11/11/2016, 12:29 PM

## 2016-11-10 NOTE — Patient Instructions (Addendum)
Glimeperide 1/2 pill at bedtime  Check blood sugars on waking up 3x weekly  Also check blood sugars about 2 hours after a meal and do this after different meals by rotation  Recommended blood sugar levels on waking up is 90-130 and about 2 hours after meal is 130-160  Please bring your blood sugar monitor to each visit, thank you  Victoza 0.6mg  daily  Metformin 2 pills at dinner

## 2016-11-19 ENCOUNTER — Encounter: Payer: BLUE CROSS/BLUE SHIELD | Admitting: Internal Medicine

## 2016-12-02 ENCOUNTER — Other Ambulatory Visit: Payer: BLUE CROSS/BLUE SHIELD

## 2016-12-02 ENCOUNTER — Other Ambulatory Visit (INDEPENDENT_AMBULATORY_CARE_PROVIDER_SITE_OTHER): Payer: BLUE CROSS/BLUE SHIELD

## 2016-12-02 DIAGNOSIS — E1165 Type 2 diabetes mellitus with hyperglycemia: Secondary | ICD-10-CM

## 2016-12-02 LAB — BASIC METABOLIC PANEL
BUN: 21 mg/dL (ref 6–23)
CHLORIDE: 104 meq/L (ref 96–112)
CO2: 30 meq/L (ref 19–32)
CREATININE: 0.91 mg/dL (ref 0.40–1.50)
Calcium: 10 mg/dL (ref 8.4–10.5)
GFR: 91.53 mL/min (ref >60.00)
GLUCOSE: 101 mg/dL — AB (ref 70–99)
POTASSIUM: 3.9 meq/L (ref 3.5–5.1)
Sodium: 142 mEq/L (ref 135–145)

## 2016-12-03 LAB — FRUCTOSAMINE: FRUCTOSAMINE: 281 umol/L (ref 0–285)

## 2016-12-08 ENCOUNTER — Ambulatory Visit (INDEPENDENT_AMBULATORY_CARE_PROVIDER_SITE_OTHER): Payer: BLUE CROSS/BLUE SHIELD | Admitting: Internal Medicine

## 2016-12-08 ENCOUNTER — Encounter: Payer: Self-pay | Admitting: Internal Medicine

## 2016-12-08 DIAGNOSIS — J3489 Other specified disorders of nose and nasal sinuses: Secondary | ICD-10-CM

## 2016-12-08 DIAGNOSIS — J069 Acute upper respiratory infection, unspecified: Secondary | ICD-10-CM

## 2016-12-08 DIAGNOSIS — J029 Acute pharyngitis, unspecified: Secondary | ICD-10-CM

## 2016-12-08 MED ORDER — IPRATROPIUM BROMIDE 0.03 % NA SOLN: 2.0000 | Freq: Three times a day (TID) | NASAL | 2 refills | Status: DC

## 2016-12-08 MED ORDER — BENZONATATE 100 MG PO CAPS: 100.0000 mg | ORAL_CAPSULE | Freq: Three times a day (TID) | ORAL | 1 refills | Status: DC | PRN

## 2016-12-08 NOTE — Progress Notes (Signed)
   CC: congestion  HPI:  [name] is a 55 y.o. with past medical history as below who presents to clinic for congestion that started Friday. Congestion has gotten worse and he has not tried anything for it. Denies fevers, night sweats, chills, facial pressure, ear pain, and cough. He is having rhinorrhea and mild shortness of breath. Denies smoking. He got his flu shot this year.  Past Medical History:  Diagnosis Date  . Diabetes mellitus without complication ([place])   . Hyperlipidemia   . Hypertension   . Ischemic colitis ([place])     Review of Systems:  Her review of systems noted in history of present illness. Review of system otherwise negative.  Physical Exam:  Vitals:   12/08/16 1450  BP: 117/84  Pulse: 94  Temp: 97.5 F (36.4 C)  TempSrc: Oral  SpO2: 99%  Weight: 284 lb (128.8 kg)   Physical Exam  Constitutional:appears well-developed and well-nourished. No distress.  HENT:  Head: Normocephalic and atraumatic. Negative for lymphadenopathy. No tenderness to palpation of sinuses. Nose: Nose normal.  Throat: Erythematous. Positive for postnasal drip. Negative for tonsillar exudates. Cardiovascular: Normal rate, regular rhythm and normal heart sounds.  Exam reveals no gallop and no friction rub.   No murmur heard. Pulmonary/Chest: Effort normal and breath sounds normal. No respiratory distress.  has no wheezes.no rales.  Neurological: alert and oriented to person, place, and time.  Skin: Skin is warm and dry. No rash noted.  not diaphoretic. No erythema. No pallor.   Assessment & Plan:   See Encounters Tab for problem based charting.  Patient discussed with Dr. Dareen Piano

## 2016-12-08 NOTE — Assessment & Plan Note (Signed)
Assessment: Patient presenting with rhinorrhea and sore throat for the past 5 days. He has not tried anything to alleviate his symptoms. He denies fevers, night sweats, chills. Symptoms are consistent with a viral URI. Unable to use throat lozenges because it gives him acid reflux.  Plan: Rx for Tessalon Perles and Atrovent nasal spray. Advised to return to clinic if symptoms do not improve over the next 1-2 weeks. Advised to do salt water gargles.

## 2016-12-08 NOTE — Patient Instructions (Signed)
Start using atrovent nasal spray to help with your runny nose.   You can take tessalon pearls for your sore throat.   Try gurgling with salt water to help with sore throat as well.

## 2016-12-09 ENCOUNTER — Ambulatory Visit: Payer: BLUE CROSS/BLUE SHIELD | Admitting: Endocrinology

## 2016-12-09 NOTE — Progress Notes (Signed)
Internal Medicine Clinic Attending  Case discussed with Dr. Truong at the time of the visit.  We reviewed the resident's history and exam and pertinent patient test results.  I agree with the assessment, diagnosis, and plan of care documented in the resident's note.  

## 2016-12-11 ENCOUNTER — Other Ambulatory Visit: Payer: Self-pay | Admitting: Internal Medicine

## 2016-12-11 DIAGNOSIS — J029 Acute pharyngitis, unspecified: Secondary | ICD-10-CM | POA: Insufficient documentation

## 2016-12-11 MED ORDER — LIDOCAINE VISCOUS 2 % MT SOLN: 20.0000 mL | OROMUCOSAL | 0 refills | Status: DC | PRN

## 2016-12-15 ENCOUNTER — Telehealth: Payer: Self-pay | Admitting: Endocrinology

## 2016-12-15 MED ORDER — GLUCOSE BLOOD VI STRP: ORAL_STRIP | 2 refills | Status: DC

## 2016-12-15 MED ORDER — BAYER MICROLET LANCETS MISC: 2 refills | Status: AC

## 2016-12-15 MED ORDER — BAYER CONTOUR NEXT EZ W/DEVICE KIT: PACK | 2 refills | Status: AC

## 2016-12-15 NOTE — Telephone Encounter (Signed)
Pt insurance will now only cover the bayer contour next meter and supplies please call in new set to [facility]

## 2016-12-15 NOTE — Telephone Encounter (Signed)
Rx submitted for [employer] meter, test strips and lancets.

## 2016-12-22 ENCOUNTER — Other Ambulatory Visit: Payer: Self-pay | Admitting: Endocrinology

## 2016-12-23 ENCOUNTER — Encounter: Payer: Self-pay | Admitting: Endocrinology

## 2016-12-23 ENCOUNTER — Ambulatory Visit (INDEPENDENT_AMBULATORY_CARE_PROVIDER_SITE_OTHER): Payer: BLUE CROSS/BLUE SHIELD | Admitting: Endocrinology

## 2016-12-23 VITALS — BP 124/86 | HR 86 | Ht 72.84 in | Wt 286.2 lb

## 2016-12-23 DIAGNOSIS — E1165 Type 2 diabetes mellitus with hyperglycemia: Secondary | ICD-10-CM

## 2016-12-23 NOTE — Patient Instructions (Addendum)
Check blood sugars on waking up 2-3x per week  Also check blood sugars about 2 hours after a meal and do this after different meals by rotation  Recommended blood sugar levels on waking up is 90-130 and about 2 hours after meal is 130-160  Please bring your blood sugar monitor to each visit, thank you  Try 3 Metformin at dinner daily

## 2016-12-23 NOTE — Progress Notes (Signed)
Patient ID: Jack Wallace, male   DOB: [birth year], 56 y.o.   MRN: [id number]   Reason for Appointment : Followup of Type 2 Diabetes   History of Present Illness            Diagnosis: Type 2 diabetes mellitus, date of diagnosis: 1993         Past diabetes history: He has been treated with various drugs for his diabetes over the last several years. He has been taking metformin for at least 10 years and has been tolerating this. Over the years he has had additional medications to improve his control. He was also taking Actos previously and not clear if he had any side effects. This was stopped because of fear of long-term effects The last 5 years he was on glipizide also. Previously was taking 10 mg twice a day.   About 2 years ago his blood sugars were poorly controlled with readings up to 300 In early 2014 he started walking and lost 15 pounds. Subsequently he was getting low blood sugar during the day especially before lunch and sometimes in the afternoon His glipizide was  reduced but even with 5 mg twice a day he was getting hypoglycemia.  This was reduced and Invokana added. Invokana was stopped because of lack of clear benefit in 09/2013 Because of inadequate control he was started on Victoza on his visit in 10/14 in addition to his metformin and glipizide.  Recent history:   Since he had weight gain and mostly high fasting readings he was started on Invokana 100 mg daily in 7/17 Last A1c was 7%  Current problems, blood sugar patterns and management:  He was again noticed to have high fasting readings on his last visit, median reading of 162  Because of significant nausea on his last visit he was told to cut back on his Victoza and metformin to half doses  Also given 0.5 mg Amaryl to take at bedtime  Overall his blood sugars are improving  Fructosamine indicates their control with the level of 281  He had a couple of high readings in early December in the morning  but he thinks these were when he had a respiratory infection  FASTING readings more recently have been mostly around 130-140  Has not checked many readings after meals especially evening but they are generally not high  He does try to walk but limited by cold weather, may sometimes go for exercise  He had lost weight without nausea previously but is back to baseline, also probably not eating consistently and more carbohydrate at times  Oral hypoglycemic drugs: Metformin Er 1000 mg daily  Monitors blood glucose:  0.7 times a day        Glucometer:  One Touch Verio Blood Glucose readings from meter download:   Mean values apply above for all meters except median for One Touch  PRE-MEAL Fasting Lunch Dinner Bedtime Overall  Glucose range: 109-193   95-155     Mean/median: 142     129    POST-MEAL PC Breakfast PC Lunch PC Dinner  Glucose range:   89-101   Mean/median:        Hypoglycemia : None    The diet that the patient has been following is: None recently   Meals: 2 meals per day.  usually eating a frozen meal at breakfast 9 am and supper 4pm and only crackers at lunch.  Getting more snacks  sometimes after supper  Dietician visit:  Most recent:?  Wt Readings from Last 3 Encounters:  12/23/16 286 lb 3.2 oz (129.8 kg)  12/08/16 284 lb (128.8 kg)  11/10/16 276 lb 4 oz (125.3 kg)      Physical activity: exercise: going to the [facility] periodically, uses bike         Lab Results  Component Value Date   HGBA1C 7.0 11/10/2016   HGBA1C 6.9 05/27/2016   HGBA1C 6.8 02/17/2016   Lab Results  Component Value Date   MICROALBUR <0.2 03/29/2015   LDLCALC 87 10/25/2015   CREATININE 0.91 12/02/2016    OTHER active problems: See review of systems  Allergies as of 12/23/2016   No Known Allergies     Medication List       Accurate as of 12/23/16  4:19 PM. Always use your most recent med list.          Alpha Lipoic Acid 200 MG Caps Take 200 mg by mouth. 2 pills daily     aspirin 81 MG tablet Take 81 mg by mouth daily.   BAYER MICROLET LANCETS lancets Use to check blood sugar two times per day   BIOTENE ORALBALANCE DRY MOUTH Gel Apply 1/2 inch length onto tongue and spread evenly repeat as often as needed Generic okay if available   CONTOUR NEXT EZ MONITOR w/Device Kit Use to check blood sugar two times per day   gabapentin 600 MG tablet Commonly known as:  NEURONTIN take 1 tablet by mouth three times a day   glimepiride 1 MG tablet Commonly known as:  AMARYL Take 1 tablet (1 mg total) by mouth at bedtime.   glucose blood test strip Commonly known as:  BAYER CONTOUR NEXT TEST Use to check blood sugar two times per day   INVOKANA 100 MG Tabs tablet Generic drug:  canagliflozin take 1 tablet by mouth BEFORE BREAKFAST   losartan 25 MG tablet Commonly known as:  COZAAR take 1 tablet by mouth once daily   Magnesium Oxide 400 (240 Mg) MG Tabs Take 1 tablet by mouth 3 (three) times daily. Take 1 in the morning and 2 in the evening.   metFORMIN 500 MG 24 hr tablet Commonly known as:  GLUCOPHAGE-XR take 4 tablets by mouth once daily WITH SUPPER   multivitamin with minerals Tabs tablet Take 1 tablet by mouth daily.   NOVOTWIST 32G X 5 MM Misc Generic drug:  Insulin Pen Needle use daily   pantoprazole 40 MG tablet Commonly known as:  PROTONIX take 1 tablet by mouth twice a day   pravastatin 40 MG tablet Commonly known as:  PRAVACHOL Take 40 mg by mouth every morning.   RESTORA Caps Take 1 capsule by mouth daily.   sertraline 100 MG tablet Commonly known as:  ZOLOFT take 2 tablets by mouth once daily   VICTOZA 18 MG/3ML Sopn Generic drug:  liraglutide inject 1.2 milligram subcutaneously daily   vitamin B-12 500 MCG tablet Commonly known as:  CYANOCOBALAMIN Take 500 mcg by mouth daily.   VITAMIN D PO Take 1 tablet by mouth daily. D3 1000 IU   VITAMIN E PO Take 1 capsule by mouth daily. 400 IU daily       Allergies: No  Known Allergies  Past Medical History:  Diagnosis Date  . Diabetes mellitus without complication ([place])   . Hyperlipidemia   . Hypertension   . Ischemic colitis [facility])     Past Surgical History:  Procedure Laterality Date  . COLONOSCOPY  06/01/2014  .  NASAL SEPTUM SURGERY      Family History  Problem Relation Age of Onset  . Hypertension Mother   . Diabetes Maternal Grandfather   . Colon cancer Neg Hx   . Throat cancer Neg Hx   . Prostate cancer Neg Hx   . Pancreatic cancer Neg Hx   . Heart disease Neg Hx   . Kidney disease Neg Hx   . Liver disease Neg Hx     Social History:  reports that he has never smoked. He has never used smokeless tobacco. He reports that he does not drink alcohol or use drugs.    Review of Systems   He continues to have some nausea in the mornings on waking up, probably somewhat better with reducing Victoza and metformin    HYPERTENSION: Taking 25 mg losartan, also taking Invokana     Hyperlipidemia: taking pravastatin  40 mg for several years. .Lipids  show last LDL 96.     Lab Results  Component Value Date   CHOL 153 10/25/2015   HDL 38 10/25/2015   LDLCALC 87 10/25/2015   TRIG 142 10/25/2015     HYPOMAGNESEMIA: Followed by PCP  He has had symptoms of neuropathy in feet.   He mostly had burning, Also has occasional tingling   He  takes gabapentin 600 mg tid with relief    Foot exam last: 8/17  Physical Examination:  BP 124/86   Pulse 86   Ht 6' 0.83" (1.85 m)   Wt 286 lb 3.2 oz (129.8 kg)   SpO2 96%   BMI 37.93 kg/m      ASSESSMENT/PLAN:   Diabetes type 2:  See history of present illness for detailed discussion of current diabetes management, blood sugar patterns and problems identified  His Blood sugars are improving and fructosamine is upper normal He tends to have presently high fasting readings and much better readings later in the day With taking 0.5 mg Amaryl  his fasting readings are better Since he does not  have hyper spending readings may benefit from trying to increase metformin again, he will try to do 3 tablets at suppertime instead of 2 for now Follow-up with A1c in 3 months  If his nausea persists he will need to discuss with PCP    Patient Instructions  Check blood sugars on waking up 2-3x per week  Also check blood sugars about 2 hours after a meal and do this after different meals by rotation  Recommended blood sugar levels on waking up is 90-130 and about 2 hours after meal is 130-160  Please bring your blood sugar monitor to each visit, thank you  Try 3 Metformin at dinner daily       Shaolin Armas 12/23/2016, 4:19 PM

## 2017-02-10 ENCOUNTER — Encounter: Payer: Self-pay | Admitting: Internal Medicine

## 2017-02-10 ENCOUNTER — Ambulatory Visit (INDEPENDENT_AMBULATORY_CARE_PROVIDER_SITE_OTHER): Payer: BLUE CROSS/BLUE SHIELD | Admitting: Internal Medicine

## 2017-02-10 VITALS — BP 134/76 | HR 73 | Temp 98.7°F | Ht 72.0 in | Wt 293.2 lb

## 2017-02-10 DIAGNOSIS — Z79899 Other long term (current) drug therapy: Secondary | ICD-10-CM

## 2017-02-10 DIAGNOSIS — R2232 Localized swelling, mass and lump, left upper limb: Secondary | ICD-10-CM

## 2017-02-10 DIAGNOSIS — E1142 Type 2 diabetes mellitus with diabetic polyneuropathy: Secondary | ICD-10-CM

## 2017-02-10 DIAGNOSIS — F329 Major depressive disorder, single episode, unspecified: Secondary | ICD-10-CM | POA: Diagnosis not present

## 2017-02-10 DIAGNOSIS — I10 Essential (primary) hypertension: Secondary | ICD-10-CM | POA: Diagnosis not present

## 2017-02-10 DIAGNOSIS — Z7984 Long term (current) use of oral hypoglycemic drugs: Secondary | ICD-10-CM | POA: Diagnosis not present

## 2017-02-10 DIAGNOSIS — D1779 Benign lipomatous neoplasm of other sites: Secondary | ICD-10-CM

## 2017-02-10 DIAGNOSIS — F321 Major depressive disorder, single episode, moderate: Secondary | ICD-10-CM

## 2017-02-10 DIAGNOSIS — E119 Type 2 diabetes mellitus without complications: Secondary | ICD-10-CM

## 2017-02-10 DIAGNOSIS — D1720 Benign lipomatous neoplasm of skin and subcutaneous tissue of unspecified limb: Secondary | ICD-10-CM | POA: Insufficient documentation

## 2017-02-10 LAB — POCT GLYCOSYLATED HEMOGLOBIN (HGB A1C): Hemoglobin A1C: 6.3

## 2017-02-10 LAB — GLUCOSE, CAPILLARY: Glucose-Capillary: 91 mg/dL (ref 65–99)

## 2017-02-10 MED ORDER — SERTRALINE HCL 100 MG PO TABS: 200.0000 mg | ORAL_TABLET | Freq: Every day | ORAL | 3 refills | Status: DC

## 2017-02-10 NOTE — Patient Instructions (Signed)
It was a pleasure to see you again Jack Wallace.  I am glad you are feeling better. Keep up the good work.  Your Hgb A1c is 6.3 today. Please follow up with Dr. Dwyane Dee as scheduled.  The bump on your hand is likely a lipoma (fatty tissue). Please monitor for any changes to size, shape, or pain.  I have printed a work note for you today.  Please follow up with me in 3 months or sooner if needed.   Lipoma Introduction A lipoma is a noncancerous (benign) tumor that is made up of fat cells. This is a very common type of soft-tissue growth. Lipomas are usually found under the skin (subcutaneous). They may occur in any tissue of the body that contains fat. Common areas for lipomas to appear include the back, shoulders, buttocks, and thighs. Lipomas grow slowly, and they are usually painless. Most lipomas do not cause problems and do not require treatment. What are the causes? The cause of this condition is not known. What increases the risk? This condition is more likely to develop in:  People who are 64-71 years old.  People who have a family history of lipomas. What are the signs or symptoms? A lipoma usually appears as a small, round bump under the skin. It may feel soft or rubbery, but the firmness can vary. Most lipomas are not painful. However, a lipoma may become painful if it is located in an area where it pushes on nerves. How is this diagnosed? A lipoma can usually be diagnosed with a physical exam. You may also have tests to confirm the diagnosis and to rule out other conditions. Tests may include:  Imaging tests, such as a CT scan or MRI.  Removal of a tissue sample to be looked at under a microscope (biopsy). How is this treated? Treatment is not needed for small lipomas that are not causing problems. If a lipoma continues to get bigger or it causes problems, removal is often the best option. Lipomas can also be removed to improve appearance. Removal of a lipoma is usually done  with a surgery in which the fatty cells and the surrounding capsule are removed. Most often, a medicine that numbs the area (local anesthetic) is used for this procedure. Follow these instructions at home:  Keep all follow-up visits as directed by your health care provider. This is important. Contact a health care provider if:  Your lipoma becomes larger or hard.  Your lipoma becomes painful, red, or increasingly swollen. These could be signs of infection or a more serious condition. This information is not intended to replace advice given to you by your health care provider. Make sure you discuss any questions you have with your health care provider. Document Released: 11/27/2002 Document Revised: 05/14/2016 Document Reviewed: 12/03/2014  2017 Elsevier

## 2017-02-10 NOTE — Progress Notes (Signed)
   CC: Depression  HPI:  Jack Wallace is a 56 y.o. male with PMH of T2DM, HTN, Depression who presents for follow up management of his HTN and Depression as well as for evaluation of a bump on his left wrist.  Depression: Patient currently takes Sertraline 200 mg daily. He feels his mood is improving lately after he requested and was approved for a new position at work. He also recently distributed his mother's ashes in the ocean at [facility], which provided a therapeutic effect. He has opened up to a coworker and has now found a therapist that he feels comfortable in discussing his emotions and life experiences. This has also been beneficial. He is more optimistic and has started exercising at the [employer] again. He spends time in the [place] which helps with relaxation. He denies any current suicidal or homicidal ideations.  HTN: He currently takes Losartan 25 mg daily. His BP this visit is 134/76.  T2DM: Patient follows with Endocrinology, Dr. Dwyane Dee. He currently takes Metformin XR 1500 mg daily, Invokana 100 mg daily, Victoza 0.6 mg daily, and Glimepiride 1 mg qhs. He denies any hyperglycemic symptoms. He has occasional hypoglycemic symptoms only when he is exercising. His last Hgb A1c was 7.0 on 11/10/2016.   Lipoma: Patient noticed a small round non-tender freely mobile bump on the lateral side of his left wrist about 1 month ago. He has not seen any change in size, shape, or color. He has not noticed any erythema, skin break, or discharge. He denies any injury or trauma to the area.   Past Medical History:  Diagnosis Date  . Diabetes mellitus without complication ([place])   . Hyperlipidemia   . Hypertension   . Ischemic colitis ([place])     Review of Systems:   Review of Systems  Constitutional: Negative for chills and fever.  Respiratory: Negative for shortness of breath.   Cardiovascular: Negative for chest pain.  Genitourinary: Negative for dysuria and frequency.    Skin:       Nontender round bump on left wrist  Psychiatric/Behavioral: Positive for depression.       Improved mood     Physical Exam:  Vitals:   02/10/17 1438  BP: 134/76  Pulse: 73  Temp: 98.7 F (37.1 C)  TempSrc: Oral  SpO2: 97%  Weight: 293 lb 3.2 oz (133 kg)  Height: 6' (1.829 m)   Physical Exam  Constitutional: He is oriented to person, place, and time. He appears well-developed and well-nourished. No distress.  Cardiovascular: Normal rate and regular rhythm.   No murmur heard. Pulmonary/Chest: Effort normal. No respiratory distress. He has no wheezes. He has no rales.  Neurological: He is alert and oriented to person, place, and time.  Skin:  ~0.75 cm round, freely mobile, non-tender, subcutaneous nodule on lateral aspect of left wrist. No erythema, discharge, or skin break.  Psychiatric: He has a normal mood and affect.    Assessment & Plan:   See Encounters Tab for problem based charting.  Patient discussed with Dr. Lynnae January

## 2017-02-12 NOTE — Assessment & Plan Note (Signed)
Patient noticed a small round non-tender freely mobile bump on the lateral side of his left wrist about 1 month ago. He has not seen any change in size, shape, or color. He has not noticed any erythema, skin break, or discharge. He denies any injury or trauma to the area.  Exam is most consistent with a lipoma. Patient will continue to monitor for any change in appearance. He is not having any pain, functional deficit, or complaining of aesthetic appearance. If any of the above occur, we can consider resection.

## 2017-02-12 NOTE — Assessment & Plan Note (Signed)
Patient follows with Endocrinology, Dr. Dwyane Dee. He currently takes Metformin XR 1500 mg daily, Invokana 100 mg daily, Victoza 0.6 mg daily, and Glimepiride 1 mg qhs. He denies any hyperglycemic symptoms. He has occasional hypoglycemic symptoms only when he is exercising. His last Hgb A1c was 7.0 on 11/10/2016.  Repeat Hgb A1c is 6.3. His diabetes is currently well controlled. He will continue his current medications and follow up with Endocrinology. I did advise him to adjust his diet prior to exercise and keep a snack with him to reduce hypoglycemic events.

## 2017-02-12 NOTE — Assessment & Plan Note (Signed)
BP Readings from Last 3 Encounters:  02/10/17 134/76  12/23/16 124/86  12/08/16 117/84   He currently takes Losartan 25 mg daily. His BP this visit is 134/76.  BP is controlled. Will continue Losartan 25 mg daily. Have room to increase if needed.

## 2017-02-12 NOTE — Assessment & Plan Note (Signed)
Patient currently takes Sertraline 200 mg daily. He feels his mood is improving lately after he requested and was approved for a new position at work. He also recently distributed his mother's ashes in the ocean at [facility], which provided a therapeutic effect. He has opened up to a coworker and has now found a therapist that he feels comfortable in discussing his emotions and life experiences. This has also been beneficial. He is more optimistic and has started exercising at the [facility] again. He spends time in the [place] which helps with relaxation. He denies any current suicidal or homicidal ideations.  Patient's mood is improving. His PHQ-9 is 15 compared to 17 on last visit. Will continue current management with Sertraline, therapy, and lifestyle changes including exercise. He is in agreement with the plan. - Continue Sertraline 200 mg daily - Continue therapy sessions - Continue with exercise plans, relaxation techniques

## 2017-02-15 NOTE — Progress Notes (Signed)
Internal Medicine Clinic Attending  Case discussed with Dr. Patel,Rushil at the time of the visit.  We reviewed the resident's history and exam and pertinent patient test results.  I agree with the assessment, diagnosis, and plan of care documented in the resident's note.  

## 2017-02-16 ENCOUNTER — Other Ambulatory Visit: Payer: Self-pay | Admitting: Endocrinology

## 2017-02-22 ENCOUNTER — Other Ambulatory Visit: Payer: Self-pay | Admitting: Endocrinology

## 2017-03-17 LAB — HEMOGLOBIN A1C: Hemoglobin A1C: 6.6

## 2017-03-17 LAB — BASIC METABOLIC PANEL: Creatinine: 0.9 mg/dL (ref 0.6–1.3)

## 2017-03-17 LAB — LIPID PANEL: LDL Cholesterol: 90 mg/dL

## 2017-03-24 ENCOUNTER — Encounter: Payer: Self-pay | Admitting: Endocrinology

## 2017-03-24 ENCOUNTER — Ambulatory Visit (INDEPENDENT_AMBULATORY_CARE_PROVIDER_SITE_OTHER): Payer: BLUE CROSS/BLUE SHIELD | Admitting: Endocrinology

## 2017-03-24 VITALS — BP 128/82 | HR 71 | Ht 72.0 in | Wt 294.0 lb

## 2017-03-24 DIAGNOSIS — E1165 Type 2 diabetes mellitus with hyperglycemia: Secondary | ICD-10-CM | POA: Diagnosis not present

## 2017-03-24 MED ORDER — CANAGLIFLOZIN 300 MG PO TABS: 300.0000 mg | ORAL_TABLET | Freq: Every day | ORAL | 3 refills | Status: DC

## 2017-03-24 NOTE — Patient Instructions (Signed)
Invokana 2 in am, then next is 300mg 

## 2017-03-24 NOTE — Progress Notes (Signed)
Patient ID: Jack Wallace, male   DOB: [birth year], 56 y.o.   MRN: [id number]   Reason for Appointment : Followup of Type 2 Diabetes   History of Present Illness            Diagnosis: Type 2 diabetes mellitus, date of diagnosis: 1993         Past diabetes history: He has been treated with various drugs for his diabetes over the last several years. He has been taking metformin for at least 10 years and has been tolerating this. Over the years he has had additional medications to improve his control. He was also taking Actos previously and not clear if he had any side effects. This was stopped because of fear of long-term effects The last 5 years he was on glipizide also. Previously was taking 10 mg twice a day.   About 2 years ago his blood sugars were poorly controlled with readings up to 300 In early 2014 he started walking and lost 15 pounds. Subsequently he was getting low blood sugar during the day especially before lunch and sometimes in the afternoon His glipizide was  reduced but even with 5 mg twice a day he was getting hypoglycemia.  This was reduced and Invokana added. Invokana was stopped because of lack of clear benefit in 09/2013 Because of inadequate control he was started on Victoza on his visit in 10/14 in addition to his metformin and glipizide.  Recent history:   His A1c is recently 6.6 done from outside lab last known  Since he had weight gain and mostly high fasting readings he was started on Invokana 100 mg daily in 7/17  Current problems, blood sugar patterns and management:  His Victoza had been reduced previously because of persistent nausea and he is still taking 0.6 mg  He now appears to be gaining weight and has gained about 8 pounds  Also he has not been exercising at all since his last visit  Although he previously had relatively high fasting readings and was given Amaryl to control this he has not checked his readings in the morning  He  checks his blood sugars mostly before and after his evening meal and sometimes late morning and most of these are fairly good  No hypoglycemia with Amaryl  He is now tolerating 3 tablets of metformin ER  Non-insulin hypoglycemic drugs: Metformin Er 1500 mg daily  Victoza 0.6 mg daily   Monitors blood glucose:  0.7 times a day        Glucometer:  Contour  Blood Glucose readings from meter download:   Mean values apply above for all meters except median for One Touch  PRE-MEAL Fasting Lunch Dinner Evening  Overall  Glucose range:  10 7-1 45   103-149    Mean/median:  127   122  122    Hypoglycemia : None    The diet that the patient has been following is: None recently   Meals: 2 meals per day.  usually eating a frozen meal at breakfast 9 am and supper 4pm and only crackers at lunch.  Getting more snacks  sometimes after supper  Dietician visit: Most recent:?  Wt Readings from Last 3 Encounters:  03/24/17 294 lb (133.4 kg)  02/10/17 293 lb 3.2 oz (133 kg)  12/23/16 286 lb 3.2 oz (129.8 kg)      Physical activity: exercise: not going to the [facility]         Lab Results  Component Value Date   HGBA1C 6.6 03/17/2017   HGBA1C 6.3 02/10/2017   HGBA1C 7.0 11/10/2016   Lab Results  Component Value Date   MICROALBUR <0.2 03/29/2015   LDLCALC 90 03/17/2017   CREATININE 0.9 03/17/2017    OTHER active problems: See review of systems  Allergies as of 03/24/2017   No Known Allergies     Medication List       Accurate as of 03/24/17  5:14 PM. Always use your most recent med list.          Alpha Lipoic Acid 200 MG Caps Take 200 mg by mouth. 2 pills daily   aspirin 81 MG tablet Take 81 mg by mouth daily.   BAYER MICROLET LANCETS lancets Use to check blood sugar two times per day   BIOTENE ORALBALANCE DRY MOUTH Gel Apply 1/2 inch length onto tongue and spread evenly repeat as often as needed Generic okay if available   canagliflozin 300 MG Tabs tablet Commonly  known as:  INVOKANA Take 1 tablet (300 mg total) by mouth daily before breakfast.   CONTOUR NEXT EZ MONITOR w/Device Kit Use to check blood sugar two times per day   gabapentin 600 MG tablet Commonly known as:  NEURONTIN take 1 tablet by mouth three times a day   glimepiride 1 MG tablet Commonly known as:  AMARYL take 1 tablet by mouth at bedtime   glucose blood test strip Commonly known as:  BAYER CONTOUR NEXT TEST Use to check blood sugar two times per day   losartan 25 MG tablet Commonly known as:  COZAAR take 1 tablet by mouth once daily   Magnesium Oxide 400 (240 Mg) MG Tabs Take 1 tablet by mouth 3 (three) times daily. Take 1 in the morning and 2 in the evening.   metFORMIN 500 MG 24 hr tablet Commonly known as:  GLUCOPHAGE-XR take 4 tablets by mouth once daily WITH SUPPER   multivitamin with minerals Tabs tablet Take 1 tablet by mouth daily.   NOVOTWIST 32G X 5 MM Misc Generic drug:  Insulin Pen Needle use daily   pantoprazole 40 MG tablet Commonly known as:  PROTONIX take 1 tablet by mouth twice a day   pravastatin 40 MG tablet Commonly known as:  PRAVACHOL Take 40 mg by mouth every morning.   RESTORA Caps Take 1 capsule by mouth daily.   sertraline 100 MG tablet Commonly known as:  ZOLOFT Take 2 tablets (200 mg total) by mouth daily.   VICTOZA 18 MG/3ML Sopn Generic drug:  liraglutide inject 1.2 milligram subcutaneously daily   vitamin B-12 500 MCG tablet Commonly known as:  CYANOCOBALAMIN Take 500 mcg by mouth daily.   VITAMIN D PO Take 1 tablet by mouth daily. D3 1000 IU   VITAMIN E PO Take 1 capsule by mouth daily. 400 IU daily       Allergies: No Known Allergies  Past Medical History:  Diagnosis Date  . Diabetes mellitus without complication ([place])   . Hyperlipidemia   . Hypertension   . Ischemic colitis [facility])     Past Surgical History:  Procedure Laterality Date  . COLONOSCOPY  06/01/2014  . NASAL SEPTUM SURGERY       Family History  Problem Relation Age of Onset  . Hypertension Mother   . Diabetes Maternal Grandfather   . Colon cancer Neg Hx   . Throat cancer Neg Hx   . Prostate cancer Neg Hx   . Pancreatic cancer Neg Hx   .  Heart disease Neg Hx   . Kidney disease Neg Hx   . Liver disease Neg Hx     Social History:  reports that he has never smoked. He has never used smokeless tobacco. He reports that he does not drink alcohol or use drugs.    Review of Systems       HYPERTENSION: Taking 25 mg losartan, also taking Invokana  Home BP upto 130/90 But blood pressure has been better here and with PCP  BP Readings from Last 3 Encounters:  03/24/17 128/82  02/10/17 134/76  12/23/16 124/86        Hyperlipidemia: taking pravastatin  40 mg for several years. .Lipids  show last LDL90.     Lab Results  Component Value Date   CHOL 153 10/25/2015   HDL 38 10/25/2015   LDLCALC 90 03/17/2017   TRIG 142 10/25/2015     HYPOMAGNESEMIA: Followed by PCP  He has had symptoms of neuropathy in feet.   He mostly had burning, Also has occasional tingling   He  takes gabapentin 600 mg tid with relief and has only symptoms occasionally    Foot exam last: 8/17  Physical Examination:  BP 128/82 (BP Location: Left Arm)   Pulse 71   Ht 6' (1.829 m)   Wt 294 lb (133.4 kg)   SpO2 94%   BMI 39.87 kg/m      ASSESSMENT/PLAN:   Diabetes type 2:  See history of present illness for detailed discussion of current diabetes management, blood sugar patterns and problems identified  His A1c is showing reasonably good control of his diabetes with the result of 6.6 However he has gained weight and not exercised Blood sugars at home are fairly good but has not done his fasting readings which were previously high  Recommendations: Strongly encouraged him to start going to the gym for exercise since he already has a membership and this has previously helped his weight significantly Also will set  up consultation with dietitian Since he may benefit from increased dose of Invokana with overall better blood sugar control, improved weight and blood pressure will increase the dose to 300 mg, for now he can take 2 of the 100 mg tablets he has at home  Follow-up with A1c in 3 months  HYPERTENSION: Appears fairly well controlled, will improve with increasing Invokana   Patient Instructions  Invokana 2 in am, then next is 353m      Lonnell Chaput 03/24/2017, 5:14 PM

## 2017-03-29 DIAGNOSIS — H5213 Myopia, bilateral: Secondary | ICD-10-CM | POA: Diagnosis not present

## 2017-03-29 DIAGNOSIS — E119 Type 2 diabetes mellitus without complications: Secondary | ICD-10-CM | POA: Diagnosis not present

## 2017-03-29 DIAGNOSIS — H524 Presbyopia: Secondary | ICD-10-CM | POA: Diagnosis not present

## 2017-03-29 DIAGNOSIS — H52203 Unspecified astigmatism, bilateral: Secondary | ICD-10-CM | POA: Diagnosis not present

## 2017-03-29 DIAGNOSIS — H2513 Age-related nuclear cataract, bilateral: Secondary | ICD-10-CM | POA: Diagnosis not present

## 2017-03-29 LAB — HM DIABETES EYE EXAM

## 2017-04-06 ENCOUNTER — Other Ambulatory Visit: Payer: Self-pay | Admitting: Internal Medicine

## 2017-04-19 ENCOUNTER — Other Ambulatory Visit: Payer: Self-pay | Admitting: Endocrinology

## 2017-04-19 DIAGNOSIS — I10 Essential (primary) hypertension: Secondary | ICD-10-CM

## 2017-05-02 ENCOUNTER — Other Ambulatory Visit: Payer: Self-pay | Admitting: Endocrinology

## 2017-05-09 ENCOUNTER — Other Ambulatory Visit: Payer: Self-pay | Admitting: Endocrinology

## 2017-05-19 ENCOUNTER — Encounter: Payer: Self-pay | Admitting: Internal Medicine

## 2017-05-19 ENCOUNTER — Ambulatory Visit (INDEPENDENT_AMBULATORY_CARE_PROVIDER_SITE_OTHER): Payer: BLUE CROSS/BLUE SHIELD | Admitting: Internal Medicine

## 2017-05-19 VITALS — BP 127/71 | HR 79 | Temp 98.2°F | Wt 299.9 lb

## 2017-05-19 DIAGNOSIS — E1159 Type 2 diabetes mellitus with other circulatory complications: Secondary | ICD-10-CM | POA: Diagnosis not present

## 2017-05-19 DIAGNOSIS — F329 Major depressive disorder, single episode, unspecified: Secondary | ICD-10-CM

## 2017-05-19 DIAGNOSIS — Z7984 Long term (current) use of oral hypoglycemic drugs: Secondary | ICD-10-CM

## 2017-05-19 DIAGNOSIS — Z79899 Other long term (current) drug therapy: Secondary | ICD-10-CM

## 2017-05-19 DIAGNOSIS — I152 Hypertension secondary to endocrine disorders: Secondary | ICD-10-CM | POA: Diagnosis not present

## 2017-05-19 DIAGNOSIS — E1142 Type 2 diabetes mellitus with diabetic polyneuropathy: Secondary | ICD-10-CM

## 2017-05-19 DIAGNOSIS — I10 Essential (primary) hypertension: Principal | ICD-10-CM

## 2017-05-19 DIAGNOSIS — F321 Major depressive disorder, single episode, moderate: Secondary | ICD-10-CM

## 2017-05-19 NOTE — Progress Notes (Signed)
CC: Depression  HPI:  [name] is a 56 y.o. male with PMH of T2DM, HTN, Depression who presents for follow up management of his HTN, T2DM, and Depression. Please see problem based charting for status of patient's chronic medical issues.  Depression: Patient currently takes Sertraline 200 mg daily for his depression. He previously was going to therapy, however he has stopped once he changed from first to third shift at work. He says the change in shifts have been beneficial for his mood as he was dealing with stress from coworkers when he was on first shift. He does say that he has not quite adjusted to the change in his schedule as he has stopped going to the [facility] to exercise since this change in work schedule. He says he is sleeping well after work. He denies any SI or HI. He feels his current medication dose is helpful and does not want to make a change at this time.  HTN: Patient reports adherence to Losartan 25 mg daily. BP is 127/71 this visit.  T2DM: Follows closely with Endocrinology, Dr. Dwyane Dee. He reports adherence to Metformin 500 mg 24h once daily, Canagliflozin 300 mg daily, Glimepiride 1 mg daily, and Liraglutide 1.2 mg subq daily. His last Hgb A1c was 6.6 on 03/17/2017. He acknowledges having a set back in consistent eating patterns and exercise. He checks his blood sugars at home and denies any hypoglycemic or hyperglycemic events.   Past Medical History:  Diagnosis Date  . Diabetes mellitus without complication ([place])   . Hyperlipidemia   . Hypertension   . Ischemic colitis ([place])     Review of Systems:   Review of Systems  Constitutional: Negative for weight loss.  Respiratory: Negative for shortness of breath.   Cardiovascular: Negative for chest pain and palpitations.  Genitourinary: Negative for dysuria and frequency.  Psychiatric/Behavioral: Positive for depression. Negative for substance abuse and suicidal ideas. The patient does not have insomnia.    Depression with improved mood     Physical Exam:  Vitals:   05/19/17 1658  BP: 127/71  Pulse: 79  Temp: 98.2 F (36.8 C)  TempSrc: Oral  SpO2: 96%  Weight: 299 lb 14.4 oz (136 kg)   Physical Exam  Constitutional: He is oriented to person, place, and time. He appears well-developed and well-nourished. No distress.  Cardiovascular: Normal rate and regular rhythm.   No murmur heard. Pulmonary/Chest: Effort normal. No respiratory distress. He has no wheezes. He has no rales.  Neurological: He is alert and oriented to person, place, and time.  Skin: He is not diaphoretic.  Psychiatric: He has a normal mood and affect.    Assessment & Plan:   See Encounters Tab for problem based charting.  Patient discussed with Dr. Evette Doffing  Major depression Patient reports subjective improvement in his depressive mood since his work schedule change. He does not want to adjust his medications at this time or return to therapy. PHQ-9 not completed this visit, however we will obtain this on follow up visits to monitor his depression. - Continue Sertraline 200 mg daily - Encouraged restarting an exercise regimen  Hypertension associated with diabetes ([place]) BP Readings from Last 3 Encounters:  05/19/17 127/71  03/24/17 128/82  02/10/17 134/76   Blood pressure is currently well controlled. - Continue Losartan 25 mg daily  Type 2 diabetes mellitus Last Hgb A1c of 6.6 shows improved control, however anticipate this may come up on recheck given his inconsistent eating pattern and no exercise  in the last 2 months.  - Continue Metformin 500 mg 24h once daily, Canagliflozin 300 mg daily, Glimepiride 1 mg daily, and Liraglutide 1.2 mg subq daily - Continue to follow up with Endocrinology as scheduled

## 2017-05-19 NOTE — Patient Instructions (Addendum)
It was a pleasure to see you again Jack Wallace.  Please continue your medications as you are.  Please try your best to get back to a regular exercise regimen. You can start slowly and increase activity as tolerated.  I am glad your mood is improved with your shift change.  Please follow up with me in 3 months or sooner if needed.

## 2017-05-24 NOTE — Assessment & Plan Note (Signed)
Last Hgb A1c of 6.6 shows improved control, however anticipate this may come up on recheck given his inconsistent eating pattern and no exercise in the last 2 months.  - Continue Metformin 500 mg 24h once daily, Canagliflozin 300 mg daily, Glimepiride 1 mg daily, and Liraglutide 1.2 mg subq daily - Continue to follow up with Endocrinology as scheduled

## 2017-05-24 NOTE — Assessment & Plan Note (Signed)
BP Readings from Last 3 Encounters:  05/19/17 127/71  03/24/17 128/82  02/10/17 134/76   Blood pressure is currently well controlled. - Continue Losartan 25 mg daily

## 2017-05-24 NOTE — Assessment & Plan Note (Signed)
Patient reports subjective improvement in his depressive mood since his work schedule change. He does not want to adjust his medications at this time or return to therapy. PHQ-9 not completed this visit, however we will obtain this on follow up visits to monitor his depression. - Continue Sertraline 200 mg daily - Encouraged restarting an exercise regimen

## 2017-05-25 NOTE — Progress Notes (Signed)
Internal Medicine Clinic Attending  Case discussed with Dr. Patel,Vishal at the time of the visit.  We reviewed the resident's history and exam and pertinent patient test results.  I agree with the assessment, diagnosis, and plan of care documented in the resident's note.  

## 2017-06-14 ENCOUNTER — Ambulatory Visit (INDEPENDENT_AMBULATORY_CARE_PROVIDER_SITE_OTHER): Payer: BLUE CROSS/BLUE SHIELD | Admitting: Internal Medicine

## 2017-06-14 VITALS — BP 130/69 | HR 94 | Temp 98.3°F | Ht 72.0 in | Wt 292.7 lb

## 2017-06-14 DIAGNOSIS — R110 Nausea: Secondary | ICD-10-CM

## 2017-06-14 MED ORDER — ONDANSETRON 8 MG PO TBDP: 8.0000 mg | ORAL_TABLET | Freq: Three times a day (TID) | ORAL | 0 refills | Status: DC | PRN

## 2017-06-14 NOTE — Progress Notes (Signed)
   CC: Nausea for 5 days  HPI:  [name] is a 56 y.o. who has been feeling nausea consistently for the past 5 days.   See problem based assessment and plan below for additional details  Past Medical History:  Diagnosis Date  . Diabetes mellitus without complication ([place])   . Hyperlipidemia   . Hypertension   . Ischemic colitis ([place])     Review of Systems:  Review of Systems  Constitutional: Negative for chills and fever.  Cardiovascular: Negative for chest pain.  Gastrointestinal: Positive for nausea. Negative for constipation, diarrhea and vomiting.  Genitourinary: Negative for frequency.  Neurological: Negative for dizziness.  Endo/Heme/Allergies: Negative for polydipsia.    Physical Exam: Physical Exam  Constitutional: He is well-developed, well-nourished, and in no distress.  HENT:  Mouth/Throat: Oropharynx is clear and moist. No oropharyngeal exudate.  Cardiovascular: Normal rate and regular rhythm.   Pulmonary/Chest: Effort normal and breath sounds normal.  Abdominal: Soft. Bowel sounds are normal. He exhibits no distension. There is no tenderness.  Skin: Skin is warm and dry. No rash noted.    Vitals:   06/14/17 0841  BP: 130/69  Pulse: 94  Temp: 98.3 F (36.8 C)  TempSrc: Oral  SpO2: 96%  Weight: 292 lb 11.2 oz (132.8 kg)  Height: 6' (1.829 m)     Assessment & Plan:   See Encounters Tab for problem based charting.  Patient discussed with Dr. Daryll Drown

## 2017-06-14 NOTE — Patient Instructions (Signed)
It was a pleasure to see you today Jack Wallace.  I recommend holding off on your Invokana for now and see if the nausea improves. You can discuss restarting this medicine again with your endocrinologist at the upcoming appointment.  In the meantime I have prescribed a nausea medicine you may take as needed until your symptoms improve. If the symptoms do not improve after several days without the invokana we may need to consider alternate testing.

## 2017-06-16 ENCOUNTER — Other Ambulatory Visit: Payer: Self-pay | Admitting: Endocrinology

## 2017-06-16 NOTE — Assessment & Plan Note (Signed)
HPI: He has been nauseated constantly for the past 5 days. Prior to this he has not been having any difficulty with nausea, vomiting, abdominal pain, constipation, or diarrhea. He cannot recall any changes to his diet. He has not had any illnesses with fever or chills or taken any antibiotics. He did change his medication recently with titration up of his canagliflozin by Dr. Dwyane Dee, his endocrinologist. He recalls having nausea with this medicine in the past but had been changed to not taking it for several years.  A: New persistent nausea for 5 days He does not have any concerning features such as not tolerating liquids, abdominal pain, or weight loss This might be related to his recent medicine change or could be a recurrence of his known chronic nausea multiple factors such as dyspepsia versus past ischemic bowel disease or gastroparesis  P: I recommended he stop taking the canagliflozin and see if symptoms improve He can discuss resuming this medicine or not at his next meeting with Dr. Dwyane Dee in July Prescribed limited course of by mouth Zofran for nausea relief while waiting for symptoms to improve Recommended he call back to clinic if there is no resolution of symptoms after several days the medication change

## 2017-06-18 NOTE — Progress Notes (Signed)
Internal Medicine Clinic Attending  Case discussed with Dr. Rice at the time of the visit.  We reviewed the resident's history and exam and pertinent patient test results.  I agree with the assessment, diagnosis, and plan of care documented in the resident's note.  

## 2017-06-23 NOTE — Progress Notes (Signed)
Patient ID: Jack Wallace, male   DOB: 1961/03/13, 56 y.o.   MRN: [id number]   Reason for Appointment : Followup of Type 2 Diabetes   History of Present Illness            Diagnosis: Type 2 diabetes mellitus, date of diagnosis: 1993         Past diabetes history: He has been treated with various drugs for his diabetes over the last several years. He has been taking metformin for at least 10 years and has been tolerating this. Over the years he has had additional medications to improve his control. He was also taking Actos previously and not clear if he had any side effects. This was stopped because of fear of long-term effects The last 5 years he was on glipizide also. Previously was taking 10 mg twice a day.   About 2 years ago his blood sugars were poorly controlled with readings up to 300 In early 2014 he started walking and lost 15 pounds. Subsequently he was getting low blood sugar during the day especially before lunch and sometimes in the afternoon His glipizide was  reduced but even with 5 mg twice a day he was getting hypoglycemia.  This was reduced and Invokana added. Invokana was stopped because of lack of clear benefit in 09/2013 Because of inadequate control he was started on Victoza on his visit in 10/14 in addition to his metformin and glipizide.  Recent history:   Non-insulin hypoglycemic drugs: Metformin Er 1500 mg daily,  Victoza 0.6 mg daily  His A1c is recently 6.7 done from outside lab    Since he had weight gain and mostly high fasting readings he was started on Invokana 100 mg daily in 7/17  Current problems, blood sugar patterns and management:  His Victoza had been reduced previously because of persistent nausea and he is still taking 0.6 mg  He was apparently complaining of nausea and his PCP about 10 days ago told him to stop his Invokana  Since then his fasting blood sugars have been increasing and now as high as 172  Previously had been told  to check more readings fasting and after meals but he is now only doing readings in the mornings and rarely midday  He did lose weight with having more nausea last month  Also he is trying to do more exercise recently    Monitors blood glucose:  0.7 times a day        Glucometer:  Contour  Blood Glucose readings from meter download:   Mean values apply above for all meters except median for One Touch  PRE-MEAL Fasting Lunch Dinner Bedtime Overall  Glucose range: 86-1 72       Mean/median: 119     117   Hypoglycemia : None     Meals: 2 meals per day.  usually eating a frozen meal at breakfast 9 am and supper 4pm and Usually crackers at lunch.   Dietician visit: Most recent:?  Wt Readings from Last 3 Encounters:  06/24/17 292 lb 3.2 oz (132.5 kg)  06/14/17 292 lb 11.2 oz (132.8 kg)  05/19/17 299 lb 14.4 oz (136 kg)      Physical activity: exercise: going to the [facility] 6/7 Days a week doing workout for up to one hour         Lab Results  Component Value Date   HGBA1C 6.6 03/17/2017   HGBA1C 6.3 02/10/2017   HGBA1C 7.0 11/10/2016  Lab Results  Component Value Date   MICROALBUR 2.4 (H) 06/24/2017   LDLCALC 90 03/17/2017   CREATININE 0.9 03/17/2017    OTHER active problems: See review of systems  Allergies as of 06/24/2017   No Known Allergies     Medication List       Accurate as of 06/24/17  9:04 PM. Always use your most recent med list.          Alpha Lipoic Acid 200 MG Caps Take 200 mg by mouth. 2 pills daily   aspirin 81 MG tablet Take 81 mg by mouth daily.   BAYER MICROLET LANCETS lancets Use to check blood sugar two times per day   BIOTENE ORALBALANCE DRY MOUTH Gel Apply 1/2 inch length onto tongue and spread evenly repeat as often as needed Generic okay if available   CONTOUR NEXT EZ MONITOR w/Device Kit Use to check blood sugar two times per day   empagliflozin 10 MG Tabs tablet Commonly known as:  JARDIANCE Take 10 mg by mouth daily with  breakfast.   gabapentin 600 MG tablet Commonly known as:  NEURONTIN take 1 tablet by mouth three times a day   glimepiride 1 MG tablet Commonly known as:  AMARYL take 1 tablet by mouth at bedtime   glucose blood test strip Commonly known as:  BAYER CONTOUR NEXT TEST Use to check blood sugar two times per day   losartan 25 MG tablet Commonly known as:  COZAAR take 1 tablet by mouth once daily   Magnesium Oxide 400 (240 Mg) MG Tabs Take 1 tablet by mouth 3 (three) times daily. Take 1 in the morning and 2 in the evening.   metFORMIN 500 MG 24 hr tablet Commonly known as:  GLUCOPHAGE-XR take 4 tablets by mouth once daily WITH SUPPER   multivitamin with minerals Tabs tablet Take 1 tablet by mouth daily.   NOVOTWIST 32G X 5 MM Misc Generic drug:  Insulin Pen Needle use daily   ondansetron 8 MG disintegrating tablet Commonly known as:  ZOFRAN-ODT Take 1 tablet (8 mg total) by mouth every 8 (eight) hours as needed for nausea or vomiting.   pantoprazole 40 MG tablet Commonly known as:  PROTONIX take 1 tablet by mouth twice a day   pravastatin 40 MG tablet Commonly known as:  PRAVACHOL Take 40 mg by mouth every morning.   RESTORA Caps Take 1 capsule by mouth daily.   sertraline 100 MG tablet Commonly known as:  ZOLOFT Take 2 tablets (200 mg total) by mouth daily.   VICTOZA 18 MG/3ML Sopn Generic drug:  liraglutide inject 1.2 milligram subcutaneously daily   vitamin B-12 500 MCG tablet Commonly known as:  CYANOCOBALAMIN Take 500 mcg by mouth daily.   vitamin C 1000 MG tablet Take 1,000 mg by mouth daily.   VITAMIN D PO Take 1 tablet by mouth daily. D3 1000 IU   VITAMIN E PO Take 1 capsule by mouth daily. 400 IU daily       Allergies: No Known Allergies  Past Medical History:  Diagnosis Date  . Diabetes mellitus without complication ([place])   . Hyperlipidemia   . Hypertension   . Ischemic colitis [facility])     Past Surgical History:  Procedure  Laterality Date  . COLONOSCOPY  06/01/2014  . NASAL SEPTUM SURGERY      Family History  Problem Relation Age of [place]set  . Hypertension Mother   . Diabetes Maternal Grandfather   . Colon cancer Neg Hx   .  Throat cancer Neg Hx   . Prostate cancer Neg Hx   . Pancreatic cancer Neg Hx   . Heart disease Neg Hx   . Kidney disease Neg Hx   . Liver disease Neg Hx     Social History:  reports that he has never smoked. He has never used smokeless tobacco. He reports that he does not drink alcohol or use drugs.    Review of Systems       HYPERTENSION: Taking 25 mg losartan, Recently not taking Invokana  Home BP upto 130-140/70-95     BP Readings from Last 3 Encounters:  06/24/17 (!) 132/92  06/14/17 130/69  05/19/17 127/71        Hyperlipidemia: taking pravastatin  40 mg for several years. .Lipids  show last LDL 83.     Lab Results  Component Value Date   CHOL 153 10/25/2015   HDL 38 10/25/2015   LDLCALC 90 03/17/2017   TRIG 142 10/25/2015     HYPOMAGNESEMIA: Followed by PCP  He has had symptoms of neuropathy in feet.   He mostly had burning, Also has occasional tingling   He  takes gabapentin 600 mg tid with relief and has only symptoms occasionally    Foot exam last: 8/17  Physical Examination:  BP (!) 132/92   Pulse 74   Ht 6' (1.829 m)   Wt 292 lb 3.2 oz (132.5 kg)   SpO2 95%   BMI 39.63 kg/m      ASSESSMENT/PLAN:   Diabetes type 2:  See history of present illness for detailed discussion of current diabetes management, blood sugar patterns and problems identified  His A1c is 6.7 but his blood sugars appear to be going up now with stopping Invokana He is not doing any readings after meals Weight is down but likely from more exercise   Recommendations: Trial of Jardiance instead of Invokana, discussed that this may cause less nausea and he can call if he is having any problems He can start with 10 mg for now No change in Victoza or  metformin More consistent monitoring after various meals and less in the morning  Follow-up with A1c in 3 months  HYPERTENSION: Appears to be increasing with stopping Invokana   Patient Instructions  Check blood sugars on waking up  3/7  Also check blood sugars about 2 hours after a meal and do this after different meals by rotation  Recommended blood sugar levels on waking up is 90-130 and about 2 hours after meal is 130-160  Please bring your blood sugar monitor to each visit, thank you       [facility] 06/24/2017, 9:04 PM

## 2017-06-24 ENCOUNTER — Encounter: Payer: Self-pay | Admitting: Endocrinology

## 2017-06-24 ENCOUNTER — Telehealth: Payer: Self-pay

## 2017-06-24 ENCOUNTER — Ambulatory Visit (INDEPENDENT_AMBULATORY_CARE_PROVIDER_SITE_OTHER): Payer: BLUE CROSS/BLUE SHIELD | Admitting: Endocrinology

## 2017-06-24 ENCOUNTER — Other Ambulatory Visit: Payer: Self-pay

## 2017-06-24 VITALS — BP 132/92 | HR 74 | Ht 72.0 in | Wt 292.2 lb

## 2017-06-24 DIAGNOSIS — E1165 Type 2 diabetes mellitus with hyperglycemia: Secondary | ICD-10-CM

## 2017-06-24 LAB — MICROALBUMIN / CREATININE URINE RATIO
CREATININE, U: 156.9 mg/dL
MICROALB/CREAT RATIO: 1.5 mg/g (ref 0.0–30.0)
Microalb, Ur: 2.4 mg/dL — ABNORMAL HIGH (ref 0.0–1.9)

## 2017-06-24 MED ORDER — EMPAGLIFLOZIN 10 MG PO TABS: 10.0000 mg | ORAL_TABLET | Freq: Every day | ORAL | 3 refills | Status: DC

## 2017-06-24 NOTE — Telephone Encounter (Signed)
[place] and spoke with Hassan Rowan to send over the most recent eye exam for patient. She stated that she would fax it over.

## 2017-06-24 NOTE — Patient Instructions (Signed)
Check blood sugars on waking up 3/7   Also check blood sugars about 2 hours after a meal and do this after different meals by rotation  Recommended blood sugar levels on waking up is 90-130 and about 2 hours after meal is 130-160  Please bring your blood sugar monitor to each visit, thank you  

## 2017-07-01 ENCOUNTER — Other Ambulatory Visit: Payer: Self-pay | Admitting: Endocrinology

## 2017-07-14 ENCOUNTER — Other Ambulatory Visit: Payer: Self-pay | Admitting: Internal Medicine

## 2017-07-15 NOTE — Telephone Encounter (Signed)
Nausea should be reassessed with PCP at next appointment.  Refill X 1

## 2017-07-21 ENCOUNTER — Encounter: Payer: Self-pay | Admitting: Gastroenterology

## 2017-07-21 ENCOUNTER — Ambulatory Visit (INDEPENDENT_AMBULATORY_CARE_PROVIDER_SITE_OTHER): Payer: BLUE CROSS/BLUE SHIELD | Admitting: Gastroenterology

## 2017-07-21 VITALS — BP 126/90 | HR 84 | Ht 72.0 in | Wt 291.0 lb

## 2017-07-21 DIAGNOSIS — K581 Irritable bowel syndrome with constipation: Secondary | ICD-10-CM

## 2017-07-21 DIAGNOSIS — R1013 Epigastric pain: Secondary | ICD-10-CM | POA: Diagnosis not present

## 2017-07-21 DIAGNOSIS — K219 Gastro-esophageal reflux disease without esophagitis: Secondary | ICD-10-CM | POA: Diagnosis not present

## 2017-07-21 DIAGNOSIS — K5904 Chronic idiopathic constipation: Secondary | ICD-10-CM

## 2017-07-21 MED ORDER — POLYETHYLENE GLYCOL 3350 17 GM/SCOOP PO POWD: ORAL | 5 refills | Status: DC

## 2017-07-21 MED ORDER — DICYCLOMINE HCL 10 MG PO CAPS: 10.0000 mg | ORAL_CAPSULE | Freq: Three times a day (TID) | ORAL | 11 refills | Status: DC

## 2017-07-21 NOTE — Progress Notes (Signed)
    History of Present Illness: This is a 56 year old male referred by Zada Finders, MD for the evaluation of constipation, nausea and abdominal pain. He has a history of IBS-C. He has constipation with hard stools, straining and 2-3 days between bowel movements. Increased water and daily Colace have not helped. He notes epigastric pain and nausea generally occurring with and after bowel movements. Colonoscopy in 05/2014 showed ischemic colitis and internal hemorrhoid. Evaluation in 2015 for RUQ pain showed a normal [place] and normal HIDA with EF=44%. History of GERD with symptoms controlled on pantoprazole. Denies weight loss, diarrhea, change in stool caliber, melena, hematochezia, vomiting, dysphagia, reflux symptoms, chest pain..   Review of Systems: Pertinent positive and negative review of systems were noted in the above HPI section. All other review of systems were otherwise negative.  Current Medications, Allergies, Past Medical History, Past Surgical History, Family History and Social History were reviewed in [employer] record.  Physical Exam: General: Well developed, well nourished, obese, no acute distress Head: Normocephalic and atraumatic Eyes:  sclerae anicteric, EOMI Ears: Normal auditory acuity Mouth: No deformity or lesions Neck: Supple, no masses or thyromegaly Lungs: Clear throughout to auscultation Heart: Regular rate and rhythm; no murmurs, rubs or bruits Abdomen: Soft, mild epigastric tenderness and non distended. No masses, hepatosplenomegaly or hernias noted. Normal Bowel sounds Musculoskeletal: Symmetrical with no gross deformities  Skin: No lesions on visible extremities Pulses:  Normal pulses noted Extremities: No clubbing, cyanosis, edema or deformities noted Neurological: Alert oriented x 4, grossly nonfocal Cervical Nodes:  No significant cervical adenopathy Inguinal Nodes: No significant inguinal adenopathy Psychological:  Alert and  cooperative. Normal mood and affect  Assessment and Recommendations:  1. Suspected IBS-C. Miralax 1-3 times per day with dose adjusted for a complete BM each day without straining. Dicyclomine 10 mg po tid prn. Trial of Linzess or Trulance if symptoms not well controlled. REV in 4 weeks.   2. GERD. Epigastric pain likely related to GERD or IBS. Continue pantoprazole 40 mg daily and antireflux measures. Consider EGD if symptoms persist. REV in 4 weeks.   cc: Zada Finders, MD [address] [place], [place] [id number]

## 2017-07-21 NOTE — Patient Instructions (Signed)
We have sent the following medications to your pharmacy for you to pick up at your convenience: Bentyl and Miralax.   Normal BMI (Body Mass Index- based on height and weight) is between 19 and 25. Your BMI today is Body mass index is 39.47 kg/m. Marland Kitchen Please consider follow up  regarding your BMI with your Primary Care Provider.  Thank you for choosing me and [facility].  Pricilla Riffle. Dagoberto Ligas., MD., Marval Regal

## 2017-08-15 ENCOUNTER — Other Ambulatory Visit: Payer: Self-pay | Admitting: Endocrinology

## 2017-08-20 ENCOUNTER — Ambulatory Visit: Payer: BLUE CROSS/BLUE SHIELD | Admitting: Physician Assistant

## 2017-08-25 ENCOUNTER — Encounter: Payer: BLUE CROSS/BLUE SHIELD | Admitting: Internal Medicine

## 2017-08-31 ENCOUNTER — Other Ambulatory Visit: Payer: Self-pay | Admitting: Endocrinology

## 2017-09-20 ENCOUNTER — Other Ambulatory Visit: Payer: BLUE CROSS/BLUE SHIELD

## 2017-09-22 ENCOUNTER — Other Ambulatory Visit (INDEPENDENT_AMBULATORY_CARE_PROVIDER_SITE_OTHER): Payer: BLUE CROSS/BLUE SHIELD

## 2017-09-22 ENCOUNTER — Telehealth: Payer: Self-pay | Admitting: Endocrinology

## 2017-09-22 DIAGNOSIS — E1165 Type 2 diabetes mellitus with hyperglycemia: Secondary | ICD-10-CM | POA: Diagnosis not present

## 2017-09-22 LAB — LIPID PANEL
CHOL/HDL RATIO: 4
Cholesterol: 158 mg/dL (ref 0–200)
HDL: 37.1 mg/dL — ABNORMAL LOW (ref >39.00)
NonHDL: 120.44
Triglycerides: 209 mg/dL — ABNORMAL HIGH (ref 0.0–149.0)
VLDL: 41.8 mg/dL — AB (ref 0.0–40.0)

## 2017-09-22 LAB — LDL CHOLESTEROL, DIRECT: LDL DIRECT: 101 mg/dL

## 2017-09-22 LAB — COMPREHENSIVE METABOLIC PANEL
ALBUMIN: 4.4 g/dL (ref 3.5–5.2)
ALT: 16 U/L (ref 0–53)
AST: 17 U/L (ref 0–37)
Alkaline Phosphatase: 48 U/L (ref 39–117)
BUN: 15 mg/dL (ref 6–23)
CHLORIDE: 99 meq/L (ref 96–112)
CO2: 28 mEq/L (ref 19–32)
CREATININE: 1.04 mg/dL (ref 0.40–1.50)
Calcium: 9.6 mg/dL (ref 8.4–10.5)
GFR: 78.23 mL/min (ref >60.00)
GLUCOSE: 99 mg/dL (ref 70–99)
Potassium: 4.1 mEq/L (ref 3.5–5.1)
SODIUM: 137 meq/L (ref 135–145)
TOTAL PROTEIN: 7.3 g/dL (ref 6.0–8.3)
Total Bilirubin: 0.8 mg/dL (ref 0.2–1.2)

## 2017-09-22 LAB — HEMOGLOBIN A1C: HEMOGLOBIN A1C: 7.8 % — AB (ref 4.6–6.5)

## 2017-09-23 ENCOUNTER — Telehealth: Payer: Self-pay | Admitting: Endocrinology

## 2017-09-23 NOTE — Progress Notes (Signed)
Patient ID: Jack Wallace, male   DOB: September 08, 1961, 56 y.o.   MRN: [id number]   Reason for Appointment : Followup of Type 2 Diabetes   History of Present Illness            Diagnosis: Type 2 diabetes mellitus, date of diagnosis: 1993         Past diabetes history: He has been treated with various drugs for his diabetes over the last several years. He has been taking metformin for at least 10 years and has been tolerating this. Over the years he has had additional medications to improve his control. He was also taking Actos previously and not clear if he had any side effects. This was stopped because of fear of long-term effects  In early 2014 he had poor control before he started walking and lost 15 pounds. Subsequently he was getting low blood sugar during the day especially before lunch and sometimes in the afternoon His glipizide was  reduced but even with 5 mg twice a day he was getting hypoglycemia.  This was reduced and Invokana added. Invokana was stopped because of lack of clear benefit in 09/2013 Because of inadequate control he was started on Victoza on his visit in 10/14 in addition to his metformin and glipizide.  Recent history:   Non-insulin hypoglycemic drugs: Metformin Er 1500 mg daily,  Victoza 0.6 mg daily in am, Jardiance 10 pm, Amaryl hs  His A1c is recently 7.8, previously has been 6.6 or better    Current problems, blood sugar patterns and management:  He was started on Jardiance on his last visit since he was not able to get Invokana  However his blood sugars are looking higher from his A1c  He thinks this may be from his going on night shift to day shift.  However not clear why his blood sugars are higher since he has not changed his diet significantly  He has not been doing any walking however which she wasn't trying to do before  He is monitoring his blood sugars mostly when he comes back from work in the morning and sometimes after his morning  meal which he thinks is his largest meal  Sporadically will have a very high reading, he did have a very high reading once  on a weekend probably from going off his diet  He does take medication regularly with the time of his medications as above   However even though he takes the Amaryl at bedtime he does not check his sugars when he wakes up, previously would have occasional high readings at this time  More recently has not done any exercise and was benefiting from this previously  Weight has gone up 7 pounds   Monitors blood glucose:  0.7 times a day        Glucometer:  Contour  Blood Glucose readings from meter download:    Mean values apply above for all meters except median for One Touch  PRE-MEAL Fasting Lunch Dinner Bedtime Overall  Glucose range: ?    117-166     Mean/median:   139   141    POST-MEAL PC Breakfast PC Lunch PC Dinner  Glucose range:  270  110-157   Mean/median:   134    Hypoglycemia : None    Dietician visit: Most recent:?  Wt Readings from Last 3 Encounters:  09/24/17 298 lb 3.2 oz (135.3 kg)  07/21/17 291 lb (132 kg)  06/24/17 292 lb 3.2 oz (  132.5 kg)      Physical activity: exercise:None recently         Lab Results  Component Value Date   HGBA1C 7.8 (H) 09/22/2017   HGBA1C 6.6 03/17/2017   HGBA1C 6.3 02/10/2017   Lab Results  Component Value Date   MICROALBUR 2.4 (H) 06/24/2017   LDLCALC 90 03/17/2017   CREATININE 1.04 09/22/2017    OTHER active problems: See review of systems  Allergies as of 09/24/2017   No Known Allergies     Medication List       Accurate as of 09/24/17  9:02 AM. Always use your most recent med list.          Alpha Lipoic Acid 200 MG Caps Take 200 mg by mouth. 2 pills daily   aspirin 81 MG tablet Take 81 mg by mouth daily.   BAYER MICROLET LANCETS lancets Use to check blood sugar two times per day   BIOTENE ORALBALANCE DRY MOUTH Gel Apply 1/2 inch length onto tongue and spread evenly repeat as  often as needed Generic okay if available   CONTOUR NEXT EZ MONITOR w/Device Kit Use to check blood sugar two times per day   dicyclomine 10 MG capsule Commonly known as:  BENTYL Take 1 capsule (10 mg total) by mouth 3 (three) times daily before meals.   empagliflozin 10 MG Tabs tablet Commonly known as:  JARDIANCE Take 10 mg by mouth daily with breakfast.   gabapentin 600 MG tablet Commonly known as:  NEURONTIN take 1 tablet by mouth three times a day   glimepiride 1 MG tablet Commonly known as:  AMARYL take 1 tablet by mouth once daily AT BEDTIME   glucose blood test strip Commonly known as:  BAYER CONTOUR NEXT TEST Use to check blood sugar two times per day   liraglutide 18 MG/3ML Sopn Commonly known as:  VICTOZA inject 1.2 milligram subcutaneously daily   losartan 25 MG tablet Commonly known as:  COZAAR take 1 tablet by mouth once daily   Magnesium Oxide 400 (240 Mg) MG Tabs Take 1 tablet by mouth 3 (three) times daily. Take 1 in the morning and 2 in the evening.   metFORMIN 500 MG 24 hr tablet Commonly known as:  GLUCOPHAGE-XR take 4 tablets by mouth daily WITH SUPPER   multivitamin with minerals Tabs tablet Take 1 tablet by mouth daily.   NOVOTWIST 32G X 5 MM Misc Generic drug:  Insulin Pen Needle use daily   ondansetron 8 MG disintegrating tablet Commonly known as:  ZOFRAN-ODT take 1 tablet by mouth every 8 hours if needed for nausea and vomiting   pantoprazole 40 MG tablet Commonly known as:  PROTONIX take 1 tablet by mouth twice a day   polyethylene glycol powder powder Commonly known as:  GLYCOLAX/MIRALAX Mix 17 grams in 8 oz of water 1-3 x daily to titrate   pravastatin 40 MG tablet Commonly known as:  PRAVACHOL take 1 tablet by mouth once daily   RESTORA Caps Take 1 capsule by mouth daily.   sertraline 100 MG tablet Commonly known as:  ZOLOFT Take 2 tablets (200 mg total) by mouth daily.   vitamin B-12 500 MCG tablet Commonly known  as:  CYANOCOBALAMIN Take 500 mcg by mouth daily.   vitamin C 1000 MG tablet Take 1,000 mg by mouth daily.   VITAMIN D PO Take 1 tablet by mouth daily. D3 1000 IU   VITAMIN E PO Take 1 capsule by mouth daily. 400 IU  daily       Allergies: No Known Allergies  Past Medical History:  Diagnosis Date  . Diabetes mellitus without complication ([place])   . Hyperlipidemia   . Hypertension   . Ischemic colitis [facility])     Past Surgical History:  Procedure Laterality Date  . COLONOSCOPY  06/01/2014  . NASAL SEPTUM SURGERY      Family History  Problem Relation Age of Onset  . Hypertension Mother   . Stroke Father   . Diabetes Maternal Grandfather   . Colon cancer Neg Hx   . Throat cancer Neg Hx   . Prostate cancer Neg Hx   . Pancreatic cancer Neg Hx   . Heart disease Neg Hx   . Kidney disease Neg Hx   . Liver disease Neg Hx     Social History:  reports that he has never smoked. He has never used smokeless tobacco. He reports that he does not drink alcohol or use drugs.    Review of Systems   He continues to have intermittent nausea, treated symptomatically Has not been evaluated by this by his PCP or gastroenterologist    HYPERTENSION: Taking 25 mg losartan, Followed by PCP but blood pressure  fluctuates Recently mostly high Currently on 10 mg Jardiance  Home BP upto 130-140/75-95    BP Readings from Last 3 Encounters:  09/24/17 134/84  07/21/17 126/90  06/24/17 (!) 132/92        Hyperlipidemia: taking pravastatin  40 mg for several years. LDL is slightly higher than usual   Lab Results  Component Value Date   CHOL 158 09/22/2017   HDL 37.10 (L) 09/22/2017   LDLCALC 90 03/17/2017   LDLDIRECT 101.0 09/22/2017   TRIG 209.0 (H) 09/22/2017   CHOLHDL 4 09/22/2017     HYPOMAGNESEMIA: Followed by PCP  He has had symptoms of neuropathy in feet.   He mostly had burning, Also has occasional tingling   He  takes gabapentin 600 mg tid with relief and has only  symptoms occasionally    Foot exam last: 8/17  Physical Examination:  BP 134/84 (Cuff Size: Large)   Pulse 80   Ht 6' (1.829 m)   Wt 298 lb 3.2 oz (135.3 kg)   SpO2 91%   BMI 40.44 kg/m      ASSESSMENT/PLAN:   Diabetes type 2:  See history of present illness for detailed discussion of current diabetes management, blood sugar patterns and problems identified  His A1c is much higher at 7.8 Even though he is on Jardiance his blood sugars are not as well controlled as when he was on Invokana and regularly exercising Also he may be going off his diet with working night shifts now Currently also on low dose Victoza and Jardiance and his blood sugars are not being monitored consistently as directed at various times Appears that he has previously benefited from exercise which he is not doing much and also has gained weight  NAUSEA: Not clear if etiology and this is treated by PCP, unlikely to be from [place] as he has stopped this before and nausea was not better and now he says he is having some dizziness and vertigo occasionally with the nausea   Recommendations: Trial of 85m Jardiance instead of 10 mg Start regular exercise, stressed the importance of regular walking or other aerobic exercise This may also enable him to lose weight which he needs to do Increase Victoza gradually as directed and work up to 1.2 mg.  Showed  him how to dial up 5 clicks before going up to 1.2  Also may try Bydureon if higher dose Victoza not tolerated. He will need to monitor blood sugars at various times including his meals on the evenings and on waking up  We will do short-term follow-up in 2 months  Lipids: He needs to watch his saturated fats better, also may do better with weight loss May be a candidate for switching Pravachol to Crestor   HYPERTENSION: Fair control, may do better with increasing Jardiance He needs to have his home blood pressure monitor compared to office reading  also   Patient Instructions  Victoza add 5 clicks for 1 week, then 1.2 mg  jardiance 62m till out   Walk daily   Counseling time on subjects discussed in assessment and plan sections is over 50% of today's 25 minute visit    Mariah Gerstenberger 09/24/2017, 9:02 AM

## 2017-09-24 ENCOUNTER — Other Ambulatory Visit: Payer: Self-pay

## 2017-09-24 ENCOUNTER — Encounter: Payer: Self-pay | Admitting: Endocrinology

## 2017-09-24 ENCOUNTER — Ambulatory Visit (INDEPENDENT_AMBULATORY_CARE_PROVIDER_SITE_OTHER): Payer: BLUE CROSS/BLUE SHIELD | Admitting: Endocrinology

## 2017-09-24 VITALS — BP 134/84 | HR 80 | Ht 72.0 in | Wt 298.2 lb

## 2017-09-24 DIAGNOSIS — E1165 Type 2 diabetes mellitus with hyperglycemia: Secondary | ICD-10-CM | POA: Diagnosis not present

## 2017-09-24 DIAGNOSIS — R110 Nausea: Secondary | ICD-10-CM

## 2017-09-24 DIAGNOSIS — I10 Essential (primary) hypertension: Secondary | ICD-10-CM

## 2017-09-24 MED ORDER — EMPAGLIFLOZIN 10 MG PO TABS: 10.0000 mg | ORAL_TABLET | Freq: Every day | ORAL | 3 refills | Status: DC

## 2017-09-24 MED ORDER — LIRAGLUTIDE 18 MG/3ML ~~LOC~~ SOPN: PEN_INJECTOR | SUBCUTANEOUS | 1 refills | Status: DC

## 2017-09-24 NOTE — Patient Instructions (Addendum)
Victoza add 5 clicks for 1 week, then 1.2 mg  jardiance 20mg  till out   Walk daily

## 2017-09-24 NOTE — Telephone Encounter (Signed)
This medication has been filled and sent to [facility] and patient is aware.

## 2017-09-24 NOTE — Telephone Encounter (Signed)
This medication has been filled by historical provider. Please advise if okay to fill??  Thanks!

## 2017-09-24 NOTE — Telephone Encounter (Signed)
ok 

## 2017-09-28 ENCOUNTER — Other Ambulatory Visit: Payer: Self-pay | Admitting: Endocrinology

## 2017-10-04 ENCOUNTER — Other Ambulatory Visit: Payer: Self-pay | Admitting: Internal Medicine

## 2017-10-11 ENCOUNTER — Other Ambulatory Visit: Payer: Self-pay | Admitting: Endocrinology

## 2017-10-19 ENCOUNTER — Other Ambulatory Visit: Payer: Self-pay | Admitting: Endocrinology

## 2017-10-19 ENCOUNTER — Encounter: Payer: BLUE CROSS/BLUE SHIELD | Admitting: Internal Medicine

## 2017-10-19 DIAGNOSIS — I10 Essential (primary) hypertension: Secondary | ICD-10-CM

## 2017-11-02 ENCOUNTER — Other Ambulatory Visit: Payer: Self-pay | Admitting: Endocrinology

## 2017-11-25 ENCOUNTER — Other Ambulatory Visit (INDEPENDENT_AMBULATORY_CARE_PROVIDER_SITE_OTHER): Payer: BLUE CROSS/BLUE SHIELD

## 2017-11-25 DIAGNOSIS — E1165 Type 2 diabetes mellitus with hyperglycemia: Secondary | ICD-10-CM

## 2017-11-25 LAB — BASIC METABOLIC PANEL
BUN: 17 mg/dL (ref 6–23)
CHLORIDE: 99 meq/L (ref 96–112)
CO2: 29 mEq/L (ref 19–32)
CREATININE: 0.95 mg/dL (ref 0.40–1.50)
Calcium: 9.4 mg/dL (ref 8.4–10.5)
GFR: 86.79 mL/min (ref >60.00)
GLUCOSE: 104 mg/dL — AB (ref 70–99)
Potassium: 4 mEq/L (ref 3.5–5.1)
Sodium: 138 mEq/L (ref 135–145)

## 2017-11-26 LAB — FRUCTOSAMINE: Fructosamine: 314 umol/L — ABNORMAL HIGH (ref 0–285)

## 2017-11-29 ENCOUNTER — Ambulatory Visit: Payer: BLUE CROSS/BLUE SHIELD | Admitting: Endocrinology

## 2017-12-01 ENCOUNTER — Encounter: Payer: BLUE CROSS/BLUE SHIELD | Admitting: Internal Medicine

## 2017-12-12 ENCOUNTER — Other Ambulatory Visit: Payer: Self-pay | Admitting: Endocrinology

## 2017-12-29 ENCOUNTER — Encounter: Payer: BLUE CROSS/BLUE SHIELD | Admitting: Internal Medicine

## 2018-01-03 ENCOUNTER — Other Ambulatory Visit: Payer: Self-pay | Admitting: Endocrinology

## 2018-01-17 ENCOUNTER — Ambulatory Visit: Payer: BLUE CROSS/BLUE SHIELD | Admitting: Endocrinology

## 2018-01-17 ENCOUNTER — Encounter: Payer: Self-pay | Admitting: Endocrinology

## 2018-01-17 VITALS — BP 129/87 | HR 76 | Ht 72.0 in | Wt 308.0 lb

## 2018-01-17 DIAGNOSIS — I10 Essential (primary) hypertension: Secondary | ICD-10-CM

## 2018-01-17 DIAGNOSIS — E1165 Type 2 diabetes mellitus with hyperglycemia: Secondary | ICD-10-CM | POA: Diagnosis not present

## 2018-01-17 DIAGNOSIS — E1142 Type 2 diabetes mellitus with diabetic polyneuropathy: Secondary | ICD-10-CM | POA: Diagnosis not present

## 2018-01-17 LAB — POCT GLYCOSYLATED HEMOGLOBIN (HGB A1C): Hemoglobin A1C: 8.2

## 2018-01-17 MED ORDER — SEMAGLUTIDE(0.25 OR 0.5MG/DOS) 2 MG/1.5ML ~~LOC~~ SOPN: 0.5000 mg | PEN_INJECTOR | SUBCUTANEOUS | 2 refills | Status: DC

## 2018-01-17 MED ORDER — EMPAGLIFLOZIN 25 MG PO TABS: 25.0000 mg | ORAL_TABLET | Freq: Every day | ORAL | 3 refills | Status: DC

## 2018-01-17 MED ORDER — LOSARTAN POTASSIUM 50 MG PO TABS: 50.0000 mg | ORAL_TABLET | Freq: Every day | ORAL | 2 refills | Status: DC

## 2018-01-17 NOTE — Progress Notes (Signed)
Patient ID: Jack Wallace, male   DOB: 1961-08-18, 57 y.o.   MRN: [id number]   Reason for Appointment : Followup of Type 2 Diabetes   History of Present Illness            Diagnosis: Type 2 diabetes mellitus, date of diagnosis: 1993         Past diabetes history: He has been treated with various drugs for his diabetes over the last several years. He has been taking metformin for at least 10 years and has been tolerating this. Over the years he has had additional medications to improve his control. He was also taking Actos previously and not clear if he had any side effects. This was stopped because of fear of long-term effects  In early 2014 he had poor control before he started walking and lost 15 pounds. Subsequently he was getting low blood sugar during the day especially before lunch and sometimes in the afternoon His glipizide was  reduced but even with 5 mg twice a day he was getting hypoglycemia.  This was reduced and Invokana added. Invokana was stopped because of lack of clear benefit in 09/2013 Because of inadequate control he was started on Victoza on his visit in 10/14 in addition to his metformin and glipizide.  Recent history:   Non-insulin hypoglycemic drugs: Metformin Er 1500 mg daily,  Victoza 0.9 mg daily in am, Jardiance 10 pm, Amaryl hs  His A1c is now 8.2 and has been consistently high since 10/18  Current problems, blood sugar patterns and management:  He was scheduled to be seen in follow-up in December but did not do so  He has gained significant amount of weight since his last visit  This is despite continuing Jardiance 10 mg at Victoza at the dose of more than 0.6 mg daily  He says he has not been having the opportunity to walk or this is limited by pain in his feet and legs  He is working night shifts  His blood sugars are mostly being checked while at work and these are fairly consistently high  Occasionally may have somewhat better blood  sugars between 10 AM-1 PM but usually not checking in the evenings    Monitors blood glucose:  0.7 times a day        Glucometer:  Contour  Blood Glucose readings from meter download:    Mean values apply above for all meters except median for One Touch  PRE-MEAL overnight morning Dinner Bedtime Overall  Glucose range: 137-264    107-264  Mean/median: 179 160 175 150 169    Hypoglycemia : None    Dietician visit: Most recent:?  Wt Readings from Last 3 Encounters:  01/17/18 (!) 308 lb (139.7 kg)  09/24/17 298 lb 3.2 oz (135.3 kg)  07/21/17 291 lb (132 kg)      Physical activity: exercise:None recently         Lab Results  Component Value Date   HGBA1C 8.2 01/17/2018   HGBA1C 7.8 (H) 09/22/2017   HGBA1C 6.6 03/17/2017   Lab Results  Component Value Date   MICROALBUR 2.4 (H) 06/24/2017   LDLCALC 90 03/17/2017   CREATININE 0.95 11/25/2017    OTHER active problems: See review of systems  Allergies as of 01/17/2018   No Known Allergies     Medication List        Accurate as of 01/17/18  9:06 PM. Always use your most recent med list.  Alpha Lipoic Acid 200 MG Caps Take 200 mg by mouth. 2 pills daily   aspirin 81 MG tablet Take 81 mg by mouth daily.   BAYER MICROLET LANCETS lancets Use to check blood sugar two times per day   BIOTENE ORALBALANCE DRY MOUTH Gel Apply 1/2 inch length onto tongue and spread evenly repeat as often as needed Generic okay if available   CONTOUR NEXT EZ MONITOR w/Device Kit Use to check blood sugar two times per day   CONTOUR NEXT TEST test strip Generic drug:  glucose blood TEST twice a day   dicyclomine 10 MG capsule Commonly known as:  BENTYL Take 1 capsule (10 mg total) by mouth 3 (three) times daily before meals.   empagliflozin 25 MG Tabs tablet Commonly known as:  JARDIANCE Take 25 mg by mouth daily.   gabapentin 600 MG tablet Commonly known as:  NEURONTIN take 1 tablet by mouth three times a day     glimepiride 1 MG tablet Commonly known as:  AMARYL take 1 tablet by mouth at bedtime   liraglutide 18 MG/3ML Sopn Commonly known as:  VICTOZA inject 1.2 milligram subcutaneously daily   losartan 25 MG tablet Commonly known as:  COZAAR take 1 tablet by mouth once daily   Magnesium Oxide 400 (240 Mg) MG Tabs Take 1 tablet by mouth 3 (three) times daily. Take 1 in the morning and 2 in the evening.   metFORMIN 500 MG 24 hr tablet Commonly known as:  GLUCOPHAGE-XR take 4 tablets by mouth daily WITH SUPPER   multivitamin with minerals Tabs tablet Take 1 tablet by mouth daily.   NOVOTWIST 32G X 5 MM Misc Generic drug:  Insulin Pen Needle use daily   ondansetron 8 MG disintegrating tablet Commonly known as:  ZOFRAN-ODT take 1 tablet by mouth every 8 hours if needed for nausea and vomiting   pantoprazole 40 MG tablet Commonly known as:  PROTONIX take 1 tablet by mouth twice a day   polyethylene glycol powder powder Commonly known as:  GLYCOLAX/MIRALAX Mix 17 grams in 8 oz of water 1-3 x daily to titrate   pravastatin 40 MG tablet Commonly known as:  PRAVACHOL take 1 tablet by mouth once daily   RESTORA Caps Take 1 capsule by mouth daily.   Semaglutide 0.25 or 0.5 MG/DOSE Sopn Commonly known as:  OZEMPIC Inject 0.5 mg into the skin once a week.   sertraline 100 MG tablet Commonly known as:  ZOLOFT Take 2 tablets (200 mg total) by mouth daily.   vitamin B-12 500 MCG tablet Commonly known as:  CYANOCOBALAMIN Take 500 mcg by mouth daily.   vitamin C 1000 MG tablet Take 1,000 mg by mouth daily.   VITAMIN D PO Take 1 tablet by mouth daily. D3 1000 IU   VITAMIN E PO Take 1 capsule by mouth daily. 400 IU daily       Allergies: No Known Allergies  Past Medical History:  Diagnosis Date  . Diabetes mellitus without complication ([place])   . Hyperlipidemia   . Hypertension   . Ischemic colitis [facility])     Past Surgical History:  Procedure Laterality Date  .  COLONOSCOPY  06/01/2014  . NASAL SEPTUM SURGERY      Family History  Problem Relation Age of Onset  . Hypertension Mother   . Stroke Father   . Diabetes Maternal Grandfather   . Colon cancer Neg Hx   . Throat cancer Neg Hx   . Prostate cancer Neg  Hx   . Pancreatic cancer Neg Hx   . Heart disease Neg Hx   . Kidney disease Neg Hx   . Liver disease Neg Hx     Social History:  reports that  has never smoked. he has never used smokeless tobacco. He reports that he does not drink alcohol or use drugs.    Review of Systems   He continues to have intermittent nausea, treated symptomatically Has not been evaluated by this by his PCP or gastroenterologist    HYPERTENSION: Taking 25 mg losartan, Followed by PCP but blood pressure  fluctuates Recently mostly high Currently on 10 mg Jardiance  Home BP upto 130-140/75-95    BP Readings from Last 3 Encounters:  01/17/18 129/87  09/24/17 134/84  07/21/17 126/90        Hyperlipidemia: taking pravastatin  40 mg for several years. LDL is slightly higher than usual   Lab Results  Component Value Date   CHOL 158 09/22/2017   HDL 37.10 (L) 09/22/2017   LDLCALC 90 03/17/2017   LDLDIRECT 101.0 09/22/2017   TRIG 209.0 (H) 09/22/2017   CHOLHDL 4 09/22/2017     HYPOMAGNESEMIA: Followed by PCP  He has had symptoms of neuropathy in feet.   He mostly had burning, Also has occasional tingling   He has had no symptoms recently and gets problems with trying to walk also including at work  He  takes gabapentin 600 mg tid with some relief   Foot exam last: 8/17  Last eye exam 03/2017  Physical Examination:  BP 129/87 (BP Location: Left Arm, Patient Position: Sitting, Cuff Size: Large)   Pulse 76   Ht 6' (1.829 m)   Wt (!) 308 lb (139.7 kg)   BMI 41.77 kg/m    repeat blood pressure 140/86 No pedal edema present  ASSESSMENT/PLAN:   Diabetes type 2 with obesity:  See history of present illness for detailed discussion of  current diabetes management, blood sugar patterns and problems identified  His A1c is again significantly high at 8.2 and not improving This is despite adding Jardiance  He has gained weight from generally a poor diet and lack of exercise or as much walking as usual Blood sugars are high mostly postprandial and being checked overnight when he is working  NEUROPATHY: He is having more symptoms partly because of his higher blood sugars, already on 1800 mg of gabapentin daily  Recommendations: Trial of 29m Jardiance instead of 10 mg, he will use up his current prescription by tag 2 tablets Switch Victoza to Ozempic Discussed house take a different, showed him how to use Ozempic pen and start titrating more gradually than usual He can initially take a dose of 10 clicks He will resume walking when able to  He will follow-up with his PCP for neuropathy, consider neurology consultation or pain management  We will do short-term follow-up in 2 months  Lipids: He Will have another panel on the next visit   HYPERTENSION: not controlled and blood pressure is consistently high He will take 50 mg of losartan instead of 25    Patient Instructions  Jardiance 2 of 171m  Losartan 2 of 2546mnd call for Rx  Start OZEMPIC 10 clicks x1 then 0.23.41DQing the pen as shown once weekly on the same day of the week.   You may inject in the stomach, thigh or arm as indicated in the brochure given. If you have any difficulties using the pen see the video  at [email or web address]  You will feel fullness of the stomach with starting the medication and should try to keep the portions at meals small.  You may experience nausea in the first few days which usually gets better over time    If you have any questions or concerns please call the office or talk to our nurse educator Leonia Reader   You may also talk to a nurse educator with the company at [phone] Useful website: [email or web address]         Counseling time on subjects discussed in assessment and plan sections is over 50% of today's 25 minute visit    Elayne Snare 01/17/2018, 9:06 PM

## 2018-01-17 NOTE — Patient Instructions (Addendum)
Jardiance 2 of 10mg    Losartan 2 of 25mg  and call for Rx  Start OZEMPIC 10 clicks x1 then 0.25mg  using the pen as shown once weekly on the same day of the week.   You may inject in the stomach, thigh or arm as indicated in the brochure given. If you have any difficulties using the pen see the video at [email or web address]  You will feel fullness of the stomach with starting the medication and should try to keep the portions at meals small.  You may experience nausea in the first few days which usually gets better over time    If you have any questions or concerns please call the office or talk to our nurse educator Leonia Reader   You may also talk to a nurse educator with the company at [phone] Useful website: [email or web address]

## 2018-01-27 ENCOUNTER — Other Ambulatory Visit: Payer: Self-pay | Admitting: Endocrinology

## 2018-01-30 ENCOUNTER — Other Ambulatory Visit: Payer: Self-pay | Admitting: Internal Medicine

## 2018-01-30 DIAGNOSIS — F321 Major depressive disorder, single episode, moderate: Secondary | ICD-10-CM

## 2018-01-31 NOTE — Telephone Encounter (Signed)
Next appt scheduled 3/6 with PCP. 

## 2018-02-10 ENCOUNTER — Other Ambulatory Visit: Payer: Self-pay | Admitting: Endocrinology

## 2018-02-18 ENCOUNTER — Encounter: Payer: BLUE CROSS/BLUE SHIELD | Attending: Endocrinology | Admitting: Dietician

## 2018-02-18 ENCOUNTER — Encounter: Payer: Self-pay | Admitting: Dietician

## 2018-02-18 DIAGNOSIS — E1142 Type 2 diabetes mellitus with diabetic polyneuropathy: Secondary | ICD-10-CM | POA: Diagnosis not present

## 2018-02-18 DIAGNOSIS — Z713 Dietary counseling and surveillance: Secondary | ICD-10-CM | POA: Diagnosis not present

## 2018-02-18 NOTE — Patient Instructions (Signed)
Call to find the time of your EYE appointment. Consider the Pool habit again.   Consider a small meal when you are at work. Choose lean meat, skinless chicken, fish Mindful eating (take one portion and enjoy rather than eating from the bag)  Continue to take your medication daily Continue to check your blood sugar  When you wake up  2 hours after the start of a meal

## 2018-02-18 NOTE — Progress Notes (Signed)
Diabetes Self-Management Education  Visit Type: First/Initial  Appt. Start Time: 0905  Appt. End Time: 1030  02/18/2018  [name], identified by name and date of birth, is a 57 y.o. male with a diagnosis of Diabetes: Type 2. Other history includes neuropathy, hypomagnesemia, hyperlipidemia, HTN, ischemic colitis, depression, and vitamin D deficiency. Medications include:  Ozempic, Jardiance, Glimiperide, Metformin, vitamin D, vitamin C, vitamin B-12, vitamin E, MVI, magnesium. Labs 07/21/2017 include Cholesterol 158, HDL 37, LDL 101, Triglycerides 209.  Patient lives alone.  He works for [employer] in a sedentary role.  He is followed by a nurse practitioner there.  His A1C was 6.6% 02/2017 and increased to 7.8% 07/21/2017 then to 8.2% 01/17/18.  Patient states that A1C went up when he switched from 1st shift to 3rd shift.  He plans on remaining on 3rd shift.  He sleeps 5-7 hours per day.  Does not note excessive sleepiness but does have increased depression.  Met with a counselor at one time but did not feel connected.  Is unmotivated to exercise.  Has a [employer] and uses the pool but has not been going.  His apartment also has a pool.  His depression score was 11 with no current thoughts of harming himself currently but reports these feelings in the past.  The[facility] card provided.  He does not cook and eats very simply.  Generally only one meal per day at 8 am since switching to nights.  Generally appetite is poor.  Lost 3 lbs in the past month.  But overall up from 291 lbs 07/21/18.    Depression is a big hinderance.  He takes his medication regularly and checks his blood sugar at times but otherwise is having a hard time finding motivation or desire to change.  He cannot tolerate most vegetables and legumes due to colitis. ASSESSMENT  Height 6' (1.829 m), weight (!) 305 lb (138.3 kg). Body mass index is 41.37 kg/m.  Diabetes Self-Management Education - 02/18/18  0933      Visit Information   Visit Type  First/Initial      Initial Visit   Diabetes Type  Type 2    Are you currently following a meal plan?  No    Are you taking your medications as prescribed?  Yes    Date Diagnosed  [place]   How would you rate your overall health?  Fair      Psychosocial Assessment   Patient Belief/Attitude about Diabetes  Defeat/Burnout    Self-care barriers  None    Self-management support  Doctor's office nurse practitioner at work    Other persons present  Patient    Patient Concerns  Nutrition/Meal planning;Weight Control;Support;Glycemic Control    Special Needs  None    Preferred Learning Style  No preference indicated    Learning Readiness  Contemplating    How often do you need to have someone help you when you read instructions, pamphlets, or other written materials from your doctor or pharmacy?  1 - Never    What is the last grade level you completed in school?  2 years college      Pre-Education Assessment   Patient understands the diabetes disease and treatment process.  Needs Review    Patient understands incorporating nutritional management into lifestyle.  Needs Review    Patient undertands incorporating physical activity into lifestyle.  Needs Review    Patient understands using medications safely.  Needs  Review    Patient understands monitoring blood glucose, interpreting and using results  Needs Review    Patient understands prevention, detection, and treatment of acute complications.  Needs Review    Patient understands prevention, detection, and treatment of chronic complications.  Needs Review    Patient understands how to develop strategies to address psychosocial issues.  Needs Review    Patient understands how to develop strategies to promote health/change behavior.  Needs Review      Complications   Last HgB A1C per patient/outside source  8.2 % increased from 7.8% 10/18 and 6.6% 3/18    How often do you check  your blood sugar?  1-2 times/day forgets often but checks at work 1 am-5 am    Fasting Blood glucose range (mg/dL)  130-179;180-200;>200 150-230    Number of hypoglycemic episodes per month  0    Number of hyperglycemic episodes per week  5    Can you tell when your blood sugar is high?  Yes    What do you do if your blood sugar is high?  nothing    Have you had a dilated eye exam in the past 12 months?  Yes    Have you had a dental exam in the past 12 months?  Yes    Are you checking your feet?  Yes    How many days per week are you checking your feet?  5      Dietary Intake   Breakfast  frozen dinner or canned food or soup and "junk food"- chips 8 am    Snack (morning)  none    Lunch  none    Snack (afternoon)  none    Dinner  none    Snack (evening)  rare chips or NABS 3 am    Beverage(s)  diet soda (Diet coke or pepsi or other), cystal lite, rare plain water      Exercise   Exercise Type  ADL's no motivation- belongs to [facility]    How many days per week to you exercise?  0    How many minutes per day do you exercise?  0    Total minutes per week of exercise  0      Patient Education   Previous Diabetes Education  Yes (please comment) 1992 when diagnosed    Disease state   Definition of diabetes, type 1 and 2, and the diagnosis of diabetes    Nutrition management   Role of diet in the treatment of diabetes and the relationship between the three main macronutrients and blood glucose level;Meal options for control of blood glucose level and chronic complications.;Meal timing in regards to the patients' current diabetes medication.    Physical activity and exercise   Role of exercise on diabetes management, blood pressure control and cardiac health.;Helped patient identify appropriate exercises in relation to his/her diabetes, diabetes complications and other health issue.    Medications  Reviewed patients medication for diabetes, action, purpose, timing of dose and side effects.     Monitoring  Purpose and frequency of SMBG.;Identified appropriate SMBG and/or A1C goals.;Daily foot exams;Yearly dilated eye exam    Acute complications  Taught treatment of hypoglycemia - the 15 rule.    Chronic complications  Relationship between chronic complications and blood glucose control;Identified and discussed with patient  current chronic complications    Psychosocial adjustment  Worked with patient to identify barriers to care and solutions;Identified and addressed patients feelings and concerns about diabetes;Role of  stress on diabetes    Personal strategies to promote health  Lifestyle issues that need to be addressed for better diabetes care      Individualized Goals (developed by patient)   Nutrition  General guidelines for healthy choices and portions discussed    Physical Activity  Exercise 3-5 times per week;45 minutes per day    Medications  take my medication as prescribed    Monitoring   test my blood glucose as discussed    Problem Solving  coping, reason to change, depression    Reducing Risk  increase portions of healthy fats;do foot checks daily;examine blood glucose patterns    Health Coping  discuss diabetes with (comment) MD, RD, CDE      Post-Education Assessment   Patient understands the diabetes disease and treatment process.  Demonstrates understanding / competency    Patient understands incorporating nutritional management into lifestyle.  Needs Review    Patient undertands incorporating physical activity into lifestyle.  Demonstrates understanding / competency    Patient understands using medications safely.  Demonstrates understanding / competency    Patient understands monitoring blood glucose, interpreting and using results  Demonstrates understanding / competency    Patient understands prevention, detection, and treatment of acute complications.  Demonstrates understanding / competency    Patient understands prevention, detection, and treatment of chronic  complications.  Demonstrates understanding / competency    Patient understands how to develop strategies to address psychosocial issues.  Needs Review    Patient understands how to develop strategies to promote health/change behavior.  Needs Review      Outcomes   Expected Outcomes  Demonstrated interest in learning. Expect positive outcomes    Future DMSE  PRN    Program Status  Completed       Individualized Plan for Diabetes Self-Management Training:   Learning Objective:  Patient will have a greater understanding of diabetes self-management. Patient education plan is to attend individual and/or group sessions per assessed needs and concerns.   Plan:   Patient Instructions  Call to find the time of your EYE appointment. Consider the Pool habit again.   Consider a small meal when you are at work. Choose lean meat, skinless chicken, fish Mindful eating (take one portion and enjoy rather than eating from the bag)  Continue to take your medication daily Continue to check your blood sugar  When you wake up  2 hours after the start of a meal     Expected Outcomes:  Demonstrated interest in learning. Expect positive outcomes  Education material provided: Living Well with Diabetes, Meal plan card, My Plate and Snack sheet  If problems or questions, patient to contact team via:  Phone  Future DSME appointment: PRN

## 2018-02-23 ENCOUNTER — Ambulatory Visit: Payer: BLUE CROSS/BLUE SHIELD | Admitting: Internal Medicine

## 2018-02-23 ENCOUNTER — Encounter: Payer: Self-pay | Admitting: Internal Medicine

## 2018-02-23 ENCOUNTER — Other Ambulatory Visit: Payer: Self-pay

## 2018-02-23 VITALS — BP 133/84 | HR 78 | Temp 97.9°F | Ht 72.0 in | Wt 304.4 lb

## 2018-02-23 DIAGNOSIS — E1159 Type 2 diabetes mellitus with other circulatory complications: Secondary | ICD-10-CM

## 2018-02-23 DIAGNOSIS — E1142 Type 2 diabetes mellitus with diabetic polyneuropathy: Secondary | ICD-10-CM

## 2018-02-23 DIAGNOSIS — Z79899 Other long term (current) drug therapy: Secondary | ICD-10-CM

## 2018-02-23 DIAGNOSIS — Z0000 Encounter for general adult medical examination without abnormal findings: Secondary | ICD-10-CM | POA: Diagnosis not present

## 2018-02-23 DIAGNOSIS — F331 Major depressive disorder, recurrent, moderate: Secondary | ICD-10-CM | POA: Diagnosis not present

## 2018-02-23 DIAGNOSIS — F321 Major depressive disorder, single episode, moderate: Secondary | ICD-10-CM

## 2018-02-23 DIAGNOSIS — I152 Hypertension secondary to endocrine disorders: Secondary | ICD-10-CM

## 2018-02-23 DIAGNOSIS — K5900 Constipation, unspecified: Secondary | ICD-10-CM

## 2018-02-23 DIAGNOSIS — Z7984 Long term (current) use of oral hypoglycemic drugs: Secondary | ICD-10-CM

## 2018-02-23 DIAGNOSIS — I10 Essential (primary) hypertension: Secondary | ICD-10-CM

## 2018-02-23 LAB — GLUCOSE, CAPILLARY: Glucose-Capillary: 89 mg/dL (ref 65–99)

## 2018-02-23 MED ORDER — SENNOSIDES-DOCUSATE SODIUM 8.6-50 MG PO TABS: 1.0000 | ORAL_TABLET | Freq: Every day | ORAL | 2 refills | Status: DC | PRN

## 2018-02-23 NOTE — Assessment & Plan Note (Addendum)
Jack Wallace has chronic depression currently taking Sertraline 200 mg daily to which he reports adherence. He feels that his mood is improved from prior and overall feels that he is doing well. He does have increased stress and low mood when thinking about family issues, however he denies any suicidal or homicidal ideation. He is no longer following with therapy but says he has started to see a nutritionist which has been beneficial. A/P: PHQ-9 is 12 this visit consistent with moderate depression. Prior PHQ-9s were >15. He feels stable on his current dose of Sertraline and would like to continue without adjustment. We discussed the possibility of either adding a second agent or referring to behavioral health if needed in the future. - Continue Sertraline 200 mg daily

## 2018-02-23 NOTE — Progress Notes (Signed)
CC: Depression  HPI:  Jack Wallace is a 57 y.o. male with T2DM, HTN, and Depression who presents for follow up management of his HTN and Depression. Please see problem based charting for status of patient's chronic medical issues.  Major depression Jack Wallace has chronic depression currently taking Sertraline 200 mg daily to which he reports adherence. He feels that his mood is improved from prior and overall feels that he is doing well. He does have increased stress and low mood when thinking about family issues, however he denies any suicidal or homicidal ideation. He is no longer following with therapy but says he has started to see a nutritionist which has been beneficial. A/P: PHQ-9 is 12 this visit consistent with moderate depression. Prior PHQ-9s were >15. He feels stable on his current dose of Sertraline and would like to continue without adjustment. We discussed the possibility of either adding a second agent or referring to behavioral health if needed in the future. - Continue Sertraline 200 mg daily  Hypertension associated with diabetes ([place]) He is currently taking Losartan 50 mg daily. BP on arrival is 133/84. BP Readings from Last 3 Encounters:  02/23/18 133/84  01/17/18 129/87  09/24/17 134/84  A/P: His blood pressure is controlled near goal of <130/80. His Jardiance was recently increased which can also effect blood pressure. Will continue Losartan 50 mg daily.  Type 2 diabetes mellitus He is currently followed by Endocrinology, Dr. Dwyane Dee, for his diabetes. He is taking Metformin XR 1500 mg daily, Jardiance 25 mg daily, Ozempic 0.5 mg weekly, and Amaryl 1 mg daily. A/P: Last A1c was 8.2. He continues to follow with Dr. Dwyane Dee for his diabetes which has had worsened control. - Continue current medications - consider increasing Metformin to 2000 mg daily if tolerating - f/u with Dr. Dwyane Dee as scheduled  Constipation He reports a recent episode of constipation without  a bowel movement for 4-5 days. He tried Miralax without benefit and subsequently used an OTC suppository which allowed him to have a bowel movement 2 days ago. He has been seen by GI in the past and thought to have IBS-C. He reports adequate fiber intake and continues to take dicyclomine.  A/P: He has chronic constipation. Will adjust bowel regimen to Miralax (polyethylene glycol) plus Senokot daily prn. Advised to increase fiber intake.  Health care maintenance Hepatitis C antibody negative.    Past Medical History:  Diagnosis Date  . Depression   . Diabetes mellitus without complication (Jack Wallace)   . Hyperlipidemia   . Hypertension   . Ischemic colitis ([place])    Review of Systems:   Review of Systems  Respiratory: Negative for shortness of breath.   Cardiovascular: Negative for chest pain and leg swelling.  Gastrointestinal: Positive for constipation. Negative for abdominal pain, diarrhea, nausea and vomiting.  Psychiatric/Behavioral: Positive for depression. Negative for suicidal ideas.   Physical Exam:  Vitals:   02/23/18 1315  BP: 133/84  Pulse: 78  Temp: 97.9 F (36.6 C)  TempSrc: Oral  SpO2: 97%  Weight: (!) 304 lb 6.4 oz (138.1 kg)  Height: 6' (1.829 m)   Physical Exam  Constitutional: He is oriented to person, place, and time. He appears well-developed and well-nourished. No distress.  Obese  Cardiovascular: Normal rate and regular rhythm.  No murmur heard. Pulmonary/Chest: Effort normal. No respiratory distress. He has no wheezes. He has no rales.  Abdominal: Soft. Bowel sounds are normal. He exhibits no distension. There is no tenderness. There is  no guarding.  Obese abdomen  Musculoskeletal: He exhibits no edema or tenderness.  Neurological: He is alert and oriented to person, place, and time.  Skin: He is not diaphoretic.  Psychiatric: He has a normal mood and affect.    Assessment & Plan:   See Encounters Tab for problem based charting.  Patient  discussed with Dr. Lynnae January

## 2018-02-23 NOTE — Assessment & Plan Note (Addendum)
He is currently taking Losartan 50 mg daily. BP on arrival is 133/84. BP Readings from Last 3 Encounters:  02/23/18 133/84  01/17/18 129/87  09/24/17 134/84  A/P: His blood pressure is controlled near goal of <130/80. His Jardiance was recently increased which can also effect blood pressure. Will continue Losartan 50 mg daily.

## 2018-02-23 NOTE — Patient Instructions (Signed)
It was a pleasure to see you again Jack Wallace.  We will add Senokot-S as needed for constipation. You can take this with miralax. Please continue with a high fiber diet for digestion.  We will continue your other medications as prescribed.  We are checking blood work to test for hepatitis C. If this is negative then there is no further workup. If positive, we will do further workup as discussed.  Please follow up with [place] in 4 months or sooner if needed.

## 2018-02-23 NOTE — Assessment & Plan Note (Addendum)
He is currently followed by Endocrinology, Dr. Dwyane Dee, for his diabetes. He is taking Metformin XR 1500 mg daily, Jardiance 25 mg daily, Ozempic 0.5 mg weekly, and Amaryl 1 mg daily. A/P: Last A1c was 8.2. He continues to follow with Dr. Dwyane Dee for his diabetes which has had worsened control. - Continue current medications - consider increasing Metformin to 2000 mg daily if tolerating - f/u with Dr. Dwyane Dee as scheduled

## 2018-02-24 ENCOUNTER — Other Ambulatory Visit (INDEPENDENT_AMBULATORY_CARE_PROVIDER_SITE_OTHER): Payer: BLUE CROSS/BLUE SHIELD

## 2018-02-24 DIAGNOSIS — E1142 Type 2 diabetes mellitus with diabetic polyneuropathy: Secondary | ICD-10-CM

## 2018-02-24 LAB — HEPATITIS C ANTIBODY: Hep C Virus Ab: <0.1 s/co ratio (ref 0.0–0.9)

## 2018-02-24 LAB — COMPREHENSIVE METABOLIC PANEL
ALBUMIN: 4.4 g/dL (ref 3.5–5.2)
ALT: 14 U/L (ref 0–53)
AST: 14 U/L (ref 0–37)
Alkaline Phosphatase: 56 U/L (ref 39–117)
BUN: 20 mg/dL (ref 6–23)
CHLORIDE: 103 meq/L (ref 96–112)
CO2: 30 mEq/L (ref 19–32)
Calcium: 10.4 mg/dL (ref 8.4–10.5)
Creatinine, Ser: 0.97 mg/dL (ref 0.40–1.50)
GFR: 84.65 mL/min (ref >60.00)
Glucose, Bld: 91 mg/dL (ref 70–99)
POTASSIUM: 4.1 meq/L (ref 3.5–5.1)
SODIUM: 141 meq/L (ref 135–145)
Total Bilirubin: 0.4 mg/dL (ref 0.2–1.2)
Total Protein: 7.5 g/dL (ref 6.0–8.3)

## 2018-02-24 LAB — LIPID PANEL
CHOL/HDL RATIO: 5
Cholesterol: 167 mg/dL (ref 0–200)
HDL: 36.6 mg/dL — AB (ref >39.00)
NonHDL: 130.48
Triglycerides: 215 mg/dL — ABNORMAL HIGH (ref 0.0–149.0)
VLDL: 43 mg/dL — ABNORMAL HIGH (ref 0.0–40.0)

## 2018-02-24 LAB — LDL CHOLESTEROL, DIRECT: LDL DIRECT: 103 mg/dL

## 2018-02-24 NOTE — Assessment & Plan Note (Signed)
Hepatitis C antibody negative.

## 2018-02-24 NOTE — Assessment & Plan Note (Signed)
He reports a recent episode of constipation without a bowel movement for 4-5 days. He tried Miralax without benefit and subsequently used an OTC suppository which allowed him to have a bowel movement 2 days ago. He has been seen by GI in the past and thought to have IBS-C. He reports adequate fiber intake and continues to take dicyclomine.  A/P: He has chronic constipation. Will adjust bowel regimen to Miralax (polyethylene glycol) plus Senokot daily prn. Advised to increase fiber intake.

## 2018-02-25 LAB — FRUCTOSAMINE: FRUCTOSAMINE: 294 umol/L — AB (ref 0–285)

## 2018-02-25 NOTE — Progress Notes (Signed)
Internal Medicine Clinic Attending  Case discussed with Dr. Patel at the time of the visit.  We reviewed the resident's history and exam and pertinent patient test results.  I agree with the assessment, diagnosis, and plan of care documented in the resident's note.  

## 2018-02-28 ENCOUNTER — Other Ambulatory Visit: Payer: Self-pay | Admitting: Endocrinology

## 2018-02-28 NOTE — Progress Notes (Signed)
Patient ID: Jack Wallace, male   DOB: January 30, 1961, 57 y.o.   MRN: [id number]   Reason for Appointment : Followup of Type 2 Diabetes   History of Present Illness            Diagnosis: Type 2 diabetes mellitus, date of diagnosis: 1993         Past diabetes history: He has been treated with various drugs for his diabetes over the last several years. He has been taking metformin for at least 10 years and has been tolerating this. Over the years he has had additional medications to improve his control. He was also taking Actos previously and not clear if he had any side effects. This was stopped because of fear of long-term effects  In early 2014 he had poor control before he started walking and lost 15 pounds. Subsequently he was getting low blood sugar during the day especially before lunch and sometimes in the afternoon His glipizide was  reduced but even with 5 mg twice a day he was getting hypoglycemia.  This was reduced and Invokana added. Invokana was stopped because of lack of clear benefit in 09/2013 Because of inadequate control he was started on Victoza on his visit in 10/14 in addition to his metformin and glipizide.  Recent history:   Non-insulin hypoglycemic drugs: Metformin Er 1500 mg daily,   Jardiance 25 pm, Amaryl 1 mg hs, Ozempic 0.5 mg weekly  His fructosamine is now 294, previously A1c was 8.2  In 12/2017 he was started on Ozempic instead of Victoza  Current problems, blood sugar patterns and management:  He was able to continue increasing his Ozempic after starting this about 6 weeks ago and is tolerating 0.5 mg weekly, has taken 2 shots  Although he had gained weight previously he has now lost about 8 pounds  He has seen the dietitian  Also starting to work with some swimming exercises at the [facility] 3 days a week  He takes his Amaryl when he is going to bed in the afternoon  Jardiance was increased to 25 mg and he has no side effects with this, taking  this when he wakes up  He is working night shifts  His blood sugars are mostly being checked while at work and this is variable times after eating when he gets to work, usually 3-5 hours later  However blood sugars are variable  Has a couple of readings when he wakes up in the evening and these are mildly increased  Monitors blood glucose:  1 times a day        Glucometer:  Contour  Blood Glucose readings from meter download:  Mean values apply above for all meters except median for One Touch  PRE-MEAL  overnight Lunch Dinner  evening Overall  Glucose range:  126-203      Mean/median:  171    160 165    Hypoglycemia : None    Dietician visit: Most recent: 3/19  Wt Readings from Last 3 Encounters:  03/01/18 (!) 300 lb 6.4 oz (136.3 kg)  02/23/18 (!) 304 lb 6.4 oz (138.1 kg)  02/18/18 (!) 305 lb (138.3 kg)      Physical activity: exercise: Swims 3/7         Lab Results  Component Value Date   HGBA1C 8.2 01/17/2018   HGBA1C 7.8 (H) 09/22/2017   HGBA1C 6.6 03/17/2017   Lab Results  Component Value Date   MICROALBUR 2.4 (H) 06/24/2017  LDLCALC 90 03/17/2017   CREATININE 0.97 02/24/2018   Lab Results  Component Value Date   FRUCTOSAMINE 294 (H) 02/24/2018   FRUCTOSAMINE 314 (H) 11/25/2017   FRUCTOSAMINE 281 12/02/2016    OTHER active problems: See review of systems  Allergies as of 03/01/2018   No Known Allergies     Medication List        Accurate as of 03/01/18  8:22 AM. Always use your most recent med list.          Alpha Lipoic Acid 200 MG Caps Take 200 mg by mouth. 2 pills daily   aspirin 81 MG tablet Take 81 mg by mouth daily.   BAYER MICROLET LANCETS lancets Use to check blood sugar two times per day   BIOTENE ORALBALANCE DRY MOUTH Gel Apply 1/2 inch length onto tongue and spread evenly repeat as often as needed Generic okay if available   CONTOUR NEXT EZ MONITOR w/Device Kit Use to check blood sugar two times per day   CONTOUR NEXT  TEST test strip Generic drug:  glucose blood TEST twice a day   dicyclomine 10 MG capsule Commonly known as:  BENTYL Take 1 capsule (10 mg total) by mouth 3 (three) times daily before meals.   empagliflozin 25 MG Tabs tablet Commonly known as:  JARDIANCE Take 25 mg by mouth daily.   gabapentin 600 MG tablet Commonly known as:  NEURONTIN take 1 tablet by mouth three times a day   glimepiride 1 MG tablet Commonly known as:  AMARYL take 1 tablet by mouth at bedtime   losartan 50 MG tablet Commonly known as:  COZAAR Take 1 tablet (50 mg total) by mouth daily.   Magnesium Oxide 400 (240 Mg) MG Tabs Take 1 tablet by mouth 3 (three) times daily. Take 1 in the morning and 2 in the evening.   metFORMIN 500 MG 24 hr tablet Commonly known as:  GLUCOPHAGE-XR take 4 tablets by mouth daily WITH SUPPER   multivitamin with minerals Tabs tablet Take 1 tablet by mouth daily.   NOVOTWIST 32G X 5 MM Misc Generic drug:  Insulin Pen Needle use daily   ondansetron 8 MG disintegrating tablet Commonly known as:  ZOFRAN-ODT take 1 tablet by mouth every 8 hours if needed for nausea and vomiting   pantoprazole 40 MG tablet Commonly known as:  PROTONIX take 1 tablet by mouth twice a day   polyethylene glycol powder powder Commonly known as:  GLYCOLAX/MIRALAX Mix 17 grams in 8 oz of water 1-3 x daily to titrate   pravastatin 40 MG tablet Commonly known as:  PRAVACHOL take 1 tablet by mouth once daily   Semaglutide 0.25 or 0.5 MG/DOSE Sopn Commonly known as:  OZEMPIC Inject 0.5 mg into the skin once a week.   senna-docusate 8.6-50 MG tablet Commonly known as:  Senokot-S Take 1 tablet by mouth daily as needed for mild constipation.   sertraline 100 MG tablet Commonly known as:  ZOLOFT take 2 tablets by mouth once daily   vitamin B-12 500 MCG tablet Commonly known as:  CYANOCOBALAMIN Take 500 mcg by mouth daily.   vitamin C 1000 MG tablet Take 1,000 mg by mouth daily.     VITAMIN D PO Take 1 tablet by mouth daily. D3 1000 IU   VITAMIN E PO Take 1 capsule by mouth daily. 400 IU daily       Allergies: No Known Allergies  Past Medical History:  Diagnosis Date  . Depression   .  Diabetes mellitus without complication ([place])   . Hyperlipidemia   . Hypertension   . Ischemic colitis [facility])     Past Surgical History:  Procedure Laterality Date  . COLONOSCOPY  06/01/2014  . NASAL SEPTUM SURGERY      Family History  Problem Relation Age of Onset  . Hypertension Mother   . Stroke Father   . Diabetes Maternal Grandfather   . Colon cancer Neg Hx   . Throat cancer Neg Hx   . Prostate cancer Neg Hx   . Pancreatic cancer Neg Hx   . Heart disease Neg Hx   . Kidney disease Neg Hx   . Liver disease Neg Hx     Social History:  reports that  has never smoked. he has never used smokeless tobacco. He reports that he does not drink alcohol or use drugs.    Review of Systems   Nausea: This is less    HYPERTENSION: Taking 50 mg losartan, Followed by PCP Blood pressure appears somewhat better  Home BP ranges from 120-140/75-95    BP Readings from Last 3 Encounters:  03/01/18 132/84  02/23/18 133/84  01/17/18 129/87       Hyperlipidemia: taking pravastatin  40 mg for several years. LDL is slightly higher than target of 100   Lab Results  Component Value Date   CHOL 167 02/24/2018   HDL 36.60 (L) 02/24/2018   LDLCALC 90 03/17/2017   LDLDIRECT 103.0 02/24/2018   TRIG 215.0 (H) 02/24/2018   CHOLHDL 5 02/24/2018      NEUROPATHY He has had symptoms of neuropathy in feet.   He mostly had burning, Also has occasional tingling   He has had less symptoms recently  He  takes gabapentin 600 mg tid with generally good relief   Foot exam last: 8/17  Last eye exam 03/2017  Physical Examination:  BP 132/84   Pulse (!) 106   Ht 6' (1.829 m)   Wt (!) 300 lb 6.4 oz (136.3 kg)   SpO2 96%   BMI 40.74 kg/m    No pedal edema  present  ASSESSMENT/PLAN:   Diabetes type 2 with obesity:  See history of present illness for detailed discussion of current diabetes management, blood sugar patterns and problems identified  Fructosamine is indicating overall somewhat better controlled, previously A1c was 8.2 He is tolerating Ozempic well and has less nausea Also with his working on the diet, increasing Jardiance and starting exercising is starting to lose weight Blood sugars at home are not necessarily looking better compared to the previous visit although taking mostly randomly at work and not postprandially  Currently no changes made in his basic regimen except to change his Amaryl to 2 mg and take it on waking up instead of bedtime, if he starts getting low sugar he will cut it back to 1 mg again  He will follow-up with his PCP for neuropathy, consider neurology consultation or pain management  We will do short-term follow-up in 2 months  for A1c  Lipids: May improve further but consider increasing pravastatin   HYPERTENSION: Somewhat better now, continue 50 mg losartan    There are no Patient Instructions on file for this visit.       Elayne Snare 03/01/2018, 8:22 AM

## 2018-03-01 ENCOUNTER — Ambulatory Visit: Payer: BLUE CROSS/BLUE SHIELD | Admitting: Endocrinology

## 2018-03-01 ENCOUNTER — Encounter: Payer: Self-pay | Admitting: Endocrinology

## 2018-03-01 VITALS — BP 132/84 | HR 106 | Ht 72.0 in | Wt 300.4 lb

## 2018-03-01 DIAGNOSIS — E1165 Type 2 diabetes mellitus with hyperglycemia: Secondary | ICD-10-CM | POA: Diagnosis not present

## 2018-03-01 NOTE — Patient Instructions (Addendum)
GLIMEPERIDE 2 pills on waking up= 2mg   Check blood sugars on waking up  3/7  Also check blood sugars about 2 hours after a meal and do this after different meals by rotation  Recommended blood sugar levels on waking up is 90-130 and about 2 hours after meal is 130-160  Please bring your blood sugar monitor to each visit, thank you

## 2018-03-10 ENCOUNTER — Telehealth: Payer: Self-pay | Admitting: Endocrinology

## 2018-03-10 ENCOUNTER — Other Ambulatory Visit: Payer: Self-pay

## 2018-03-10 MED ORDER — GLIMEPIRIDE 1 MG PO TABS: ORAL_TABLET | ORAL | 3 refills | Status: DC

## 2018-03-10 NOTE — Telephone Encounter (Signed)
This has been done.

## 2018-03-10 NOTE — Telephone Encounter (Signed)
Patient's pharmacy has not received new Script for the upped dosage of Glimepride per Dr. Dwyane Dee at patient's last visit on 03/01/18. Please send new RX with the adjusted dosage of Glimepride to [facility] on [address] asap

## 2018-03-25 ENCOUNTER — Encounter: Payer: BLUE CROSS/BLUE SHIELD | Attending: Endocrinology | Admitting: Dietician

## 2018-03-25 DIAGNOSIS — E1142 Type 2 diabetes mellitus with diabetic polyneuropathy: Secondary | ICD-10-CM | POA: Insufficient documentation

## 2018-03-25 DIAGNOSIS — Z713 Dietary counseling and surveillance: Secondary | ICD-10-CM | POA: Insufficient documentation

## 2018-03-25 NOTE — Progress Notes (Signed)
Diabetes Self-Management Education  Visit Type: Follow-up  Appt. Start Time: 0905 Appt. End Time: 0940  03/25/2018  [name], identified by name and date of birth, is a 57 y.o. male with a diagnosis of Diabetes: Type 2. Other history includes neuropathy, hypomagnesemia, hyperlipidemia, HTN, ischemic colitis, depression, and vitamin D deficiency. Medications include:  Ozempic, Jardiance, Glimiperide, Metformin, vitamin D, vitamin C, vitamin B-12, vitamin E, MVI, magnesium. Labs 02/24/18  Cholesterol 167, HDL 36, LDL 103, Triglycerides 215., Fructosamine.    Patient lives alone.  He works for [employer] in a mostly sedentary role.  He is followed by a nurse practitioner there but has less ability to see her since he switched to 3rd shift.  He reports adjusting to 3rd shift and plans to stay there.  Since last visit with this RD about a month ago, he has lost 10 lbs, is eating more than once per day, 'less junk", sleeping a little more 6-8 hours per day, and has started going to the [facility] and using the pool 4 days per week.  His depression seems improved and he states he feels better most days after exercise.  The Glimpiride was increased and he states that the time change and dose increase have helped to decrease his blood sugar numbers at work.  He is getting to the mid 70's and is symptomatic and will eat a meal or snack when he feels these symptoms.  H complains that his left ankle hurts when something (such as his shoe) is pressing on this.  At last visit he stated that he lacked desire and motivation to change but made several changes in the last month.    Encouraged continued healthy choices.  Patient does not cook and this may be a main hinderence.  Appetite is low on Ozempic and concern that he is not getting adequate protein.  Discussed adding protein and decreasing carbs (not as much fruit) at work.  ASSESSMENT  Weight 295 lb (133.8 kg). Body mass index is 40.01 kg/m.  Diabetes  Self-Management Education - 03/25/18 1020      Visit Information   Visit Type  Follow-up      Initial Visit   Diabetes Type  Type 2    Are you currently following a meal plan?  No    Are you taking your medications as prescribed?  Yes      Psychosocial Assessment   Patient Belief/Attitude about Diabetes  Motivated to manage diabetes    Self-care barriers  Other (comment) 3rd shift, depression    Self-management support  Doctor's office    Other persons present  Patient    Patient Concerns  Nutrition/Meal planning;Glycemic Control;Weight Control;Support;Healthy Lifestyle    Special Needs  None    Preferred Learning Style  No preference indicated    Learning Readiness  Ready      Pre-Education Assessment   Patient understands the diabetes disease and treatment process.  Demonstrates understanding / competency    Patient understands incorporating nutritional management into lifestyle.  Needs Review    Patient undertands incorporating physical activity into lifestyle.  Demonstrates understanding / competency    Patient understands using medications safely.  Demonstrates understanding / competency    Patient understands monitoring blood glucose, interpreting and using results  Demonstrates understanding / competency    Patient understands prevention, detection, and treatment of acute complications.  Demonstrates understanding / competency    Patient understands prevention, detection, and treatment of chronic complications.  Demonstrates understanding / competency  Patient understands how to develop strategies to address psychosocial issues.  Needs Review    Patient understands how to develop strategies to promote health/change behavior.  Needs Review      Complications   How often do you check your blood sugar?  1-2 times/day    Fasting Blood glucose range (mg/dL)  130-179    Postprandial Blood glucose range (mg/dL)  70-129;130-179    Number of hypoglycemic episodes per month  0     Have you had a dilated eye exam in the past 12 months?  Yes    Have you had a dental exam in the past 12 months?  Yes    Are you checking your feet?  Yes    How many days per week are you checking your feet?  5      Dietary Intake   Breakfast  3 fruit cups (no added sugar), 1 banana 1 am    Snack (morning)  yogurt drink after [employer]  frozen meal, canned green beans, canned potatoes 10am-noon    Snack (afternoon)  occasional    Dinner  none    Snack (evening)  occasional    Beverage(s)  diet soda (rootbeer, orange, Mt. Dew. [place]), crystal lite, rare plain water but increased in the summer.      Exercise   Exercise Type  Moderate (swimming / aerobic walking)    How many days per week to you exercise?  4    How many minutes per day do you exercise?  45    Total minutes per week of exercise  180      Patient Education   Previous Diabetes Education  Yes (please comment) 1 month ago    Nutrition management   Meal options for control of blood glucose level and chronic complications.;Food label reading, portion sizes and measuring food.    Physical activity and exercise   Other (comment) encouraged continued    Medications  Reviewed patients medication for diabetes, action, purpose, timing of dose and side effects.    Acute complications  Other (comment) reviewed treatment of hypoglycemia    Chronic complications  Assessed and discussed foot care and prevention of foot problems;Retinopathy and reason for yearly dilated eye exams    Psychosocial adjustment  Role of stress on diabetes;Worked with patient to identify barriers to care and solutions    Personal strategies to promote health  Lifestyle issues that need to be addressed for better diabetes care      Individualized Goals (developed by patient)   Nutrition  General guidelines for healthy choices and portions discussed    Physical Activity  Exercise 3-5 times per week;45 minutes per day    Medications  take my medication as  prescribed    Monitoring   test my blood glucose as discussed    Problem Solving  meal planning    Reducing Risk  examine blood glucose patterns;increase portions of healthy fats;do foot checks daily    Health Coping  discuss diabetes with (comment) MD, RD, CDE      Patient Self-Evaluation of Goals - Patient rates self as meeting previously set goals (% of time)   Nutrition  50 - 75 %    Physical Activity  >75%    Medications  >75%    Monitoring  >75%    Problem Solving  >75%    Reducing Risk  50 - 75 %    Health Coping  50 - 75 %  Post-Education Assessment   Patient understands the diabetes disease and treatment process.  Demonstrates understanding / competency    Patient understands incorporating nutritional management into lifestyle.  Needs Review    Patient undertands incorporating physical activity into lifestyle.  Demonstrates understanding / competency    Patient understands using medications safely.  Demonstrates understanding / competency    Patient understands monitoring blood glucose, interpreting and using results  Demonstrates understanding / competency    Patient understands prevention, detection, and treatment of acute complications.  Demonstrates understanding / competency    Patient understands prevention, detection, and treatment of chronic complications.  Demonstrates understanding / competency    Patient understands how to develop strategies to address psychosocial issues.  Needs Review    Patient understands how to develop strategies to promote health/change behavior.  Needs Review      Outcomes   Expected Outcomes  Demonstrated interest in learning. Expect positive outcomes    Future DMSE  4-6 wks    Program Status  Completed      Subsequent Visit   Since your last visit have you experienced any weight changes?  Loss    Weight Loss (lbs)  10    Since your last visit, are you checking your blood glucose at least once a day?  Yes       Individualized Plan  for Diabetes Self-Management Training:   Learning Objective:  Patient will have a greater understanding of diabetes self-management. Patient education plan is to attend individual and/or group sessions per assessed needs and concerns.   Plan:   Patient Instructions  Recommend changing the 1 am meal to 1 sandwich ([place] and cheese on [employer]) Consider a lite meal or snack at 5am (cheese and crackers and fruit OR cottage cheese and fruit and 10 wheat thins  Consider cantaloupe or berries rather than fruit cup. Consider more water rather than diet drinks. Decrease fruit and increase protein.  Small amount of protein with each meal and snack. Consider reading the food labels for portion size.  Continue the great changes!  Eating more frequently.  Going to the [facility].  Eye appointment in 2 weeks.   Expected Outcomes:  Demonstrated interest in learning. Expect positive outcomes  Education material provided: Food label handouts and Snack sheet  If problems or questions, patient to contact team via:  Phone  Future DSME appointment: 4-6 wks

## 2018-03-25 NOTE — Patient Instructions (Addendum)
Recommend changing the 1 am meal to 1 sandwich ([place] and cheese on [employer]) Consider a lite meal or snack at 5am (cheese and crackers and fruit OR cottage cheese and fruit and 10 wheat thins  Consider cantaloupe or berries rather than fruit cup. Consider more water rather than diet drinks. Decrease fruit and increase protein.  Small amount of protein with each meal and snack. Consider reading the food labels for portion size.  Continue the great changes!  Eating more frequently.  Going to the [facility].  Eye appointment in 2 weeks.

## 2018-03-28 ENCOUNTER — Encounter: Payer: Self-pay | Admitting: Internal Medicine

## 2018-03-28 ENCOUNTER — Ambulatory Visit: Payer: BLUE CROSS/BLUE SHIELD | Admitting: Internal Medicine

## 2018-03-28 ENCOUNTER — Ambulatory Visit (HOSPITAL_COMMUNITY)
Admission: RE | Admit: 2018-03-28 | Discharge: 2018-03-28 | Disposition: A | Discharge status: Home / Self Care | Payer: BLUE CROSS/BLUE SHIELD | Source: Ambulatory Visit | Attending: Internal Medicine | Admitting: Internal Medicine

## 2018-03-28 DIAGNOSIS — M25472 Effusion, left ankle: Secondary | ICD-10-CM

## 2018-03-28 DIAGNOSIS — M25572 Pain in left ankle and joints of left foot: Secondary | ICD-10-CM

## 2018-03-28 DIAGNOSIS — R2242 Localized swelling, mass and lump, left lower limb: Secondary | ICD-10-CM | POA: Diagnosis not present

## 2018-03-28 DIAGNOSIS — M7732 Calcaneal spur, left foot: Secondary | ICD-10-CM | POA: Insufficient documentation

## 2018-03-28 DIAGNOSIS — M79672 Pain in left foot: Secondary | ICD-10-CM | POA: Diagnosis not present

## 2018-03-28 DIAGNOSIS — M7989 Other specified soft tissue disorders: Secondary | ICD-10-CM | POA: Diagnosis not present

## 2018-03-28 DIAGNOSIS — Q667 Congenital pes cavus: Secondary | ICD-10-CM | POA: Insufficient documentation

## 2018-03-28 MED ORDER — DICLOFENAC SODIUM 1 % TD GEL: 4.0000 g | Freq: Four times a day (QID) | TRANSDERMAL | 1 refills | Status: DC | PRN

## 2018-03-28 NOTE — Assessment & Plan Note (Signed)
Patient is here with complaint of left ankle swelling x 3 weeks. He endorses pain with ambulation. Denies history of injury or fall. Unclear precipitating cause. On exam he does have significant swelling over his lateral malleolus. POC Ultrasound reveals diffuse soft tissue swelling without evidence of effusion. Xrays were obtained today and negative for acute fracture. Unclear cause of swelling, it is possible this is a simple ankle sprain, but given the delay in healing will refer to sports medicine for further evaluation.  -- DME order for Ankle air brace for support -- Topical Voltaren gel PRN -- Sports medicine referral

## 2018-03-28 NOTE — Patient Instructions (Signed)
[name],  It was a pleasure to see you. I have ordered xrays of your ankle, I will call you later today or tomorrow with the results. Depending on what the xrays show, we may refer you to a sports medicine doctor for further evaluation and treatment. I have also ordered an ankle brace for you to help stabilize your ankle. If you have any questions or concerns, call our clinic at [phone] or after hours call [phone] and ask for the internal medicine resident on call. Thank you!

## 2018-03-28 NOTE — Progress Notes (Signed)
   CC: Left ankle pain / swelling  HPI:  [name] is a 57 y.o. male with past medical history outlined below here for left ankle pain and swelling. For the details of today's visit, please refer to the assessment and plan.  Past Medical History:  Diagnosis Date  . Depression   . Diabetes mellitus without complication ([place])   . Hyperlipidemia   . Hypertension   . Ischemic colitis ([place])     Review of Systems  Musculoskeletal: Negative for falls.       Left ankle pain    Physical Exam:  Vitals:   03/28/18 0836  BP: 112/78  Pulse: 93  Temp: 98.6 F (37 C)  TempSrc: Oral  SpO2: 96%  Weight: 296 lb (134.3 kg)    Constitutional: NAD, appears comfortable Pulmonary/Chest: Breathing comfortably on RA Extremities: Warm and well perfused. Left ankle with soft tissue swelling over the lateral malleolus  Skin: No rashes or erythema  Psychiatric: Normal mood and affect  Assessment & Plan:   See Encounters Tab for problem based charting.  Patient seen with Dr. Angelia Mould

## 2018-03-31 ENCOUNTER — Telehealth: Payer: Self-pay | Admitting: *Deleted

## 2018-03-31 NOTE — Progress Notes (Signed)
Internal Medicine Clinic Attending  I saw and evaluated the patient.  I personally confirmed the key portions of the history and exam documented by Dr. Philipp Ovens and I reviewed pertinent patient test results.  The assessment, diagnosis, and plan were formulated together and I agree with the documentation in the resident's note. POCUS revealed soft tissue swelling some mild fluid around peronus tendon, no joint effusion.  Overall suspect ankle sprain.

## 2018-03-31 NOTE — Telephone Encounter (Signed)
Information was sent to CoverMyMeds for PA for Diclofenac Gel 1%.  Approved starting 03/31/2018 thru 12/31/20139.  Sander Nephew, RN 03/31/2018 4:13 PM.

## 2018-04-01 ENCOUNTER — Encounter: Payer: Self-pay | Admitting: Family Medicine

## 2018-04-01 ENCOUNTER — Ambulatory Visit: Payer: BLUE CROSS/BLUE SHIELD | Admitting: Family Medicine

## 2018-04-01 VITALS — BP 129/92 | Ht 72.0 in | Wt 295.0 lb

## 2018-04-01 DIAGNOSIS — M25572 Pain in left ankle and joints of left foot: Secondary | ICD-10-CM | POA: Diagnosis not present

## 2018-04-01 NOTE — Assessment & Plan Note (Signed)
Due to his equinus varus at the ankle, he is putting quite a bit of extra strain on the lateral malleolus or and peroneal tendons.  I placed him in a scaphoid pad to account for his midfoot collapse and then placed a lateral wedge to offload the lateral portion of his foot.  In the other shoe, also on a green and so I placed a heel lift so he would be even on both sides but there is no wedge on the right shoe.  He was able to tolerate these walking around the clinic.  I will see him back in 2-3 weeks.  Also gave him some inversion eversion exercises and heel raises.

## 2018-04-01 NOTE — Progress Notes (Signed)
  Jack Wallace - 57 y.o. male MRN [id number]  Date of birth: 1961/04/05    SUBJECTIVE:      Chief Complaint:/ HPI:  Left ankle pain gradually worsening over the last 1-2 weeks.  Lateral portion of the ankle.  No specific injury.  He is not changed activities or shoes.  Pain is aggravating, 4-6 out of 10.  Not having any problems with the other foot.  No change in his work schedule.  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  No fever.  He has some baseline numbness in the right foot but this is unchanged.  PERTINENT  PMH / PSH FH / / SH:  Past Medical, Surgical, Social, and Family History Reviewed & Updated in the EMR.  Pertinent findings include:  Obesity Type 2 diabetes mellitus Hypertension  OBJECTIVE: BP (!) 129/92   Ht 6' (1.829 m)   Wt 295 lb (133.8 kg)   BMI 40.01 kg/m   Physical Exam:  Vital signs are reviewed. GENERAL: Well-developed overweight male no acute distress ANKLES: Full range of motion plantar flexion dorsiflexion, eversion and inversion.  Mild tenderness palpation over the lateral malleolarperoneal tibial tendon area.  He has equinus varus worse on the left than the right. GAIT: Antalgic.  On left foot plantar phase he has significant subtalar motion. ULTRASOUND: Brief ultrasound of the left ankle reveals no bony abnormality, no effusion.  ASSESSMENT & PLAN:  See problem based charting & AVS for pt instructions.

## 2018-04-02 ENCOUNTER — Other Ambulatory Visit: Payer: Self-pay | Admitting: Internal Medicine

## 2018-04-05 DIAGNOSIS — H5213 Myopia, bilateral: Secondary | ICD-10-CM | POA: Diagnosis not present

## 2018-04-05 DIAGNOSIS — H52203 Unspecified astigmatism, bilateral: Secondary | ICD-10-CM | POA: Diagnosis not present

## 2018-04-05 DIAGNOSIS — E119 Type 2 diabetes mellitus without complications: Secondary | ICD-10-CM | POA: Diagnosis not present

## 2018-04-05 DIAGNOSIS — H524 Presbyopia: Secondary | ICD-10-CM | POA: Diagnosis not present

## 2018-04-05 DIAGNOSIS — H2513 Age-related nuclear cataract, bilateral: Secondary | ICD-10-CM | POA: Diagnosis not present

## 2018-04-05 LAB — HM DIABETES EYE EXAM

## 2018-04-13 ENCOUNTER — Other Ambulatory Visit: Payer: Self-pay | Admitting: Endocrinology

## 2018-04-18 ENCOUNTER — Encounter: Payer: Self-pay | Admitting: Endocrinology

## 2018-04-29 ENCOUNTER — Other Ambulatory Visit: Payer: Self-pay | Admitting: Endocrinology

## 2018-04-29 ENCOUNTER — Ambulatory Visit: Payer: BLUE CROSS/BLUE SHIELD | Admitting: Family Medicine

## 2018-04-29 VITALS — BP 134/98 | Ht 72.0 in | Wt 292.0 lb

## 2018-04-29 DIAGNOSIS — M25572 Pain in left ankle and joints of left foot: Secondary | ICD-10-CM

## 2018-04-29 DIAGNOSIS — I10 Essential (primary) hypertension: Secondary | ICD-10-CM

## 2018-04-29 MED ORDER — DICLOFENAC SODIUM 75 MG PO TBEC: 75.0000 mg | DELAYED_RELEASE_TABLET | Freq: Two times a day (BID) | ORAL | 1 refills | Status: DC

## 2018-04-29 NOTE — Progress Notes (Signed)
     Date of Visit: 04/29/2018   HPI:  Jack Wallace is a 57 y.o. male here for follow-up of left ankle pain that started approximately 6 weeks ago and has not improved. He was seen on 4/12 and was given a scaphoid pad and lateral wedge for the left shoe and a heel lift for the right. He has been using these inserts in all shoes he has been using. He has also been performing the inversion/eversion and heel raise exercises daily. He tried using the voltaren gel and notes that while it reduced his pain, it felt "weird, as if [he] could not feel his ankle" causing him to avoid using it. Two weeks ago he started wearing an ankle stirrup brace that provides some He says that the severity, quality, and location of the pain is the same as when it started. At rest, he has 2/10 pain and when walking he reports it rises to 8/10. It is throbbing in quality and located in the distal fibular region. Occasionally it spreads superiorly to the anterolateral middle third of the fibular region. It is worse with increasing pressure, as when standing on that leg, walking, or going up stairs. At times it does feel unstable as if it may "give out," though it has never caused him to fall. He reports that he still notices some swelling in the area of pain, but it has gradually reduced over the past 6 weeks. He denies any bruising or skin changes in the ankle. He denies numbness and tingling in the ankle, forefoot, or toes.   ROS: See HPI.   PHYSICAL EXAM: BP (!) 134/98   Ht 6' (1.829 m)   Wt 292 lb (132.5 kg)   BMI 39.60 kg/m  Gen: well appearing, NAD Left ankle: No gross deformity of the left ankle, no apparent swelling of the lateral ankle. Maximal tenderness to palpation is at the tip of the lateral malleolus. Moderate tenderness inferior to the lateral malleolus in the region of the peroneal tendons. Pain with palpation 2-3/10.Full ROM with dorsiflexion/plantarflexion, internal/external rotation, inversion/eversion.  Mild pain with active and passive plantar flexion and inversion. No pain with resisted plantarflexion. No laxity with talar tilt or anterior drawer.  VASC 2+ B= DP puulses SKIN: no rash   ASSESSMENT/PLAN:  1. Left Lateral Ankle Pain: Patient has persistent pain at the left lateral malleolus and has noted minimal improvement with lateral wedge and scaphoid pad or with ankle exercises. While the voltaren seemed to help with the pain, he was not tolerant of how it felt. Reviewed plain films from 4/08 with no signs of fracture. However, patient may benefit from immobilization given maximal tenderness directly over tip of lateral malleolus and lack of improvement with other conservative methods thus far. Placed patient in a left ankle air cast today. Given that the votaren gel did provide some relief, will see if patient tolerates po voltaren. Patient will continue to use inserts and perform exercises. Will see patient back in two weeks to assess for improvement. He wants to avoid extra expense of additional imaging if possible.   FOLLOW UP: Follow up in 2 weeks for follow-up of left ankle pain.   Reinaldo Raddle, Medical Student

## 2018-04-29 NOTE — Patient Instructions (Addendum)
Let us try adding the Aircast for some additional immobilization.  I am also calling in some diclofenac tablets.  Take 1 twice a day with food.  Stop if you have any stomach upset or other problems.  Let me see you back in a couple of weeks.  Please call in the interim with any problems.

## 2018-05-02 NOTE — Progress Notes (Signed)
[facility] Attending Note: I have seen and examined this patient with the medical student. I have  reviewed the history, physical examination, assessment and plan as documented in the medical student's note.  I agree with the medical student's note and findings, assessment and treatment plan as documented with the following additions or changes:

## 2018-05-03 ENCOUNTER — Other Ambulatory Visit: Payer: Self-pay | Admitting: Endocrinology

## 2018-05-06 ENCOUNTER — Other Ambulatory Visit (INDEPENDENT_AMBULATORY_CARE_PROVIDER_SITE_OTHER): Payer: BLUE CROSS/BLUE SHIELD

## 2018-05-06 ENCOUNTER — Encounter: Payer: BLUE CROSS/BLUE SHIELD | Attending: Endocrinology | Admitting: Dietician

## 2018-05-06 DIAGNOSIS — E1142 Type 2 diabetes mellitus with diabetic polyneuropathy: Secondary | ICD-10-CM

## 2018-05-06 DIAGNOSIS — E1165 Type 2 diabetes mellitus with hyperglycemia: Secondary | ICD-10-CM

## 2018-05-06 DIAGNOSIS — Z713 Dietary counseling and surveillance: Secondary | ICD-10-CM | POA: Insufficient documentation

## 2018-05-06 LAB — BASIC METABOLIC PANEL
BUN: 20 mg/dL (ref 6–23)
CALCIUM: 9.7 mg/dL (ref 8.4–10.5)
CHLORIDE: 101 meq/L (ref 96–112)
CO2: 28 meq/L (ref 19–32)
CREATININE: 1.14 mg/dL (ref 0.40–1.50)
GFR: 70.21 mL/min (ref >60.00)
Glucose, Bld: 80 mg/dL (ref 70–99)
Potassium: 4.9 mEq/L (ref 3.5–5.1)
Sodium: 137 mEq/L (ref 135–145)

## 2018-05-06 LAB — HEMOGLOBIN A1C: HEMOGLOBIN A1C: 6.9 % — AB (ref 4.6–6.5)

## 2018-05-06 NOTE — Progress Notes (Signed)
  Medical Nutrition Therapy:  Appt start time: 0915 end time:  0930.   Assessment:  Primary concerns today: Patient is here today alone for follow up of type 2 diabetes.  History includes neuropathy, hypomagnesemia, hyperlipidemia, HTN, ischemic colitis, depression, and vitamin D deficiency. Medications include: Ozempic, Jardiance, Glimiperide, Metformin, vitamin D, vitamin C, vitamin B-12, vitamin E, MVI, magnesium  Patient lives alone.  He works for [employer] in a mostly sedentary role but states that he is up and down a lot.  He is followed by a nurse practitioner at work but since switching to 3rd shift he does not see her often. Today he is having increased depression.  He denied any desire to harm himself.  He has had medical care for his ankle and is wearing a brace.  His depression is worse.  He is not ready for any changes.  Could not think of any hobby.  Did state that he liked 70's music.  H is not allowed a dog where he lives.  He continues the [employer] but has no motivation to go to the pool and is unable to draw this motivation from anywhere.  I asked if he could change the fruit cup to fresh fruit and he said no.  This happened with every suggestion.  Discussed taking one portion of potato chips rather than the whole bag.  He is currently resistant to this suggestion.  He has regained 5 lbs since las visit 1 month ago. He states that his Blood sugar was 90 today.  He states that he can afford his medication and is taking it.  Weight 300 lbs today.  Preferred Learning Style:   No preference indicated   Learning Readiness:   Not ready   DIETARY INTAKE: Usual eating pattern includes 2 meals and 2 snacks per day. Everyday foods include potato chips.   24-hr recall:  B ( AM): fruit  Snk ( AM):   L ( PM): frozen meal, canned potatoes, canned green beans Snk ( PM): fruit cup, potato chips D ( PM): skips Snk ( PM): occasional Beverages: crystal lite, water, diet  soda   Usual physical activity: none  Progress Towards Goal(s):  In progress.   Nutritional Diagnosis:  [place]-3.3 Overweight/obesity As related to inactivity, food choices.  As evidenced by diet hx.    Intervention:  Nutrition counseling/education continued related to type 2 diabetes and food choices.  Encouraged him to discuss the depression with his MD.  This is playing a large role in his self care.  He did get his eyes examined a couple of weeks ago.  Worked to find any motivational factor without success.  Find something to give you joy. Consider when you can start going to the pool again. Continue to take your medications. Continue to check your blood sugar. Consider fresh fruit rather than canned more often. Please call for any needs.  Teaching Method Utilized:  Visual Auditory  Barriers to learning/adherence to lifestyle change: depression, lack of hobbies and other things that could give him a reason to change, he does not cook.  Demonstrated degree of understanding via:  Teach Back   Monitoring/Evaluation:  Dietary intake, exercise, and body weight in 2 month(s).

## 2018-05-06 NOTE — Patient Instructions (Signed)
Find something to give you joy. Consider when you can start going to the pool again. Continue to take your medications. Continue to check your blood sugar. Consider fresh fruit rather than canned more often. Please call for any needs.

## 2018-05-09 NOTE — Progress Notes (Signed)
Patient ID: Jack Wallace, male   DOB: [birth year], 57 y.o.   MRN: [id number]   Reason for Appointment : Followup of Type 2 Diabetes   History of Present Illness            Diagnosis: Type 2 diabetes mellitus, date of diagnosis: 1993         Past diabetes history: He has been treated with various drugs for his diabetes over the last several years. He has been taking metformin for at least 10 years and has been tolerating this. Over the years he has had additional medications to improve his control. He was also taking Actos previously and not clear if he had any side effects. This was stopped because of fear of long-term effects  In early 2014 he had poor control before he started walking and lost 15 pounds. Subsequently he was getting low blood sugar during the day especially before lunch and sometimes in the afternoon His glipizide was  reduced but even with 5 mg twice a day he was getting hypoglycemia.  This was reduced and Invokana added. Invokana was stopped because of lack of clear benefit in 09/2013 Because of inadequate control he was started on Victoza on his visit in 10/14 in addition to his metformin and glipizide.  Recent history:   Non-insulin hypoglycemic drugs: Metformin Er 1500 mg daily,   Jardiance 25 pm, Amaryl 1 mg hs, Ozempic 0.5 mg weekly  His fructosamine is now 294, previously A1c was 8.2  In 12/2017 he was started on Ozempic instead of Victoza  Current problems, blood sugar patterns and management:  He appears to be getting consistent benefit from continuing [place] that was started prior to his last visit  He has seen the dietitian in follow-up  Although much improved blood sugars with the above changes he has not lost any weight  He does check his sugars fairly regularly but mostly when he comes back from work in the morning and occasionally late morning  Has only one high reading of 192 after his lunch at work  He thinks he feels hypoglycemic  and week if his blood sugars are below 100 but has only occasional readings like this  His fasting glucose in the lab was 80  His Amaryl was changed the morning when he wakes up instead of when he would come back from work  Because of his not being able to walk or exercise his weight is about the same, still having ankle pain  Monitors blood glucose:  1 times a day        Glucometer:  Contour  Blood Glucose readings from meter download:  Mean values apply above for all meters except median for One Touch  PRE-MEAL Fasting Lunch Dinner Bedtime Overall  Glucose range: ?    94-137   Mean/median:      120   POST-MEAL PC Breakfast PC Lunch PC Dinner  Glucose range:  103-137  122-192   Mean/median:      Previous readings:  Mean values apply above for all meters except median for One Touch  PRE-MEAL  overnight Lunch Dinner  evening Overall  Glucose range:  126-203      Mean/median:  171    160 165    Hypoglycemia : None    Dietician visit: Most recent: 5/19  Wt Readings from Last 3 Encounters:  05/10/18 299 lb 3.2 oz (135.7 kg)  04/29/18 292 lb (132.5 kg)  04/01/18 295 lb (133.8 kg)  Physical activity: exercise: Swims 0/7         Lab Results  Component Value Date   HGBA1C 6.9 (H) 05/06/2018   HGBA1C 8.2 01/17/2018   HGBA1C 7.8 (H) 09/22/2017   Lab Results  Component Value Date   MICROALBUR 2.4 (H) 06/24/2017   LDLCALC 90 03/17/2017   CREATININE 1.14 05/06/2018   Lab Results  Component Value Date   FRUCTOSAMINE 294 (H) 02/24/2018   FRUCTOSAMINE 314 (H) 11/25/2017   FRUCTOSAMINE 281 12/02/2016    OTHER active problems: See review of systems   Allergies as of 05/10/2018   No Known Allergies     Medication List        Accurate as of 05/10/18  9:05 AM. Always use your most recent med list.          Alpha Lipoic Acid 200 MG Caps Take 200 mg by mouth. 2 pills daily   aspirin 81 MG tablet Take 81 mg by mouth daily.   BAYER MICROLET LANCETS  lancets Use to check blood sugar two times per day   BIOTENE ORALBALANCE DRY MOUTH Gel Apply 1/2 inch length onto tongue and spread evenly repeat as often as needed Generic okay if available   CONTOUR NEXT EZ MONITOR w/Device Kit Use to check blood sugar two times per day   CONTOUR NEXT TEST test strip Generic drug:  glucose blood TEST twice a day   diclofenac sodium 1 % Gel Commonly known as:  VOLTAREN Apply 4 g topically 4 (four) times daily as needed.   dicyclomine 10 MG capsule Commonly known as:  BENTYL Take 1 capsule (10 mg total) by mouth 3 (three) times daily before meals.   empagliflozin 25 MG Tabs tablet Commonly known as:  JARDIANCE Take 25 mg by mouth daily.   gabapentin 600 MG tablet Commonly known as:  NEURONTIN TAKE 1 TABLET BY MOUTH THREE TIMES A DAY   glimepiride 1 MG tablet Commonly known as:  AMARYL Take 2 tablets at bedtime   losartan 100 MG tablet Commonly known as:  COZAAR Take 1 tablet (100 mg total) by mouth daily.   Magnesium Oxide 400 (240 Mg) MG Tabs Take 1 tablet by mouth 3 (three) times daily. Take 1 in the morning and 2 in the evening.   metFORMIN 500 MG 24 hr tablet Commonly known as:  GLUCOPHAGE-XR TAKE 4 TABLETS BY MOUTH ONCE DAILY WITH SUPPER   multivitamin with minerals Tabs tablet Take 1 tablet by mouth daily.   NOVOTWIST 32G X 5 MM Misc Generic drug:  Insulin Pen Needle use daily   ondansetron 8 MG disintegrating tablet Commonly known as:  ZOFRAN-ODT take 1 tablet by mouth every 8 hours if needed for nausea and vomiting   OZEMPIC 0.25 or 0.5 MG/DOSE Sopn Generic drug:  Semaglutide INJECT 0.5 MILLIGRAMS SUBCUTANEOUSLY EVERY WEEK   pantoprazole 40 MG tablet Commonly known as:  PROTONIX TAKE 1 TABLET BY MOUTH TWICE A DAY   pravastatin 40 MG tablet Commonly known as:  PRAVACHOL take 1 tablet by mouth once daily   sertraline 100 MG tablet Commonly known as:  ZOLOFT take 2 tablets by mouth once daily   vitamin B-12  500 MCG tablet Commonly known as:  CYANOCOBALAMIN Take 500 mcg by mouth daily.   vitamin C 1000 MG tablet Take 1,000 mg by mouth daily.   VITAMIN D PO Take 1 tablet by mouth daily. D3 1000 IU   VITAMIN E PO Take 1 capsule by mouth daily. 400 IU  daily       Allergies: No Known Allergies  Past Medical History:  Diagnosis Date  . Depression   . Diabetes mellitus without complication ([place])   . Hyperlipidemia   . Hypertension   . Ischemic colitis [facility])     Past Surgical History:  Procedure Laterality Date  . COLONOSCOPY  06/01/2014  . NASAL SEPTUM SURGERY      Family History  Problem Relation Age of Onset  . Hypertension Mother   . Stroke Father   . Diabetes Maternal Grandfather   . Colon cancer Neg Hx   . Throat cancer Neg Hx   . Prostate cancer Neg Hx   . Pancreatic cancer Neg Hx   . Heart disease Neg Hx   . Kidney disease Neg Hx   . Liver disease Neg Hx     Social History:  reports that he has never smoked. He has never used smokeless tobacco. He reports that he does not drink alcohol or use drugs.    Review of Systems   Nausea: This is less    HYPERTENSION: Taking 50 mg losartan  Blood pressure appears mostly high  Home BP ranges from 120-140/ 84-88, uses arm cuff    BP Readings from Last 3 Encounters:  05/10/18 134/88  04/29/18 (!) 134/98  04/01/18 (!) 129/92       Hyperlipidemia: taking pravastatin  40 mg for several years. LDL is slightly higher than target of 100   Lab Results  Component Value Date   CHOL 167 02/24/2018   HDL 36.60 (L) 02/24/2018   LDLCALC 90 03/17/2017   LDLDIRECT 103.0 02/24/2018   TRIG 215.0 (H) 02/24/2018   CHOLHDL 5 02/24/2018      NEUROPATHY He has had symptoms of neuropathy in feet.   He had burning, has occasional tingling   He has had less symptoms now  He  takes gabapentin 600 mg tid with generally good relief   Foot exam last: 8/17  Last eye exam 03/2017  Physical Examination:  BP 134/88 (BP  Location: Left Arm, Patient Position: Sitting, Cuff Size: Large)   Pulse 84   Ht 6' (1.829 m)   Wt 299 lb 3.2 oz (135.7 kg)   SpO2 97%   BMI 40.58 kg/m      ASSESSMENT/PLAN:   Diabetes type 2 with obesity:  See history of present illness for detailed discussion of current diabetes management, blood sugar patterns and problems identified  His A1c is significantly better at 6.9 compared to 8.2  This is likely from his improved diet overall, Ozempic, increase Jardiance However has not lost weight He can do better with glucose monitoring with more readings after his main meal in the evening as he is checking mostly around his bedtime  He has not been able to exercise recently and this has probably hindered his weight loss We discussed that if he start exercising his blood sugars are low normal he can reduce Amaryl to half tablet  Lipids: May improve further with diet and will need repetition on the next visit   HYPERTENSION: Somewhat higher blood pressure now, currently on 50 mg losartan and will go up to 100 mg    Patient Instructions  When active cut Amaryl in 1/2  Check blood sugars on waking up 2-3/7   Also check blood sugars about 2 hours after a meal and do this after different meals by rotation  Recommended blood sugar levels on waking up is 90-130 and about 2 hours after  meal is 130-160  Please bring your blood sugar monitor to each visit, thank you         Elayne Snare 05/10/2018, 9:05 AM

## 2018-05-10 ENCOUNTER — Ambulatory Visit: Payer: BLUE CROSS/BLUE SHIELD | Admitting: Endocrinology

## 2018-05-10 ENCOUNTER — Encounter: Payer: Self-pay | Admitting: Endocrinology

## 2018-05-10 VITALS — BP 134/88 | HR 84 | Ht 72.0 in | Wt 299.2 lb

## 2018-05-10 DIAGNOSIS — E1165 Type 2 diabetes mellitus with hyperglycemia: Secondary | ICD-10-CM | POA: Diagnosis not present

## 2018-05-10 DIAGNOSIS — E7800 Pure hypercholesterolemia, unspecified: Secondary | ICD-10-CM | POA: Diagnosis not present

## 2018-05-10 DIAGNOSIS — I10 Essential (primary) hypertension: Secondary | ICD-10-CM | POA: Diagnosis not present

## 2018-05-10 MED ORDER — LOSARTAN POTASSIUM 100 MG PO TABS: 100.0000 mg | ORAL_TABLET | Freq: Every day | ORAL | 3 refills | Status: DC

## 2018-05-10 NOTE — Patient Instructions (Signed)
When active cut Amaryl in 1/2  Check blood sugars on waking up 2-3/7   Also check blood sugars about 2 hours after a meal and do this after different meals by rotation  Recommended blood sugar levels on waking up is 90-130 and about 2 hours after meal is 130-160  Please bring your blood sugar monitor to each visit, thank you

## 2018-05-11 ENCOUNTER — Other Ambulatory Visit: Payer: Self-pay | Admitting: Endocrinology

## 2018-05-13 ENCOUNTER — Ambulatory Visit: Payer: BLUE CROSS/BLUE SHIELD | Admitting: Family Medicine

## 2018-05-13 ENCOUNTER — Encounter: Payer: Self-pay | Admitting: Family Medicine

## 2018-05-13 VITALS — BP 125/82 | Ht 72.0 in | Wt 295.0 lb

## 2018-05-13 DIAGNOSIS — M25572 Pain in left ankle and joints of left foot: Secondary | ICD-10-CM | POA: Diagnosis not present

## 2018-05-13 NOTE — Progress Notes (Signed)
     Date of Visit: 05/02/2018   HPI:  Jack Wallace presents today for follow-up of his left ankle pain. He reports that the pain is significantly improved. At his visit two weeks ago, he was given an aircast, which he has been wearing daily. He reports 0/10 pain at rest and 2/10 pain with extended walking, still located at the tip of the lateral malleolus. He says that the pain is burning in quality and it does not radiate. He has tried walking without the aircast at home and reports very little pain. He has been tolerating the po voltaren without side effects. He reports that he is no longer struggling with pain at work. He denies any swelling or other skin changes. He denies numbness and tingling.   ROS: See HPI.   PHYSICAL EXAM: BP 108/74   Ht 5\' 7"  (1.702 m)   Wt 157 lb (71.2 kg)   BMI 24.59 kg/m  Gen: well-appearing, no acute distress Heart: warm, well-perfused Lungs: non-labored breathing  Left ankle: No visible erythema or swelling. No pain at base of 5th MT; No tenderness over cuboid. No tenderness over navicular prominence. No tenderness on posterior aspects of lateral and medial malleolus. Mild tenderness over the tip of the lateral malleolus. Range of motion is full in all directions. Strength is 5/5 in all directions. No laxity with talar tilt or anterior drawer; squeeze test unremarkable. No pain with standing on toes. NVI distally.   ASSESSMENT/PLAN: Left ankle pain: Patient presents for follow-up of left ankle pain and reports significant improvement. Clinical suspicion is highest for subtalar joint pathology causing excess movement and subsequent strain of lateral supporting structures;, though etiology remains unknown. He is hesitant to discontinue the aircast and plans to continue to wear it while at work. He may remove the aircast as tolerated given his improvement. Recommended that if the pain returns he should wear the aircast for one consecutive week and  return to clinic if it persists. He will continue po voltaren for 1 week and may use as needed thereafter. He may engage in activity as tolerated. Follow-up as needed.   FOLLOW UP: prn Follow up as needed if left ankle pain worsens or fails to improve.   Reinaldo Raddle, [place] Attending Note: I have seen and examined this patient with the medical student. I have  reviewed the history, physical examination, assessment and plan as documented in the medical student's note.  I agree with the medical student's note and findings, assessment and treatment plan as documented with the additions or changes I have made to the note.:

## 2018-05-24 ENCOUNTER — Other Ambulatory Visit: Payer: Self-pay | Admitting: Endocrinology

## 2018-05-30 ENCOUNTER — Other Ambulatory Visit: Payer: Self-pay | Admitting: Endocrinology

## 2018-06-08 ENCOUNTER — Other Ambulatory Visit: Payer: Self-pay | Admitting: Endocrinology

## 2018-06-15 ENCOUNTER — Encounter: Payer: BLUE CROSS/BLUE SHIELD | Admitting: Internal Medicine

## 2018-06-19 ENCOUNTER — Other Ambulatory Visit: Payer: Self-pay | Admitting: Endocrinology

## 2018-06-25 ENCOUNTER — Other Ambulatory Visit: Payer: Self-pay | Admitting: Family Medicine

## 2018-06-25 ENCOUNTER — Other Ambulatory Visit: Payer: Self-pay | Admitting: Internal Medicine

## 2018-07-04 ENCOUNTER — Other Ambulatory Visit: Payer: Self-pay | Admitting: Endocrinology

## 2018-07-08 ENCOUNTER — Ambulatory Visit: Payer: BLUE CROSS/BLUE SHIELD | Admitting: Dietician

## 2018-07-11 ENCOUNTER — Encounter: Payer: Self-pay | Admitting: *Deleted

## 2018-07-19 ENCOUNTER — Other Ambulatory Visit: Payer: Self-pay | Admitting: Endocrinology

## 2018-08-02 ENCOUNTER — Other Ambulatory Visit: Payer: Self-pay | Admitting: Endocrinology

## 2018-08-03 ENCOUNTER — Other Ambulatory Visit: Payer: Self-pay | Admitting: Endocrinology

## 2018-08-08 ENCOUNTER — Other Ambulatory Visit: Payer: Self-pay | Admitting: Family Medicine

## 2018-08-08 ENCOUNTER — Other Ambulatory Visit (INDEPENDENT_AMBULATORY_CARE_PROVIDER_SITE_OTHER): Payer: BLUE CROSS/BLUE SHIELD

## 2018-08-08 DIAGNOSIS — E1165 Type 2 diabetes mellitus with hyperglycemia: Secondary | ICD-10-CM | POA: Diagnosis not present

## 2018-08-08 DIAGNOSIS — E7800 Pure hypercholesterolemia, unspecified: Secondary | ICD-10-CM

## 2018-08-08 LAB — BASIC METABOLIC PANEL
BUN: 19 mg/dL (ref 6–23)
CO2: 27 mEq/L (ref 19–32)
CREATININE: 1.21 mg/dL (ref 0.40–1.50)
Calcium: 9.9 mg/dL (ref 8.4–10.5)
Chloride: 101 mEq/L (ref 96–112)
GFR: 65.49 mL/min (ref >60.00)
Glucose, Bld: 86 mg/dL (ref 70–99)
POTASSIUM: 4.4 meq/L (ref 3.5–5.1)
Sodium: 136 mEq/L (ref 135–145)

## 2018-08-08 LAB — LIPID PANEL
CHOL/HDL RATIO: 4
Cholesterol: 165 mg/dL (ref 0–200)
HDL: 40.3 mg/dL (ref >39.00)
LDL CALC: 86 mg/dL (ref 0–99)
NONHDL: 124.26
Triglycerides: 191 mg/dL — ABNORMAL HIGH (ref 0.0–149.0)
VLDL: 38.2 mg/dL (ref 0.0–40.0)

## 2018-08-08 LAB — HEMOGLOBIN A1C: HEMOGLOBIN A1C: 6.9 % — AB (ref 4.6–6.5)

## 2018-08-09 NOTE — Progress Notes (Signed)
Patient ID: Jack Wallace, male   DOB: 09/02/1961, 57 y.o.   MRN: [id number]   Reason for Appointment : Followup of Type 2 Diabetes   History of Present Illness            Diagnosis: Type 2 diabetes mellitus, date of diagnosis: 1993         Past diabetes history: He has been treated with various drugs for his diabetes over the last several years. He has been taking metformin for at least 10 years and has been tolerating this. Over the years he has had additional medications to improve his control. He was also taking Actos previously and not clear if he had any side effects. This was stopped because of fear of long-term effects  In early 2014 he had poor control before he started walking and lost 15 pounds. Subsequently he was getting low blood sugar during the day especially before lunch and sometimes in the afternoon His glipizide was  reduced but even with 5 mg twice a day he was getting hypoglycemia.  This was reduced and Invokana added. Invokana was stopped because of lack of clear benefit in 09/2013 Because of inadequate control he was started on Victoza on his visit in 10/14 in addition to his metformin and glipizide.  Recent history:   Non-insulin hypoglycemic drugs: Metformin Er 1500 mg daily,   Jardiance 25 pm, Amaryl 2 mg hs, Ozempic 0.5 mg weekly  His A1c is again stable at 6.9 compared to 5/19  In 12/2017 he was started on Ozempic instead of Victoza  Current problems, blood sugar patterns and management:  Although his blood sugars are consistently improved with fairly good readings at home in stable A1c he has gained weight  This is despite seeing the dietitian 3 months ago  He has had only intermittent exercise because of ankle pain and only recently trying to do a little more activity  His main meal is frequently around 10-11 AM but he does not check readings afterward because of going to sleep  Had only one unusually high reading of over 200 late at night  but usually blood sugars are generally 140 or less  He says he feels hypoglycemic when his blood sugars are close to or below 100 and will feel weak and hungry and may have a snack at that time  More recently most of his sugars in the morning hours are close to 111, however usually not checking sugars in the afternoon or evenings anyway  He thinks he is trying to follow instructions from dietitian 3 months ago, and does not know why his weight is gone up  Monitors blood glucose:  1 times a day        Glucometer:  Contour  Blood Glucose readings from meter download:  OVERNIGHT range 99-255 with median 152 before meals and 114 after meals  Morning and midday median about 108 with blood sugar range 81-132  OVERALL median 115 with range 81-255  Dietician visit: Most recent: 5/19  Wt Readings from Last 3 Encounters:  08/10/18 (!) 305 lb (138.3 kg)  05/13/18 295 lb (133.8 kg)  05/10/18 299 lb 3.2 oz (135.7 kg)      Physical activity: exercise: Swims 3/7, somewhat irregular, recently some walking         Lab Results  Component Value Date   HGBA1C 6.9 (H) 08/08/2018   HGBA1C 6.9 (H) 05/06/2018   HGBA1C 8.2 01/17/2018   Lab Results  Component Value Date  MICROALBUR 2.4 (H) 06/24/2017   LDLCALC 86 08/08/2018   CREATININE 1.21 08/08/2018   Lab Results  Component Value Date   FRUCTOSAMINE 294 (H) 02/24/2018   FRUCTOSAMINE 314 (H) 11/25/2017   FRUCTOSAMINE 281 12/02/2016    OTHER active problems: See review of systems   Allergies as of 08/10/2018   No Known Allergies     Medication List        Accurate as of 08/10/18  9:05 AM. Always use your most recent med list.          Alpha Lipoic Acid 200 MG Caps Take 200 mg by mouth. 2 pills daily   aspirin 81 MG tablet Take 81 mg by mouth daily.   BAYER MICROLET LANCETS lancets Use to check blood sugar two times per day   BIOTENE ORALBALANCE DRY MOUTH Gel Apply 1/2 inch length onto tongue and spread evenly repeat as  often as needed Generic okay if available   CONTOUR NEXT EZ MONITOR w/Device Kit Use to check blood sugar two times per day   CONTOUR NEXT TEST test strip Generic drug:  glucose blood TEST TWICE A DAY   diclofenac 75 MG EC tablet Commonly known as:  VOLTAREN TAKE 1 TABLET BY MOUTH TWICE DAILY WITH FOOD   dicyclomine 10 MG capsule Commonly known as:  BENTYL Take 1 capsule (10 mg total) by mouth 3 (three) times daily before meals.   gabapentin 600 MG tablet Commonly known as:  NEURONTIN TAKE 1 TABLET BY MOUTH THREE TIMES A DAY   glimepiride 1 MG tablet Commonly known as:  AMARYL Take 2 tablets at bedtime   JARDIANCE 25 MG Tabs tablet Generic drug:  empagliflozin TAKE 1 TABLET BY MOUTH EVERY DAY   losartan 100 MG tablet Commonly known as:  COZAAR Take 1 tablet (100 mg total) by mouth daily.   Magnesium Oxide 400 (240 Mg) MG Tabs Take 1 tablet by mouth 3 (three) times daily. Take 1 in the morning and 2 in the evening.   metFORMIN 500 MG 24 hr tablet Commonly known as:  GLUCOPHAGE-XR TAKE 4 TABLETS BY MOUTH ONCE DAILY WITH SUPPER   multivitamin with minerals Tabs tablet Take 1 tablet by mouth daily.   NOVOTWIST 32G X 5 MM Misc Generic drug:  Insulin Pen Needle use daily   OZEMPIC 0.25 or 0.5 MG/DOSE Sopn Generic drug:  Semaglutide INJECT 0.5 MG SUBCUTANEOUSLY EVERY WEEK   pantoprazole 40 MG tablet Commonly known as:  PROTONIX TAKE 1 TABLET BY MOUTH TWICE A DAY   pravastatin 40 MG tablet Commonly known as:  PRAVACHOL take 1 tablet by mouth once daily   sertraline 100 MG tablet Commonly known as:  ZOLOFT take 2 tablets by mouth once daily   vitamin B-12 500 MCG tablet Commonly known as:  CYANOCOBALAMIN Take 500 mcg by mouth daily.   vitamin C 1000 MG tablet Take 1,000 mg by mouth daily.   VITAMIN D PO Take 1 tablet by mouth daily. D3 1000 IU   VITAMIN E PO Take 1 capsule by mouth daily. 400 IU daily       Allergies: No Known Allergies  Past  Medical History:  Diagnosis Date  . Depression   . Diabetes mellitus without complication ([place])   . Hyperlipidemia   . Hypertension   . Ischemic colitis [facility])     Past Surgical History:  Procedure Laterality Date  . COLONOSCOPY  06/01/2014  . NASAL SEPTUM SURGERY      Family History  Problem  Relation Age of Onset  . Hypertension Mother   . Stroke Father   . Diabetes Maternal Grandfather   . Colon cancer Neg Hx   . Throat cancer Neg Hx   . Prostate cancer Neg Hx   . Pancreatic cancer Neg Hx   . Heart disease Neg Hx   . Kidney disease Neg Hx   . Liver disease Neg Hx     Social History:  reports that he has never smoked. He has never used smokeless tobacco. He reports that he does not drink alcohol or use drugs.    Review of Systems      HYPERTENSION: Taking 100 mg losartan  Blood pressure appears improved with increasing losartan  Home BP ranges from 120-140/ 80, uses arm cuff    BP Readings from Last 3 Encounters:  08/10/18 122/80  05/13/18 125/82  05/10/18 134/88       Hyperlipidemia: taking pravastatin  40 mg for several years. LDL is slightly improved, previously was higher than target of 100, has done better on the diet   Lab Results  Component Value Date   CHOL 165 08/08/2018   HDL 40.30 08/08/2018   LDLCALC 86 08/08/2018   LDLDIRECT 103.0 02/24/2018   TRIG 191.0 (H) 08/08/2018   CHOLHDL 4 08/08/2018      NEUROPATHY He has had symptoms of neuropathy in feet.   He had burning, has occasional tingling with improved control recently  He  takes gabapentin 600 mg tid with generally good relief   Last eye exam 03/2017  Physical Examination:  BP 122/80 (BP Location: Right Arm, Patient Position: Sitting, Cuff Size: Large)   Pulse 64   Ht 6' (1.829 m)   Wt (!) 305 lb (138.3 kg)   SpO2 98%   BMI 41.37 kg/m      ASSESSMENT/PLAN:   Diabetes type 2 with obesity:  See history of present illness for detailed discussion of current diabetes  management, blood sugar patterns and problems identified  His A1c is fairly consistently improved with the lab again showing the result to be 6.9 as before  This is from continuing [place], as also Jardiance and somewhat better diet  However has not lost weight Since he has symptoms of hypoglycemia even when blood sugars below 100 he can reduce his glimepiride to 1 tablet now Hopefully will lose weight with increased exercise now Discussed blood sugar targets after meals and we will try to do some readings on his days off after his main meal  Lipids: Much improved with diet and no change in the pravastatin, LDL below 100    HYPERTENSION: Better controlled with increased losartan which he will continue      Patient Instructions  Check blood sugars on waking up  2/7 days  Also check blood sugars about 2 hours after a meal and do this after different meals by rotation  Recommended blood sugar levels on waking up is 90-130 and about 2 hours after meal is 130-160  Please bring your blood sugar monitor to each visit, thank you          Elayne Snare 08/10/2018, 9:05 AM

## 2018-08-10 ENCOUNTER — Encounter: Payer: Self-pay | Admitting: Endocrinology

## 2018-08-10 ENCOUNTER — Ambulatory Visit: Payer: BLUE CROSS/BLUE SHIELD | Admitting: Endocrinology

## 2018-08-10 VITALS — BP 122/80 | HR 64 | Ht 72.0 in | Wt 305.0 lb

## 2018-08-10 DIAGNOSIS — E1165 Type 2 diabetes mellitus with hyperglycemia: Secondary | ICD-10-CM | POA: Diagnosis not present

## 2018-08-10 NOTE — Patient Instructions (Signed)
Check blood sugars on waking up  2/7 days  Also check blood sugars about 2 hours after a meal and do this after different meals by rotation  Recommended blood sugar levels on waking up is 90-130 and about 2 hours after meal is 130-160  Please bring your blood sugar monitor to each visit, thank you  

## 2018-08-24 ENCOUNTER — Other Ambulatory Visit: Payer: Self-pay | Admitting: Endocrinology

## 2018-08-29 ENCOUNTER — Other Ambulatory Visit: Payer: Self-pay | Admitting: Endocrinology

## 2018-09-11 ENCOUNTER — Other Ambulatory Visit: Payer: Self-pay | Admitting: Endocrinology

## 2018-09-11 ENCOUNTER — Other Ambulatory Visit: Payer: Self-pay | Admitting: Family Medicine

## 2018-09-11 DIAGNOSIS — I10 Essential (primary) hypertension: Secondary | ICD-10-CM

## 2018-09-13 ENCOUNTER — Telehealth: Payer: Self-pay | Admitting: Endocrinology

## 2018-09-13 MED ORDER — PRAVASTATIN SODIUM 40 MG PO TABS: 40.0000 mg | ORAL_TABLET | Freq: Every day | ORAL | 3 refills | Status: DC

## 2018-09-13 NOTE — Telephone Encounter (Signed)
No request received, refill sent today.

## 2018-09-13 NOTE — Telephone Encounter (Signed)
Patient has been unable to get his RX for  pravastatin (PRAVACHOL) 40 MG tablet 90 tablet 3   -Pharmacy told him that they have sent over requests but have heard nothing back. Please send RX for the medication listed herein to [facility] on [address] Asap.

## 2018-09-18 ENCOUNTER — Other Ambulatory Visit: Payer: Self-pay | Admitting: Endocrinology

## 2018-09-22 ENCOUNTER — Encounter: Payer: Self-pay | Admitting: Internal Medicine

## 2018-09-22 ENCOUNTER — Ambulatory Visit: Payer: BLUE CROSS/BLUE SHIELD | Admitting: Internal Medicine

## 2018-09-22 ENCOUNTER — Other Ambulatory Visit: Payer: Self-pay

## 2018-09-22 VITALS — BP 139/83 | HR 117 | Temp 99.3°F | Ht 72.0 in | Wt 300.2 lb

## 2018-09-22 DIAGNOSIS — K219 Gastro-esophageal reflux disease without esophagitis: Secondary | ICD-10-CM

## 2018-09-22 DIAGNOSIS — R1013 Epigastric pain: Secondary | ICD-10-CM

## 2018-09-22 DIAGNOSIS — R3912 Poor urinary stream: Secondary | ICD-10-CM

## 2018-09-22 DIAGNOSIS — R110 Nausea: Secondary | ICD-10-CM | POA: Diagnosis not present

## 2018-09-22 DIAGNOSIS — K581 Irritable bowel syndrome with constipation: Secondary | ICD-10-CM

## 2018-09-22 DIAGNOSIS — Z8719 Personal history of other diseases of the digestive system: Secondary | ICD-10-CM

## 2018-09-22 LAB — COMPREHENSIVE METABOLIC PANEL
ALT: 16 U/L (ref 0–44)
AST: 21 U/L (ref 15–41)
Albumin: 4.5 g/dL (ref 3.5–5.0)
Alkaline Phosphatase: 59 U/L (ref 38–126)
Anion gap: 9 (ref 5–15)
BUN: 16 mg/dL (ref 6–20)
CHLORIDE: 104 mmol/L (ref 98–111)
CO2: 24 mmol/L (ref 22–32)
CREATININE: 1.11 mg/dL (ref 0.61–1.24)
Calcium: 10.1 mg/dL (ref 8.9–10.3)
GFR calc Af Amer: >60 mL/min (ref >60)
GFR calc non Af Amer: >60 mL/min (ref >60)
GLUCOSE: 125 mg/dL — AB (ref 70–99)
POTASSIUM: 4.6 mmol/L (ref 3.5–5.1)
SODIUM: 137 mmol/L (ref 135–145)
Total Bilirubin: 1.1 mg/dL (ref 0.3–1.2)
Total Protein: 7.7 g/dL (ref 6.5–8.1)

## 2018-09-22 LAB — CBC WITH DIFFERENTIAL/PLATELET
ABS IMMATURE GRANULOCYTES: 0 10*3/uL (ref 0.0–0.1)
BASOS ABS: 0.1 10*3/uL (ref 0.0–0.1)
BASOS PCT: 1 %
Eosinophils Absolute: 0.2 10*3/uL (ref 0.0–0.7)
Eosinophils Relative: 2 %
HCT: 48.9 % (ref 39.0–52.0)
Hemoglobin: 15 g/dL (ref 13.0–17.0)
IMMATURE GRANULOCYTES: 0 %
Lymphocytes Relative: 35 %
Lymphs Abs: 3 10*3/uL (ref 0.7–4.0)
MCH: 27.2 pg (ref 26.0–34.0)
MCHC: 30.7 g/dL (ref 30.0–36.0)
MCV: 88.6 fL (ref 78.0–100.0)
MONO ABS: 0.7 10*3/uL (ref 0.1–1.0)
Monocytes Relative: 8 %
NEUTROS ABS: 4.6 10*3/uL (ref 1.7–7.7)
NEUTROS PCT: 54 %
PLATELETS: 270 10*3/uL (ref 150–400)
RBC: 5.52 MIL/uL (ref 4.22–5.81)
RDW: 13.9 % (ref 11.5–15.5)
WBC: 8.4 10*3/uL (ref 4.0–10.5)

## 2018-09-22 LAB — LACTIC ACID, PLASMA: LACTIC ACID, VENOUS: 1.7 mmol/L (ref 0.5–1.9)

## 2018-09-22 MED ORDER — ONDANSETRON HCL 4 MG PO TABS: 4.0000 mg | ORAL_TABLET | Freq: Every day | ORAL | 1 refills | Status: DC | PRN

## 2018-09-22 NOTE — Progress Notes (Signed)
   CC: Nausea  HPI:  Jack Wallace is a 57 y.o. with PMH as below. He presents today with nausea for one week and abdominal pain that began two days ago. He describes the pain as constant and like a dull ache. He has not vomited. He has had no changes in BM and has been going regularly. He denies recent illness but does feel hot and like he has been sweating a lot. He denies any known sick contacts. He denies dizziness or recent weight loss. He has a history of GERD and states that this does not feel like his normal reflux. The pain does not really change with eating, maybe getting slightly worse. He has a history of ischemic colitis in 2015 that was managed medically. He states this pain feels slightly like it did then although his colitis at that time was thought to be caused by hypotension. He checks his bp at home and states it has been around around 130/70s. He denies SOB or dyspnea. He has had trouble starting his stream when he urinates and sometimes the streams stops and goes. He has occasional feelings of fullness and this has been ongoing for the past few months. He denies lower abdominal pain, dysuria, hematuria.  He denies recent changes in diet, medication, or travel. He has tried some OTC nausea medication which has not really helped. He's been able to continue eating and drinking fluids but does not have much of an appetite.     Please see A&P for assessment of the patient's chronic medical conditions.   ADDENDUM: Spoke with patient about normal lab results. He states he is feeling somewhat better today. Recommended he call the clinic next week if his symptoms don't improve or worsen.   Past Medical History:  Diagnosis Date  . Depression   . Diabetes mellitus without complication ([place])   . Hyperlipidemia   . Hypertension   . Ischemic colitis ([place])    Review of Systems:   ROS as noted above. All other ROS negative.  Physical Exam:  Constitution: NAD, non-diaphoretic,  obese HENT: [place], AT Eyes: no scleral icterus, EOM intact, PERLLA Cardio: tachycardic, regular rhythm, no m/r/g Respiratory: CTAB, no wheezing, rales, rhonchi Abdominal: +BS, TTP epigastric area, soft, non-distended, no rebound tenderness MSK: no LE edema, strength symmetrical Neuro: A&Ox3, cooperative, normal affect Skin: c/d/i    Vitals:   09/22/18 1541  BP: 139/83  Pulse: (!) 117  Temp: 99.3 F (37.4 C)  TempSrc: Oral  SpO2: 95%  Weight: (!) 300 lb 3.2 oz (136.2 kg)  Height: 6' (1.829 m)    Assessment & Plan:   See Encounters Tab for problem based charting.  Patient seen with Dr. Evette Doffing

## 2018-09-22 NOTE — Patient Instructions (Addendum)
Thank you for coming to the clinic today. It was a pleasure to see you.   For your nausea and abdominal pain, we will call you this evening or tomorrow with results of your lab tests today.   You have been prescribed zofran 4mg  to help with your nausea. Please take one tablet every 8 hours as needed for nausea.   If you have any severe dizziness, feel as if you are going to faint, fever > 100.4, worsening abdominal pain, or other changes of concern please go straight to the emergency room.   Otherwise please call the office next week if symptoms are not improving.

## 2018-09-23 ENCOUNTER — Other Ambulatory Visit: Payer: Self-pay | Admitting: Endocrinology

## 2018-09-23 NOTE — Assessment & Plan Note (Signed)
He presents with nausea for the past week and pain for the past two days. He has a history of IBC-constipation and ischemic colitis in 2015. Please see note for details of HPI. He was unable to provide urine for UA. On physical exam his abdomen does not appear acutely tender or distended, and this does not appear to be a recurrence of his colitis. However, we will obtain labs to rule out acute ischemia or other etiology.   - CBC with diff, CMP, LA  - zofran 4 mg q8h prn nausea  - return if symptoms do not improve and go to ED if symptoms become significantly worse

## 2018-09-26 NOTE — Addendum Note (Signed)
Addended by: Lalla Brothers T on: 09/26/2018 07:50 AM   Modules accepted: Level of Service

## 2018-09-26 NOTE — Progress Notes (Signed)
Internal Medicine Clinic Attending  I saw and evaluated the patient.  I personally confirmed the key portions of the history and exam documented by Dr. Sharon Seller and I reviewed pertinent patient test results.  The assessment, diagnosis, and plan were formulated together and I agree with the documentation in the resident's note.  Patient here with several days of nausea and mild abdominal pain. He is tachycardic and mildly dehydrated, but normal blood pressure and functionally doing well on his own. Reports good oral intake still. His abdominal exam is reassuring, no distention and only mild tenderness. Labs also reassuring with no leukocytosis, normal renal function, no acidosis, normal lactate, and normal liver function. I think this is likely a self limited gastritis or enteritis. With only minimal abdominal pain I think we can defer CT imaging, he is to rtc if symptoms progress.

## 2018-09-27 ENCOUNTER — Other Ambulatory Visit: Payer: Self-pay | Admitting: Endocrinology

## 2018-10-05 ENCOUNTER — Other Ambulatory Visit: Payer: Self-pay | Admitting: Endocrinology

## 2018-10-11 ENCOUNTER — Other Ambulatory Visit: Payer: Self-pay | Admitting: Endocrinology

## 2018-10-11 DIAGNOSIS — I10 Essential (primary) hypertension: Secondary | ICD-10-CM

## 2018-10-18 ENCOUNTER — Other Ambulatory Visit: Payer: Self-pay | Admitting: Endocrinology

## 2018-10-25 ENCOUNTER — Other Ambulatory Visit: Payer: Self-pay | Admitting: Endocrinology

## 2018-11-01 ENCOUNTER — Other Ambulatory Visit: Payer: Self-pay | Admitting: Endocrinology

## 2018-11-09 ENCOUNTER — Other Ambulatory Visit: Payer: Self-pay | Admitting: Endocrinology

## 2018-11-09 ENCOUNTER — Ambulatory Visit (INDEPENDENT_AMBULATORY_CARE_PROVIDER_SITE_OTHER): Payer: BLUE CROSS/BLUE SHIELD | Admitting: Pharmacist

## 2018-11-09 DIAGNOSIS — E1142 Type 2 diabetes mellitus with diabetic polyneuropathy: Secondary | ICD-10-CM | POA: Diagnosis not present

## 2018-11-09 NOTE — Patient Instructions (Addendum)
Please record the time, amount and what food drinks and activities you have while wearing the continuous glucose monitor(CGM) in the folder provided.  Bring the folder with you to follow up appointments  Do not have a CT or an MRI while wearing the CGM.   Please make an appointment for 1 week with me and a doctor for the first of two CGM downloads..   You will also return in 2 weeks to have your second download and the CGM removed.  

## 2018-11-09 NOTE — Progress Notes (Signed)
S: Jack Wallace is a 57 y.o. male reports to clinical pharmacist appointment for continuous glucose monitoring.  No Known Allergies Medication Sig  Alpha Lipoic Acid 200 MG CAPS Take 200 mg by mouth. 2 pills daily  Artificial Saliva (BIOTENE ORALBALANCE DRY MOUTH) GEL Apply 1/2 inch length onto tongue and spread evenly repeat as often as needed Generic okay if available  Ascorbic Acid (VITAMIN C) 1000 MG tablet Take 1,000 mg by mouth daily.  aspirin 81 MG tablet Take 81 mg by mouth daily.  BAYER MICROLET LANCETS lancets Use to check blood sugar two times per day  Blood Glucose Monitoring Suppl (CONTOUR NEXT EZ MONITOR) w/Device KIT Use to check blood sugar two times per day  Cholecalciferol (VITAMIN D PO) Take 1 tablet by mouth daily. D3 1000 IU  CONTOUR NEXT TEST test strip TEST TWICE A DAY  diclofenac (VOLTAREN) 75 MG EC tablet TAKE 1 TABLET BY MOUTH TWICE DAILY WITH FOOD  dicyclomine (BENTYL) 10 MG capsule Take 1 capsule (10 mg total) by mouth 3 (three) times daily before meals.  gabapentin (NEURONTIN) 600 MG tablet TAKE 1 TABLET BY MOUTH THREE TIMES A DAY  glimepiride (AMARYL) 1 MG tablet Take 2 tablets at bedtime  JARDIANCE 25 MG TABS tablet TAKE 1 TABLET BY MOUTH EVERY DAY  losartan (COZAAR) 100 MG tablet TAKE 1 TABLET BY MOUTH EVERY DAY  Magnesium Oxide 400 (240 MG) MG TABS Take 1 tablet by mouth 3 (three) times daily. Take 1 in the morning and 2 in the evening.  metFORMIN (GLUCOPHAGE-XR) 500 MG 24 hr tablet TAKE 4 TABLETS BY MOUTH ONCE DAILY WITH SUPPER  Multiple Vitamin (MULTIVITAMIN WITH MINERALS) TABS tablet Take 1 tablet by mouth daily.  NOVOTWIST 32G X 5 MM MISC use daily  ondansetron (ZOFRAN) 4 MG tablet Take 1 tablet (4 mg total) by mouth daily as needed for nausea or vomiting.  OZEMPIC, 0.25 OR 0.5 MG/DOSE, 2 MG/1.5ML SOPN INJECT 0.5 MG SUBCUTANEOUS ONCE WEEKLY  pantoprazole (PROTONIX) 40 MG tablet TAKE 1 TABLET BY MOUTH TWICE A DAY  pravastatin (PRAVACHOL) 40 MG tablet  Take 1 tablet (40 mg total) by mouth daily.  sertraline (ZOLOFT) 100 MG tablet take 2 tablets by mouth once daily  vitamin B-12 (CYANOCOBALAMIN) 500 MCG tablet Take 500 mcg by mouth daily.  VITAMIN E PO Take 1 capsule by mouth daily. 400 IU daily   Past Medical History:  Diagnosis Date  . Depression   . Diabetes mellitus without complication ([place])   . Hyperlipidemia   . Hypertension   . Ischemic colitis [facility])    Social History   Socioeconomic History  . Marital status: Married    Spouse name: Not on file  . Number of children: 0  . Years of education: Not on file  . Highest education level: Not on file  Occupational History  . Occupation: Tree surgeon  . Financial resource strain: Not on file  . Food insecurity:    Worry: Not on file    Inability: Not on file  . Transportation needs:    Medical: Not on file    Non-medical: Not on file  Tobacco Use  . Smoking status: Never Smoker  . Smokeless tobacco: Never Used  Substance and Sexual Activity  . Alcohol use: No  . Drug use: No  . Sexual activity: Not on file  Lifestyle  . Physical activity:    Days per week: Not on file    Minutes per session: Not on file  .  Stress: Not on file  Relationships  . Social connections:    Talks on phone: Not on file    Gets together: Not on file    Attends religious service: Not on file    Active member of club or organization: Not on file    Attends meetings of clubs or organizations: Not on file    Relationship status: Not on file  Other Topics Concern  . Not on file  Social History Narrative  . Not on file   Family History  Problem Relation Age of Onset  . Hypertension Mother   . Stroke Father   . Diabetes Maternal Grandfather   . Colon cancer Neg Hx   . Throat cancer Neg Hx   . Prostate cancer Neg Hx   . Pancreatic cancer Neg Hx   . Heart disease Neg Hx   . Kidney disease Neg Hx   . Liver disease Neg Hx    O: Component Value Date/Time   CHOL 165  08/08/2018 0753   HDL 40.30 08/08/2018 0753   TRIG 191.0 (H) 08/08/2018 0753   AST 21 09/22/2018 1645   ALT 16 09/22/2018 1645   NA 137 09/22/2018 1645   K 4.6 09/22/2018 1645   CL 104 09/22/2018 1645   CO2 24 09/22/2018 1645   GLUCOSE 125 (H) 09/22/2018 1645   HGBA1C 6.9 (H) 08/08/2018 0753   HGBA1C 6.6 03/17/2017   BUN 16 09/22/2018 1645   CREATININE 1.11 09/22/2018 1645   CREATININE 0.89 03/29/2015 1655   CALCIUM 10.1 09/22/2018 1645   GFRNONAA >60 09/22/2018 1645   GFRNONAA >89 03/29/2015 1655   GFRAA >60 09/22/2018 1645   GFRAA >89 03/29/2015 1655   WBC 8.4 09/22/2018 1645   HGB 15.0 09/22/2018 1645   HCT 48.9 09/22/2018 1645   PLT 270 09/22/2018 1645   Ht Readings from Last 2 Encounters:  09/22/18 6' (1.829 m)  08/10/18 6' (1.829 m)   Wt Readings from Last 2 Encounters:  09/22/18 (!) 300 lb 3.2 oz (136.2 kg)  08/10/18 (!) 305 lb (138.3 kg)   There is no height or weight on file to calculate BMI. BP Readings from Last 3 Encounters:  09/22/18 139/83  08/10/18 122/80  05/13/18 125/82   A/P: Patient reports for [facility] intern CGM initiative per PCP and endocrinologist approval.  Documentation for Freestyle Libre Pro Continuous glucose monitoring Freestyle Libre Pro CGM sensor placed today. Patient was educated about wearing sensor, keeping food, activity and medication log and when to call office. Patient was educated about how to care for the sensor and not to have an MRI, CT or Diathermy while wearing the sensor. Follow up was arranged with the patient for 1 week.   Lot #: [id number] Serial #: [id number] Expiration Date: 04-20-19  Flossie Dibble, PharmD 11/09/2018 9:18 AM.  An after visit summary was provided and patient advised to follow up if any changes in condition or questions regarding medications arise.   The patient verbalized understanding of information provided by repeating back concepts discussed.   15 minutes spent face-to-face with the patient during  the encounter. 80% of time spent on education. 20% of time was spent on assessment, plan, and coordination of care.

## 2018-11-15 ENCOUNTER — Encounter: Payer: Self-pay | Admitting: Internal Medicine

## 2018-11-15 ENCOUNTER — Ambulatory Visit (INDEPENDENT_AMBULATORY_CARE_PROVIDER_SITE_OTHER): Payer: BLUE CROSS/BLUE SHIELD | Admitting: Internal Medicine

## 2018-11-15 ENCOUNTER — Other Ambulatory Visit: Payer: Self-pay

## 2018-11-15 ENCOUNTER — Ambulatory Visit: Payer: BLUE CROSS/BLUE SHIELD | Admitting: Pharmacist

## 2018-11-15 VITALS — BP 114/82 | HR 94 | Temp 98.6°F | Ht 72.0 in | Wt 304.1 lb

## 2018-11-15 DIAGNOSIS — Z7984 Long term (current) use of oral hypoglycemic drugs: Secondary | ICD-10-CM | POA: Diagnosis not present

## 2018-11-15 DIAGNOSIS — E1159 Type 2 diabetes mellitus with other circulatory complications: Secondary | ICD-10-CM

## 2018-11-15 DIAGNOSIS — E1142 Type 2 diabetes mellitus with diabetic polyneuropathy: Secondary | ICD-10-CM | POA: Diagnosis not present

## 2018-11-15 DIAGNOSIS — Z79899 Other long term (current) drug therapy: Secondary | ICD-10-CM

## 2018-11-15 DIAGNOSIS — I10 Essential (primary) hypertension: Secondary | ICD-10-CM

## 2018-11-15 DIAGNOSIS — I152 Hypertension secondary to endocrine disorders: Secondary | ICD-10-CM

## 2018-11-15 NOTE — Patient Instructions (Addendum)
Thank you for allowing [place] to provide your care today. Today we discussed your diabetes    Today we made the following changes to your medications.   Please stop taking the glimepiride 1 mg daily.   Please follow-up in 1 week.    Should you have any questions or concerns please call the internal medicine clinic at [phone].    Please return in 7 days with [facility] for Download #2.

## 2018-11-15 NOTE — Progress Notes (Signed)
CC: CGM 1st download  HPI: [name] is a 57 y.o.  with a PMH listed below presenting for CGM 1st download.    Jack Wallace wore the CGM for 7 days. The average reading was 118, % time in target was 94%, % time below target was 4%, and % time above target was. 2%. Intervention will be to discontinue glymepiride. The patient will be scheduled to see [facility] for a final appointment.   Please see A&P for status of the patient's chronic medical conditions  Past Medical History:  Diagnosis Date  . Depression   . Diabetes mellitus without complication ([place])   . Hyperlipidemia   . Hypertension   . Ischemic colitis ([place])    Review of Systems: Refer to history of present illness and assessment and plans for pertinent review of systems, all others reviewed and negative.  Physical Exam:  Vitals:   11/15/18 0839  BP: 114/82  Pulse: 94  Temp: 98.6 F (37 C)  TempSrc: Oral  SpO2: 96%  Weight: (!) 304 lb 1.6 oz (137.9 kg)  Height: 6' (1.829 m)    Physical Exam  Constitutional: He is oriented to person, place, and time and well-developed, well-nourished, and in no distress.  Cardiovascular: Normal rate, regular rhythm and normal heart sounds.  Pulmonary/Chest: Effort normal and breath sounds normal. No respiratory distress.  Abdominal: Soft. Bowel sounds are normal. He exhibits no distension.  Neurological: He is alert and oriented to person, place, and time.  Skin: Skin is warm and dry. No erythema.    Social History   Socioeconomic History  . Marital status: Married    Spouse name: Not on file  . Number of children: 0  . Years of education: Not on file  . Highest education level: Not on file  Occupational History  . Occupation: Tree surgeon  . Financial resource strain: Not on file  . Food insecurity:    Worry: Not on file    Inability: Not on file  . Transportation needs:    Medical: Not on file    Non-medical: Not on file  Tobacco Use  . Smoking  status: Never Smoker  . Smokeless tobacco: Never Used  Substance and Sexual Activity  . Alcohol use: No  . Drug use: No  . Sexual activity: Not on file  Lifestyle  . Physical activity:    Days per week: Not on file    Minutes per session: Not on file  . Stress: Not on file  Relationships  . Social connections:    Talks on phone: Not on file    Gets together: Not on file    Attends religious service: Not on file    Active member of club or organization: Not on file    Attends meetings of clubs or organizations: Not on file    Relationship status: Not on file  . Intimate partner violence:    Fear of current or ex partner: Not on file    Emotionally abused: Not on file    Physically abused: Not on file    Forced sexual activity: Not on file  Other Topics Concern  . Not on file  Social History Narrative  . Not on file   Family History  Problem Relation Age of Onset  . Hypertension Mother   . Stroke Father   . Diabetes Maternal Grandfather   . Colon cancer Neg Hx   . Throat cancer Neg Hx   . Prostate cancer  Neg Hx   . Pancreatic cancer Neg Hx   . Heart disease Neg Hx   . Kidney disease Neg Hx   . Liver disease Neg Hx     Assessment & Plan:   See Encounters Tab for problem based charting.  Patient seen with Dr. Angelia Mould

## 2018-11-15 NOTE — Progress Notes (Signed)
Patient was seen today in a co-visit with Dr. Sherry Ruffing

## 2018-11-15 NOTE — Patient Instructions (Signed)
Documentation for Freestyle Libre Pro Continuous glucose monitoring Freestyle Libre Pro CGM sensor download #1 today. . Follow up was arranged with the patient for 1 week.   Kathyrn Sheriff, Student-PharmD 11/15/2018 10:23 AM.

## 2018-11-15 NOTE — Progress Notes (Signed)
S: Jack Wallace is a 57 y.o. male reports to clinical pharmacist appointment for CGM download #1.  No Known Allergies Prior to Admission medications   Medication Sig Start Date End Date Taking? Authorizing Provider  Alpha Lipoic Acid 200 MG CAPS Take 200 mg by mouth. 2 pills daily    [provider]  Artificial Saliva (BIOTENE ORALBALANCE DRY MOUTH) GEL Apply 1/2 inch length onto tongue and spread evenly repeat as often as needed Generic okay if available 08/23/14   McLean-Scocuzza, Nino Glow, MD  Ascorbic Acid (VITAMIN C) 1000 MG tablet Take 1,000 mg by mouth daily.    [provider]  aspirin 81 MG tablet Take 81 mg by mouth daily.    [provider]  BAYER MICROLET LANCETS lancets Use to check blood sugar two times per day 12/15/16   Elayne Snare, MD  Blood Glucose Monitoring Suppl (CONTOUR NEXT EZ MONITOR) w/Device KIT Use to check blood sugar two times per day 12/15/16   Elayne Snare, MD  Cholecalciferol (VITAMIN D PO) Take 1 tablet by mouth daily. D3 1000 IU    [provider]  CONTOUR NEXT TEST test strip TEST TWICE A DAY 08/03/18   Elayne Snare, MD  diclofenac (VOLTAREN) 75 MG EC tablet TAKE 1 TABLET BY MOUTH TWICE DAILY WITH FOOD 08/09/18   Dickie La, MD  dicyclomine (BENTYL) 10 MG capsule Take 1 capsule (10 mg total) by mouth 3 (three) times daily before meals. 07/21/17   Ladene Artist, MD  gabapentin (NEURONTIN) 600 MG tablet TAKE 1 TABLET BY MOUTH THREE TIMES A DAY 11/10/18   Elayne Snare, MD  JARDIANCE 25 MG TABS tablet TAKE 1 TABLET BY MOUTH EVERY DAY 10/19/18   Elayne Snare, MD  losartan (COZAAR) 100 MG tablet TAKE 1 TABLET BY MOUTH EVERY DAY 10/12/18   Elayne Snare, MD  Magnesium Oxide 400 (240 MG) MG TABS Take 1 tablet by mouth 3 (three) times daily. Take 1 in the morning and 2 in the evening. 07/18/15   Lenore Cordia, MD  metFORMIN (GLUCOPHAGE-XR) 500 MG 24 hr tablet TAKE 4 TABLETS BY MOUTH ONCE DAILY WITH SUPPER 11/02/18   Elayne Snare, MD   Multiple Vitamin (MULTIVITAMIN WITH MINERALS) TABS tablet Take 1 tablet by mouth daily.    [provider]  NOVOTWIST 32G X 5 MM MISC use daily 09/29/17   Elayne Snare, MD  ondansetron (ZOFRAN) 4 MG tablet Take 1 tablet (4 mg total) by mouth daily as needed for nausea or vomiting. 09/22/18 09/22/19  Seawell, Jaimie A, DO  OZEMPIC, 0.25 OR 0.5 MG/DOSE, 2 MG/1.5ML SOPN INJECT 0.5 MG SUBCUTANEOUS ONCE WEEKLY 10/26/18   Elayne Snare, MD  pantoprazole (PROTONIX) 40 MG tablet TAKE 1 TABLET BY MOUTH TWICE A DAY 06/27/18   Sid Falcon, MD  pravastatin (PRAVACHOL) 40 MG tablet Take 1 tablet (40 mg total) by mouth daily. 09/13/18   Elayne Snare, MD  sertraline (ZOLOFT) 100 MG tablet take 2 tablets by mouth once daily 01/31/18   Lenore Cordia, MD  vitamin B-12 (CYANOCOBALAMIN) 500 MCG tablet Take 500 mcg by mouth daily.    [provider]  VITAMIN E PO Take 1 capsule by mouth daily. 400 IU daily    [provider]   Past Medical History:  Diagnosis Date  . Depression   . Diabetes mellitus without complication ([place])   . Hyperlipidemia   . Hypertension   . Ischemic colitis [facility])    Social History  Socioeconomic History  . Marital status: Married    Spouse name: Not on file  . Number of children: 0  . Years of education: Not on file  . Highest education level: Not on file  Occupational History  . Occupation: Tree surgeon  . Financial resource strain: Not on file  . Food insecurity:    Worry: Not on file    Inability: Not on file  . Transportation needs:    Medical: Not on file    Non-medical: Not on file  Tobacco Use  . Smoking status: Never Smoker  . Smokeless tobacco: Never Used  Substance and Sexual Activity  . Alcohol use: No  . Drug use: No  . Sexual activity: Not on file  Lifestyle  . Physical activity:    Days per week: Not on file    Minutes per session: Not on file  . Stress: Not on file  Relationships  . Social connections:    Talks  on phone: Not on file    Gets together: Not on file    Attends religious service: Not on file    Active member of club or organization: Not on file    Attends meetings of clubs or organizations: Not on file    Relationship status: Not on file  Other Topics Concern  . Not on file  Social History Narrative  . Not on file   Family History  Problem Relation Age of Onset  . Hypertension Mother   . Stroke Father   . Diabetes Maternal Grandfather   . Colon cancer Neg Hx   . Throat cancer Neg Hx   . Prostate cancer Neg Hx   . Pancreatic cancer Neg Hx   . Heart disease Neg Hx   . Kidney disease Neg Hx   . Liver disease Neg Hx     O:    Component Value Date/Time   CHOL 165 08/08/2018 0753   HDL 40.30 08/08/2018 0753   TRIG 191.0 (H) 08/08/2018 0753   AST 21 09/22/2018 1645   ALT 16 09/22/2018 1645   NA 137 09/22/2018 1645   K 4.6 09/22/2018 1645   CL 104 09/22/2018 1645   CO2 24 09/22/2018 1645   GLUCOSE 125 (H) 09/22/2018 1645   HGBA1C 6.9 (H) 08/08/2018 0753   HGBA1C 6.6 03/17/2017   BUN 16 09/22/2018 1645   CREATININE 1.11 09/22/2018 1645   CREATININE 0.89 03/29/2015 1655   CALCIUM 10.1 09/22/2018 1645   GFRNONAA >60 09/22/2018 1645   GFRNONAA >89 03/29/2015 1655   GFRAA >60 09/22/2018 1645   GFRAA >89 03/29/2015 1655   WBC 8.4 09/22/2018 1645   HGB 15.0 09/22/2018 1645   HCT 48.9 09/22/2018 1645   PLT 270 09/22/2018 1645   Ht Readings from Last 2 Encounters:  11/15/18 6' (1.829 m)  09/22/18 6' (1.829 m)   Wt Readings from Last 2 Encounters:  11/15/18 (!) 304 lb 1.6 oz (137.9 kg)  09/22/18 (!) 300 lb 3.2 oz (136.2 kg)   There is no height or weight on file to calculate BMI. BP Readings from Last 3 Encounters:  11/15/18 114/82  09/22/18 139/83  08/10/18 122/80     A/P: Current diabetes medication regimen includes glimepiride 1 mg daily, metformin XR 500 mg two tablets twice daily, semaglutide 0.5 mg once weekly, and empagliflozin 25 mg once daily.   CGM  data from week 1 was downloaded and analyzed: Above 180 mg/dL 2% of the time In target range (70-180  mg/dL) 94% of the time Below 70 mg/dL 4% of the time   Recommendations: Pharmacy recommends to stop glimepiride 1 mg daily and decrease empagliflozin dose to 10 mg daily.   An after visit summary was provided and patient advised to follow up in 1 week for CGM download #2 or sooner if any changes in condition or questions regarding medications arise.   The patient verbalized understanding of information provided by repeating back concepts discussed.   Kathyrn Sheriff PharmD Student

## 2018-11-16 NOTE — Assessment & Plan Note (Signed)
  Jack Wallace wore the CGM for 7 days. The average reading was 118, % time in target was 94%, % time below target was 4%, and % time above target was. 2%. Intervention Wallace be to discontinue glymepiride. The patient Wallace be scheduled to see [facility] for a final appointment.  Patient is currently on glimepiride 1 mg daily, Jardiance 25 mg daily, and metformin 2000 mg daily.  He reports that he has been doing okay.  He reports that he has been having some episodes of hypoglycemia where he is glucose Wallace be around 60-70, he Wallace get very weak, lightheaded, started having headaches as well and some nausea, denies any diaphoresis, vomiting, change in appetite or weight.  He reports that this Wallace occur around 2-3 aM when he is at work. 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  He reports that he does follow-up with Dr. Dwyane Dee and Wallace see him in late December.  Given his symptomatic hypoglycemia in the 4% time below target on his CGM it may be beneficial to stop the glimepiride.  Plan: -Discontinue glimepiride -Continue Jardiance 25 mg daily  -Continue metformin 2000 mg daily -Continue Ozempic 0.5 mg daily -Return to clinic in 1 week for CGM download -Follow-up with Dr. Dwyane Dee December

## 2018-11-16 NOTE — Progress Notes (Signed)
Internal Medicine Clinic Attending  I saw and evaluated the patient.  I personally confirmed the key portions of the history and exam documented by Dr. Sherry Ruffing and I reviewed pertinent patient test results.  The assessment, diagnosis, and plan were formulated together and I agree with the documentation in the resident's note.   I personally reviewed the CGM data, the resident's interpretation, and agree with the intervention. however it is important to remember that the freestyle CGM can report false hypoglycemia, however given that he is having some symptoms around the same time I discussed that it is reasonably to stop the Amaryl and reevaluate in 1 week.

## 2018-11-16 NOTE — Assessment & Plan Note (Signed)
Patient is currently on losartan 50 mg daily, his blood pressure today is 114/82.  He denies any issues taking the medication and denies any side effects.  Plan: -Continue losartan 50 mg daily

## 2018-11-22 ENCOUNTER — Ambulatory Visit: Payer: BLUE CROSS/BLUE SHIELD | Admitting: Pharmacist

## 2018-11-22 ENCOUNTER — Encounter: Payer: Self-pay | Admitting: Internal Medicine

## 2018-11-22 ENCOUNTER — Other Ambulatory Visit: Payer: Self-pay | Admitting: Endocrinology

## 2018-11-22 ENCOUNTER — Ambulatory Visit (INDEPENDENT_AMBULATORY_CARE_PROVIDER_SITE_OTHER): Payer: BLUE CROSS/BLUE SHIELD | Admitting: Internal Medicine

## 2018-11-22 ENCOUNTER — Other Ambulatory Visit: Payer: Self-pay

## 2018-11-22 VITALS — BP 135/85 | HR 92 | Temp 98.7°F | Wt 307.8 lb

## 2018-11-22 DIAGNOSIS — Z7984 Long term (current) use of oral hypoglycemic drugs: Secondary | ICD-10-CM

## 2018-11-22 DIAGNOSIS — E1142 Type 2 diabetes mellitus with diabetic polyneuropathy: Secondary | ICD-10-CM | POA: Diagnosis not present

## 2018-11-22 NOTE — Progress Notes (Signed)
   CC: F/U DM  HPI:  [name] is a 57 y.o. who presented for continued evaluation and management of his DM. He has completed a trial of CGM monitoring. For further evaluation and management please refer to problem based charting.   Past Medical History:  Diagnosis Date  . Depression   . Diabetes mellitus without complication ([place])   . Hyperlipidemia   . Hypertension   . Ischemic colitis ([place])    Review of Systems:  12 point ROS preformed. All negative aside from those mentioned in the HPI.  Physical Exam: Vitals:   11/22/18 0832  BP: 135/85  Pulse: 92  Temp: 98.7 F (37.1 C)  TempSrc: Oral  SpO2: 95%  Weight: (!) 307 lb 12.8 oz (139.6 kg)   General: Obese male in no acute distress Pulm: Good air movement with no wheezing or crackles  CV: RRR, no murmurs, no rubs  Abdomen: Active bowel sounds, soft, non-distended, no tenderness to palpation   Assessment & Plan:   See Encounters Tab for problem based charting.  Patient discussed with Dr. Lynnae January

## 2018-11-22 NOTE — Progress Notes (Signed)
Internal Medicine Clinic Attending  Case discussed with Dr. Helberg at the time of the visit.  We reviewed the resident's history and exam and pertinent patient test results.  I agree with the assessment, diagnosis, and plan of care documented in the resident's note.  I personally reviewed the CGM data, the resident's interpretation, and agree with the intervention.  

## 2018-11-22 NOTE — Patient Instructions (Addendum)
Thank you for allowing [place] to provide your care. Please continue all your medications as prescribed. If you have any questions or concerns please do not hesitate to call.   Work on increasing your physical activity (120/week) and continue to limits your consumption of high sugar/carbs.   We will see you back in 3 months or sooner if anything arises.   Diabetes Mellitus and Exercise Exercising regularly is important for your overall health, especially when you have diabetes (diabetes mellitus). Exercising is not only about losing weight. It has many health benefits, such as increasing muscle strength and bone density and reducing body fat and stress. This leads to improved fitness, flexibility, and endurance, all of which result in better overall health. Exercise has additional benefits for people with diabetes, including:  Reducing appetite.  Helping to lower and control blood glucose.  Lowering blood pressure.  Helping to control amounts of fatty substances (lipids) in the blood, such as cholesterol and triglycerides.  Helping the body to respond better to insulin (improving insulin sensitivity).  Reducing how much insulin the body needs.  Decreasing the risk for heart disease by: ? Lowering cholesterol and triglyceride levels. ? Increasing the levels of good cholesterol. ? Lowering blood glucose levels.  What is my activity plan? Your health care provider or certified diabetes educator can help you make a plan for the type and frequency of exercise (activity plan) that works for you. Make sure that you:  Do at least 150 minutes of moderate-intensity or vigorous-intensity exercise each week. This could be brisk walking, biking, or water aerobics. ? Do stretching and strength exercises, such as yoga or weightlifting, at least 2 times a week. ? Spread out your activity over at least 3 days of the week.  Get some form of physical activity every day. ? Do not go more than 2 days in a  row without some kind of physical activity. ? Avoid being inactive for more than 90 minutes at a time. Take frequent breaks to walk or stretch.  Choose a type of exercise or activity that you enjoy, and set realistic goals.  Start slowly, and gradually increase the intensity of your exercise over time.  What do I need to know about managing my diabetes?  Check your blood glucose before and after exercising. ? If your blood glucose is higher than 240 mg/dL (13.3 mmol/L) before you exercise, check your urine for ketones. If you have ketones in your urine, do not exercise until your blood glucose returns to normal.  Know the symptoms of low blood glucose (hypoglycemia) and how to treat it. Your risk for hypoglycemia increases during and after exercise. Common symptoms of hypoglycemia can include: ? Hunger. ? Anxiety. ? Sweating and feeling clammy. ? Confusion. ? Dizziness or feeling light-headed. ? Increased heart rate or palpitations. ? Blurry vision. ? Tingling or numbness around the mouth, lips, or tongue. ? Tremors or shakes. ? Irritability.  Keep a rapid-acting carbohydrate snack available before, during, and after exercise to help prevent or treat hypoglycemia.  Avoid injecting insulin into areas of the body that are going to be exercised. For example, avoid injecting insulin into: ? The arms, when playing tennis. ? The legs, when jogging.  Keep records of your exercise habits. Doing this can help you and your health care provider adjust your diabetes management plan as needed. Write down: ? Food that you eat before and after you exercise. ? Blood glucose levels before and after you exercise. ? The type  and amount of exercise you have done. ? When your insulin is expected to peak, if you use insulin. Avoid exercising at times when your insulin is peaking.  When you start a new exercise or activity, work with your health care provider to make sure the activity is safe for you,  and to adjust your insulin, medicines, or food intake as needed.  Drink plenty of water while you exercise to prevent dehydration or heat stroke. Drink enough fluid to keep your urine clear or pale yellow. This information is not intended to replace advice given to you by your health care provider. Make sure you discuss any questions you have with your health care provider. Document Released: 02/27/2004 Document Revised: 06/26/2016 Document Reviewed: 05/18/2016 Elsevier Interactive Patient Education  2018 [employer].  Diabetes Mellitus and Nutrition When you have diabetes (diabetes mellitus), it is very important to have healthy eating habits because your blood sugar (glucose) levels are greatly affected by what you eat and drink. Eating healthy foods in the appropriate amounts, at about the same times every day, can help you:  Control your blood glucose.  Lower your risk of heart disease.  Improve your blood pressure.  Reach or maintain a healthy weight.  Every person with diabetes is different, and each person has different needs for a meal plan. Your health care provider may recommend that you work with a diet and nutrition specialist (dietitian) to make a meal plan that is best for you. Your meal plan may vary depending on factors such as:  The calories you need.  The medicines you take.  Your weight.  Your blood glucose, blood pressure, and cholesterol levels.  Your activity level.  Other health conditions you have, such as heart or kidney disease.  How do carbohydrates affect me? Carbohydrates affect your blood glucose level more than any other type of food. Eating carbohydrates naturally increases the amount of glucose in your blood. Carbohydrate counting is a method for keeping track of how many carbohydrates you eat. Counting carbohydrates is important to keep your blood glucose at a healthy level, especially if you use insulin or take certain oral diabetes medicines. It  is important to know how many carbohydrates you can safely have in each meal. This is different for every person. Your dietitian can help you calculate how many carbohydrates you should have at each meal and for snack. Foods that contain carbohydrates include:  Bread, cereal, rice, pasta, and crackers.  Potatoes and corn.  Peas, beans, and lentils.  Milk and yogurt.  Fruit and juice.  Desserts, such as cakes, cookies, ice cream, and candy.  How does alcohol affect me? Alcohol can cause a sudden decrease in blood glucose (hypoglycemia), especially if you use insulin or take certain oral diabetes medicines. Hypoglycemia can be a life-threatening condition. Symptoms of hypoglycemia (sleepiness, dizziness, and confusion) are similar to symptoms of having too much alcohol. If your health care provider says that alcohol is safe for you, follow these guidelines:  Limit alcohol intake to no more than 1 drink per day for nonpregnant women and 2 drinks per day for men. One drink equals 12 oz of beer, 5 oz of wine, or 1 oz of hard liquor.  Do not drink on an empty stomach.  Keep yourself hydrated with water, diet soda, or unsweetened iced tea.  Keep in mind that regular soda, juice, and other mixers may contain a lot of sugar and must be counted as carbohydrates.  What are tips for  following this plan? Reading food labels  Start by checking the serving size on the label. The amount of calories, carbohydrates, fats, and other nutrients listed on the label are based on one serving of the food. Many foods contain more than one serving per package.  Check the total grams (g) of carbohydrates in one serving. You can calculate the number of servings of carbohydrates in one serving by dividing the total carbohydrates by 15. For example, if a food has 30 g of total carbohydrates, it would be equal to 2 servings of carbohydrates.  Check the number of grams (g) of saturated and trans fats in one  serving. Choose foods that have low or no amount of these fats.  Check the number of milligrams (mg) of sodium in one serving. Most people should limit total sodium intake to less than 2,300 mg per day.  Always check the nutrition information of foods labeled as "low-fat" or "nonfat". These foods may be higher in added sugar or refined carbohydrates and should be avoided.  Talk to your dietitian to identify your daily goals for nutrients listed on the label. Shopping  Avoid buying canned, premade, or processed foods. These foods tend to be high in fat, sodium, and added sugar.  Shop around the outside edge of the grocery store. This includes fresh fruits and vegetables, bulk grains, fresh meats, and fresh dairy. Cooking  Use low-heat cooking methods, such as baking, instead of high-heat cooking methods like deep frying.  Cook using healthy oils, such as olive, canola, or sunflower oil.  Avoid cooking with butter, cream, or high-fat meats. Meal planning  Eat meals and snacks regularly, preferably at the same times every day. Avoid going long periods of time without eating.  Eat foods high in fiber, such as fresh fruits, vegetables, beans, and whole grains. Talk to your dietitian about how many servings of carbohydrates you can eat at each meal.  Eat 4-6 ounces of lean protein each day, such as lean meat, chicken, fish, eggs, or tofu. 1 ounce is equal to 1 ounce of meat, chicken, or fish, 1 egg, or 1/4 cup of tofu.  Eat some foods each day that contain healthy fats, such as avocado, nuts, seeds, and fish. Lifestyle   Check your blood glucose regularly.  Exercise at least 30 minutes 5 or more days each week, or as told by your health care provider.  Take medicines as told by your health care provider.  Do not use any products that contain nicotine or tobacco, such as cigarettes and e-cigarettes. If you need help quitting, ask your health care provider.  Work with a Social worker or  diabetes educator to identify strategies to manage stress and any emotional and social challenges. What are some questions to ask my health care provider?  Do I need to meet with a diabetes educator?  Do I need to meet with a dietitian?  What number can I call if I have questions?  When are the best times to check my blood glucose? Where to find more information:  American Diabetes Association: diabetes.org/food-and-fitness/food  Academy of Nutrition and Dietetics: PokerClues.dk  [employer] of Diabetes and Digestive and Kidney Diseases (NIH): ContactWire.be Summary  A healthy meal plan will help you control your blood glucose and maintain a healthy lifestyle.  Working with a diet and nutrition specialist (dietitian) can help you make a meal plan that is best for you.  Keep in mind that carbohydrates and alcohol have immediate effects on your blood  glucose levels. It is important to count carbohydrates and to use alcohol carefully. This information is not intended to replace advice given to you by your health care provider. Make sure you discuss any questions you have with your health care provider. Document Released: 09/03/2005 Document Revised: 01/11/2017 Document Reviewed: 01/11/2017 Elsevier Interactive Patient Education  [employer].

## 2018-11-22 NOTE — Progress Notes (Signed)
Patient was seen today in a co-visit with Dr. Tarri Abernethy.

## 2018-11-22 NOTE — Assessment & Plan Note (Signed)
Jack Wallace wore the CGM for 14 days. The average reading was 128, % time in target was 92, % time below target was 2, and % time above target was 6. Intervention will be to no changes in medication. This completes his CGM trial and he is not interested in obtaining a CGM.   The above data is skewed by the initial 7 days. At his prior visit he endorse signs and symptoms of hypoglycemia so his glimepiride was discontinued. Since this time he has had no episodes of hypoglycemia and only occasional hyperglycemia that tends to correlate with early morning cortisol spikes.   Plan: - Continue Jardiance 25 mg QD, Metformin 2000 mg QD, and Ozempic 0.5 mg Lurlean Leyden - Encouraged increasing exercise and diet modification. Education material supplied.  - Will follow with endocrinologist

## 2018-11-29 ENCOUNTER — Other Ambulatory Visit: Payer: Self-pay | Admitting: Endocrinology

## 2018-12-06 ENCOUNTER — Other Ambulatory Visit: Payer: Self-pay | Admitting: Endocrinology

## 2018-12-06 DIAGNOSIS — I10 Essential (primary) hypertension: Secondary | ICD-10-CM

## 2018-12-09 ENCOUNTER — Other Ambulatory Visit (INDEPENDENT_AMBULATORY_CARE_PROVIDER_SITE_OTHER): Payer: BLUE CROSS/BLUE SHIELD

## 2018-12-09 ENCOUNTER — Other Ambulatory Visit: Payer: Self-pay | Admitting: Endocrinology

## 2018-12-09 DIAGNOSIS — Z125 Encounter for screening for malignant neoplasm of prostate: Secondary | ICD-10-CM

## 2018-12-09 DIAGNOSIS — E1165 Type 2 diabetes mellitus with hyperglycemia: Secondary | ICD-10-CM

## 2018-12-09 DIAGNOSIS — E7800 Pure hypercholesterolemia, unspecified: Secondary | ICD-10-CM

## 2018-12-09 LAB — HEMOGLOBIN A1C: HEMOGLOBIN A1C: 7.1 % — AB (ref 4.6–6.5)

## 2018-12-09 LAB — COMPREHENSIVE METABOLIC PANEL
ALK PHOS: 50 U/L (ref 39–117)
ALT: 17 U/L (ref 0–53)
AST: 17 U/L (ref 0–37)
Albumin: 4.4 g/dL (ref 3.5–5.2)
BILIRUBIN TOTAL: 0.5 mg/dL (ref 0.2–1.2)
BUN: 15 mg/dL (ref 6–23)
CALCIUM: 9.4 mg/dL (ref 8.4–10.5)
CO2: 29 mEq/L (ref 19–32)
Chloride: 98 mEq/L (ref 96–112)
Creatinine, Ser: 1.07 mg/dL (ref 0.40–1.50)
GFR: 75.38 mL/min (ref >60.00)
GLUCOSE: 81 mg/dL (ref 70–99)
POTASSIUM: 4.1 meq/L (ref 3.5–5.1)
Sodium: 135 mEq/L (ref 135–145)
TOTAL PROTEIN: 7.4 g/dL (ref 6.0–8.3)

## 2018-12-09 LAB — LIPID PANEL
CHOLESTEROL: 164 mg/dL (ref 0–200)
HDL: 40.5 mg/dL (ref >39.00)
NonHDL: 123.84
Total CHOL/HDL Ratio: 4
Triglycerides: 211 mg/dL — ABNORMAL HIGH (ref 0.0–149.0)
VLDL: 42.2 mg/dL — ABNORMAL HIGH (ref 0.0–40.0)

## 2018-12-09 LAB — PSA: PSA: 0.73 ng/mL (ref 0.10–4.00)

## 2018-12-09 LAB — MICROALBUMIN / CREATININE URINE RATIO
CREATININE, U: 30.8 mg/dL
MICROALB/CREAT RATIO: 2.3 mg/g (ref 0.0–30.0)

## 2018-12-09 LAB — LDL CHOLESTEROL, DIRECT: Direct LDL: 108 mg/dL

## 2018-12-11 NOTE — Progress Notes (Signed)
Patient ID: Jack Wallace, male   DOB: 26-Jul-1961, 57 y.o.   MRN: [id number]   Reason for Appointment : Followup of Type 2 Diabetes   History of Present Illness            Diagnosis: Type 2 diabetes mellitus, date of diagnosis: 1993         Past diabetes history: He has been treated with various drugs for his diabetes over the last several years. He has been taking metformin for at least 10 years and has been tolerating this. Over the years he has had additional medications to improve his control. He was also taking Actos previously and not clear if he had any side effects. This was stopped because of fear of long-term effects  In early 2014 he had poor control before he started walking and lost 15 pounds. Subsequently he was getting low blood sugar during the day especially before lunch and sometimes in the afternoon His glipizide was  reduced but even with 5 mg twice a day he was getting hypoglycemia.  This was reduced and Invokana added. Invokana was stopped because of lack of clear benefit in 09/2013 Because of inadequate control he was started on Victoza on his visit in 10/14 in addition to his metformin and glipizide.  Recent history:   Non-insulin hypoglycemic drugs: Metformin Er 1500 mg daily,   Jardiance 25 pm, Amaryl 2 mg hs, Ozempic 0.5 mg weekly  His A1c is again about the same at 7.1, previously 6.9  In 12/2017 he was started on Ozempic instead of Victoza  Current problems, blood sugar patterns and management:  He had a CGM done by the medical clinic and since he had 2% of his readings below 70 and unspecified times his glimepiride was stopped for a week  Although subsequently his blood sugars on the CGM not low and overall fairly good he does appear to have mildly increased blood sugars on his fingersticks  He has lost a significant amount of weight compared to the last few months  However he is not doing much exercise except some walking  He is mostly  eating 2 meals a day and has been seen by the dietitian previously for meal planning  Most of his blood sugars are being done when he comes back from work for doing work before his meals and also some midmorning  He has no side effects recently from taking [place] blood glucose:  1 times a day        Glucometer:  Contour  Blood Glucose readings from meter download:    PRE-MEAL  overnight  a.m.  afternoon Bedtime Overall  Glucose range:  113-175  63-177  98-183    Mean/median:  142  139    135   POST-MEAL PC Breakfast PC Lunch PC Dinner  Glucose range:     Mean/median:   183   160   Meals at work: 1-3 am, at home 10-11 am or 3 PM    Dietician visit: Most recent: 5/19  Wt Readings from Last 3 Encounters:  12/12/18 300 lb (136.1 kg)  11/22/18 (!) 307 lb 12.8 oz (139.6 kg)  11/15/18 (!) 304 lb 1.6 oz (137.9 kg)      Physical activity: exercise: recently some walking       Lab Results  Component Value Date   HGBA1C 7.1 (H) 12/09/2018   HGBA1C 6.9 (H) 08/08/2018   HGBA1C 6.9 (H) 05/06/2018   Lab Results  Component  Value Date   MICROALBUR <0.7 12/09/2018   LDLCALC 86 08/08/2018   CREATININE 1.07 12/09/2018   Lab Results  Component Value Date   FRUCTOSAMINE 294 (H) 02/24/2018   FRUCTOSAMINE 314 (H) 11/25/2017   FRUCTOSAMINE 281 12/02/2016    OTHER active problems: See review of systems   Allergies as of 12/12/2018   No Known Allergies     Medication List       Accurate as of December 12, 2018 11:45 AM. Always use your most recent med list.        Alpha Lipoic Acid 200 MG Caps Take 200 mg by mouth. 2 pills daily   aspirin 81 MG tablet Take 81 mg by mouth daily.   BAYER MICROLET LANCETS lancets Use to check blood sugar two times per day   BIOTENE ORALBALANCE DRY MOUTH Gel Apply 1/2 inch length onto tongue and spread evenly repeat as often as needed Generic okay if available   CONTOUR NEXT EZ MONITOR w/Device Kit Use to check blood sugar  two times per day   CONTOUR NEXT TEST test strip Generic drug:  glucose blood TEST TWICE A DAY   diclofenac 75 MG EC tablet Commonly known as:  VOLTAREN TAKE 1 TABLET BY MOUTH TWICE DAILY WITH FOOD   gabapentin 600 MG tablet Commonly known as:  NEURONTIN TAKE 1 TABLET BY MOUTH THREE TIMES A DAY   JARDIANCE 25 MG Tabs tablet Generic drug:  empagliflozin TAKE 1 TABLET BY MOUTH EVERY DAY   losartan 100 MG tablet Commonly known as:  COZAAR TAKE 1 TABLET BY MOUTH EVERY DAY   Magnesium Oxide 400 (240 Mg) MG Tabs Take 1 tablet by mouth 3 (three) times daily. Take 1 in the morning and 2 in the evening.   metFORMIN 500 MG 24 hr tablet Commonly known as:  GLUCOPHAGE-XR TAKE 4 TABLETS BY MOUTH ONCE DAILY WITH SUPPER   multivitamin with minerals Tabs tablet Take 1 tablet by mouth daily.   ondansetron 4 MG tablet Commonly known as:  ZOFRAN Take 1 tablet (4 mg total) by mouth daily as needed for nausea or vomiting.   OZEMPIC (0.25 OR 0.5 MG/DOSE) 2 MG/1.5ML Sopn Generic drug:  Semaglutide(0.25 or 0.'5MG'$ /DOS) INJECT 0.5 MG SUBCUTANEOUS ONCE WEEKLY   pantoprazole 40 MG tablet Commonly known as:  PROTONIX TAKE 1 TABLET BY MOUTH TWICE A DAY   pravastatin 40 MG tablet Commonly known as:  PRAVACHOL Take 1 tablet (40 mg total) by mouth daily.   sertraline 100 MG tablet Commonly known as:  ZOLOFT take 2 tablets by mouth once daily   vitamin B-12 500 MCG tablet Commonly known as:  CYANOCOBALAMIN Take 500 mcg by mouth daily.   vitamin C 1000 MG tablet Take 1,000 mg by mouth daily.   VITAMIN D PO Take 1 tablet by mouth daily. D3 1000 IU   VITAMIN E PO Take 1 capsule by mouth daily. 400 IU daily       Allergies: No Known Allergies  Past Medical History:  Diagnosis Date  . Depression   . Diabetes mellitus without complication ([place])   . Hyperlipidemia   . Hypertension   . Ischemic colitis [facility])     Past Surgical History:  Procedure Laterality Date  . COLONOSCOPY   06/01/2014  . NASAL SEPTUM SURGERY      Family History  Problem Relation Age of Onset  . Hypertension Mother   . Stroke Father   . Diabetes Maternal Grandfather   . Colon cancer Neg Hx   .  Throat cancer Neg Hx   . Prostate cancer Neg Hx   . Pancreatic cancer Neg Hx   . Heart disease Neg Hx   . Kidney disease Neg Hx   . Liver disease Neg Hx     Social History:  reports that he has never smoked. He has never used smokeless tobacco. He reports that he does not drink alcohol or use drugs.    Review of Systems      HYPERTENSION: Taking 100 mg losartan  Blood pressure appears improved with increasing losartan  Home BP ranges from 120-140/ 80, uses arm cuff    BP Readings from Last 3 Encounters:  12/12/18 122/76  11/22/18 135/85  11/15/18 114/82       Hyperlipidemia: taking pravastatin  40 mg for several years. LDL is    Lab Results  Component Value Date   CHOL 164 12/09/2018   HDL 40.50 12/09/2018   LDLCALC 86 08/08/2018   LDLDIRECT 108.0 12/09/2018   TRIG 211.0 (H) 12/09/2018   CHOLHDL 4 12/09/2018      NEUROPATHY He has had symptoms of neuropathy in feet.   He had burning, has occasional tingling with recently no recurrence  He  takes gabapentin 600 mg tid with generally good relief     Physical Examination:  BP 122/76 (BP Location: Left Arm, Patient Position: Sitting, Cuff Size: Normal)   Pulse 98   Ht 6' (1.829 m)   Wt 300 lb (136.1 kg)   SpO2 98%   BMI 40.69 kg/m   No ankle edema present  Diabetic Foot Exam - Simple   Simple Foot Form Diabetic Foot exam was performed with the following findings:  Yes   Visual Inspection No deformities, no ulcerations, no other skin breakdown bilaterally:  Yes See comments:  Yes Sensation Testing Intact to touch and monofilament testing bilaterally:  Yes Pulse Check Posterior Tibialis and Dorsalis pulse intact bilaterally:  Yes See comments:  Yes Comments Minimal callus formation on the distal metatarsal  heads Dorsalis pedis pulses less well palpable      ASSESSMENT/PLAN:   Diabetes type 2 with obesity:  See history of present illness for detailed discussion of current diabetes management, blood sugar patterns and problems identified  The A1c is 7% and about the same  Although he has not exercise he appears to have lost a little weight and likely doing somewhat better with diet Also continue to benefit from Ozempic  His glimepiride has been gradually reduced He still continues to take 2 mg however even though he was told by his PCP to stop this However his blood sugars are at home not significantly low and may be as high as 183 postprandially  Recommend that he try taking only half a tablet of the Amaryl to give him 1 mg Also need to monitor more readings after meals even at home  Hypercholesterolemia: Not well controlled with LDL over 100 May not have been consistent diet but for cardiovascular protection should anyway have him go up to 80 mg pravastatin  NEUROPATHY: Has only minimal symptoms currently    HYPERTENSION: Controlled on losartan and has no microalbuminuria    Patient Instructions  Glimeperide 1/2 pill at 10 pm  Check blood sugars on waking up days a week  Also check blood sugars about 2 hours after meals and do this after different meals by rotation  Recommended blood sugar levels on waking up are 90-130 and about 2 hours after meal is 130-160  Please bring your  blood sugar monitor to each visit, thank you  More sugars after meals at home           Elayne Snare 12/12/2018, 11:45 AM

## 2018-12-12 ENCOUNTER — Ambulatory Visit: Payer: BLUE CROSS/BLUE SHIELD | Admitting: Endocrinology

## 2018-12-12 ENCOUNTER — Encounter: Payer: Self-pay | Admitting: Endocrinology

## 2018-12-12 VITALS — BP 122/76 | HR 98 | Ht 72.0 in | Wt 300.0 lb

## 2018-12-12 DIAGNOSIS — E1142 Type 2 diabetes mellitus with diabetic polyneuropathy: Secondary | ICD-10-CM | POA: Diagnosis not present

## 2018-12-12 DIAGNOSIS — E1165 Type 2 diabetes mellitus with hyperglycemia: Secondary | ICD-10-CM

## 2018-12-12 DIAGNOSIS — E7800 Pure hypercholesterolemia, unspecified: Secondary | ICD-10-CM

## 2018-12-12 MED ORDER — PRAVASTATIN SODIUM 80 MG PO TABS: 80.0000 mg | ORAL_TABLET | Freq: Every day | ORAL | 3 refills | Status: DC

## 2018-12-12 NOTE — Patient Instructions (Addendum)
Glimeperide 1/2 pill at 10 pm  Check blood sugars on waking up days a week  Also check blood sugars about 2 hours after meals and do this after different meals by rotation  Recommended blood sugar levels on waking up are 90-130 and about 2 hours after meal is 130-160  Please bring your blood sugar monitor to each visit, thank you  More sugars after meals at home

## 2018-12-19 ENCOUNTER — Other Ambulatory Visit: Payer: Self-pay | Admitting: Endocrinology

## 2018-12-22 ENCOUNTER — Other Ambulatory Visit: Payer: Self-pay

## 2018-12-22 ENCOUNTER — Telehealth: Payer: Self-pay | Admitting: Endocrinology

## 2018-12-22 MED ORDER — GLUCOSE BLOOD VI STRP: ORAL_STRIP | 0 refills | Status: DC

## 2018-12-22 NOTE — Telephone Encounter (Signed)
error 

## 2018-12-27 ENCOUNTER — Other Ambulatory Visit: Payer: Self-pay | Admitting: Endocrinology

## 2018-12-27 ENCOUNTER — Other Ambulatory Visit: Payer: Self-pay | Admitting: Internal Medicine

## 2018-12-29 ENCOUNTER — Other Ambulatory Visit: Payer: Self-pay

## 2018-12-29 NOTE — Telephone Encounter (Signed)
pantoprazole (PROTONIX) 40 MG tablet, refill request @  [facility] [phone] [address], [place] [address] [place] [phone] (Phone) [phone] (Fax)   Pt states the pharmacy is still waiting for the clinic to reply back.

## 2018-12-29 NOTE — Telephone Encounter (Signed)
Protonix refill was denied by pcp on 12/28/17 as  "pt needs an appointment".  Pt is currently scheduled to see pcp on 02/22/2019.  Will forward info to MD-

## 2019-01-05 ENCOUNTER — Other Ambulatory Visit: Payer: Self-pay | Admitting: Endocrinology

## 2019-01-05 NOTE — Telephone Encounter (Signed)
Ok to refill please advise 

## 2019-01-05 NOTE — Telephone Encounter (Signed)
Please refill if it has been prescribed here before

## 2019-01-15 ENCOUNTER — Other Ambulatory Visit: Payer: Self-pay | Admitting: Endocrinology

## 2019-01-17 ENCOUNTER — Other Ambulatory Visit: Payer: Self-pay

## 2019-01-17 ENCOUNTER — Ambulatory Visit (INDEPENDENT_AMBULATORY_CARE_PROVIDER_SITE_OTHER): Payer: BLUE CROSS/BLUE SHIELD | Admitting: Internal Medicine

## 2019-01-17 ENCOUNTER — Encounter: Payer: Self-pay | Admitting: Internal Medicine

## 2019-01-17 VITALS — BP 126/78 | HR 94 | Temp 98.1°F | Ht 72.0 in | Wt 300.1 lb

## 2019-01-17 DIAGNOSIS — K219 Gastro-esophageal reflux disease without esophagitis: Secondary | ICD-10-CM | POA: Diagnosis not present

## 2019-01-17 DIAGNOSIS — Z0000 Encounter for general adult medical examination without abnormal findings: Secondary | ICD-10-CM

## 2019-01-17 DIAGNOSIS — I10 Essential (primary) hypertension: Secondary | ICD-10-CM | POA: Diagnosis not present

## 2019-01-17 DIAGNOSIS — F321 Major depressive disorder, single episode, moderate: Secondary | ICD-10-CM

## 2019-01-17 DIAGNOSIS — Z7984 Long term (current) use of oral hypoglycemic drugs: Secondary | ICD-10-CM

## 2019-01-17 DIAGNOSIS — E1142 Type 2 diabetes mellitus with diabetic polyneuropathy: Secondary | ICD-10-CM

## 2019-01-17 DIAGNOSIS — E1159 Type 2 diabetes mellitus with other circulatory complications: Secondary | ICD-10-CM | POA: Diagnosis not present

## 2019-01-17 DIAGNOSIS — F339 Major depressive disorder, recurrent, unspecified: Secondary | ICD-10-CM

## 2019-01-17 DIAGNOSIS — I152 Hypertension secondary to endocrine disorders: Secondary | ICD-10-CM

## 2019-01-17 DIAGNOSIS — Z79899 Other long term (current) drug therapy: Secondary | ICD-10-CM

## 2019-01-17 MED ORDER — PANTOPRAZOLE SODIUM 40 MG PO TBEC: 40.0000 mg | DELAYED_RELEASE_TABLET | Freq: Two times a day (BID) | ORAL | 1 refills | Status: DC

## 2019-01-17 MED ORDER — SERTRALINE HCL 100 MG PO TABS: 200.0000 mg | ORAL_TABLET | Freq: Every day | ORAL | 0 refills | Status: DC

## 2019-01-17 NOTE — Patient Instructions (Signed)
Thank you for allowing [place] to provide your care today. Today we discussed your diabetes, medications and your blood pressure.   Your blood pressure today is great.  Please keep up the great work and continue her medication as before. Also I did not make any changes to your diabetic medication.  Please take them as instructed before make sure to follow-up with your endocrinologist as scheduled before.  I refilled Pantoprazole and sertraline for you.  Should you have any questions or concerns please call the internal medicine clinic at [phone].    Thank you

## 2019-01-17 NOTE — Progress Notes (Signed)
   CC: DM follow-up  HPI:  [name] is a 57 y.o. male with past medical history listed below, came into the clinic today for follow-up of diabetes and hypertension.  Please refer to problem based charting for further details and assessment and plan.  Past Medical History:  Diagnosis Date  . Depression   . Diabetes mellitus without complication ([place])   . Hyperlipidemia   . Hypertension   . Ischemic colitis ([place])    Review of Systems:  Review of Systems  Cardiovascular: Negative for chest pain.  Gastrointestinal: Positive for heartburn. Negative for abdominal pain, diarrhea and vomiting.  Neurological: Negative for dizziness, loss of consciousness and headaches.     Physical Exam:  Vitals:   01/17/19 0845  BP: 126/78  Pulse: 94  Temp: 98.1 F (36.7 C)  TempSrc: Oral  SpO2: 97%  Weight: (!) 300 lb 1.6 oz (136.1 kg)  Height: 6' (1.829 m)   Physical Exam  Constitutional: Appears well-developed and well-nourished. No distress.  HENT:  Head: Normocephalic and atraumatic.  Eyes: Conjunctivae are normal.  Cardiovascular: Normal rate, regular rhythm and normal heart sounds.  Respiratory: Effort normal and breath sounds normal. No respiratory distress. No wheezes.  GI: Soft. Bowel sounds are normal. No distension. There is no tenderness.  Musculoskeletal: No edema.  Neurological: Is alert.  Skin: Not diaphoretic. No erythema.  Psychiatric: Normal mood and affect. Behavior is normal. Judgment and thought content normal.    Assessment & Plan:   See Encounters Tab for problem based charting.  Patient discussed with Dr. Eppie Gibson.

## 2019-01-19 NOTE — Assessment & Plan Note (Signed)
Statin dose increased to 80 mg QD atlast visit by endocrinologist. Due to with LDL slightly higher than the goal of 100 and considering the fact that, for cardiovascular protection should anyway have him go up to 80 mg pravastatin. No side effect reported. -Ct Paravastatin 80 mg QD

## 2019-01-19 NOTE — Assessment & Plan Note (Addendum)
Blood pressure well controled today. Per Med list and as she endorses,  she takes Losartan 100 mg QD. -Ct Losartan 100 mg QD

## 2019-01-19 NOTE — Assessment & Plan Note (Signed)
Refilled Pantoprazole today.

## 2019-01-19 NOTE — Assessment & Plan Note (Addendum)
Well controled with HbA1c: 7.1 and CBG 128 today. No dizziness or headache at home. Has an upcomming appointment with Dr. Dwyane Dee. Ct current meds: -Continue Metformin 2000 QD --Continue Jardiance 25 mg QD -Continue 1/2 tablet  Glimepride QD (stopped before but he takes in a half) -Ozempic 0.5 mg weekly -F/u with endocrinology -f/u in 3 months

## 2019-01-19 NOTE — Assessment & Plan Note (Signed)
Refilled Sertraline today.

## 2019-01-19 NOTE — Assessment & Plan Note (Deleted)
Refilled Sertraline today.

## 2019-01-20 NOTE — Progress Notes (Signed)
Case discussed with Dr. Myrtie Hawk at the time of the visit.  We reviewed the resident's history and exam and pertinent patient test results.  I agree with the assessment, diagnosis and plan of care documented in the resident's note.

## 2019-01-31 ENCOUNTER — Other Ambulatory Visit: Payer: Self-pay | Admitting: Endocrinology

## 2019-02-07 ENCOUNTER — Other Ambulatory Visit: Payer: Self-pay | Admitting: Endocrinology

## 2019-02-07 DIAGNOSIS — I10 Essential (primary) hypertension: Secondary | ICD-10-CM

## 2019-02-14 ENCOUNTER — Other Ambulatory Visit: Payer: Self-pay | Admitting: Endocrinology

## 2019-02-22 ENCOUNTER — Encounter: Payer: Self-pay | Admitting: Internal Medicine

## 2019-02-22 ENCOUNTER — Ambulatory Visit: Payer: BLUE CROSS/BLUE SHIELD | Admitting: Internal Medicine

## 2019-02-22 ENCOUNTER — Other Ambulatory Visit: Payer: Self-pay

## 2019-02-22 VITALS — BP 141/82 | HR 78 | Temp 98.3°F | Ht 72.0 in | Wt 298.9 lb

## 2019-02-22 DIAGNOSIS — E1159 Type 2 diabetes mellitus with other circulatory complications: Secondary | ICD-10-CM | POA: Diagnosis not present

## 2019-02-22 DIAGNOSIS — F321 Major depressive disorder, single episode, moderate: Secondary | ICD-10-CM | POA: Diagnosis not present

## 2019-02-22 DIAGNOSIS — I152 Hypertension secondary to endocrine disorders: Secondary | ICD-10-CM

## 2019-02-22 DIAGNOSIS — K219 Gastro-esophageal reflux disease without esophagitis: Secondary | ICD-10-CM | POA: Diagnosis not present

## 2019-02-22 DIAGNOSIS — I10 Essential (primary) hypertension: Secondary | ICD-10-CM

## 2019-02-22 DIAGNOSIS — E1142 Type 2 diabetes mellitus with diabetic polyneuropathy: Secondary | ICD-10-CM

## 2019-02-22 LAB — POCT GLYCOSYLATED HEMOGLOBIN (HGB A1C): Hemoglobin A1C: 6.8 % — AB (ref 4.0–5.6)

## 2019-02-22 LAB — GLUCOSE, CAPILLARY: Glucose-Capillary: 100 mg/dL — ABNORMAL HIGH (ref 70–99)

## 2019-02-22 MED ORDER — PANTOPRAZOLE SODIUM 40 MG PO TBEC: 40.0000 mg | DELAYED_RELEASE_TABLET | Freq: Two times a day (BID) | ORAL | 3 refills | Status: DC

## 2019-02-22 MED ORDER — SERTRALINE HCL 100 MG PO TABS: 200.0000 mg | ORAL_TABLET | Freq: Every day | ORAL | 3 refills | Status: DC

## 2019-02-22 NOTE — Assessment & Plan Note (Addendum)
Assessment: Blood pressure above goal today while on losartan 100 mg daily.  Per chart review he has had BP within normal limits during several clinic visits in the past.  If his blood pressure is above goal at the next clinic visit we will consider adding another antihypertensive agent.  Plan: 1.  Continue losartan 100 mg daily

## 2019-02-22 NOTE — Progress Notes (Signed)
   CC: Routine follow-up visit  HPI:  [name] is a 58 y.o. male with type 2 diabetes, HLD, HTN, MDD, and GERD who presents for routine follow-up visit.  He has no acute complaints today.  Hypertension: Currently on losartan 100 mg daily. Reports good compliance with his medication. Blood pressure today 141/82.    Type 2 diabetes: Last A1c on 12/09/2018 was 7.1.  He is currently on metformin 2000 mg daily, Jardiance 25 mg daily, glimepiride 1 mg daily, and Ozempic 0.5 mg weekly.  He is followed by endocrinologist.  A1c today is 6.8.  MDD: Reports stable mood while on sertraline.  Denies homicidal or suicidal ideation.  GERD: Reports improved acid reflux symptoms while on pantoprazole.   Past Medical History:  Diagnosis Date  . Depression   . Diabetes mellitus without complication ([place])   . Hyperlipidemia   . Hypertension   . Ischemic colitis ([place])    Review of Systems:   Review of Systems  Constitutional: Negative for chills and fever.  Respiratory: Negative for cough and shortness of breath.   Cardiovascular: Negative for chest pain and leg swelling.  All other systems reviewed and are negative.   Physical Exam:  Vitals:   02/22/19 1321  BP: (!) 141/82  Pulse: 78  Temp: 98.3 F (36.8 C)  TempSrc: Oral  SpO2: 95%  Weight: 298 lb 14.4 oz (135.6 kg)  Height: 6' (1.829 m)   Physical Exam Vitals signs reviewed.  Constitutional:      Appearance: Normal appearance.  HENT:     Head: Normocephalic and atraumatic.  Cardiovascular:     Rate and Rhythm: Normal rate and regular rhythm.     Pulses: Normal pulses.     Heart sounds: Normal heart sounds.  Pulmonary:     Effort: Pulmonary effort is normal. No respiratory distress.     Breath sounds: Normal breath sounds.  Abdominal:     General: Abdomen is flat.     Palpations: Abdomen is soft.  Musculoskeletal:        General: No tenderness.     Right lower leg: No edema.     Left lower leg: No edema.    Skin:    General: Skin is warm and dry.  Neurological:     Mental Status: He is alert and oriented to person, place, and time. Mental status is at baseline.  Psychiatric:        Mood and Affect: Mood normal.        Behavior: Behavior normal.     Assessment & Plan:   See Encounters Tab for problem based charting.  Patient discussed with Dr. Lynnae January

## 2019-02-22 NOTE — Assessment & Plan Note (Signed)
Assessment/plan: A1c improved today to 6.8 while on metformin 2000 mg daily, Jardiance 25 mg daily, glimepiride 1 mg daily, and Ozempic 0.5 mg weekly.  Recommend continue these medications and following up with endocrinology as directed.

## 2019-02-22 NOTE — Patient Instructions (Addendum)
Thank you so much for allowing me to care for you today.  I have refilled your pantoprazole and sertraline.  If you have any issues before your next clinic visit please call our clinic.

## 2019-02-22 NOTE — Assessment & Plan Note (Signed)
Assessment: Reports improved mood while on sertraline.  He is requesting a refill today.  Plan: 1.  Refilled sertraline mg daily

## 2019-02-22 NOTE — Assessment & Plan Note (Signed)
Assessment: He reports improvement in his acid reflux symptoms while on pantoprazole.  He is requesting a refill today.  Plan: 1.  Refill pantoprazole 40 mg twice daily today.

## 2019-02-23 NOTE — Progress Notes (Signed)
Internal Medicine Clinic Attending  Case discussed with Dr. Alfonse Spruce soon after the resident saw the patient.  We reviewed the resident's history and exam and pertinent patient test results.  I agree with the assessment, diagnosis, and plan of care documented in the resident's note.

## 2019-03-05 ENCOUNTER — Other Ambulatory Visit: Payer: Self-pay | Admitting: Endocrinology

## 2019-03-08 ENCOUNTER — Other Ambulatory Visit: Payer: Self-pay

## 2019-03-08 ENCOUNTER — Other Ambulatory Visit (INDEPENDENT_AMBULATORY_CARE_PROVIDER_SITE_OTHER): Payer: BLUE CROSS/BLUE SHIELD

## 2019-03-08 DIAGNOSIS — E1165 Type 2 diabetes mellitus with hyperglycemia: Secondary | ICD-10-CM

## 2019-03-08 DIAGNOSIS — E7800 Pure hypercholesterolemia, unspecified: Secondary | ICD-10-CM

## 2019-03-08 LAB — COMPREHENSIVE METABOLIC PANEL
ALT: 18 U/L (ref 0–53)
AST: 17 U/L (ref 0–37)
Albumin: 4.7 g/dL (ref 3.5–5.2)
Alkaline Phosphatase: 60 U/L (ref 39–117)
BUN: 20 mg/dL (ref 6–23)
CO2: 29 meq/L (ref 19–32)
Calcium: 10 mg/dL (ref 8.4–10.5)
Chloride: 104 mEq/L (ref 96–112)
Creatinine, Ser: 1.19 mg/dL (ref 0.40–1.50)
GFR: 62.68 mL/min (ref >60.00)
Glucose, Bld: 107 mg/dL — ABNORMAL HIGH (ref 70–99)
Potassium: 4.7 mEq/L (ref 3.5–5.1)
Sodium: 141 mEq/L (ref 135–145)
Total Bilirubin: 0.6 mg/dL (ref 0.2–1.2)
Total Protein: 7.8 g/dL (ref 6.0–8.3)

## 2019-03-08 LAB — LIPID PANEL
Cholesterol: 182 mg/dL (ref 0–200)
HDL: 43.8 mg/dL (ref >39.00)
LDL Cholesterol: 100 mg/dL — ABNORMAL HIGH (ref 0–99)
NONHDL: 137.95
Total CHOL/HDL Ratio: 4
Triglycerides: 188 mg/dL — ABNORMAL HIGH (ref 0.0–149.0)
VLDL: 37.6 mg/dL (ref 0.0–40.0)

## 2019-03-08 LAB — HEMOGLOBIN A1C: Hgb A1c MFr Bld: 7.4 % — ABNORMAL HIGH (ref 4.6–6.5)

## 2019-03-10 ENCOUNTER — Other Ambulatory Visit: Payer: BLUE CROSS/BLUE SHIELD

## 2019-03-14 ENCOUNTER — Other Ambulatory Visit: Payer: Self-pay

## 2019-03-14 NOTE — Progress Notes (Signed)
Patient ID: Jack Wallace, male   DOB: May 27, 1961, 58 y.o.   MRN: [id number]   Reason for Appointment : Followup of Type 2 Diabetes   History of Present Illness            Diagnosis: Type 2 diabetes mellitus, date of diagnosis: 1993         Past diabetes history: He has been treated with various drugs for his diabetes over the last several years. He has been taking metformin for at least 10 years and has been tolerating this. Over the years he has had additional medications to improve his control. He was also taking Actos previously and not clear if he had any side effects. This was stopped because of fear of long-term effects  In early 2014 he had poor control before he started walking and lost 15 pounds. Subsequently he was getting low blood sugar during the day especially before lunch and sometimes in the afternoon His glipizide was  reduced but even with 5 mg twice a day he was getting hypoglycemia.  This was reduced and Invokana added. Invokana was stopped because of lack of clear benefit in 09/2013 Because of inadequate control he was started on Victoza on his visit in 10/14 in addition to his metformin and glipizide.  Recent history:   Non-insulin hypoglycemic drugs: Metformin Er 1500 mg daily,   Jardiance 25 pm, Amaryl 2 mg pm, Ozempic 0.5 mg weekly  His A1c is relatively higher at 7.4 and usually above 7 Not clear why A1c from PCP in the clinic was 1 8  In 12/2017 he was started on Ozempic instead of Victoza  Current problems, blood sugar patterns and management:  He has not had any low normal blood sugars with reducing his Amaryl to 1 mg  However checking blood sugars mostly around his lunchtime at work and not in the evenings usually  He has gained at least 5 pounds  Has not been able to go to the gym because of various reasons and now his gym is low  Does not like to do much walking because of ankle pain  Although he has previously seen the dietitian he  may not be planning his meals well and he thinks he has had limited resources to eat healthy consistently  Highest blood sugars are about 180-190 either before or after lunch  Blood sugars are usually not as high in the mornings are midday, some after meals  Monitors blood glucose:  1 times a day        Glucometer:  Contour  Blood Glucose readings from meter download:    PRE-MEAL Fasting Lunch Dinner Bedtime Overall  Glucose range: ?   128-174     Mean/median:     142   POST-MEAL PC Breakfast PC Lunch PC Dinner  Glucose range:  124-162  125-183   Mean/median:      Previous readings:  PRE-MEAL  overnight  a.m.  afternoon Bedtime Overall  Glucose range:  113-175  63-177  98-183    Mean/median:  142  139    135   POST-MEAL PC Breakfast PC Lunch PC Dinner  Glucose range:     Mean/median:   183   160   Meals at work: 1-3 am, at home 10-11 am or 3 PM   Dietician visit: Most recent: 5/19  Wt Readings from Last 3 Encounters:  03/15/19 (!) 305 lb (138.3 kg)  02/22/19 298 lb 14.4 oz (135.6 kg)  01/17/19 Marland Kitchen)  300 lb 1.6 oz (136.1 kg)      Physical activity: exercise: recently some walking       Lab Results  Component Value Date   HGBA1C 7.4 (H) 03/08/2019   HGBA1C 6.8 (A) 02/22/2019   HGBA1C 7.1 (H) 12/09/2018   Lab Results  Component Value Date   MICROALBUR <0.7 12/09/2018   LDLCALC 100 (H) 03/08/2019   CREATININE 1.19 03/08/2019   Lab Results  Component Value Date   FRUCTOSAMINE 294 (H) 02/24/2018   FRUCTOSAMINE 314 (H) 11/25/2017   FRUCTOSAMINE 281 12/02/2016    OTHER active problems: See review of systems   Allergies as of 03/15/2019   No Known Allergies     Medication List       Accurate as of March 15, 2019  9:01 AM. Always use your most recent med list.        Alpha Lipoic Acid 200 MG Caps Take 200 mg by mouth. 2 pills daily   aspirin 81 MG tablet Take 81 mg by mouth daily.   Bayer Microlet Lancets lancets Use to check blood sugar two times  per day   CONTOUR NEXT EZ MONITOR w/Device Kit Use to check blood sugar two times per day   diclofenac 75 MG EC tablet Commonly known as:  VOLTAREN TAKE 1 TABLET BY MOUTH TWICE DAILY WITH FOOD   gabapentin 600 MG tablet Commonly known as:  NEURONTIN TAKE 1 TABLET BY MOUTH THREE TIMES DAILY   glucose blood test strip Commonly known as:  Contour Next Test TEST TWICE A DAY   Jardiance 25 MG Tabs tablet Generic drug:  empagliflozin TAKE 1 TABLET BY MOUTH EVERY DAY   losartan 100 MG tablet Commonly known as:  COZAAR TAKE 1 TABLET BY MOUTH EVERY DAY   Magnesium Oxide 400 (240 Mg) MG Tabs Take 1 tablet by mouth 3 (three) times daily. Take 1 in the morning and 2 in the evening.   metFORMIN 500 MG 24 hr tablet Commonly known as:  GLUCOPHAGE-XR TAKE 4 TABLETS BY MOUTH EVERY DAY WITH SUPPER   multivitamin with minerals Tabs tablet Take 1 tablet by mouth daily.   Ozempic (0.25 or 0.5 MG/DOSE) 2 MG/1.5ML Sopn Generic drug:  Semaglutide(0.25 or 0.5MG/DOS) INJECT 0.5 MG SUBCUTANEOUS ONCE WEEKLY   pantoprazole 40 MG tablet Commonly known as:  PROTONIX Take 1 tablet (40 mg total) by mouth 2 (two) times daily.   pravastatin 80 MG tablet Commonly known as:  PRAVACHOL Take 1 tablet (80 mg total) by mouth daily.   sertraline 100 MG tablet Commonly known as:  ZOLOFT Take 2 tablets (200 mg total) by mouth daily.   vitamin B-12 500 MCG tablet Commonly known as:  CYANOCOBALAMIN Take 500 mcg by mouth daily.   vitamin C 1000 MG tablet Take 1,000 mg by mouth daily.   VITAMIN D PO Take 1 tablet by mouth daily. D3 1000 IU   VITAMIN E PO Take 1 capsule by mouth daily. 400 IU daily       Allergies: No Known Allergies  Past Medical History:  Diagnosis Date  . Depression   . Diabetes mellitus without complication ([place])   . Hyperlipidemia   . Hypertension   . Ischemic colitis [facility])     Past Surgical History:  Procedure Laterality Date  . COLONOSCOPY  06/01/2014  . NASAL  SEPTUM SURGERY      Family History  Problem Relation Age of Onset  . Hypertension Mother   . Stroke Father   .  Diabetes Maternal Grandfather   . Colon cancer Neg Hx   . Throat cancer Neg Hx   . Prostate cancer Neg Hx   . Pancreatic cancer Neg Hx   . Heart disease Neg Hx   . Kidney disease Neg Hx   . Liver disease Neg Hx     Social History:  reports that he has never smoked. He has never used smokeless tobacco. He reports that he does not drink alcohol or use drugs.    Review of Systems      HYPERTENSION: Taking 100 mg losartan  Blood pressure appears well-controlled when seen in the office here  Home BP ranges from 130-140/ 80 +, uses arm cuff    BP Readings from Last 3 Encounters:  03/15/19 128/82  02/22/19 (!) 141/82  01/17/19 126/78       Hyperlipidemia: taking pravastatin  80 mg, previously on 40 LDL is 100   Lab Results  Component Value Date   CHOL 182 03/08/2019   HDL 43.80 03/08/2019   LDLCALC 100 (H) 03/08/2019   LDLDIRECT 108.0 12/09/2018   TRIG 188.0 (H) 03/08/2019   CHOLHDL 4 03/08/2019     Lab Results  Component Value Date   ALT 18 03/08/2019     NEUROPATHY He has had symptoms of neuropathy in feet.   He had burning, has occasional tingling with recently no recurrence  He  takes gabapentin 600 mg tid with generally good relief     Physical Examination:  BP 128/82 (BP Location: Left Arm, Patient Position: Sitting, Cuff Size: Large)   Pulse 83   Temp 98.1 F (36.7 C) (Oral)   Ht 6' (1.829 m)   Wt (!) 305 lb (138.3 kg)   SpO2 95%   BMI 41.37 kg/m       ASSESSMENT/PLAN:   Diabetes type 2 with obesity:  See history of present illness for detailed discussion of current diabetes management, blood sugar patterns and problems identified  The A1c is 7.4% and relatively higher  His sugars may be somewhat higher because of reducing his Amaryl but previously was having low normal readings with this His main difficulties not losing  weight He is likely not doing well with his diet consistently with some readings at home near 200  He will likely benefit from increasing the Ozempic to 1 mg For now he will try to take 2 injections of the 0.5 that he has at home and then if tolerated call us for the 1 mg prescription Reminded him to cut back on high fat and high carbohydrate meals Also needs to walk or do other exercises regularly  Follow-up in 4 months  Hypercholesterolemia: Slightly better with increasing pravastatin to 80 mg ALT normal  NEUROPATHY: Has only minimal symptoms currently   HYPERTENSION: Controlled on losartan    Patient Instructions  Cut back on hi fats and carbs  Check blood sugars on waking up days a week  Also check blood sugars about 2 hours after meals and do this after different meals by rotation  Recommended blood sugar levels on waking up are 90-130 and about 2 hours after meal is 130-160  Please bring your blood sugar monitor to each visit, thank you  Try 2 shots of Ozempic 0.5 and call if tolerated  Walk daily        Ajay Kumar 03/15/2019, 9:01 AM

## 2019-03-15 ENCOUNTER — Ambulatory Visit: Payer: BLUE CROSS/BLUE SHIELD | Admitting: Endocrinology

## 2019-03-15 ENCOUNTER — Encounter: Payer: Self-pay | Admitting: Endocrinology

## 2019-03-15 ENCOUNTER — Other Ambulatory Visit: Payer: Self-pay

## 2019-03-15 VITALS — BP 128/82 | HR 83 | Temp 98.1°F | Ht 72.0 in | Wt 305.0 lb

## 2019-03-15 DIAGNOSIS — E1165 Type 2 diabetes mellitus with hyperglycemia: Secondary | ICD-10-CM | POA: Diagnosis not present

## 2019-03-15 DIAGNOSIS — E7800 Pure hypercholesterolemia, unspecified: Secondary | ICD-10-CM | POA: Diagnosis not present

## 2019-03-15 NOTE — Patient Instructions (Signed)
Cut back on hi fats and carbs  Check blood sugars on waking up days a week  Also check blood sugars about 2 hours after meals and do this after different meals by rotation  Recommended blood sugar levels on waking up are 90-130 and about 2 hours after meal is 130-160  Please bring your blood sugar monitor to each visit, thank you  Try 2 shots of Ozempic 0.5 and call if tolerated  Walk daily

## 2019-03-18 ENCOUNTER — Other Ambulatory Visit: Payer: Self-pay | Admitting: Endocrinology

## 2019-03-22 ENCOUNTER — Other Ambulatory Visit: Payer: Self-pay

## 2019-03-22 MED ORDER — SEMAGLUTIDE (1 MG/DOSE) 2 MG/1.5ML ~~LOC~~ SOPN: 1.0000 mg | PEN_INJECTOR | SUBCUTANEOUS | 3 refills | Status: DC

## 2019-04-03 ENCOUNTER — Other Ambulatory Visit: Payer: Self-pay | Admitting: Endocrinology

## 2019-04-03 DIAGNOSIS — I10 Essential (primary) hypertension: Secondary | ICD-10-CM

## 2019-04-26 ENCOUNTER — Other Ambulatory Visit: Payer: Self-pay | Admitting: Internal Medicine

## 2019-04-26 DIAGNOSIS — F321 Major depressive disorder, single episode, moderate: Secondary | ICD-10-CM

## 2019-05-02 ENCOUNTER — Other Ambulatory Visit: Payer: Self-pay | Admitting: Endocrinology

## 2019-05-10 ENCOUNTER — Other Ambulatory Visit: Payer: Self-pay | Admitting: Endocrinology

## 2019-05-10 DIAGNOSIS — I10 Essential (primary) hypertension: Secondary | ICD-10-CM

## 2019-06-05 ENCOUNTER — Other Ambulatory Visit: Payer: Self-pay | Admitting: Endocrinology

## 2019-06-11 ENCOUNTER — Encounter: Payer: Self-pay | Admitting: *Deleted

## 2019-06-13 ENCOUNTER — Other Ambulatory Visit: Payer: Self-pay

## 2019-06-13 ENCOUNTER — Other Ambulatory Visit (INDEPENDENT_AMBULATORY_CARE_PROVIDER_SITE_OTHER): Payer: BC Managed Care – PPO

## 2019-06-13 DIAGNOSIS — E1165 Type 2 diabetes mellitus with hyperglycemia: Secondary | ICD-10-CM | POA: Diagnosis not present

## 2019-06-13 LAB — HEMOGLOBIN A1C: Hgb A1c MFr Bld: 7.1 % — ABNORMAL HIGH (ref 4.6–6.5)

## 2019-06-13 LAB — BASIC METABOLIC PANEL
BUN: 24 mg/dL — ABNORMAL HIGH (ref 6–23)
CO2: 28 mEq/L (ref 19–32)
Calcium: 9.8 mg/dL (ref 8.4–10.5)
Chloride: 102 mEq/L (ref 96–112)
Creatinine, Ser: 1.25 mg/dL (ref 0.40–1.50)
GFR: 59.17 mL/min — ABNORMAL LOW (ref >60.00)
Glucose, Bld: 101 mg/dL — ABNORMAL HIGH (ref 70–99)
Potassium: 4.9 mEq/L (ref 3.5–5.1)
Sodium: 138 mEq/L (ref 135–145)

## 2019-06-15 ENCOUNTER — Other Ambulatory Visit: Payer: Self-pay

## 2019-06-15 ENCOUNTER — Encounter: Payer: Self-pay | Admitting: Endocrinology

## 2019-06-15 ENCOUNTER — Ambulatory Visit (INDEPENDENT_AMBULATORY_CARE_PROVIDER_SITE_OTHER): Payer: BC Managed Care – PPO | Admitting: Endocrinology

## 2019-06-15 DIAGNOSIS — E1165 Type 2 diabetes mellitus with hyperglycemia: Secondary | ICD-10-CM

## 2019-06-15 DIAGNOSIS — E7800 Pure hypercholesterolemia, unspecified: Secondary | ICD-10-CM

## 2019-06-15 NOTE — Progress Notes (Signed)
Patient ID: Jack Wallace, male   DOB: November 20, 1961, 58 y.o.   MRN: [id number]   Reason for Appointment : Followup of Type 2 Diabetes  Today's office visit was provided via telemedicine using video technique The patient was explained the limitations of evaluation and management by telemedicine and the availability of in person appointments.  The patient understood the limitations and agreed to proceed. Patient also understood that the telehealth visit is billable. . Location of the patient: Patient's home . Location of the provider: Physician office Only the patient and myself were participating in the encounter    History of Present Illness            Diagnosis: Type 2 diabetes mellitus, date of diagnosis: 1993         Past diabetes history: He has been treated with various drugs for his diabetes over the last several years. He has been taking metformin for at least 10 years and has been tolerating this. Over the years he has had additional medications to improve his control. He was also taking Actos previously and not clear if he had any side effects. This was stopped because of fear of long-term effects  In early 2014 he had poor control before he started walking and lost 15 pounds. Subsequently he was getting low blood sugar during the day especially before lunch and sometimes in the afternoon His glipizide was  reduced but even with 5 mg twice a day he was getting hypoglycemia.  This was reduced and Invokana added. Invokana was stopped because of lack of clear benefit in 09/2013 Because of inadequate control he was started on Victoza on his visit in 10/14 in addition to his metformin and glipizide.  Recent history:   Non-insulin hypoglycemic drugs: Metformin Er 1500 mg daily,   Jardiance 25 pm, Amaryl 2 mg pm, Ozempic 1.0 mg weekly  His A1c is improved down to 7%, previously 7.4  In 12/2017 he was started on Ozempic instead of Victoza Ozempic was increased to 1 mg after  his last visit  Current problems, blood sugar patterns and management:  He has no side effects like nausea with increasing the Ozempic to 1 mg  However it does appear to curb his appetite and his weight at home is about 13 pounds less than before  However despite taking 2 mg Amaryl instead of 1 mg before going to work at 10 PM his blood sugars are usually the highest before his lunch meal at around 130-2 AM  Blood sugars at other times are not consistently checked but are not high and his 8 AM blood sugar was only 101 in the lab  Blood sugars were reviewed by telephone  However checking blood sugars mostly around his lunchtime at work and not in the evenings usually Because of his ankle pain he is not able to do much walking  Monitors blood glucose:  1 times a day        Glucometer:  Contour  Blood Glucose readings from meter download:    PRE-MEAL Fasting Lunch Dinner Bedtime Overall  Glucose range:   149-174     Mean/median:     ?   POST-MEAL PC Breakfast PC Lunch PC Dinner  Glucose range: 114  119  133  Mean/median:       Previous readings:  PRE-MEAL Fasting Lunch Dinner Bedtime Overall  Glucose range: ?   128-174     Mean/median:     142   POST-MEAL PC  Breakfast PC Lunch PC Dinner  Glucose range:  124-162  125-183   Mean/median:       Meals at work: 1-3 am, at home 10-11 am or 3 PM   Dietician visit: Most recent: 5/19  Wt Readings from Last 3 Encounters:  03/15/19 (!) 305 lb (138.3 kg)  02/22/19 298 lb 14.4 oz (135.6 kg)  01/17/19 (!) 300 lb 1.6 oz (136.1 kg)      Physical activity: exercise: recently some walking       Lab Results  Component Value Date   HGBA1C 7.1 (H) 06/13/2019   HGBA1C 7.4 (H) 03/08/2019   HGBA1C 6.8 (A) 02/22/2019   Lab Results  Component Value Date   MICROALBUR <0.7 12/09/2018   LDLCALC 100 (H) 03/08/2019   CREATININE 1.25 06/13/2019   Lab Results  Component Value Date   FRUCTOSAMINE 294 (H) 02/24/2018   FRUCTOSAMINE 314  (H) 11/25/2017   FRUCTOSAMINE 281 12/02/2016    OTHER active problems: See review of systems   Allergies as of 06/15/2019   No Known Allergies     Medication List       Accurate as of June 15, 2019  8:14 AM. If you have any questions, ask your nurse or doctor.        Alpha Lipoic Acid 200 MG Caps Take 200 mg by mouth. 2 pills daily   aspirin 81 MG tablet Take 81 mg by mouth daily.   Bayer Microlet Lancets lancets Use to check blood sugar two times per day   CONTOUR NEXT EZ MONITOR w/Device Kit Use to check blood sugar two times per day   Contour Next Test test strip Generic drug: glucose blood TEST 2 TIMES DAILY   diclofenac 75 MG EC tablet Commonly known as: VOLTAREN TAKE 1 TABLET BY MOUTH TWICE DAILY WITH FOOD   gabapentin 600 MG tablet Commonly known as: NEURONTIN TAKE 1 TABLET BY MOUTH THREE TIMES DAILY   glimepiride 1 MG tablet Commonly known as: AMARYL TAKE 2 TABLETS BY MOUTH AT BEDTIME   Jardiance 25 MG Tabs tablet Generic drug: empagliflozin TAKE 1 TABLET BY MOUTH EVERY DAY   losartan 100 MG tablet Commonly known as: COZAAR TAKE 1 TABLET BY MOUTH EVERY DAY   Magnesium Oxide 400 (240 Mg) MG Tabs Take 1 tablet by mouth 3 (three) times daily. Take 1 in the morning and 2 in the evening.   metFORMIN 500 MG 24 hr tablet Commonly known as: GLUCOPHAGE-XR TAKE 4 TABLETS BY MOUTH EVERY DAY WITH SUPPER   multivitamin with minerals Tabs tablet Take 1 tablet by mouth daily.   pantoprazole 40 MG tablet Commonly known as: PROTONIX Take 1 tablet (40 mg total) by mouth 2 (two) times daily.   pravastatin 80 MG tablet Commonly known as: PRAVACHOL Take 1 tablet (80 mg total) by mouth daily.   Semaglutide (1 MG/DOSE) 2 MG/1.5ML Sopn Commonly known as: Ozempic (1 MG/DOSE) Inject 1 mg into the skin once a week. Inject 45m under the skin once weekly.   sertraline 100 MG tablet Commonly known as: ZOLOFT Take 2 tablets (200 mg total) by mouth daily.    vitamin B-12 500 MCG tablet Commonly known as: CYANOCOBALAMIN Take 500 mcg by mouth daily.   vitamin C 1000 MG tablet Take 1,000 mg by mouth daily.   VITAMIN D PO Take 1 tablet by mouth daily. D3 1000 IU   VITAMIN E PO Take 1 capsule by mouth daily. 400 IU daily  Allergies: No Known Allergies  Past Medical History:  Diagnosis Date  . Depression   . Diabetes mellitus without complication ([place])   . Hyperlipidemia   . Hypertension   . Ischemic colitis [facility])     Past Surgical History:  Procedure Laterality Date  . COLONOSCOPY  06/01/2014  . NASAL SEPTUM SURGERY      Family History  Problem Relation Age of Onset  . Hypertension Mother   . Stroke Father   . Diabetes Maternal Grandfather   . Colon cancer Neg Hx   . Throat cancer Neg Hx   . Prostate cancer Neg Hx   . Pancreatic cancer Neg Hx   . Heart disease Neg Hx   . Kidney disease Neg Hx   . Liver disease Neg Hx     Social History:  reports that he has never smoked. He has never used smokeless tobacco. He reports that he does not drink alcohol or use drugs.    Review of Systems      HYPERTENSION: Taking 100 mg losartan  Has not had blood pressure in the office recently  Home BP ranges from 130-140/ 80-90, uses arm cuff    BP Readings from Last 3 Encounters:  03/15/19 128/82  02/22/19 (!) 141/82  01/17/19 126/78       Hyperlipidemia: taking pravastatin  80 mg, previously on 40 LDL is 100   Lab Results  Component Value Date   CHOL 182 03/08/2019   HDL 43.80 03/08/2019   LDLCALC 100 (H) 03/08/2019   LDLDIRECT 108.0 12/09/2018   TRIG 188.0 (H) 03/08/2019   CHOLHDL 4 03/08/2019     Lab Results  Component Value Date   ALT 18 03/08/2019     NEUROPATHY He has had symptoms of neuropathy in feet.   He had burning, has occasional tingling  He  takes gabapentin 600 mg tid with generally good relief     Physical Examination:  There were no vitals taken for this visit.       ASSESSMENT/PLAN:   Diabetes type 2 with obesity:  See history of present illness for detailed discussion of current diabetes management, blood sugar patterns and problems identified  The A1c is improved at 7, previously 7.4%  He has increased his Ozempic and Amaryl doses and this is helping Also with increased Ozempic he is apparently losing a significant amount of weight This is despite not walking His highest blood sugars appear to be before lunchtime and not clear why since he takes his Amaryl about 3 hours prior to checking Postprandial readings are not consistently high  He will continue the same regimen Hopefully he can get back to exercise at the gym when it opens More readings after meals also and occasionally fasting     HYPERTENSION: He is on losartan, home meter appears to be relatively higher than the office reading    There are no Patient Instructions on file for this visit.       Elayne Snare 06/15/2019, 8:14 AM

## 2019-07-04 ENCOUNTER — Other Ambulatory Visit: Payer: Self-pay | Admitting: Endocrinology

## 2019-07-05 ENCOUNTER — Other Ambulatory Visit: Payer: Self-pay | Admitting: Internal Medicine

## 2019-07-05 DIAGNOSIS — K219 Gastro-esophageal reflux disease without esophagitis: Secondary | ICD-10-CM

## 2019-07-10 ENCOUNTER — Telehealth: Payer: Self-pay | Admitting: Endocrinology

## 2019-07-10 NOTE — Telephone Encounter (Signed)
He did not need labs and would prefer telehealth visit

## 2019-07-10 NOTE — Telephone Encounter (Signed)
Patient has requested to come in for an appointment on Wednesday to talk to the doctor about low blood sugar. Does patient need labs before appointment?

## 2019-07-12 ENCOUNTER — Other Ambulatory Visit: Payer: Self-pay

## 2019-07-12 ENCOUNTER — Ambulatory Visit (INDEPENDENT_AMBULATORY_CARE_PROVIDER_SITE_OTHER): Payer: BC Managed Care – PPO | Admitting: Endocrinology

## 2019-07-12 ENCOUNTER — Encounter: Payer: Self-pay | Admitting: Endocrinology

## 2019-07-12 DIAGNOSIS — E1165 Type 2 diabetes mellitus with hyperglycemia: Secondary | ICD-10-CM

## 2019-07-12 NOTE — Progress Notes (Signed)
Patient ID: Jack Wallace, male   DOB: [birth year], 58 y.o.   MRN: [id number]   Reason for Appointment : Followup of Type 2 Diabetes and blood pressure  Today's office visit was provided via telemedicine using video technique The patient was explained the limitations of evaluation and management by telemedicine and the availability of in person appointments.  The patient understood the limitations and agreed to proceed. Patient also understood that the telehealth visit is billable. . Location of the patient: Patient's home . Location of the provider: Physician office Only the patient and myself were participating in the encounter    History of Present Illness            Diagnosis: Type 2 diabetes mellitus, date of diagnosis: 1993         Past diabetes history: He has been treated with various drugs for his diabetes over the last several years. He has been taking metformin for at least 10 years and has been tolerating this. Over the years he has had additional medications to improve his control. He was also taking Actos previously and not clear if he had any side effects. This was stopped because of fear of long-term effects  In early 2014 he had poor control before he started walking and lost 15 pounds. Subsequently he was getting low blood sugar during the day especially before lunch and sometimes in the afternoon His glipizide was  reduced but even with 5 mg twice a day he was getting hypoglycemia.  This was reduced and Invokana added. Invokana was stopped because of lack of clear benefit in 09/2013 Because of inadequate control he was started on Victoza on his visit in 10/14 in addition to his metformin and glipizide.  Recent history:   Non-insulin hypoglycemic drugs: Metformin Er 1500 mg daily,   Jardiance 25 pm, Amaryl 2 mg pm, Ozempic 1.0 mg weekly  His A1c is 7.1 in 6/20  In 12/2017 he was started on Ozempic instead of Victoza Ozempic was increased to 1 mg after his  last visit  Current problems, blood sugar patterns and management:  He was seen about a month ago and was seen in short-term follow-up because of apparently symptoms of low blood sugars  For some reason he only checks his readings before lunch at work only and no other time  Even though his lowest blood sugar is only 69 he thinks he feels hypoglycemic when blood sugars are below 100  As before he takes Amaryl before he goes to work in the evening around 10 PM and is still taking 2 tablets of the 1 mg  He thinks he has been somewhat more active in the first part of his work routine  Does not have any low blood sugars any other time and some of his blood sugars have been as high as 159 before lunch  No recent labs available Generally has not been able to walk because of ankle pain  Meals at work: 1-3 am, at home 10-11 am or 3 PM  Monitors blood glucose:  1 times a day        Glucometer:  Contour  Blood Glucose readings from meter related by patient:   1-3 AM: 69-190 4 AM = 92  PREVIOUS readings:  PRE-MEAL Fasting Lunch Dinner Bedtime Overall  Glucose range:   149-174     Mean/median:     ?   POST-MEAL PC Breakfast PC Lunch PC Dinner  Glucose range: 114  119  [place] visit: Most recent: 5/19  Wt Readings from Last 3 Encounters:  03/15/19 (!) 305 lb (138.3 kg)  02/22/19 298 lb 14.4 oz (135.6 kg)  01/17/19 (!) 300 lb 1.6 oz (136.1 kg)             Lab Results  Component Value Date   HGBA1C 7.1 (H) 06/13/2019   HGBA1C 7.4 (H) 03/08/2019   HGBA1C 6.8 (A) 02/22/2019   Lab Results  Component Value Date   MICROALBUR <0.7 12/09/2018   LDLCALC 100 (H) 03/08/2019   CREATININE 1.25 06/13/2019   Lab Results  Component Value Date   FRUCTOSAMINE 294 (H) 02/24/2018   FRUCTOSAMINE 314 (H) 11/25/2017   FRUCTOSAMINE 281 12/02/2016    OTHER active problems: See review of systems   Allergies as of 07/12/2019   No Known Allergies      Medication List       Accurate as of July 12, 2019  3:37 PM. If you have any questions, ask your nurse or doctor.        Alpha Lipoic Acid 200 MG Caps Take 200 mg by mouth. 2 pills daily   aspirin 81 MG tablet Take 81 mg by mouth daily.   Bayer Microlet Lancets lancets Use to check blood sugar two times per day   CONTOUR NEXT EZ MONITOR w/Device Kit Use to check blood sugar two times per day   Contour Next Test test strip Generic drug: glucose blood TEST 2 TIMES DAILY   diclofenac 75 MG EC tablet Commonly known as: VOLTAREN TAKE 1 TABLET BY MOUTH TWICE DAILY WITH FOOD   gabapentin 600 MG tablet Commonly known as: NEURONTIN TAKE 1 TABLET BY MOUTH THREE TIMES DAILY   glimepiride 1 MG tablet Commonly known as: AMARYL TAKE 2 TABLETS BY MOUTH AT BEDTIME   Jardiance 25 MG Tabs tablet Generic drug: empagliflozin TAKE 1 TABLET BY MOUTH EVERY DAY   losartan 100 MG tablet Commonly known as: COZAAR TAKE 1 TABLET BY MOUTH EVERY DAY   Magnesium Oxide 400 (240 Mg) MG Tabs Take 1 tablet by mouth 3 (three) times daily. Take 1 in the morning and 2 in the evening.   metFORMIN 500 MG 24 hr tablet Commonly known as: GLUCOPHAGE-XR TAKE 4 TABLETS BY MOUTH EVERY DAY WITH SUPPER   multivitamin with minerals Tabs tablet Take 1 tablet by mouth daily.   pantoprazole 40 MG tablet Commonly known as: PROTONIX Take 1 tablet (40 mg total) by mouth 2 (two) times daily.   pravastatin 80 MG tablet Commonly known as: PRAVACHOL Take 1 tablet (80 mg total) by mouth daily.   Semaglutide (1 MG/DOSE) 2 MG/1.5ML Sopn Commonly known as: Ozempic (1 MG/DOSE) Inject 1 mg into the skin once a week. Inject 43m under the skin once weekly.   sertraline 100 MG tablet Commonly known as: ZOLOFT Take 2 tablets (200 mg total) by mouth daily.   vitamin B-12 500 MCG tablet Commonly known as: CYANOCOBALAMIN Take 500 mcg by mouth daily.   vitamin C 1000 MG tablet Take 1,000 mg by mouth daily.    VITAMIN D PO Take 1 tablet by mouth daily. D3 1000 IU   VITAMIN E PO Take 1 capsule by mouth daily. 400 IU daily       Allergies: No Known Allergies  Past Medical History:  Diagnosis Date  . Depression   . Diabetes mellitus without complication ([place]   . Hyperlipidemia   .  Hypertension   . Ischemic colitis [facility])     Past Surgical History:  Procedure Laterality Date  . COLONOSCOPY  06/01/2014  . NASAL SEPTUM SURGERY      Family History  Problem Relation Age of Onset  . Hypertension Mother   . Stroke Father   . Diabetes Maternal Grandfather   . Colon cancer Neg Hx   . Throat cancer Neg Hx   . Prostate cancer Neg Hx   . Pancreatic cancer Neg Hx   . Heart disease Neg Hx   . Kidney disease Neg Hx   . Liver disease Neg Hx     Social History:  reports that he has never smoked. He has never used smokeless tobacco. He reports that he does not drink alcohol or use drugs.    Review of Systems      HYPERTENSION: Taking 100 mg losartan More recently has had occasional episodes of lightheadedness, dizziness and this can happen at work He is taking his meter to work and checking his blood sugar even when symptomatic  He says his blood pressure has been as low as 80/60 when he gets dizzy Otherwise blood pressure may be normal as before Taking losartan in the evening before he goes to work and he says that he is working in a hot environment    BP Readings from Last 3 Encounters:  03/15/19 128/82  02/22/19 (!) 141/82  01/17/19 126/78       Hyperlipidemia: taking pravastatin  80 mg, previously on 40 LDL is 100   Lab Results  Component Value Date   CHOL 182 03/08/2019   HDL 43.80 03/08/2019   LDLCALC 100 (H) 03/08/2019   LDLDIRECT 108.0 12/09/2018   TRIG 188.0 (H) 03/08/2019   CHOLHDL 4 03/08/2019     Lab Results  Component Value Date   ALT 18 03/08/2019     NEUROPATHY He has had symptoms of neuropathy in feet.   He had burning, has occasional tingling   He  takes gabapentin 600 mg tid with generally good relief     Physical Examination:  There were no vitals taken for this visit.      ASSESSMENT/PLAN:   Diabetes type 2 with obesity:  See history of present illness for detailed discussion of current diabetes management, blood sugar patterns and problems identified  Last A1c 7.1  Although no changes in medications were made in June when he last came for the office visit he is reporting low sugar symptoms before lunch at work periodically This may be partly related to increased activity but also continued improvement in his blood sugar control with Ozempic in higher doses  Discussed that he can try taking his Amaryl right before eating lunch at work and not earlier in the evening to see if this helps Otherwise he can cut back the Amaryl to 1 mg only  HYPERTENSION: He is experiencing low blood pressure symptoms on losartan 100 mg and may be precipitated by increased sweating at work Patient has been as low as 80/60 For this reason he will need to reduce his losartan to half a tablet and instead of taking it before going to work he will take it when he comes back from work  Follow-up as scheduled   There are no Patient Instructions on file for this visit.       Elayne Snare 07/12/2019, 3:37 PM

## 2019-07-16 ENCOUNTER — Other Ambulatory Visit: Payer: Self-pay | Admitting: Endocrinology

## 2019-07-31 ENCOUNTER — Other Ambulatory Visit: Payer: Self-pay | Admitting: Endocrinology

## 2019-09-05 ENCOUNTER — Other Ambulatory Visit: Payer: Self-pay | Admitting: Endocrinology

## 2019-09-11 ENCOUNTER — Other Ambulatory Visit: Payer: Self-pay

## 2019-09-11 ENCOUNTER — Other Ambulatory Visit (INDEPENDENT_AMBULATORY_CARE_PROVIDER_SITE_OTHER): Payer: BC Managed Care – PPO

## 2019-09-11 DIAGNOSIS — E1165 Type 2 diabetes mellitus with hyperglycemia: Secondary | ICD-10-CM | POA: Diagnosis not present

## 2019-09-11 DIAGNOSIS — E7800 Pure hypercholesterolemia, unspecified: Secondary | ICD-10-CM

## 2019-09-11 LAB — COMPREHENSIVE METABOLIC PANEL
ALT: 18 U/L (ref 0–53)
AST: 22 U/L (ref 0–37)
Albumin: 4.7 g/dL (ref 3.5–5.2)
Alkaline Phosphatase: 49 U/L (ref 39–117)
BUN: 18 mg/dL (ref 6–23)
CO2: 29 mEq/L (ref 19–32)
Calcium: 10.2 mg/dL (ref 8.4–10.5)
Chloride: 101 mEq/L (ref 96–112)
Creatinine, Ser: 1.1 mg/dL (ref 0.40–1.50)
GFR: 68.52 mL/min (ref >60.00)
Glucose, Bld: 73 mg/dL (ref 70–99)
Potassium: 4.3 mEq/L (ref 3.5–5.1)
Sodium: 138 mEq/L (ref 135–145)
Total Bilirubin: 0.6 mg/dL (ref 0.2–1.2)
Total Protein: 7.6 g/dL (ref 6.0–8.3)

## 2019-09-11 LAB — LIPID PANEL
Cholesterol: 148 mg/dL (ref 0–200)
HDL: 41.6 mg/dL (ref >39.00)
LDL Cholesterol: 70 mg/dL (ref 0–99)
NonHDL: 106.76
Total CHOL/HDL Ratio: 4
Triglycerides: 183 mg/dL — ABNORMAL HIGH (ref 0.0–149.0)
VLDL: 36.6 mg/dL (ref 0.0–40.0)

## 2019-09-11 LAB — HEMOGLOBIN A1C: Hgb A1c MFr Bld: 7 % — ABNORMAL HIGH (ref 4.6–6.5)

## 2019-09-14 ENCOUNTER — Ambulatory Visit (INDEPENDENT_AMBULATORY_CARE_PROVIDER_SITE_OTHER): Payer: BC Managed Care – PPO | Admitting: Endocrinology

## 2019-09-14 ENCOUNTER — Other Ambulatory Visit: Payer: Self-pay

## 2019-09-14 ENCOUNTER — Encounter: Payer: Self-pay | Admitting: Endocrinology

## 2019-09-14 DIAGNOSIS — E7800 Pure hypercholesterolemia, unspecified: Secondary | ICD-10-CM

## 2019-09-14 DIAGNOSIS — E1142 Type 2 diabetes mellitus with diabetic polyneuropathy: Secondary | ICD-10-CM

## 2019-09-14 NOTE — Progress Notes (Signed)
Patient ID: Jack Wallace, male   DOB: May 12, 1961, 58 y.o.   MRN: [id number]   Reason for Appointment : Followup of Type 2 Diabetes and blood pressure  Today's office visit was provided via telemedicine using video technique The patient was explained the limitations of evaluation and management by telemedicine and the availability of in person appointments.  The patient understood the limitations and agreed to proceed. Patient also understood that the telehealth visit is billable. . Location of the patient: Patient's home . Location of the provider: Physician office Only the patient and myself were participating in the encounter    History of Present Illness            Diagnosis: Type 2 diabetes mellitus, date of diagnosis: 1993         Past diabetes history: He has been treated with various drugs for his diabetes over the last several years. He has been taking metformin for at least 10 years and has been tolerating this. Over the years he has had additional medications to improve his control. He was also taking Actos previously and not clear if he had any side effects. This was stopped because of fear of long-term effects  In early 2014 he had poor control before he started walking and lost 15 pounds. Subsequently he was getting low blood sugar during the day especially before lunch and sometimes in the afternoon His glipizide was  reduced but even with 5 mg twice a day he was getting hypoglycemia.  This was reduced and Invokana added. Invokana was stopped because of lack of clear benefit in 09/2013 Because of inadequate control he was started on Victoza on his visit in 10/14 in addition to his metformin and glipizide. In 12/2017 he was started on Ozempic instead of Victoza  Recent history:   Non-insulin hypoglycemic drugs: Metformin Er 1500 mg daily,  Jardiance 25 in pm, Amaryl 2 mg before lunch, Ozempic 1.0 mg weekly  His A1c is 7.0 and stable   Current problems, blood  sugar patterns and management:  He was seen about 2 months ago and his Amaryl was changed to before lunch instead of at 10 PM  With this he has not had hypoglycemia as much as before  Also he appears to be losing weight which he thinks is from increased physical activity at work  Although previously had difficulty walking because of ankle pain he is able to be more active now  He now weighs 289 pounds apparently  Most of his blood sugars are before and after lunch at work and highest is about 170  Has occasional readings in the evenings which are relatively lower also No side effects with Jardiance and no nausea with Ozempic  Meals at work: 1-3 am, at home 10-11 am or 3 PM  Monitors blood glucose:  1 times a day        Glucometer:  Contour  Blood Glucose readings from meter download   PRE-MEAL  evening Lunch Dinner Bedtime Overall  Glucose range:  101-142  94-169    91-171  Mean/median:  122  140      POST-MEAL PC Breakfast PC Lunch PC Dinner  Glucose range:   113-171   Mean/median:   141     Previous readings:  1-3 AM: 69-190 4 AM = 92     Dietician visit: Most recent: 5/19  Wt Readings from Last 3 Encounters:  03/15/19 (!) 305 lb (138.3 kg)  02/22/19 298 lb 14.4  oz (135.6 kg)  01/17/19 (!) 300 lb 1.6 oz (136.1 kg)             Lab Results  Component Value Date   HGBA1C 7.0 (H) 09/11/2019   HGBA1C 7.1 (H) 06/13/2019   HGBA1C 7.4 (H) 03/08/2019   Lab Results  Component Value Date   MICROALBUR <0.7 12/09/2018   LDLCALC 70 09/11/2019   CREATININE 1.10 09/11/2019   Lab Results  Component Value Date   FRUCTOSAMINE 294 (H) 02/24/2018   FRUCTOSAMINE 314 (H) 11/25/2017   FRUCTOSAMINE 281 12/02/2016    OTHER active problems: See review of systems   Allergies as of 09/14/2019   No Known Allergies     Medication List       Accurate as of September 14, 2019  9:29 AM. If you have any questions, ask your nurse or doctor.        Alpha Lipoic Acid  200 MG Caps Take 200 mg by mouth. 2 pills daily   aspirin 81 MG tablet Take 81 mg by mouth daily.   Bayer Microlet Lancets lancets Use to check blood sugar two times per day   CONTOUR NEXT EZ MONITOR w/Device Kit Use to check blood sugar two times per day   Contour Next Test test strip Generic drug: glucose blood TEST TWICE DAILY   diclofenac 75 MG EC tablet Commonly known as: VOLTAREN TAKE 1 TABLET BY MOUTH TWICE DAILY WITH FOOD   gabapentin 600 MG tablet Commonly known as: NEURONTIN TAKE 1 TABLET BY MOUTH THREE TIMES DAILY   glimepiride 1 MG tablet Commonly known as: AMARYL TAKE 2 TABLETS BY MOUTH AT BEDTIME What changed:   how much to take  how to take this  additional instructions   Jardiance 25 MG Tabs tablet Generic drug: empagliflozin TAKE 1 TABLET BY MOUTH EVERY DAY   losartan 100 MG tablet Commonly known as: COZAAR TAKE 1 TABLET BY MOUTH EVERY DAY   Magnesium Oxide 400 (240 Mg) MG Tabs Take 1 tablet by mouth 3 (three) times daily. Take 1 in the morning and 2 in the evening.   metFORMIN 500 MG 24 hr tablet Commonly known as: GLUCOPHAGE-XR TAKE 4 TABLETS BY MOUTH EVERY DAY WITH SUPPER   multivitamin with minerals Tabs tablet Take 1 tablet by mouth daily.   Ozempic (1 MG/DOSE) 2 MG/1.5ML Sopn Generic drug: Semaglutide (1 MG/DOSE) INJECT 1 MG INTO SKIN ONCE WEEKLY   pantoprazole 40 MG tablet Commonly known as: PROTONIX Take 1 tablet (40 mg total) by mouth 2 (two) times daily.   pravastatin 80 MG tablet Commonly known as: PRAVACHOL Take 1 tablet (80 mg total) by mouth daily.   sertraline 100 MG tablet Commonly known as: ZOLOFT Take 2 tablets (200 mg total) by mouth daily.   vitamin B-12 500 MCG tablet Commonly known as: CYANOCOBALAMIN Take 500 mcg by mouth daily.   vitamin C 1000 MG tablet Take 1,000 mg by mouth daily.   VITAMIN D PO Take 1 tablet by mouth daily. D3 1000 IU   VITAMIN E PO Take 1 capsule by mouth daily. 400 IU daily        Allergies: No Known Allergies  Past Medical History:  Diagnosis Date  . Depression   . Diabetes mellitus without complication ([place])   . Hyperlipidemia   . Hypertension   . Ischemic colitis [facility])     Past Surgical History:  Procedure Laterality Date  . COLONOSCOPY  06/01/2014  . NASAL SEPTUM SURGERY  Family History  Problem Relation Age of Onset  . Hypertension Mother   . Stroke Father   . Diabetes Maternal Grandfather   . Colon cancer Neg Hx   . Throat cancer Neg Hx   . Prostate cancer Neg Hx   . Pancreatic cancer Neg Hx   . Heart disease Neg Hx   . Kidney disease Neg Hx   . Liver disease Neg Hx     Social History:  reports that he has never smoked. He has never used smokeless tobacco. He reports that he does not drink alcohol or use drugs.    Review of Systems      HYPERTENSION: Taking 100 mg losartan In July he was reporting blood pressures as low as 80/60 with symptoms of dizziness He was told to take losartan at lunchtime and usually half tablet but not clear if he understood and has continued to take 100 mg Does not need to work in a hot environment anymore which may be helping  Home blood pressure readings are usually about 130/70, sometimes may have higher readings in physician offices    BP Readings from Last 3 Encounters:  03/15/19 128/82  02/22/19 (!) 141/82  01/17/19 126/78       Hyperlipidemia: taking pravastatin  80 mg LDL is 100   Lab Results  Component Value Date   CHOL 148 09/11/2019   HDL 41.60 09/11/2019   LDLCALC 70 09/11/2019   LDLDIRECT 108.0 12/09/2018   TRIG 183.0 (H) 09/11/2019   CHOLHDL 4 09/11/2019     Lab Results  Component Value Date   ALT 18 09/11/2019     NEUROPATHY He has had symptoms of neuropathy in feet.   He had burning, has occasional tingling  He  takes gabapentin 600 mg tid with usually no breakthrough symptoms     Physical Examination:  There were no vitals taken for this visit.       ASSESSMENT/PLAN:   Diabetes type 2 with obesity:  See history of present illness for detailed discussion of current diabetes management, blood sugar patterns and problems identified   A1c is 7%  His blood sugars are generally well controlled Even though his lab glucose was 73 is not reporting hypoglycemia now Most of his blood sugars are being done before and after lunch at work but not after his main meal at 10 AM since he goes to sleep afterwards  He is losing weight and apparently is walking more because of his work schedule now He will continue the same regimen including 2 mg Amaryl before lunch and Ozempic/Jardiance/metformin as before  HYPERTENSION: He is reporting good blood pressure readings at home However needs to bring monitor for comparison to office meter  Follow-up 3 months  There are no Patient Instructions on file for this visit.       Elayne Snare 09/14/2019, 9:29 AM

## 2019-10-02 ENCOUNTER — Other Ambulatory Visit: Payer: Self-pay | Admitting: Endocrinology

## 2019-10-30 ENCOUNTER — Other Ambulatory Visit: Payer: Self-pay | Admitting: Endocrinology

## 2019-11-02 ENCOUNTER — Other Ambulatory Visit: Payer: Self-pay | Admitting: Endocrinology

## 2019-11-02 DIAGNOSIS — I10 Essential (primary) hypertension: Secondary | ICD-10-CM

## 2019-11-12 ENCOUNTER — Other Ambulatory Visit: Payer: Self-pay | Admitting: Endocrinology

## 2019-11-27 ENCOUNTER — Other Ambulatory Visit: Payer: Self-pay | Admitting: Endocrinology

## 2019-12-25 ENCOUNTER — Other Ambulatory Visit (INDEPENDENT_AMBULATORY_CARE_PROVIDER_SITE_OTHER): Payer: Self-pay

## 2019-12-25 ENCOUNTER — Other Ambulatory Visit: Payer: Self-pay

## 2019-12-25 DIAGNOSIS — E1142 Type 2 diabetes mellitus with diabetic polyneuropathy: Secondary | ICD-10-CM

## 2019-12-25 LAB — COMPREHENSIVE METABOLIC PANEL
ALT: 14 U/L (ref 0–53)
AST: 18 U/L (ref 0–37)
Albumin: 4.7 g/dL (ref 3.5–5.2)
Alkaline Phosphatase: 59 U/L (ref 39–117)
BUN: 21 mg/dL (ref 6–23)
CO2: 28 mEq/L (ref 19–32)
Calcium: 9.7 mg/dL (ref 8.4–10.5)
Chloride: 99 mEq/L (ref 96–112)
Creatinine, Ser: 1.12 mg/dL (ref 0.40–1.50)
GFR: 67.04 mL/min (ref >60.00)
Glucose, Bld: 106 mg/dL — ABNORMAL HIGH (ref 70–99)
Potassium: 4.6 mEq/L (ref 3.5–5.1)
Sodium: 137 mEq/L (ref 135–145)
Total Bilirubin: 0.5 mg/dL (ref 0.2–1.2)
Total Protein: 7.7 g/dL (ref 6.0–8.3)

## 2019-12-25 LAB — MICROALBUMIN / CREATININE URINE RATIO
Creatinine,U: 92.8 mg/dL
Microalb Creat Ratio: 0.8 mg/g (ref 0.0–30.0)
Microalb, Ur: <0.7 mg/dL (ref 0.0–1.9)

## 2019-12-25 LAB — HEMOGLOBIN A1C: Hgb A1c MFr Bld: 7 % — ABNORMAL HIGH (ref 4.6–6.5)

## 2019-12-28 ENCOUNTER — Encounter: Payer: Self-pay | Admitting: Endocrinology

## 2019-12-28 ENCOUNTER — Ambulatory Visit (INDEPENDENT_AMBULATORY_CARE_PROVIDER_SITE_OTHER): Payer: BC Managed Care – PPO | Admitting: Endocrinology

## 2019-12-28 ENCOUNTER — Other Ambulatory Visit: Payer: Self-pay

## 2019-12-28 DIAGNOSIS — I10 Essential (primary) hypertension: Secondary | ICD-10-CM

## 2019-12-28 DIAGNOSIS — E1142 Type 2 diabetes mellitus with diabetic polyneuropathy: Secondary | ICD-10-CM

## 2019-12-28 NOTE — Progress Notes (Signed)
Patient ID: Jack Wallace, male   DOB: 12/27/1960, 59 y.o.   MRN: [id number]   Reason for Appointment : Followup of Type 2 Diabetes and blood pressure  Today's office visit was provided via telemedicine using video technique The patient was explained the limitations of evaluation and management by telemedicine and the availability of in person appointments.  The patient understood the limitations and agreed to proceed. Patient also understood that the telehealth visit is billable. . Location of the patient: Patient's home . Location of the provider: Physician office Only the patient and myself were participating in the encounter    History of Present Illness            Diagnosis: Type 2 diabetes mellitus, date of diagnosis: 1993         Past diabetes history: He has been treated with various drugs for his diabetes over the last several years. He has been taking metformin for at least 10 years and has been tolerating this. Over the years he has had additional medications to improve his control. He was also taking Actos previously and not clear if he had any side effects. This was stopped because of fear of long-term effects  In early 2014 he had poor control before he started walking and lost 15 pounds. Subsequently he was getting low blood sugar during the day especially before lunch and sometimes in the afternoon His glipizide was  reduced but even with 5 mg twice a day he was getting hypoglycemia.  This was reduced and Invokana added. Invokana was stopped because of lack of clear benefit in 09/2013 Because of inadequate control he was started on Victoza on his visit in 10/14 in addition to his metformin and glipizide. In 12/2017 he was started on Ozempic instead of Victoza  Recent history:   Non-insulin hypoglycemic drugs: Metformin Er 1500 mg daily,  Jardiance 25 in pm, Amaryl 2 mg before lunch, Ozempic 1.0 mg weekly  His A1c is 7.0 and unchanged   Current problems,  blood sugar patterns and management:  He is checking his blood sugars mostly after his lunch at work and a late breakfast at around 10 AM; he works night shifts  Likely not checking blood sugars after overnight fast at home but his lab fasting glucose was 106  His work activity involves a lot of walking  With this he has been able to keep his weight down and he is now about 287 pounds at home, previously 2 pounds more  No hypoglycemia with trying to take his Amaryl with his lunch at work at night  May not have been consistent with his diet with best choices over the holidays and also was not as active  No nausea with Ozempic   Meals at work: 1-3 am, at home 10-11 am or 3 PM  Monitors blood glucose:  1 times a day        Glucometer:  Contour  Blood Glucose readings from meter download   PRE-MEAL Fasting Lunch Dinner Bedtime Overall  Glucose range:  98, 123      Mean/median:      146   POST-MEAL PC Breakfast PC Lunch PC Dinner  Glucose range:  101-175  146-183   Mean/median:  147  165    PREVIOUS readings:  PRE-MEAL  evening Lunch Dinner Bedtime Overall  Glucose range:  101-142  94-169    91-171  Mean/median:  122  140      POST-MEAL PC Breakfast PC  Lunch PC Dinner  Glucose range:   113-171   Mean/median:   141      Dietician visit: Most recent: 5/19  Wt Readings from Last 3 Encounters:  03/15/19 (!) 305 lb (138.3 kg)  02/22/19 298 lb 14.4 oz (135.6 kg)  01/17/19 (!) 300 lb 1.6 oz (136.1 kg)             Lab Results  Component Value Date   HGBA1C 7.0 (H) 12/25/2019   HGBA1C 7.0 (H) 09/11/2019   HGBA1C 7.1 (H) 06/13/2019   Lab Results  Component Value Date   MICROALBUR <0.7 12/25/2019   LDLCALC 70 09/11/2019   CREATININE 1.12 12/25/2019   Lab Results  Component Value Date   FRUCTOSAMINE 294 (H) 02/24/2018   FRUCTOSAMINE 314 (H) 11/25/2017   FRUCTOSAMINE 281 12/02/2016    OTHER active problems: See review of systems   Allergies as of 12/28/2019    No Known Allergies     Medication List       Accurate as of December 28, 2019  8:36 AM. If you have any questions, ask your nurse or doctor.        Alpha Lipoic Acid 200 MG Caps Take 200 mg by mouth. 2 pills daily   aspirin 81 MG tablet Take 81 mg by mouth daily.   Bayer Microlet Lancets lancets Use to check blood sugar two times per day   CONTOUR NEXT EZ MONITOR w/Device Kit Use to check blood sugar two times per day   Contour Next Test test strip Generic drug: glucose blood TEST TWICE DAILY   diclofenac 75 MG EC tablet Commonly known as: VOLTAREN TAKE 1 TABLET BY MOUTH TWICE DAILY WITH FOOD   gabapentin 600 MG tablet Commonly known as: NEURONTIN TAKE 1 TABLET BY MOUTH THREE TIMES DAILY   glimepiride 1 MG tablet Commonly known as: AMARYL TAKE 2 TABLETS BY MOUTH AT BEDTIME What changed:   how much to take  how to take this  additional instructions   Jardiance 25 MG Tabs tablet Generic drug: empagliflozin TAKE 1 TABLET BY MOUTH EVERY DAY   losartan 100 MG tablet Commonly known as: COZAAR TAKE 1 TABLET BY MOUTH EVERY DAY   Magnesium Oxide 400 (240 Mg) MG Tabs Take 1 tablet by mouth 3 (three) times daily. Take 1 in the morning and 2 in the evening.   metFORMIN 500 MG 24 hr tablet Commonly known as: GLUCOPHAGE-XR TAKE 4 TABLETS BY MOUTH EVERY DAY WITH SUPPER   multivitamin with minerals Tabs tablet Take 1 tablet by mouth daily.   Ozempic (1 MG/DOSE) 2 MG/1.5ML Sopn Generic drug: Semaglutide (1 MG/DOSE) INJECT 1 MG INTO SKIN ONCE WEEKLY   pantoprazole 40 MG tablet Commonly known as: PROTONIX Take 1 tablet (40 mg total) by mouth 2 (two) times daily.   pravastatin 80 MG tablet Commonly known as: PRAVACHOL Take 1 tablet (80 mg total) by mouth daily.   sertraline 100 MG tablet Commonly known as: ZOLOFT Take 2 tablets (200 mg total) by mouth daily.   vitamin B-12 500 MCG tablet Commonly known as: CYANOCOBALAMIN Take 500 mcg by mouth daily.    vitamin C 1000 MG tablet Take 1,000 mg by mouth daily.   VITAMIN D PO Take 1 tablet by mouth daily. D3 1000 IU   VITAMIN E PO Take 1 capsule by mouth daily. 400 IU daily       Allergies: No Known Allergies  Past Medical History:  Diagnosis Date  . Depression   .  Diabetes mellitus without complication ([place])   . Hyperlipidemia   . Hypertension   . Ischemic colitis [facility])     Past Surgical History:  Procedure Laterality Date  . COLONOSCOPY  06/01/2014  . NASAL SEPTUM SURGERY      Family History  Problem Relation Age of Onset  . Hypertension Mother   . Stroke Father   . Diabetes Maternal Grandfather   . Colon cancer Neg Hx   . Throat cancer Neg Hx   . Prostate cancer Neg Hx   . Pancreatic cancer Neg Hx   . Heart disease Neg Hx   . Kidney disease Neg Hx   . Liver disease Neg Hx     Social History:  reports that he has never smoked. He has never used smokeless tobacco. He reports that he does not drink alcohol or use drugs.    Review of Systems      HYPERTENSION: Taking 100 mg losartan Also on [place] blood pressure readings are reportedly higher when he comes back from work, however not as 140/100 but after resting it is down to 120/80 He has not brought his monitor for comparison    BP Readings from Last 3 Encounters:  03/15/19 128/82  02/22/19 (!) 141/82  01/17/19 126/78   No renal dysfunction  Lab Results  Component Value Date   CREATININE 1.12 12/25/2019   CREATININE 1.10 09/11/2019   CREATININE 1.25 06/13/2019        Hyperlipidemia: taking pravastatin  80 mg LDL is below 100   Lab Results  Component Value Date   CHOL 148 09/11/2019   HDL 41.60 09/11/2019   LDLCALC 70 09/11/2019   LDLDIRECT 108.0 12/09/2018   TRIG 183.0 (H) 09/11/2019   CHOLHDL 4 09/11/2019     Lab Results  Component Value Date   ALT 14 12/25/2019     NEUROPATHY He has had symptoms of neuropathy in feet.   Symptoms have been very well controlled  more recently  He  takes gabapentin 600 mg tid as before    Physical Examination:  There were no vitals taken for this visit.      ASSESSMENT/PLAN:   Diabetes type 2 with obesity:  See history of present illness for detailed discussion of current diabetes management, blood sugar patterns and problems identified   A1c is 7% and unchanged  His blood sugars are generally well controlled Mostly checks his blood sugars at home within 2 to 3 hours of his meals either at lunch or his main meal at 10 AM in the morning These are fairly good usually with highest reading about 180 Lab glucose 106 With taking his Amaryl at lunchtime at work he has less tendency to hypoglycemia, does appear to be fairly active at work when he goes in in the early evening Weight has been stable He will continue the same regimen including 2 mg Amaryl before lunch and Ozempic/Jardiance/metformin unchanged  No microalbuminuria present  HYPERTENSION: He is reporting variable blood pressure readings at home However needs to bring monitor for comparison to office meter since his office readings have been generally fairly good  No change in renal function lately and potassium is stable also on losartan  NEUROPATHY: Very well controlled  Follow-up 3 months  There are no Patient Instructions on file for this visit.       Elayne Snare 12/28/2019, 8:36 AM

## 2019-12-30 ENCOUNTER — Other Ambulatory Visit: Payer: Self-pay | Admitting: Endocrinology

## 2020-01-15 ENCOUNTER — Other Ambulatory Visit: Payer: Self-pay | Admitting: Endocrinology

## 2020-03-11 ENCOUNTER — Other Ambulatory Visit: Payer: Self-pay | Admitting: Endocrinology

## 2020-03-18 ENCOUNTER — Other Ambulatory Visit: Payer: Self-pay | Admitting: Internal Medicine

## 2020-03-18 DIAGNOSIS — K2100 Gastro-esophageal reflux disease with esophagitis, without bleeding: Secondary | ICD-10-CM

## 2020-03-18 DIAGNOSIS — F321 Major depressive disorder, single episode, moderate: Secondary | ICD-10-CM

## 2020-03-18 NOTE — Telephone Encounter (Signed)
Pt need to come in for appt. Looks like last appointment was 02/2019.

## 2020-03-18 NOTE — Telephone Encounter (Signed)
Refill Request   pantoprazole (PROTONIX) 40 MG tabletpantoprazole (PROTONIX) 40 MG tablet  sertraline (ZOLOFT) 100 MG tablet  [facility] [id number] - [place], [place] - [address] AT [place]

## 2020-03-19 ENCOUNTER — Other Ambulatory Visit: Payer: Self-pay | Admitting: *Deleted

## 2020-03-19 DIAGNOSIS — F321 Major depressive disorder, single episode, moderate: Secondary | ICD-10-CM

## 2020-03-19 DIAGNOSIS — K2100 Gastro-esophageal reflux disease with esophagitis, without bleeding: Secondary | ICD-10-CM

## 2020-03-19 MED ORDER — SERTRALINE HCL 100 MG PO TABS: 200.0000 mg | ORAL_TABLET | Freq: Every day | ORAL | 1 refills | Status: DC

## 2020-03-19 MED ORDER — PANTOPRAZOLE SODIUM 40 MG PO TBEC: 40.0000 mg | DELAYED_RELEASE_TABLET | Freq: Two times a day (BID) | ORAL | 0 refills | Status: DC

## 2020-03-19 NOTE — Telephone Encounter (Signed)
Next appt scheduled 5/27 with PCP.

## 2020-03-19 NOTE — Telephone Encounter (Signed)
pantoprazole (PROTONIX) 40 MG tablet,  sertraline (ZOLOFT) 100 MG tablet, Refill request, per patient he is completely out of meds.

## 2020-03-24 IMAGING — DX DG FOOT COMPLETE 3+V*L*
3 series · 3 of 3 positions shown · non-contrast
Comparison: None.

CLINICAL DATA: Pain and swelling

EXAM:
LEFT FOOT - COMPLETE 3+ VIEW

[x foot ap left]
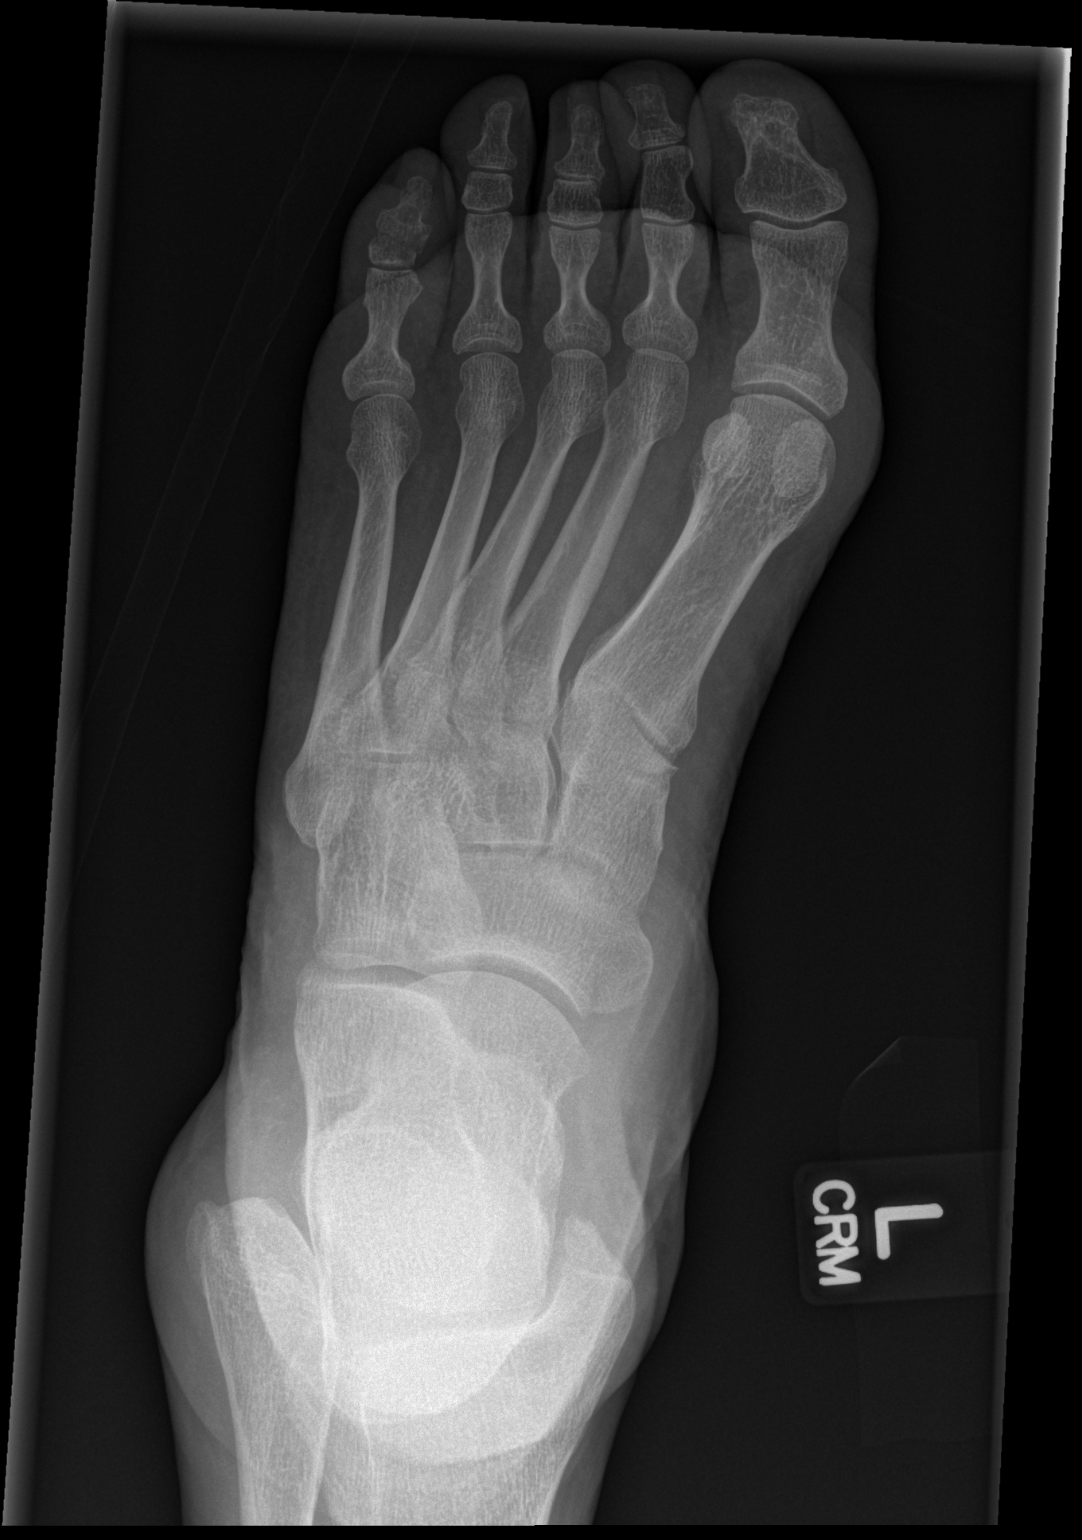

[x foot obl left]
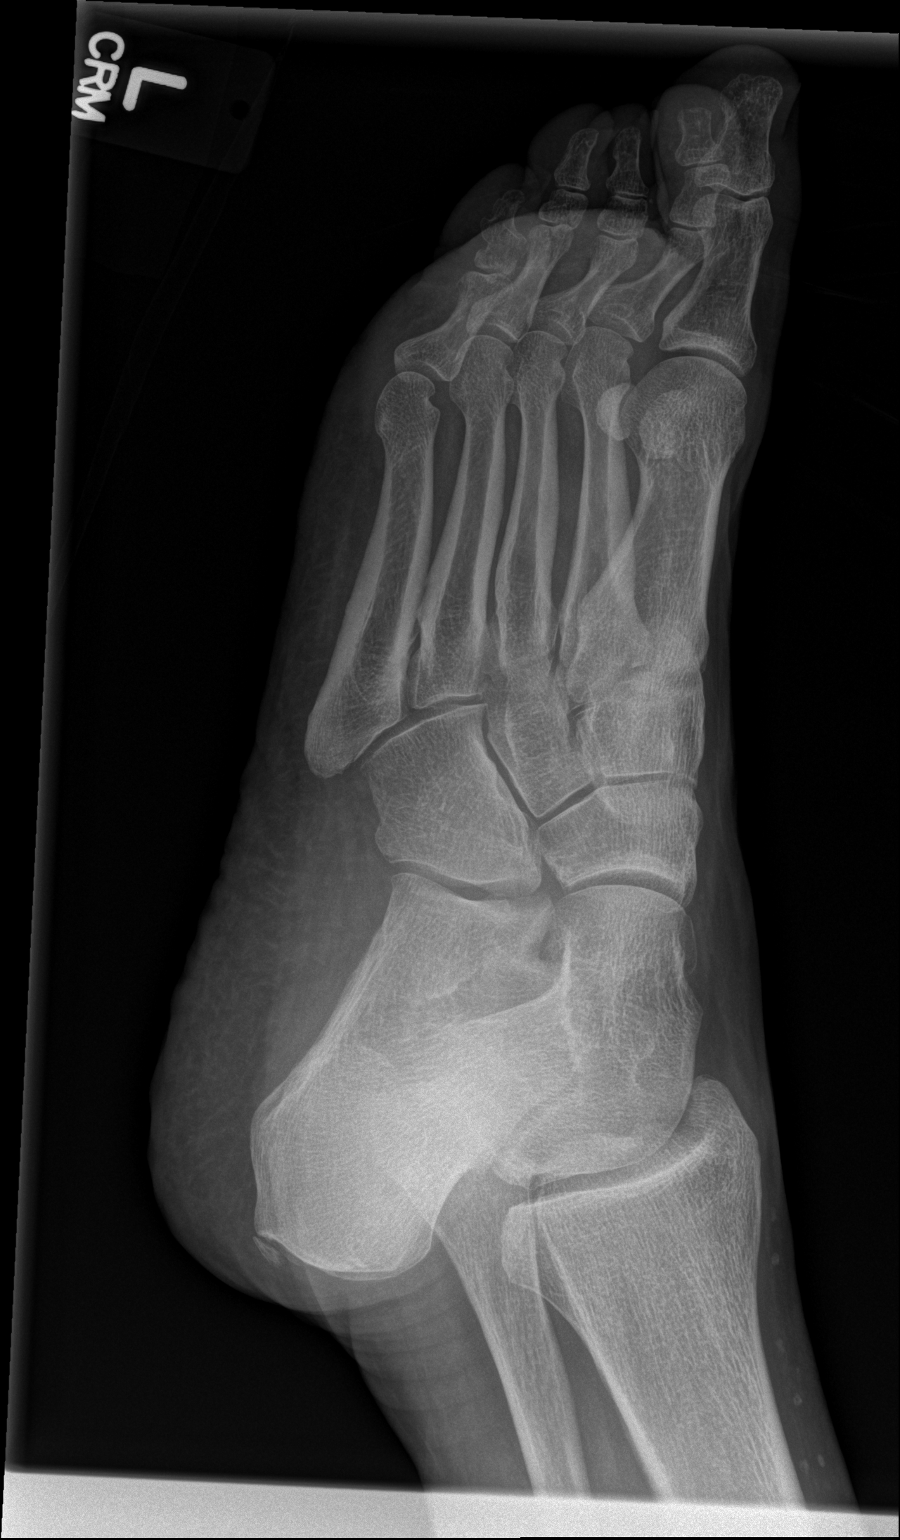

[x foot lat left]
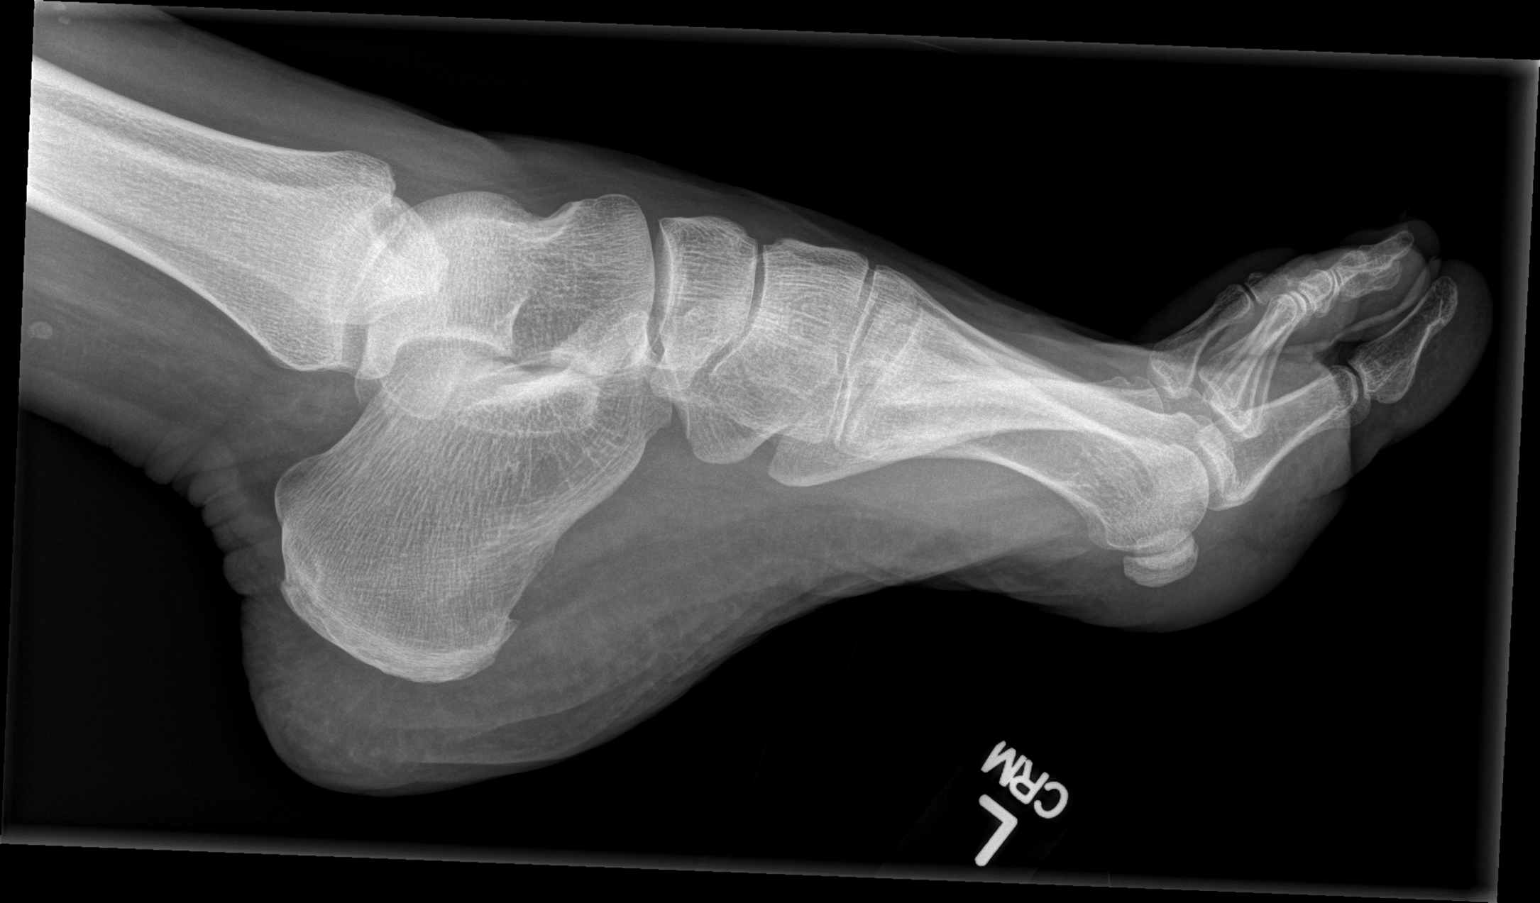

[3 of 3 positions shown; findings below may reference images not displayed]

FINDINGS: Frontal, oblique, and lateral views were obtained. No fracture or
dislocation. Joint spaces appear normal. There are small posterior
and inferior calcaneal spurs. There is Y[place].
IMPRESSION: Lkw Tiger. Small calcaneal spurs. No fracture or dislocation. No
appreciable arthropathy.

## 2020-03-26 NOTE — Telephone Encounter (Signed)
Yeah that's ok, we can refill. Thanks

## 2020-03-28 NOTE — Telephone Encounter (Signed)
Encounter opened in error

## 2020-04-01 ENCOUNTER — Other Ambulatory Visit: Payer: Self-pay | Admitting: Endocrinology

## 2020-04-15 ENCOUNTER — Other Ambulatory Visit: Payer: Self-pay | Admitting: Endocrinology

## 2020-04-29 ENCOUNTER — Other Ambulatory Visit: Payer: Self-pay | Admitting: Endocrinology

## 2020-04-29 DIAGNOSIS — I10 Essential (primary) hypertension: Secondary | ICD-10-CM

## 2020-05-01 ENCOUNTER — Other Ambulatory Visit (INDEPENDENT_AMBULATORY_CARE_PROVIDER_SITE_OTHER): Payer: BC Managed Care – PPO

## 2020-05-01 ENCOUNTER — Other Ambulatory Visit: Payer: Self-pay

## 2020-05-01 DIAGNOSIS — E1142 Type 2 diabetes mellitus with diabetic polyneuropathy: Secondary | ICD-10-CM | POA: Diagnosis not present

## 2020-05-01 LAB — BASIC METABOLIC PANEL
BUN: 17 mg/dL (ref 6–23)
CO2: 28 mEq/L (ref 19–32)
Calcium: 9.4 mg/dL (ref 8.4–10.5)
Chloride: 98 mEq/L (ref 96–112)
Creatinine, Ser: 1.06 mg/dL (ref 0.40–1.50)
GFR: 71.35 mL/min (ref >60.00)
Glucose, Bld: 82 mg/dL (ref 70–99)
Potassium: 3.8 mEq/L (ref 3.5–5.1)
Sodium: 135 mEq/L (ref 135–145)

## 2020-05-01 LAB — HEMOGLOBIN A1C: Hgb A1c MFr Bld: 7.3 % — ABNORMAL HIGH (ref 4.6–6.5)

## 2020-05-06 NOTE — Progress Notes (Signed)
Patient ID: Jack Wallace, male   DOB: 12-01-1961, 59 y.o.   MRN: [id number]   Reason for Appointment : Followup of Type 2 Diabetes and blood pressure  I connected with the above-named patient by video enabled telemedicine application and verified that I am speaking with the correct person. The patient was explained the limitations of evaluation and management by telemedicine and the availability of in person appointments.  Patient also understood that there may be a patient responsible charge related to this service . Location of the patient: Patient's home . Location of the provider: Physician office Only the patient and myself were participating in the encounter The patient understood the above statements and agreed to proceed.  History of Present Illness            Diagnosis: Type 2 diabetes mellitus, date of diagnosis: 1993         Past diabetes history: He has been treated with various drugs for his diabetes over the last several years. He has been taking metformin for at least 10 years and has been tolerating this. Over the years he has had additional medications to improve his control. He was also taking Actos previously and not clear if he had any side effects. This was stopped because of fear of long-term effects  In early 2014 he had poor control before he started walking and lost 15 pounds. Subsequently he was getting low blood sugar during the day especially before lunch and sometimes in the afternoon His glipizide was  reduced but even with 5 mg twice a day he was getting hypoglycemia.  This was reduced and Invokana added. Invokana was stopped because of lack of clear benefit in 09/2013 Because of inadequate control he was started on Victoza on his visit in 10/14 in addition to his metformin and glipizide. In 12/2017 he was started on Ozempic instead of Victoza  Recent history:   Non-insulin hypoglycemic drugs: Metformin Er 1500 mg daily,  Jardiance 25 in pm,  Amaryl 2 mg before lunch, Ozempic 1.0 mg weekly  His A1c is 7.3 compared to  7.0 and previously stable   Current problems, blood sugar patterns and management:  He thinks his blood sugars are higher in the last 2 to 3 weeks because of not working as much and with staying home he is not walking as much and eating more  Currently not going to the gym for exercise  He is asking about feeling hypoglycemic 2 to 3 hours after his lunch at work  Lowest documented blood sugar 65  This is despite taking his Amaryl in the evening before he leaves to go to work and not eating until about midnight also  Again his main meal is usually around 10 AM  Blood sugars after meals are generally fairly good except for a few readings in the 160-200 range occasionally  Fasting lab glucose 82  He thinks that he is regular with all his other medications  Appears to be gaining weight   Meals at work: 1-3 am, at home 10-11 am or 3 PM  Monitors blood glucose:  1 times a day        Glucometer:  Contour  Blood Glucose readings from meter download   PRE-MEAL Fasting Lunch  midday  6 AM Overall  Glucose range:   82-128  96-164  65, 74   Mean/median:   121  129   120   POST-MEAL PC Breakfast PC Lunch PC Dinner  Glucose range:  121-203   Mean/median:   141    Previous readings:  PRE-MEAL Fasting Lunch Dinner Bedtime Overall  Glucose range:  98, 123      Mean/median:      146   POST-MEAL PC Breakfast PC Lunch PC Dinner  Glucose range:  101-175  146-183   Mean/median:  147  165     Dietician visit: Most recent: 5/19  Wt Readings from Last 3 Encounters:  03/15/19 (!) 305 lb (138.3 kg)  02/22/19 298 lb 14.4 oz (135.6 kg)  01/17/19 (!) 300 lb 1.6 oz (136.1 kg)             Lab Results  Component Value Date   HGBA1C 7.3 (H) 05/01/2020   HGBA1C 7.0 (H) 12/25/2019   HGBA1C 7.0 (H) 09/11/2019   Lab Results  Component Value Date   MICROALBUR <0.7 12/25/2019   LDLCALC 70 09/11/2019    CREATININE 1.06 05/01/2020   Lab Results  Component Value Date   FRUCTOSAMINE 294 (H) 02/24/2018   FRUCTOSAMINE 314 (H) 11/25/2017   FRUCTOSAMINE 281 12/02/2016    OTHER active problems: See review of systems   Allergies as of 05/07/2020   No Known Allergies     Medication List       Accurate as of May 06, 2020  8:57 PM. If you have any questions, ask your nurse or doctor.        Alpha Lipoic Acid 200 MG Caps Take 200 mg by mouth. 2 pills daily   aspirin 81 MG tablet Take 81 mg by mouth daily.   Bayer Microlet Lancets lancets Use to check blood sugar two times per day   CONTOUR NEXT EZ MONITOR w/Device Kit Use to check blood sugar two times per day   Contour Next Test test strip Generic drug: glucose blood TEST TWICE DAILY   diclofenac 75 MG EC tablet Commonly known as: VOLTAREN TAKE 1 TABLET BY MOUTH TWICE DAILY WITH FOOD   gabapentin 600 MG tablet Commonly known as: NEURONTIN TAKE 1 TABLET BY MOUTH THREE TIMES DAILY   glimepiride 1 MG tablet Commonly known as: AMARYL TAKE 2 TABLETS BY MOUTH AT BEDTIME   Jardiance 25 MG Tabs tablet Generic drug: empagliflozin TAKE 1 TABLET BY MOUTH EVERY DAY   losartan 100 MG tablet Commonly known as: COZAAR TAKE 1 TABLET BY MOUTH EVERY DAY   Magnesium Oxide 400 (240 Mg) MG Tabs Take 1 tablet by mouth 3 (three) times daily. Take 1 in the morning and 2 in the evening.   metFORMIN 500 MG 24 hr tablet Commonly known as: GLUCOPHAGE-XR TAKE 4 TABLETS BY MOUTH EVERY DAY WITH SUPPER   multivitamin with minerals Tabs tablet Take 1 tablet by mouth daily.   Ozempic (1 MG/DOSE) 2 MG/1.5ML Sopn Generic drug: Semaglutide (1 MG/DOSE) INJECT 1 MG INTO SKIN ONCE WEEKLY   pantoprazole 40 MG tablet Commonly known as: PROTONIX Take 1 tablet (40 mg total) by mouth 2 (two) times daily.   pravastatin 80 MG tablet Commonly known as: PRAVACHOL TAKE 1 TABLET(80 MG) BY MOUTH DAILY PATIENT WILL USE OF 40 MG SUPPLY FIRST     sertraline 100 MG tablet Commonly known as: ZOLOFT Take 2 tablets (200 mg total) by mouth daily.   vitamin B-12 500 MCG tablet Commonly known as: CYANOCOBALAMIN Take 500 mcg by mouth daily.   vitamin C 1000 MG tablet Take 1,000 mg by mouth daily.   VITAMIN D PO Take 1 tablet by mouth daily. D3 1000 IU  VITAMIN E PO Take 1 capsule by mouth daily. 400 IU daily       Allergies: No Known Allergies  Past Medical History:  Diagnosis Date  . Depression   . Diabetes mellitus without complication ([place])   . Hyperlipidemia   . Hypertension   . Ischemic colitis [facility])     Past Surgical History:  Procedure Laterality Date  . COLONOSCOPY  06/01/2014  . NASAL SEPTUM SURGERY      Family History  Problem Relation Age of Onset  . Hypertension Mother   . Stroke Father   . Diabetes Maternal Grandfather   . Colon cancer Neg Hx   . Throat cancer Neg Hx   . Prostate cancer Neg Hx   . Pancreatic cancer Neg Hx   . Heart disease Neg Hx   . Kidney disease Neg Hx   . Liver disease Neg Hx     Social History:  reports that he has never smoked. He has never used smokeless tobacco. He reports that he does not drink alcohol or use drugs.    Review of Systems      HYPERTENSION: Taking 100 mg losartan Also on Jardiance  He is now getting his blood pressure checked at work and was 128/78 the last 2 times Has not seen his PCP in the office for over a year   BP Readings from Last 3 Encounters:  03/15/19 128/82  02/22/19 (!) 141/82  01/17/19 126/78   No renal dysfunction  Lab Results  Component Value Date   CREATININE 1.06 05/01/2020   CREATININE 1.12 12/25/2019   CREATININE 1.10 09/11/2019        Hyperlipidemia: taking pravastatin  80 mg LDL is below 100   Lab Results  Component Value Date   CHOL 148 09/11/2019   HDL 41.60 09/11/2019   LDLCALC 70 09/11/2019   LDLDIRECT 108.0 12/09/2018   TRIG 183.0 (H) 09/11/2019   CHOLHDL 4 09/11/2019     Lab Results   Component Value Date   ALT 14 12/25/2019     NEUROPATHY He has had symptoms of neuropathy in feet.   Symptoms have been very well controlled    He  takes gabapentin 600 mg tid as before  He is scheduled for eye exam in 5/21   Physical Examination:  There were no vitals taken for this visit.      ASSESSMENT/PLAN:   Diabetes type 2 with obesity:  See history of present illness for detailed discussion of current diabetes management, blood sugar patterns and problems identified   A1c is 7.3 compared to 7% He is on a regimen of 2 mg Amaryl in the evening and Ozempic/Jardiance/metformin as before  Not clear why his A1c is higher although he thinks this is from less activity and not watching his diet especially when he is not working His blood sugars recently have been fairly good except for couple of readings on his monitor as of 5/11 Also he is tending to have some symptoms of hypoglycemia reportedly at work  His treatment will be changed only with the glimepiride to be taken before his meal at work and only 1 mg.  He can continue 2 mg on his days off unless he is starting to get low especially if he starts exercising at the gym   HYPERTENSION: He is getting blood pressure monitored at work and this is  Renal function and potassium stable with losartan We will continue the same  NEUROPATHY: Symptoms are well controlled He will  try to get a foot exam with his PCP at upcoming visit  Eye exam scheduled for later this month with Dr. Gershon Crane  Follow-up 3 months  There are no Patient Instructions on file for this visit.       Elayne Snare 05/06/2020, 8:57 PM

## 2020-05-07 ENCOUNTER — Encounter: Payer: Self-pay | Admitting: Endocrinology

## 2020-05-07 ENCOUNTER — Telehealth (INDEPENDENT_AMBULATORY_CARE_PROVIDER_SITE_OTHER): Payer: BC Managed Care – PPO | Admitting: Endocrinology

## 2020-05-07 DIAGNOSIS — E1142 Type 2 diabetes mellitus with diabetic polyneuropathy: Secondary | ICD-10-CM

## 2020-05-07 DIAGNOSIS — I10 Essential (primary) hypertension: Secondary | ICD-10-CM

## 2020-05-07 DIAGNOSIS — E7800 Pure hypercholesterolemia, unspecified: Secondary | ICD-10-CM | POA: Diagnosis not present

## 2020-05-14 DIAGNOSIS — H2513 Age-related nuclear cataract, bilateral: Secondary | ICD-10-CM | POA: Diagnosis not present

## 2020-05-14 DIAGNOSIS — E119 Type 2 diabetes mellitus without complications: Secondary | ICD-10-CM | POA: Diagnosis not present

## 2020-05-14 DIAGNOSIS — H5213 Myopia, bilateral: Secondary | ICD-10-CM | POA: Diagnosis not present

## 2020-05-14 DIAGNOSIS — H524 Presbyopia: Secondary | ICD-10-CM | POA: Diagnosis not present

## 2020-05-14 DIAGNOSIS — H52203 Unspecified astigmatism, bilateral: Secondary | ICD-10-CM | POA: Diagnosis not present

## 2020-05-14 LAB — HM DIABETES EYE EXAM

## 2020-05-16 ENCOUNTER — Encounter: Payer: BC Managed Care – PPO | Admitting: Internal Medicine

## 2020-07-01 ENCOUNTER — Other Ambulatory Visit: Payer: Self-pay | Admitting: Endocrinology

## 2020-07-01 ENCOUNTER — Other Ambulatory Visit: Payer: Self-pay

## 2020-07-01 DIAGNOSIS — K2100 Gastro-esophageal reflux disease with esophagitis, without bleeding: Secondary | ICD-10-CM

## 2020-07-02 MED ORDER — PANTOPRAZOLE SODIUM 40 MG PO TBEC: 40.0000 mg | DELAYED_RELEASE_TABLET | Freq: Two times a day (BID) | ORAL | 0 refills | Status: DC

## 2020-07-09 ENCOUNTER — Other Ambulatory Visit: Payer: Self-pay | Admitting: Endocrinology

## 2020-07-12 ENCOUNTER — Encounter: Payer: Self-pay | Admitting: Internal Medicine

## 2020-07-12 ENCOUNTER — Ambulatory Visit: Payer: BC Managed Care – PPO | Admitting: Internal Medicine

## 2020-07-12 ENCOUNTER — Other Ambulatory Visit: Payer: Self-pay

## 2020-07-12 DIAGNOSIS — K2100 Gastro-esophageal reflux disease with esophagitis, without bleeding: Secondary | ICD-10-CM | POA: Diagnosis not present

## 2020-07-12 DIAGNOSIS — E1142 Type 2 diabetes mellitus with diabetic polyneuropathy: Secondary | ICD-10-CM | POA: Diagnosis not present

## 2020-07-12 DIAGNOSIS — F321 Major depressive disorder, single episode, moderate: Secondary | ICD-10-CM

## 2020-07-12 DIAGNOSIS — Z7984 Long term (current) use of oral hypoglycemic drugs: Secondary | ICD-10-CM | POA: Diagnosis not present

## 2020-07-12 MED ORDER — PANTOPRAZOLE SODIUM 40 MG PO TBEC: 40.0000 mg | DELAYED_RELEASE_TABLET | Freq: Two times a day (BID) | ORAL | 4 refills | Status: DC

## 2020-07-12 MED ORDER — SERTRALINE HCL 100 MG PO TABS: 200.0000 mg | ORAL_TABLET | Freq: Every day | ORAL | 6 refills | Status: DC

## 2020-07-12 NOTE — Patient Instructions (Signed)
Mr. Wik, It was nice meeting you!  I'm glad things are going well. I have sent in refills for your Zoloft and Protonix for the next year.   Please call if you have any questions or concerns, but otherwise we'll plan on seeing you next year!  Take care, Dr. Koleen Distance

## 2020-07-14 ENCOUNTER — Encounter: Payer: Self-pay | Admitting: Internal Medicine

## 2020-07-14 NOTE — Assessment & Plan Note (Signed)
Follows with Dr. Dwyane Dee. Reports having occasional symptomatic hypoglycemia. Hopeful that cutting glimepiride dose in half will help. Follow-up next month with endocrinologist.

## 2020-07-14 NOTE — Assessment & Plan Note (Signed)
Doing well on Zoloft. No medication side effects. Denies changes in mood or sleep. Will refill medication today.

## 2020-07-14 NOTE — Assessment & Plan Note (Signed)
Symptoms well controlled on Protonix. Will refill this today.

## 2020-07-14 NOTE — Progress Notes (Signed)
Established Patient Office Visit  Subjective:  Patient ID: Jack Wallace, male    DOB: [birth year]  Age: 59 y.o. MRN: [id number]  CC:  Chief Complaint  Patient presents with  . Medication Refill    zoloft and protonix    HPI Jack Wallace presents for follow-up on GERD and MDD. Please see problem based charting for further details.   Past Medical History:  Diagnosis Date  . Depression   . Diabetes mellitus without complication ([place])   . Hyperlipidemia   . Hypertension   . Ischemic colitis [facility])     Past Surgical History:  Procedure Laterality Date  . COLONOSCOPY  06/01/2014  . NASAL SEPTUM SURGERY      Family History  Problem Relation Age of Onset  . Hypertension Mother   . Stroke Father   . Diabetes Maternal Grandfather   . Colon cancer Neg Hx   . Throat cancer Neg Hx   . Prostate cancer Neg Hx   . Pancreatic cancer Neg Hx   . Heart disease Neg Hx   . Kidney disease Neg Hx   . Liver disease Neg Hx     Social History   Socioeconomic History  . Marital status: Married    Spouse name: Not on file  . Number of children: 0  . Years of education: Not on file  . Highest education level: Not on file  Occupational History  . Occupation: Agricultural consultant  Tobacco Use  . Smoking status: Never Smoker  . Smokeless tobacco: Never Used  Substance and Sexual Activity  . Alcohol use: No  . Drug use: No  . Sexual activity: Not on file  Other Topics Concern  . Not on file  Social History Narrative  . Not on file   Social Determinants of Health   Financial Resource Strain:   . Difficulty of Paying Living Expenses:   Food Insecurity:   . Worried About Charity fundraiser in the Last Year:   . Arboriculturist in the Last Year:   Transportation Needs:   . Film/video editor (Medical):   Marland Kitchen Lack of Transportation (Non-Medical):   Physical Activity:   . Days of Exercise per Week:   . Minutes of Exercise per Session:   Stress:   . Feeling of Stress :   Social  Connections:   . Frequency of Communication with Friends and Family:   . Frequency of Social Gatherings with Friends and Family:   . Attends Religious Services:   . Active Member of Clubs or Organizations:   . Attends Archivist Meetings:   Marland Kitchen Marital Status:   Intimate Partner Violence:   . Fear of Current or Ex-Partner:   . Emotionally Abused:   Marland Kitchen Physically Abused:   . Sexually Abused:     Outpatient Medications Prior to Visit  Medication Sig Dispense Refill  . Alpha Lipoic Acid 200 MG CAPS Take 200 mg by mouth. 2 pills daily    . Ascorbic Acid (VITAMIN C) 1000 MG tablet Take 1,000 mg by mouth daily.    Marland Kitchen aspirin 81 MG tablet Take 81 mg by mouth daily.    Marland Kitchen BAYER MICROLET LANCETS lancets Use to check blood sugar two times per day 100 each 2  . Blood Glucose Monitoring Suppl (CONTOUR NEXT EZ MONITOR) w/Device KIT Use to check blood sugar two times per day 1 kit 2  . Cholecalciferol (VITAMIN D PO) Take 1 tablet by mouth daily. D3 1000  IU    . CONTOUR NEXT TEST test strip TEST TWICE DAILY 100 strip 2  . diclofenac (VOLTAREN) 75 MG EC tablet TAKE 1 TABLET BY MOUTH TWICE DAILY WITH FOOD 60 tablet 0  . gabapentin (NEURONTIN) 600 MG tablet TAKE 1 TABLET BY MOUTH THREE TIMES DAILY 90 tablet 0  . glimepiride (AMARYL) 1 MG tablet TAKE 2 TABLETS BY MOUTH AT BEDTIME 180 tablet 3  . JARDIANCE 25 MG TABS tablet TAKE 1 TABLET BY MOUTH EVERY DAY 30 tablet 1  . losartan (COZAAR) 100 MG tablet TAKE 1 TABLET BY MOUTH EVERY DAY 90 tablet 1  . Magnesium Oxide 400 (240 MG) MG TABS Take 1 tablet by mouth 3 (three) times daily. Take 1 in the morning and 2 in the evening. 90 tablet 2  . metFORMIN (GLUCOPHAGE-XR) 500 MG 24 hr tablet TAKE 4 TABLETS BY MOUTH EVERY DAY WITH SUPPER 120 tablet 0  . Multiple Vitamin (MULTIVITAMIN WITH MINERALS) TABS tablet Take 1 tablet by mouth daily.    Marland Kitchen OZEMPIC, 1 MG/DOSE, 4 MG/3ML SOPN INJECT 1 MG INTO THE SKIN ONCE A WEEK 3 mL 1  . pravastatin (PRAVACHOL) 80 MG  tablet TAKE 1 TABLET(80 MG) BY MOUTH DAILY PATIENT WILL USE OF 40 MG SUPPLY FIRST 90 tablet 3  . vitamin B-12 (CYANOCOBALAMIN) 500 MCG tablet Take 500 mcg by mouth daily.    Marland Kitchen VITAMIN E PO Take 1 capsule by mouth daily. 400 IU daily    . pantoprazole (PROTONIX) 40 MG tablet Take 1 tablet (40 mg total) by mouth 2 (two) times daily. 180 tablet 0  . sertraline (ZOLOFT) 100 MG tablet Take 2 tablets (200 mg total) by mouth daily. 120 tablet 1   No facility-administered medications prior to visit.    No Known Allergies  ROS Review of Systems  Constitutional: Negative for activity change, chills, fever and unexpected weight change.  HENT: Negative for trouble swallowing.   Eyes: Negative for visual disturbance.  Respiratory: Negative for cough and shortness of breath.   Cardiovascular: Negative for chest pain and palpitations.  Gastrointestinal: Negative for abdominal pain and nausea.  Endocrine: Negative for polydipsia, polyphagia and polyuria.  Genitourinary: Negative for difficulty urinating.  Musculoskeletal: Negative for back pain and joint swelling.  Neurological: Negative for syncope, weakness, light-headedness and headaches.  Psychiatric/Behavioral: Negative for sleep disturbance.      Objective:    Physical Exam Constitutional:      Appearance: Normal appearance.  Eyes:     Conjunctiva/sclera: Conjunctivae normal.  Cardiovascular:     Rate and Rhythm: Normal rate and regular rhythm.  Pulmonary:     Effort: Pulmonary effort is normal.     Breath sounds: Normal breath sounds.  Abdominal:     General: There is no distension.     Palpations: Abdomen is soft.     Tenderness: There is no abdominal tenderness.  Musculoskeletal:     Cervical back: Neck supple.     Right lower leg: No edema.     Left lower leg: No edema.  Neurological:     General: No focal deficit present.     Mental Status: He is alert and oriented to person, place, and time.  Psychiatric:        Mood  and Affect: Mood normal.        Behavior: Behavior normal.     BP (!) 127/87 (BP Location: Left Arm, Patient Position: Sitting, Cuff Size: Large)   Pulse 90   Wt (!) 298 lb  3.2 oz (135.3 kg)   SpO2 95%   BMI 40.44 kg/m  Wt Readings from Last 3 Encounters:  07/12/20 (!) 298 lb 3.2 oz (135.3 kg)  03/15/19 (!) 305 lb (138.3 kg)  02/22/19 298 lb 14.4 oz (135.6 kg)     Health Maintenance Due  Topic Date Due  . COVID-19 Vaccine (1) Never done  . FOOT EXAM  12/13/2019    There are no preventive care reminders to display for this patient.  No results found for: TSH Lab Results  Component Value Date   WBC 8.4 09/22/2018   HGB 15.0 09/22/2018   HCT 48.9 09/22/2018   MCV 88.6 09/22/2018   PLT 270 09/22/2018   Lab Results  Component Value Date   NA 135 05/01/2020   K 3.8 05/01/2020   CO2 28 05/01/2020   GLUCOSE 82 05/01/2020   BUN 17 05/01/2020   CREATININE 1.06 05/01/2020   BILITOT 0.5 12/25/2019   ALKPHOS 59 12/25/2019   AST 18 12/25/2019   ALT 14 12/25/2019   PROT 7.7 12/25/2019   ALBUMIN 4.7 12/25/2019   CALCIUM 9.4 05/01/2020   ANIONGAP 9 09/22/2018   GFR 71.35 05/01/2020   Lab Results  Component Value Date   CHOL 148 09/11/2019   Lab Results  Component Value Date   HDL 41.60 09/11/2019   Lab Results  Component Value Date   LDLCALC 70 09/11/2019   Lab Results  Component Value Date   TRIG 183.0 (H) 09/11/2019   Lab Results  Component Value Date   CHOLHDL 4 09/11/2019   Lab Results  Component Value Date   HGBA1C 7.3 (H) 05/01/2020      Assessment & Plan:   Problem List Items Addressed This Visit      Digestive   GERD (gastroesophageal reflux disease)    Symptoms well controlled on Protonix. Will refill this today.       Relevant Medications   pantoprazole (PROTONIX) 40 MG tablet     Endocrine   Type 2 diabetes mellitus (HCC) (Chronic)    Follows with Dr. Dwyane Dee. Reports having occasional symptomatic hypoglycemia. Hopeful that cutting  glimepiride dose in half will help. Follow-up next month with endocrinologist.         Other   MDD (major depressive disorder)    Doing well on Zoloft. No medication side effects. Denies changes in mood or sleep. Will refill medication today.       Relevant Medications   sertraline (ZOLOFT) 100 MG tablet      Meds ordered this encounter  Medications  . sertraline (ZOLOFT) 100 MG tablet    Sig: Take 2 tablets (200 mg total) by mouth daily.    Dispense:  120 tablet    Refill:  6  . pantoprazole (PROTONIX) 40 MG tablet    Sig: Take 1 tablet (40 mg total) by mouth 2 (two) times daily.    Dispense:  180 tablet    Refill:  4    Follow-up: Return in about 1 year (around 07/12/2021) for PCP follow-up .    Delice Bison, DO

## 2020-07-15 NOTE — Progress Notes (Signed)
[facility] Medicine Clinic Attending  Case discussed with Dr. Bloomfield  At the time of the visit.  We reviewed the resident's history and exam and pertinent patient test results.  I agree with the assessment, diagnosis, and plan of care documented in the resident's note.  

## 2020-07-29 ENCOUNTER — Other Ambulatory Visit: Payer: Self-pay | Admitting: Endocrinology

## 2020-08-09 ENCOUNTER — Other Ambulatory Visit: Payer: Self-pay

## 2020-08-09 ENCOUNTER — Other Ambulatory Visit (INDEPENDENT_AMBULATORY_CARE_PROVIDER_SITE_OTHER): Payer: BC Managed Care – PPO

## 2020-08-09 DIAGNOSIS — E1142 Type 2 diabetes mellitus with diabetic polyneuropathy: Secondary | ICD-10-CM

## 2020-08-09 DIAGNOSIS — E7800 Pure hypercholesterolemia, unspecified: Secondary | ICD-10-CM

## 2020-08-09 LAB — COMPREHENSIVE METABOLIC PANEL
ALT: 14 U/L (ref 0–53)
AST: 18 U/L (ref 0–37)
Albumin: 4.7 g/dL (ref 3.5–5.2)
Alkaline Phosphatase: 53 U/L (ref 39–117)
BUN: 18 mg/dL (ref 6–23)
CO2: 26 mEq/L (ref 19–32)
Calcium: 9.9 mg/dL (ref 8.4–10.5)
Chloride: 103 mEq/L (ref 96–112)
Creatinine, Ser: 1.25 mg/dL (ref 0.40–1.50)
GFR: 58.93 mL/min — ABNORMAL LOW (ref >60.00)
Glucose, Bld: 83 mg/dL (ref 70–99)
Potassium: 4.2 mEq/L (ref 3.5–5.1)
Sodium: 139 mEq/L (ref 135–145)
Total Bilirubin: 0.7 mg/dL (ref 0.2–1.2)
Total Protein: 7.7 g/dL (ref 6.0–8.3)

## 2020-08-09 LAB — LIPID PANEL
Cholesterol: 148 mg/dL (ref 0–200)
HDL: 41.2 mg/dL (ref >39.00)
LDL Cholesterol: 73 mg/dL (ref 0–99)
NonHDL: 106.75
Total CHOL/HDL Ratio: 4
Triglycerides: 168 mg/dL — ABNORMAL HIGH (ref 0.0–149.0)
VLDL: 33.6 mg/dL (ref 0.0–40.0)

## 2020-08-09 LAB — HEMOGLOBIN A1C: Hgb A1c MFr Bld: 7 % — ABNORMAL HIGH (ref 4.6–6.5)

## 2020-08-12 ENCOUNTER — Telehealth (INDEPENDENT_AMBULATORY_CARE_PROVIDER_SITE_OTHER): Payer: BC Managed Care – PPO | Admitting: Endocrinology

## 2020-08-12 ENCOUNTER — Other Ambulatory Visit: Payer: Self-pay

## 2020-08-12 ENCOUNTER — Encounter: Payer: Self-pay | Admitting: Endocrinology

## 2020-08-12 VITALS — Wt 295.0 lb

## 2020-08-12 DIAGNOSIS — I10 Essential (primary) hypertension: Secondary | ICD-10-CM | POA: Diagnosis not present

## 2020-08-12 DIAGNOSIS — E1142 Type 2 diabetes mellitus with diabetic polyneuropathy: Secondary | ICD-10-CM

## 2020-08-12 DIAGNOSIS — E7800 Pure hypercholesterolemia, unspecified: Secondary | ICD-10-CM | POA: Diagnosis not present

## 2020-08-12 NOTE — Progress Notes (Signed)
Patient ID: Jack Wallace, male   DOB: 06/18/1961, 59 y.o.   MRN: [id number]   Reason for Appointment : Followup of  I connected with the above-named patient by video enabled telemedicine application and verified that I am speaking with the correct person. The patient was explained the limitations of evaluation and management by telemedicine and the availability of in person appointments.  Patient also understood that there may be a patient responsible charge related to this service . Location of the patient: Patient's home . Location of the provider: Physician office Only the patient and myself were participating in the encounter The patient understood the above statements and agreed to proceed.  History of Present Illness            Diagnosis: Type 2 diabetes mellitus, date of diagnosis: 1993         Past diabetes history: He has been treated with various drugs for his diabetes over the last several years. He has been taking metformin for at least 10 years and has been tolerating this. Over the years he has had additional medications to improve his control. He was also taking Actos previously and not clear if he had any side effects. This was stopped because of fear of long-term effects  In early 2014 he had poor control before he started walking and lost 15 pounds. Subsequently he was getting low blood sugar during the day especially before lunch and sometimes in the afternoon His glipizide was  reduced but even with 5 mg twice a day he was getting hypoglycemia.  This was reduced and Invokana added. Invokana was stopped because of lack of clear benefit in 09/2013 Because of inadequate control he was started on Victoza on his visit in 10/14 in addition to his metformin and glipizide. In 12/2017 he was started on Ozempic instead of Victoza  Recent history:   Non-insulin hypoglycemic drugs: Metformin Er 1500 mg daily,  Jardiance 25 in pm, Amaryl 1 mg before lunch, Ozempic 1.0 mg  weekly  His A1c is   7.0 and stable   Current problems, blood sugar patterns and management:  He thinks his weight is down to 295, has been as high as 305 earlier this year  However he has not been able to exercise because of knee pain  He says he has difficulty getting on the schedule for pool exercises at the [facility] in the morning when he is coming off from work  Appears to be doing well with his overall control  He is having fairly good blood sugars except occasional high readings at night after supper  He is taking his glimepiride before his lunch at work but on his off days he will take it at 10:00 instead of before dinner  He has been regular with Ozempic  No significant change in renal function with Jardiance   Meals at work: 1-3 am, at home 10-11 am or 3 PM  Monitors blood glucose:  1 times a day        Glucometer:  Contour  Blood Glucose readings from meter download   PRE-MEAL Fasting Lunch Dinner Bedtime Overall  Glucose range:  76-108  114-146     Mean/median:   135   129   POST-MEAL PC Breakfast PC Lunch PC Dinner  Glucose range:   103-188  155, 202  Mean/median:   137  179   Previous readings  PRE-MEAL Fasting Lunch  midday  6 AM Overall  Glucose range:  82-128  96-164  65, 74   Mean/median:   121  129   120   POST-MEAL PC Breakfast PC Lunch PC Dinner  Glucose range:   121-203   Mean/median:   141     Dietician visit: Most recent: 5/19  Wt Readings from Last 3 Encounters:  08/12/20 295 lb (133.8 kg)  07/12/20 (!) 298 lb 3.2 oz (135.3 kg)  03/15/19 (!) 305 lb (138.3 kg)             Lab Results  Component Value Date   HGBA1C 7.0 (H) 08/09/2020   HGBA1C 7.3 (H) 05/01/2020   HGBA1C 7.0 (H) 12/25/2019   Lab Results  Component Value Date   MICROALBUR <0.7 12/25/2019   LDLCALC 73 08/09/2020   CREATININE 1.25 08/09/2020   Lab Results  Component Value Date   FRUCTOSAMINE 294 (H) 02/24/2018   FRUCTOSAMINE 314 (H) 11/25/2017   FRUCTOSAMINE  281 12/02/2016    OTHER active problems: See review of systems   Allergies as of 08/12/2020   No Known Allergies     Medication List       Accurate as of August 12, 2020  9:36 AM. If you have any questions, ask your nurse or doctor.        Alpha Lipoic Acid 200 MG Caps Take 200 mg by mouth. 2 pills daily   aspirin 81 MG tablet Take 81 mg by mouth daily.   Bayer Microlet Lancets lancets Use to check blood sugar two times per day   CONTOUR NEXT EZ MONITOR w/Device Kit Use to check blood sugar two times per day   Contour Next Test test strip Generic drug: glucose blood TEST TWICE DAILY   diclofenac 75 MG EC tablet Commonly known as: VOLTAREN TAKE 1 TABLET BY MOUTH TWICE DAILY WITH FOOD   gabapentin 600 MG tablet Commonly known as: NEURONTIN TAKE 1 TABLET BY MOUTH THREE TIMES DAILY   glimepiride 1 MG tablet Commonly known as: AMARYL TAKE 2 TABLETS BY MOUTH AT BEDTIME   Jardiance 25 MG Tabs tablet Generic drug: empagliflozin TAKE 1 TABLET BY MOUTH EVERY DAY   losartan 100 MG tablet Commonly known as: COZAAR TAKE 1 TABLET BY MOUTH EVERY DAY   Magnesium Oxide 400 (240 Mg) MG Tabs Take 1 tablet by mouth 3 (three) times daily. Take 1 in the morning and 2 in the evening.   metFORMIN 500 MG 24 hr tablet Commonly known as: GLUCOPHAGE-XR TAKE 4 TABLETS BY MOUTH EVERY DAY WITH SUPPER   multivitamin with minerals Tabs tablet Take 1 tablet by mouth daily.   Ozempic (1 MG/DOSE) 4 MG/3ML Sopn Generic drug: Semaglutide (1 MG/DOSE) INJECT 1 MG INTO THE SKIN ONCE A WEEK   pantoprazole 40 MG tablet Commonly known as: PROTONIX Take 1 tablet (40 mg total) by mouth 2 (two) times daily.   pravastatin 80 MG tablet Commonly known as: PRAVACHOL TAKE 1 TABLET(80 MG) BY MOUTH DAILY PATIENT WILL USE OF 40 MG SUPPLY FIRST   sertraline 100 MG tablet Commonly known as: ZOLOFT Take 2 tablets (200 mg total) by mouth daily.   vitamin B-12 500 MCG tablet Commonly known as:  CYANOCOBALAMIN Take 500 mcg by mouth daily.   vitamin C 1000 MG tablet Take 1,000 mg by mouth daily.   VITAMIN D PO Take 1 tablet by mouth daily. D3 1000 IU   VITAMIN E PO Take 1 capsule by mouth daily. 400 IU daily       Allergies: No Known Allergies  Past Medical History:  Diagnosis Date  . Depression   . Diabetes mellitus without complication ([place])   . Hyperlipidemia   . Hypertension   . Ischemic colitis [facility])     Past Surgical History:  Procedure Laterality Date  . COLONOSCOPY  06/01/2014  . NASAL SEPTUM SURGERY      Family History  Problem Relation Age of Onset  . Hypertension Mother   . Stroke Father   . Diabetes Maternal Grandfather   . Colon cancer Neg Hx   . Throat cancer Neg Hx   . Prostate cancer Neg Hx   . Pancreatic cancer Neg Hx   . Heart disease Neg Hx   . Kidney disease Neg Hx   . Liver disease Neg Hx     Social History:  reports that he has never smoked. He has never used smokeless tobacco. He reports that he does not drink alcohol and does not use drugs.    Review of Systems      HYPERTENSION: Taking 100 mg losartan Also on Jardiance  He is getting his blood pressure checked at work  Blood pressure consistently in the 120s and 70s  BP Readings from Last 3 Encounters:  07/12/20 (!) 127/87  03/15/19 128/82  02/22/19 (!) 141/82   Renal function as follows:  Lab Results  Component Value Date   CREATININE 1.25 08/09/2020   CREATININE 1.06 05/01/2020   CREATININE 1.12 12/25/2019        Hyperlipidemia: taking pravastatin  80 mg LDL is consistently below 100   Lab Results  Component Value Date   CHOL 148 08/09/2020   HDL 41.20 08/09/2020   LDLCALC 73 08/09/2020   LDLDIRECT 108.0 12/09/2018   TRIG 168.0 (H) 08/09/2020   CHOLHDL 4 08/09/2020     Lab Results  Component Value Date   ALT 14 08/09/2020     NEUROPATHY He has had symptoms of neuropathy in feet.   No recurrence of symptoms recently  He  takes gabapentin  600 mg tid as before  Had a normal eye exam and was seen by ophthalmologist in 5/21   Physical Examination:  Wt 295 lb (133.8 kg)   BMI 40.01 kg/m       ASSESSMENT/PLAN:   Diabetes type 2 with obesity:  See history of present illness for detailed discussion of current diabetes management, blood sugar patterns and problems identified   A1c is back to 7% Currently taking Amaryl 1 mg before lunch, also Ozempic/Jardiance/metformin as before A1c has improved and likely from some weight loss recently even though he has not exercised Most of his blood sugars are fairly good except higher after dinner on weekends  Plan:  Encouraged him to exercise as able to, he can try to go in the pool at the [facility] when he can get an appointment  More blood sugars after evening meal  On the weekends he will take Amaryl before dinner instead of at 10 PM  Call if having consistently high or low readings  HYPERTENSION: Well-controlled with blood pressure checks elsewhere Minimal change in renal function  NEUROPATHY: Symptoms are well controlled  Lipids: Excellent control on pravastatin   Follow-up 4 months  There are no Patient Instructions on file for this visit.       Elayne Snare 08/12/2020, 9:36 AM

## 2020-08-27 ENCOUNTER — Other Ambulatory Visit: Payer: Self-pay | Admitting: Endocrinology

## 2020-09-09 ENCOUNTER — Other Ambulatory Visit: Payer: Self-pay | Admitting: Endocrinology

## 2020-09-23 ENCOUNTER — Other Ambulatory Visit: Payer: Self-pay | Admitting: Endocrinology

## 2020-10-05 ENCOUNTER — Other Ambulatory Visit: Payer: Self-pay | Admitting: Internal Medicine

## 2020-10-05 DIAGNOSIS — K2100 Gastro-esophageal reflux disease with esophagitis, without bleeding: Secondary | ICD-10-CM

## 2020-10-22 ENCOUNTER — Other Ambulatory Visit: Payer: Self-pay | Admitting: Endocrinology

## 2020-10-22 DIAGNOSIS — I10 Essential (primary) hypertension: Secondary | ICD-10-CM

## 2020-11-18 ENCOUNTER — Other Ambulatory Visit: Payer: Self-pay | Admitting: Endocrinology

## 2020-12-10 ENCOUNTER — Other Ambulatory Visit: Payer: Self-pay

## 2020-12-10 ENCOUNTER — Other Ambulatory Visit: Payer: Self-pay | Admitting: Endocrinology

## 2020-12-10 ENCOUNTER — Other Ambulatory Visit (INDEPENDENT_AMBULATORY_CARE_PROVIDER_SITE_OTHER): Payer: BC Managed Care – PPO

## 2020-12-10 DIAGNOSIS — E1142 Type 2 diabetes mellitus with diabetic polyneuropathy: Secondary | ICD-10-CM

## 2020-12-10 LAB — COMPREHENSIVE METABOLIC PANEL
ALT: 16 U/L (ref 0–53)
AST: 17 U/L (ref 0–37)
Albumin: 4.9 g/dL (ref 3.5–5.2)
Alkaline Phosphatase: 54 U/L (ref 39–117)
BUN: 18 mg/dL (ref 6–23)
CO2: 30 mEq/L (ref 19–32)
Calcium: 10.4 mg/dL (ref 8.4–10.5)
Chloride: 103 mEq/L (ref 96–112)
Creatinine, Ser: 1.3 mg/dL (ref 0.40–1.50)
GFR: 59.82 mL/min — ABNORMAL LOW (ref >60.00)
Glucose, Bld: 107 mg/dL — ABNORMAL HIGH (ref 70–99)
Potassium: 4.6 mEq/L (ref 3.5–5.1)
Sodium: 140 mEq/L (ref 135–145)
Total Bilirubin: 0.6 mg/dL (ref 0.2–1.2)
Total Protein: 8 g/dL (ref 6.0–8.3)

## 2020-12-10 LAB — HEMOGLOBIN A1C: Hgb A1c MFr Bld: 7.2 % — ABNORMAL HIGH (ref 4.6–6.5)

## 2020-12-16 ENCOUNTER — Other Ambulatory Visit: Payer: Self-pay | Admitting: Endocrinology

## 2020-12-16 NOTE — Progress Notes (Signed)
Patient ID: Jack Wallace, male   DOB: [birth year], 59 y.o.   MRN: [id number]   Reason for Appointment : Followup of  I connected with the above-named patient by video enabled telemedicine application and verified that I am speaking with the correct person. The patient was explained the limitations of evaluation and management by telemedicine and the availability of in person appointments.  Patient also understood that there may be a patient responsible charge related to this service . Location of the patient: Patient's home . Location of the provider: Physician office Only the patient and myself were participating in the encounter The patient understood the above statements and agreed to proceed.  History of Present Illness            Diagnosis: Type 2 diabetes mellitus, date of diagnosis: 1993         Past diabetes history: He has been treated with various drugs for his diabetes over the last several years. He has been taking metformin for at least 10 years and has been tolerating this. Over the years he has had additional medications to improve his control. He was also taking Actos previously and not clear if he had any side effects. This was stopped because of fear of long-term effects  In early 2014 he had poor control before he started walking and lost 15 pounds. Subsequently he was getting low blood sugar during the day especially before lunch and sometimes in the afternoon His glipizide was  reduced but even with 5 mg twice a day he was getting hypoglycemia.  This was reduced and Invokana added. Invokana was stopped because of lack of clear benefit in 09/2013 Because of inadequate control he was started on Victoza on his visit in 10/14 in addition to his metformin and glipizide. In 12/2017 he was started on Ozempic instead of Victoza  Recent history:   Non-insulin hypoglycemic drugs: Metformin Er 1500 mg daily,  Jardiance 25 in pm, Amaryl 1 mg before lunch, Ozempic 1.0 mg  weekly  His A1c is   7.0 and stable   Current problems, blood sugar patterns and management:  He checks his blood sugars mostly 3 to 4 hours after eating at about 10 PM at work and sometimes after his meal when he comes home but usually not fasting on any days including when he is off  Has only sporadic mild increase in blood sugar and not clear why his sugar was 261 on a weekend after his morning meal  He says he is doing some walking since he is not able to drive his car but otherwise not doing any formal exercise  Weight reportedly at his work exam was 291 pounds and lower  He is taking his glimepiride before his lunch at work and also at around 10 AM on his days off  Also takes Jardiance at the same time  He has been regular with Ozempic every Monday  No hypoglycemic symptoms   Meals at work: 1-3 am, at home 10-11 am or 3 PM  Monitors blood glucose:  1 times a day        Glucometer:  Contour  Blood Glucose readings from meter download/2 weeks   PRE-MEAL Fasting Lunch Dinner Bedtime Overall  Glucose range: ?    97, 147    Mean/median:      140   POST-MEAL PC Breakfast PC Lunch PC Dinner  Glucose range:   85-167  115-261  Mean/median:      Previous  readings:  PRE-MEAL Fasting Lunch Dinner Bedtime Overall  Glucose range:  76-108  114-146     Mean/median:   135   129   POST-MEAL PC Breakfast PC Lunch PC Dinner  Glucose range:   103-188  155, 202  Mean/median:   137  179     Dietician visit: Most recent: 5/19  Wt Readings from Last 3 Encounters:  08/12/20 295 lb (133.8 kg)  07/12/20 (!) 298 lb 3.2 oz (135.3 kg)  03/15/19 (!) 305 lb (138.3 kg)             Lab Results  Component Value Date   HGBA1C 7.2 (H) 12/10/2020   HGBA1C 7.0 (H) 08/09/2020   HGBA1C 7.3 (H) 05/01/2020   Lab Results  Component Value Date   MICROALBUR <0.7 12/25/2019   LDLCALC 73 08/09/2020   CREATININE 1.30 12/10/2020   Lab Results  Component Value Date   FRUCTOSAMINE 294  (H) 02/24/2018   FRUCTOSAMINE 314 (H) 11/25/2017   FRUCTOSAMINE 281 12/02/2016    OTHER active problems: See review of systems   Allergies as of 12/17/2020   No Known Allergies     Medication List       Accurate as of December 16, 2020  4:35 PM. If you have any questions, ask your nurse or doctor.        Alpha Lipoic Acid 200 MG Caps Take 200 mg by mouth. 2 pills daily   aspirin 81 MG tablet Take 81 mg by mouth daily.   Bayer Microlet Lancets lancets Use to check blood sugar two times per day   CONTOUR NEXT EZ MONITOR w/Device Kit Use to check blood sugar two times per day   Contour Next Test test strip Generic drug: glucose blood TEST TWICE DAILY   diclofenac 75 MG EC tablet Commonly known as: VOLTAREN TAKE 1 TABLET BY MOUTH TWICE DAILY WITH FOOD   gabapentin 600 MG tablet Commonly known as: NEURONTIN TAKE 1 TABLET BY MOUTH THREE TIMES DAILY   glimepiride 1 MG tablet Commonly known as: AMARYL TAKE 2 TABLETS BY MOUTH AT BEDTIME   Jardiance 25 MG Tabs tablet Generic drug: empagliflozin TAKE 1 TABLET BY MOUTH EVERY DAY   losartan 100 MG tablet Commonly known as: COZAAR TAKE 1 TABLET BY MOUTH EVERY DAY   Magnesium Oxide 400 (240 Mg) MG Tabs Take 1 tablet by mouth 3 (three) times daily. Take 1 in the morning and 2 in the evening.   metFORMIN 500 MG 24 hr tablet Commonly known as: GLUCOPHAGE-XR TAKE 4 TABLETS BY MOUTH EVERY DAY WITH SUPPER   multivitamin with minerals Tabs tablet Take 1 tablet by mouth daily.   Ozempic (1 MG/DOSE) 4 MG/3ML Sopn Generic drug: Semaglutide (1 MG/DOSE) INJECT 1 MG INTO THE SKIN ONCE A WEEK   pantoprazole 40 MG tablet Commonly known as: PROTONIX TAKE 1 TABLET(40 MG) BY MOUTH TWICE DAILY   pravastatin 80 MG tablet Commonly known as: PRAVACHOL TAKE 1 TABLET(80 MG) BY MOUTH DAILY PATIENT WILL USE OF 40 MG SUPPLY FIRST   sertraline 100 MG tablet Commonly known as: ZOLOFT Take 2 tablets (200 mg total) by mouth daily.    vitamin B-12 500 MCG tablet Commonly known as: CYANOCOBALAMIN Take 500 mcg by mouth daily.   vitamin C 1000 MG tablet Take 1,000 mg by mouth daily.   VITAMIN D PO Take 1 tablet by mouth daily. D3 1000 IU   VITAMIN E PO Take 1 capsule by mouth daily. 400 IU daily  Allergies: No Known Allergies  Past Medical History:  Diagnosis Date  . Depression   . Diabetes mellitus without complication ([place])   . Hyperlipidemia   . Hypertension   . Ischemic colitis [facility])     Past Surgical History:  Procedure Laterality Date  . COLONOSCOPY  06/01/2014  . NASAL SEPTUM SURGERY      Family History  Problem Relation Age of Onset  . Hypertension Mother   . Stroke Father   . Diabetes Maternal Grandfather   . Colon cancer Neg Hx   . Throat cancer Neg Hx   . Prostate cancer Neg Hx   . Pancreatic cancer Neg Hx   . Heart disease Neg Hx   . Kidney disease Neg Hx   . Liver disease Neg Hx     Social History:  reports that he has never smoked. He has never used smokeless tobacco. He reports that he does not drink alcohol and does not use drugs.    Review of Systems      HYPERTENSION: Taking 100 mg losartan Also on Jardiance  He is getting his blood pressure checked at work  Blood pressure consistently in the 120s and 70s  BP Readings from Last 3 Encounters:  07/12/20 (!) 127/87  03/15/19 128/82  02/22/19 (!) 141/82   Renal function as follows:  Lab Results  Component Value Date   CREATININE 1.30 12/10/2020   CREATININE 1.25 08/09/2020   CREATININE 1.06 05/01/2020        Hyperlipidemia: taking pravastatin  80 mg LDL is consistently below 100   Lab Results  Component Value Date   CHOL 148 08/09/2020   HDL 41.20 08/09/2020   LDLCALC 73 08/09/2020   LDLDIRECT 108.0 12/09/2018   TRIG 168.0 (H) 08/09/2020   CHOLHDL 4 08/09/2020     Lab Results  Component Value Date   ALT 16 12/10/2020     NEUROPATHY He has had symptoms of neuropathy in feet.     He   takes gabapentin 600 mg 3 times a day with good control of symptoms  Had a normal eye exam and was seen by ophthalmologist in 5/21  Foot exam done by PCP reportedly   Physical Examination:  There were no vitals taken for this visit.      ASSESSMENT/PLAN:   Diabetes type 2 with obesity:  See history of present illness for detailed discussion of current diabetes management, blood sugar patterns and problems identified   A1c is mildly increased at 7.2  Currently taking Amaryl 1 mg before lunch, along with Ozempic/Jardiance/metformin maximum doses  Although his blood sugars are fairly good at home and averaging about 140 recently he may have occasional high postprandial readings Also not checking readings by rotation at different times including fasting He is able to keep his weight down and recently has done some more walking   Plan:  Encouraged him to do brisk walking in addition to any other walking he may be doing  Check blood sugars by rotation at different times and also fasting  Postprandial readings need to be done 2 hours after eating  Balanced meals with some protein with every meal  Call if having consistently high or low readings  No changes in the dosage of medications  HYPERTENSION: Fair control and need to confirm his blood pressure control with office reading as he appears to have diastolic readings over 80 this year  Renal function: Creatinine is high normal and will continue to monitor, GFR just under 60  NEUROPATHY: Symptoms are controlled  Immunizations: He is up-to-date with influenza and Covid booster  Follow-up 4 months  There are no Patient Instructions on file for this visit.       Elayne Snare 12/16/2020, 4:35 PM

## 2020-12-17 ENCOUNTER — Other Ambulatory Visit: Payer: Self-pay

## 2020-12-17 ENCOUNTER — Telehealth (INDEPENDENT_AMBULATORY_CARE_PROVIDER_SITE_OTHER): Payer: BC Managed Care – PPO | Admitting: Endocrinology

## 2020-12-17 ENCOUNTER — Encounter: Payer: Self-pay | Admitting: Endocrinology

## 2020-12-17 DIAGNOSIS — E7800 Pure hypercholesterolemia, unspecified: Secondary | ICD-10-CM

## 2020-12-17 DIAGNOSIS — E1165 Type 2 diabetes mellitus with hyperglycemia: Secondary | ICD-10-CM | POA: Diagnosis not present

## 2020-12-17 DIAGNOSIS — I10 Essential (primary) hypertension: Secondary | ICD-10-CM | POA: Diagnosis not present

## 2021-01-08 ENCOUNTER — Other Ambulatory Visit: Payer: Self-pay | Admitting: Endocrinology

## 2021-01-20 ENCOUNTER — Other Ambulatory Visit: Payer: Self-pay | Admitting: Endocrinology

## 2021-01-31 ENCOUNTER — Ambulatory Visit: Payer: BC Managed Care – PPO | Admitting: Endocrinology

## 2021-02-10 ENCOUNTER — Other Ambulatory Visit: Payer: Self-pay | Admitting: Endocrinology

## 2021-02-24 ENCOUNTER — Other Ambulatory Visit: Payer: Self-pay | Admitting: Endocrinology

## 2021-03-10 ENCOUNTER — Other Ambulatory Visit: Payer: Self-pay | Admitting: Endocrinology

## 2021-04-07 ENCOUNTER — Other Ambulatory Visit: Payer: Self-pay | Admitting: Endocrinology

## 2021-04-15 ENCOUNTER — Other Ambulatory Visit: Payer: Self-pay

## 2021-04-15 ENCOUNTER — Other Ambulatory Visit (INDEPENDENT_AMBULATORY_CARE_PROVIDER_SITE_OTHER): Payer: BC Managed Care – PPO

## 2021-04-15 DIAGNOSIS — E1165 Type 2 diabetes mellitus with hyperglycemia: Secondary | ICD-10-CM

## 2021-04-15 DIAGNOSIS — E7800 Pure hypercholesterolemia, unspecified: Secondary | ICD-10-CM

## 2021-04-15 DIAGNOSIS — E1142 Type 2 diabetes mellitus with diabetic polyneuropathy: Secondary | ICD-10-CM

## 2021-04-15 LAB — COMPREHENSIVE METABOLIC PANEL
ALT: 15 U/L (ref 0–53)
AST: 19 U/L (ref 0–37)
Albumin: 4.7 g/dL (ref 3.5–5.2)
Alkaline Phosphatase: 50 U/L (ref 39–117)
BUN: 18 mg/dL (ref 6–23)
CO2: 28 mEq/L (ref 19–32)
Calcium: 10.4 mg/dL (ref 8.4–10.5)
Chloride: 104 mEq/L (ref 96–112)
Creatinine, Ser: 1.17 mg/dL (ref 0.40–1.50)
GFR: 67.72 mL/min (ref >60.00)
Glucose, Bld: 83 mg/dL (ref 70–99)
Potassium: 4.3 mEq/L (ref 3.5–5.1)
Sodium: 141 mEq/L (ref 135–145)
Total Bilirubin: 0.6 mg/dL (ref 0.2–1.2)
Total Protein: 7.8 g/dL (ref 6.0–8.3)

## 2021-04-15 LAB — LIPID PANEL
Cholesterol: 145 mg/dL (ref 0–200)
HDL: 44.8 mg/dL (ref >39.00)
LDL Cholesterol: 63 mg/dL (ref 0–99)
NonHDL: 99.83
Total CHOL/HDL Ratio: 3
Triglycerides: 185 mg/dL — ABNORMAL HIGH (ref 0.0–149.0)
VLDL: 37 mg/dL (ref 0.0–40.0)

## 2021-04-15 LAB — HEMOGLOBIN A1C: Hgb A1c MFr Bld: 7 % — ABNORMAL HIGH (ref 4.6–6.5)

## 2021-04-17 ENCOUNTER — Other Ambulatory Visit: Payer: Self-pay

## 2021-04-17 ENCOUNTER — Ambulatory Visit: Payer: BC Managed Care – PPO | Admitting: Endocrinology

## 2021-04-17 DIAGNOSIS — I10 Essential (primary) hypertension: Secondary | ICD-10-CM

## 2021-04-17 DIAGNOSIS — E1169 Type 2 diabetes mellitus with other specified complication: Secondary | ICD-10-CM | POA: Diagnosis not present

## 2021-04-17 DIAGNOSIS — E7800 Pure hypercholesterolemia, unspecified: Secondary | ICD-10-CM

## 2021-04-17 NOTE — Patient Instructions (Addendum)
Limit hot tub to 5-6 min  Check blood sugars on waking up 3 days a week  Also check blood sugars about 2 hours after meals and do this after different meals by rotation  Recommended blood sugar levels on waking up are 90-130 and about 2 hours after meal is 130-160  Please bring your blood sugar monitor to each visit, thank you  Take 1mg  Amaryl

## 2021-04-17 NOTE — Progress Notes (Signed)
Patient ID: Jack Wallace, male   DOB: [birth year], 60 y.o.   MRN: [id number]   Reason for Appointment : Followup of    History of Present Illness            Diagnosis: Type 2 diabetes mellitus, date of diagnosis: 1993         Past diabetes history: He has been treated with various drugs for his diabetes over the last several years. He has been taking metformin for at least 10 years and has been tolerating this. Over the years he has had additional medications to improve his control. He was also taking Actos previously and not clear if he had any side effects. This was stopped because of fear of long-term effects  In early 2014 he had poor control before he started walking and lost 15 pounds. Subsequently he was getting low blood sugar during the day especially before lunch and sometimes in the afternoon His glipizide was  reduced but even with 5 mg twice a day he was getting hypoglycemia.  This was reduced and Invokana added. Invokana was stopped because of lack of clear benefit in 09/2013 Because of inadequate control he was started on Victoza on his visit in 10/14 in addition to his metformin and glipizide. In 12/2017 he was started on Ozempic instead of Victoza  Recent history:   Non-insulin hypoglycemic drugs: Metformin Er 1500 mg daily,  Jardiance 25 in pm, Amaryl 2 mg before lunch, Ozempic 1.0 mg weekly  His A1c is   7.0 and stable   Current problems, blood sugar patterns and management:  He checks his blood sugars about once a day on an average either after lunch at work which is his largest meal or fasting when he is off  Blood sugars are not fluctuating as much recently although he thinks occasionally he has had a reading around 190  He has started going to the gym which he does after work  With this he has lost weight  His lab glucose was 83 although his fasting readings at home have been as high as 137  Not clear if he is having any hypoglycemia after his  exercise as he only has some lightheadedness, may have had occasional blood sugars in the 70s or 80s At times, no recent documentation  He has been regular with Ozempic every Monday  No nausea with this   Meals at work: 1-3 am, at home 10-11 am or 3 PM  Monitors blood glucose:  1 times a day        Glucometer:  Contour  Blood Glucose readings from meter download/2 weeks  AVERAGE after lunch 135 AVERAGE fasting about 120 Overall AVERAGE 126   PREVIOUSLY:  PRE-MEAL Fasting Lunch Dinner Bedtime Overall  Glucose range: ?    97, 147    Mean/median:      140   POST-MEAL PC Breakfast PC Lunch PC Dinner  Glucose range:   85-167  115-261  Mean/median:       Dietician visit: Most recent: 5/19  Wt Readings from Last 3 Encounters:  04/17/21 285 lb (129.3 kg)  08/12/20 295 lb (133.8 kg)  07/12/20 (!) 298 lb 3.2 oz (135.3 kg)             Lab Results  Component Value Date   HGBA1C 7.0 (H) 04/15/2021   HGBA1C 7.2 (H) 12/10/2020   HGBA1C 7.0 (H) 08/09/2020   Lab Results  Component Value Date   MICROALBUR <0.7 12/25/2019  LDLCALC 63 04/15/2021   CREATININE 1.17 04/15/2021   Lab Results  Component Value Date   FRUCTOSAMINE 294 (H) 02/24/2018   FRUCTOSAMINE 314 (H) 11/25/2017   FRUCTOSAMINE 281 12/02/2016    OTHER active problems: See review of systems   Allergies as of 04/17/2021   No Known Allergies     Medication List       Accurate as of April 17, 2021  8:52 AM. If you have any questions, ask your nurse or doctor.        Alpha Lipoic Acid 200 MG Caps Take 200 mg by mouth. 2 pills daily   aspirin 81 MG tablet Take 81 mg by mouth daily.   Bayer Microlet Lancets lancets Use to check blood sugar two times per day   CONTOUR NEXT EZ MONITOR w/Device Kit Use to check blood sugar two times per day   Contour Next Test test strip Generic drug: glucose blood TEST TWICE DAILY   diclofenac 75 MG EC tablet Commonly known as: VOLTAREN TAKE 1 TABLET BY MOUTH  TWICE DAILY WITH FOOD   Fish Oil 500 MG Caps Take by mouth 3 (three) times daily.   gabapentin 600 MG tablet Commonly known as: NEURONTIN TAKE 1 TABLET BY MOUTH THREE TIMES DAILY   glimepiride 1 MG tablet Commonly known as: AMARYL TAKE 2 TABLETS BY MOUTH AT BEDTIME   Jardiance 25 MG Tabs tablet Generic drug: empagliflozin TAKE 1 TABLET BY MOUTH EVERY DAY   losartan 100 MG tablet Commonly known as: COZAAR TAKE 1 TABLET BY MOUTH EVERY DAY   magnesium oxide 400 (240 Mg) MG Tabs Take 1 tablet by mouth 3 (three) times daily. Take 1 in the morning and 2 in the evening.   metFORMIN 500 MG 24 hr tablet Commonly known as: GLUCOPHAGE-XR TAKE 4 TABLETS BY MOUTH EVERY DAY WITH SUPPER   multivitamin with minerals Tabs tablet Take 1 tablet by mouth daily.   Ozempic (1 MG/DOSE) 4 MG/3ML Sopn Generic drug: Semaglutide (1 MG/DOSE) INJECT 1 MG INTO THE SKIN ONCE A WEEK.   pantoprazole 40 MG tablet Commonly known as: PROTONIX TAKE 1 TABLET(40 MG) BY MOUTH TWICE DAILY   pravastatin 80 MG tablet Commonly known as: PRAVACHOL TAKE 1 TABLET(80 MG) BY MOUTH DAILY PATIENT WILL USE OF 40 MG SUPPLY FIRST   sertraline 100 MG tablet Commonly known as: ZOLOFT Take 2 tablets (200 mg total) by mouth daily.   vitamin A 25000 UNIT capsule Take 25,000 Units by mouth daily.   vitamin B-12 500 MCG tablet Commonly known as: CYANOCOBALAMIN Take 500 mcg by mouth daily.   vitamin C 1000 MG tablet Take 1,000 mg by mouth daily.   VITAMIN D PO Take 1 tablet by mouth daily. D3 1000 IU   VITAMIN E PO Take 1 capsule by mouth daily. 400 IU daily       Allergies: No Known Allergies  Past Medical History:  Diagnosis Date  . Depression   . Diabetes mellitus without complication ([place])   . Hyperlipidemia   . Hypertension   . Ischemic colitis [facility])     Past Surgical History:  Procedure Laterality Date  . COLONOSCOPY  06/01/2014  . NASAL SEPTUM SURGERY      Family History  Problem Relation  Age of Onset  . Hypertension Mother   . Stroke Father   . Diabetes Maternal Grandfather   . Colon cancer Neg Hx   . Throat cancer Neg Hx   . Prostate cancer Neg Hx   .  Pancreatic cancer Neg Hx   . Heart disease Neg Hx   . Kidney disease Neg Hx   . Liver disease Neg Hx     Social History:  reports that he has never smoked. He has never used smokeless tobacco. He reports that he does not drink alcohol and does not use drugs.    Review of Systems      HYPERTENSION: Taking 100 mg losartan Also on Jardiance  He is getting his blood pressure checked at work  Blood pressure consistently in the 120s and 70s  BP Readings from Last 3 Encounters:  04/17/21 132/86  07/12/20 (!) 127/87  03/15/19 128/82   Renal function as follows:  Lab Results  Component Value Date   CREATININE 1.17 04/15/2021   CREATININE 1.30 12/10/2020   CREATININE 1.25 08/09/2020        Hyperlipidemia: taking pravastatin  80 mg LDL is consistently below 100   Lab Results  Component Value Date   CHOL 145 04/15/2021   HDL 44.80 04/15/2021   LDLCALC 63 04/15/2021   LDLDIRECT 108.0 12/09/2018   TRIG 185.0 (H) 04/15/2021   CHOLHDL 3 04/15/2021     Lab Results  Component Value Date   ALT 15 04/15/2021     NEUROPATHY He has had symptoms of neuropathy in feet.     He  takes gabapentin 600 mg 3 times a day with good control of symptoms  Had a normal eye exam and was seen by ophthalmologist in 5/21  Foot exam done by PCP    Physical Examination:  BP 132/86   Pulse 83   Ht 6' (1.829 m)   Wt 285 lb (129.3 kg)   SpO2 95%   BMI 38.65 kg/m      ASSESSMENT/PLAN:   Diabetes type 2 with obesity:  See history of present illness for detailed discussion of current diabetes management, blood sugar patterns and problems identified   A1c is fairly good at 7  Currently taking Amaryl 2 mg before lunch, along with Ozempic/Jardiance/metformin maximum doses  His blood sugars at home are  recently fairly consistent with highest reading only 157 With his main meal at lunchtime at work his blood sugars recently are not going up significantly Also fasting readings are as low as 83 on the lab He may feel a little hypoglycemic after exercise in the mornings Weight has improved   Plan:  Amaryl only 1 mg before lunch  He will let us know if the blood sugars start going up  Continue exercise and prudent diet  HYPERTENSION: Fair control, may have relatively high readings in the office has blood pressure readings at work are fairly good  We will continue the same medications for now  Renal function: Creatinine is improved  Hypercholesterolemia: LDL is excellent at 63 although triglyceride mildly increased  Follow-up 4 months  There are no Patient Instructions on file for this visit.       Elayne Snare 04/17/2021, 8:52 AM

## 2021-04-21 ENCOUNTER — Other Ambulatory Visit: Payer: Self-pay | Admitting: Endocrinology

## 2021-04-21 DIAGNOSIS — I10 Essential (primary) hypertension: Secondary | ICD-10-CM

## 2021-05-05 ENCOUNTER — Other Ambulatory Visit: Payer: Self-pay | Admitting: Endocrinology

## 2021-05-15 DIAGNOSIS — H2513 Age-related nuclear cataract, bilateral: Secondary | ICD-10-CM | POA: Diagnosis not present

## 2021-05-15 DIAGNOSIS — H52203 Unspecified astigmatism, bilateral: Secondary | ICD-10-CM | POA: Diagnosis not present

## 2021-05-15 DIAGNOSIS — Z7984 Long term (current) use of oral hypoglycemic drugs: Secondary | ICD-10-CM | POA: Diagnosis not present

## 2021-05-15 DIAGNOSIS — H5213 Myopia, bilateral: Secondary | ICD-10-CM | POA: Diagnosis not present

## 2021-05-15 DIAGNOSIS — E1136 Type 2 diabetes mellitus with diabetic cataract: Secondary | ICD-10-CM | POA: Diagnosis not present

## 2021-05-15 DIAGNOSIS — H524 Presbyopia: Secondary | ICD-10-CM | POA: Diagnosis not present

## 2021-05-15 LAB — HM DIABETES EYE EXAM

## 2021-06-02 ENCOUNTER — Other Ambulatory Visit: Payer: Self-pay | Admitting: Endocrinology

## 2021-06-22 ENCOUNTER — Other Ambulatory Visit: Payer: Self-pay | Admitting: Endocrinology

## 2021-06-24 ENCOUNTER — Encounter: Payer: Self-pay | Admitting: *Deleted

## 2021-06-30 ENCOUNTER — Other Ambulatory Visit: Payer: Self-pay | Admitting: Endocrinology

## 2021-07-03 ENCOUNTER — Encounter: Payer: Self-pay | Admitting: Internal Medicine

## 2021-07-03 ENCOUNTER — Other Ambulatory Visit: Payer: Self-pay

## 2021-07-03 ENCOUNTER — Ambulatory Visit: Payer: BC Managed Care – PPO | Admitting: Internal Medicine

## 2021-07-03 VITALS — BP 124/84 | HR 87 | Temp 98.5°F | Ht 72.0 in | Wt 288.6 lb

## 2021-07-03 DIAGNOSIS — E785 Hyperlipidemia, unspecified: Secondary | ICD-10-CM

## 2021-07-03 DIAGNOSIS — K219 Gastro-esophageal reflux disease without esophagitis: Secondary | ICD-10-CM

## 2021-07-03 DIAGNOSIS — E1142 Type 2 diabetes mellitus with diabetic polyneuropathy: Secondary | ICD-10-CM | POA: Diagnosis not present

## 2021-07-03 DIAGNOSIS — E1159 Type 2 diabetes mellitus with other circulatory complications: Secondary | ICD-10-CM

## 2021-07-03 DIAGNOSIS — E1149 Type 2 diabetes mellitus with other diabetic neurological complication: Secondary | ICD-10-CM

## 2021-07-03 DIAGNOSIS — Z0000 Encounter for general adult medical examination without abnormal findings: Secondary | ICD-10-CM | POA: Diagnosis not present

## 2021-07-03 DIAGNOSIS — I152 Hypertension secondary to endocrine disorders: Secondary | ICD-10-CM

## 2021-07-03 DIAGNOSIS — F321 Major depressive disorder, single episode, moderate: Secondary | ICD-10-CM

## 2021-07-03 LAB — POCT GLYCOSYLATED HEMOGLOBIN (HGB A1C): Hemoglobin A1C: 7.1 % — AB (ref 4.0–5.6)

## 2021-07-03 LAB — GLUCOSE, CAPILLARY: Glucose-Capillary: 106 mg/dL — ABNORMAL HIGH (ref 70–99)

## 2021-07-03 NOTE — Assessment & Plan Note (Signed)
POC HbA1c of 7.1% today (7.0% in April 2022). Patient reports one episode of hypoglycemia but states that he had a snack and juice when he noticed his sugar was low and this alleviated his symptoms. He is currently managed by Dr. Dwyane Dee who he says he has an appointment with in August.

## 2021-07-03 NOTE — Assessment & Plan Note (Signed)
Patient reports that he has received the complete Pfizer COVID-19 vaccine series, pneumonia vaccine, and shingles vaccine series. He has also had his annual eye exam 05/15/2021.

## 2021-07-03 NOTE — Assessment & Plan Note (Signed)
PHQ-9 of 9 in office today which he states is his baseline despite occasional episodes of increased feelings of depression, but he reports that these are not frequent or unmanageable. - Continue sertraline 100 mg

## 2021-07-03 NOTE — Assessment & Plan Note (Signed)
Patient denies current complaints of acid reflux. He is prescribed pantoprazole 40 mg for reflux symptoms.

## 2021-07-03 NOTE — Assessment & Plan Note (Signed)
Patient endorses some trouble with neuropathy of his R leg but states it isn't bothering him much at this time. I did note decreased sensation of the lateral aspect of his R foot on exam.  - Continue gabapentin 600 mg 1 tablet in the morning and 2 tablets at night.

## 2021-07-03 NOTE — Assessment & Plan Note (Signed)
Last lipid panel in April 2022 was unremarkable except for elevated triglycerides. He is not due for repeat testing at this time. Continue pravastatin 80 mg. Denies any adverse side effects.

## 2021-07-03 NOTE — Assessment & Plan Note (Signed)
BP remains well controlled with today's reading of 124/84. He denies episodes of dizziness, weakness, or fainting sensation outside of when using the whirlpool. Counseled patient on whirlpool use, especially combined with immediate immersion in water of a much colder temperature, and how it can worsen symptoms of hypotension. He voiced understanding. - Continue losartan 100 mg daily

## 2021-07-03 NOTE — Progress Notes (Signed)
CC: check up for DM, HTN, HLD  HPI:  Jack Wallace is a 60 y.o. male with past medical history below who presents for follow-up of diabetes mellitus, hypertension, and hyperlipidemia. Also discussed includes neuropathy, depression, and GERD.  He states that he has been doing well with current medication regimen for diabetes. He does report one episode of hypoglycemia (78) at which time he had a snack and felt better. He states that he makes an effort to eat a healthy diet but does admit that this can be difficult with his job working the third shift. He exercises 3-4 days per week for about 45 minutes by swimming and being in the pool. He also notes that he has noticed a blood sugar drop around 3 hours after eating from time to time but that he hasn't noticed this problem in the last 3-4 months. He does endorse neuropathy particularly in his right leg but says that it's not particularly bothersome for him at this time. He does regular self foot checks and has not noticed any new wounds or skin breakdown.  In clinic today his BP was great at 124/84. He does admit to episodes of dizziness, weakness, and near-fainting sensation when he uses the whirlpool at his gym and goes straight to a cold shower or even the regular pool. He does not report these symptoms at any other time.   Last lipid panel collected in April 2022 was unremarkable except for elevated triglycerides. The patient continues to take pravastatin 80 mg daily and denies any adverse side effects including GI upset or myalgias.   PHQ-9 score of 9 today which the patient states is normal for him. He does report difficulty sleeping frequently however attributes this to working third shift and sleeping during the day when there is more noise and light to make sleeping more difficult. He does say that he has episodes of worsening depression symptoms but that they are infrequent and manageable. He feels that sertraline is adequately  controlling his depression at this time.  He is not currently experiencing problems with GERD and is not taking pantoprazole 40 mg at this time.  Past Medical History:  Diagnosis Date   Depression    Diabetes mellitus without complication ([place])    Hyperlipidemia    Hypertension    Ischemic colitis ([place])    Review of Systems:  Review of Systems  Constitutional:  Positive for weight loss (intentional). Negative for chills and fever.  Respiratory:  Negative for cough, shortness of breath and wheezing.   Cardiovascular:  Negative for chest pain, orthopnea and leg swelling.  Gastrointestinal:  Positive for nausea (occasional). Negative for abdominal pain, constipation, diarrhea and heartburn.  Musculoskeletal:  Positive for joint pain. Negative for myalgias.  Neurological:  Positive for dizziness (associated with whirlpool use) and weakness (associated with whirlpool use). Negative for loss of consciousness and headaches.  Psychiatric/Behavioral:  Positive for depression (unchanged from baseline).     Physical Exam:  Vitals:   07/03/21 0851  BP: 124/84  Pulse: 87  Temp: 98.5 F (36.9 C)  TempSrc: Oral  SpO2: 96%  Weight: 288 lb 9.6 oz (130.9 kg)  Height: 6' (1.829 m)   Physical Exam Constitutional:      Appearance: Normal appearance.  Cardiovascular:     Rate and Rhythm: Normal rate and regular rhythm.     Pulses:          Radial pulses are 2+ on the right side and 2+ on the left  side.       Dorsalis pedis pulses are 2+ on the right side and 2+ on the left side.     Heart sounds: Normal heart sounds.  Pulmonary:     Effort: Pulmonary effort is normal.     Breath sounds: Normal breath sounds and air entry.  Abdominal:     Palpations: Abdomen is soft.     Tenderness: There is no abdominal tenderness.  Musculoskeletal:     Right lower leg: No edema.     Left lower leg: No edema.  Feet:     Right foot:     Protective Sensation: 9 sites tested.  7 sites sensed.     Skin  integrity: Skin integrity normal.     Toenail Condition: Right toenails are normal.     Left foot:     Protective Sensation: 9 sites tested.  9 sites sensed.     Skin integrity: Skin integrity normal.     Toenail Condition: Left toenails are normal.  Skin:    General: Skin is warm and dry.  Psychiatric:        Mood and Affect: Mood and affect normal.        Behavior: Behavior is cooperative.     Assessment & Plan:   See Encounters Tab for problem based charting.  Patient seen with Dr. Daryll Drown

## 2021-07-03 NOTE — Patient Instructions (Addendum)
Thank you for visiting the Internal Medicine Clinic today. It was a pleasure to meet you!  Today we discussed your blood pressure, diabetes, depression, acid reflux, neuropathy, and high cholesterol. Your blood pressure was great at 124/84 today! Continue taking losartan 100 mg daily. As we discussed, be cautious with using any sort of whirlpool and rapidly moving from one temperature extreme to another, like from the whirlpool to a cold shower, as this can make feelings of weakness or dizziness prominent. Today your HbA1c was 7.1% and blood glucose was 106. Your diabetes appears to be stable at this time. Continue to exercise and make mindful decisions about your diet. Continue taking your glimepiride 1 mg, jardiance 25 mg, metformin 1000 mg twice per day, and ozempic 1 mg injection once weekly. I am happy to hear that your depression is being well managed with sertraline! Please do not hesitate to let us know if this changes. We want to help you continue to feel your best! I am glad to hear that your previous problems with acid reflux are remaining at [place] for now. You are prescribed pantoprazole 40 mg for reflux  Your right leg seems to be giving you the hardest time with neuropathy and I noted some decreased sensation to the outside of the bottom of your left foot. Continue to take gabapentin for neuropathy as well as complete regular foot checks to make sure there are new wounds or sores. You are not yet due for a recheck of your cholesterol. Please continue to take pravastatin 80 mg.  I would like to follow-up with you in 6 months. If you have any questions or concerns, please call our clinic at [phone] between 9am-5pm and after hours call [phone] and ask for the internal medicine resident on call. If you feel you are having a medical emergency please call 911.  Dr. Marlou Sa

## 2021-07-14 NOTE — Progress Notes (Signed)
Internal Medicine Clinic Attending  I saw and evaluated the patient.  I personally confirmed the key portions of the history and exam documented by Dr.  Dean  and I reviewed pertinent patient test results.  The assessment, diagnosis, and plan were formulated together and I agree with the documentation in the resident's note.  

## 2021-07-18 ENCOUNTER — Other Ambulatory Visit (INDEPENDENT_AMBULATORY_CARE_PROVIDER_SITE_OTHER): Payer: BC Managed Care – PPO

## 2021-07-18 ENCOUNTER — Other Ambulatory Visit: Payer: Self-pay

## 2021-07-18 DIAGNOSIS — E1142 Type 2 diabetes mellitus with diabetic polyneuropathy: Secondary | ICD-10-CM

## 2021-07-18 DIAGNOSIS — E1169 Type 2 diabetes mellitus with other specified complication: Secondary | ICD-10-CM | POA: Diagnosis not present

## 2021-07-18 LAB — BASIC METABOLIC PANEL
BUN: 15 mg/dL (ref 6–23)
CO2: 28 mEq/L (ref 19–32)
Calcium: 10 mg/dL (ref 8.4–10.5)
Chloride: 103 mEq/L (ref 96–112)
Creatinine, Ser: 1.3 mg/dL (ref 0.40–1.50)
GFR: 59.57 mL/min — ABNORMAL LOW (ref >60.00)
Glucose, Bld: 93 mg/dL (ref 70–99)
Potassium: 4.5 mEq/L (ref 3.5–5.1)
Sodium: 140 mEq/L (ref 135–145)

## 2021-07-18 LAB — HEMOGLOBIN A1C: Hgb A1c MFr Bld: 7.4 % — ABNORMAL HIGH (ref 4.6–6.5)

## 2021-07-21 ENCOUNTER — Other Ambulatory Visit: Payer: Self-pay | Admitting: Endocrinology

## 2021-07-22 ENCOUNTER — Encounter: Payer: Self-pay | Admitting: Endocrinology

## 2021-07-22 ENCOUNTER — Ambulatory Visit: Payer: BC Managed Care – PPO | Admitting: Endocrinology

## 2021-07-22 ENCOUNTER — Other Ambulatory Visit: Payer: Self-pay

## 2021-07-22 VITALS — BP 126/88 | HR 83 | Ht 72.0 in | Wt 287.8 lb

## 2021-07-22 DIAGNOSIS — I10 Essential (primary) hypertension: Secondary | ICD-10-CM

## 2021-07-22 DIAGNOSIS — E7800 Pure hypercholesterolemia, unspecified: Secondary | ICD-10-CM

## 2021-07-22 DIAGNOSIS — E1142 Type 2 diabetes mellitus with diabetic polyneuropathy: Secondary | ICD-10-CM

## 2021-07-22 DIAGNOSIS — E1165 Type 2 diabetes mellitus with hyperglycemia: Secondary | ICD-10-CM

## 2021-07-22 LAB — MICROALBUMIN / CREATININE URINE RATIO
Creatinine,U: 104.6 mg/dL
Microalb Creat Ratio: 0.7 mg/g (ref 0.0–30.0)
Microalb, Ur: <0.7 mg/dL (ref 0.0–1.9)

## 2021-07-22 MED ORDER — PREGABALIN 150 MG PO CAPS: 150.0000 mg | ORAL_CAPSULE | Freq: Two times a day (BID) | ORAL | 2 refills | Status: DC

## 2021-07-22 NOTE — Patient Instructions (Addendum)
Reduce Diclofenac as much as possible  Check blood sugars on waking up  3 days a week  Also check blood sugars about 2 hours after meals and do this after different meals by rotation  Recommended blood sugar levels on waking up are 90-130 and about 2 hours after meal is 130-160  Please bring your blood sugar monitor to each visit, thank you  Try delaying Ozempic next week to see if nausea less

## 2021-07-22 NOTE — Addendum Note (Signed)
Addended by: Kaylyn Lim I on: 07/22/2021 10:44 AM   Modules accepted: Orders

## 2021-07-22 NOTE — Progress Notes (Signed)
Patient ID: Jack Wallace, male   DOB: 01/16/1961, 60 y.o.   MRN: [id number]   Reason for Appointment : Followup     History of Present Illness            Diagnosis: Type 2 diabetes mellitus, date of diagnosis: 1993         Past diabetes history: He has been treated with various drugs for his diabetes over the last several years. He has been taking metformin for at least 10 years and has been tolerating this. Over the years he has had additional medications to improve his control. He was also taking Actos previously and not clear if he had any side effects. This was stopped because of fear of long-term effects  In early 2014 he had poor control before he started walking and lost 15 pounds. Subsequently he was getting low blood sugar during the day especially before lunch and sometimes in the afternoon His glipizide was  reduced but even with 5 mg twice a day he was getting hypoglycemia.  This was reduced and Invokana added. Invokana was stopped because of lack of clear benefit in 09/2013 Because of inadequate control he was started on Victoza on his visit in 10/14 in addition to his metformin and glipizide. In 12/2017 he was started on Ozempic instead of Victoza  Recent history:   Non-insulin hypoglycemic drugs: Metformin Er 1500 mg daily,  Jardiance 25 in pm, Amaryl 1 mg before lunch, Ozempic 1.0 mg weekly  His A1c is   7.4 compared to 7.0    Current problems, blood sugar patterns and management: He was told to reduce Amaryl on the last visit because of tendency to occasional hypoglycemia especially with exercise  Even with 1 mg Amaryl he still has occasionally low normal readings and once felt hypoglycemic while eating lunch  He is however not checking readings frequently after meals especially in the evening  Also not checking fasting readings when he wakes up in the evening  Because of the fall he has not exercised for the last month  However her weight is about the same   Does not think he has changed his diet  His highest blood sugars 184 and only occasionally will have slightly higher readings before lunch or his midday meal He has been regular with Ozempic every Monday No nausea with this previously but now he thinks he has had periodically more nausea, he recently discussed with his PCP   Meals at work: 1-3 am, at home 10-11 am or 3 PM  Monitors blood glucose:  1 times a day        Glucometer:  Contour  Blood Glucose readings from meter download/2 weeks   PRE-MEAL Overnight Mornings Dinner Bedtime Overall  Glucose range: 69-184 116-159     Mean/median: 134 135   135   POST-MEAL PC Breakfast PC Lunch PC Dinner  Glucose range:   ?  Mean/median:      Previously:  AVERAGE after lunch 135 AVERAGE fasting about 120 Overall AVERAGE 126   Dietician visit: Most recent: 5/19  Wt Readings from Last 3 Encounters:  07/22/21 287 lb 12.8 oz (130.5 kg)  07/03/21 288 lb 9.6 oz (130.9 kg)  04/17/21 285 lb (129.3 kg)             Lab Results  Component Value Date   HGBA1C 7.4 (H) 07/18/2021   HGBA1C 7.1 (A) 07/03/2021   HGBA1C 7.0 (H) 04/15/2021   Lab Results  Component  Value Date   MICROALBUR <0.7 12/25/2019   LDLCALC 63 04/15/2021   CREATININE 1.30 07/18/2021   Lab Results  Component Value Date   FRUCTOSAMINE 294 (H) 02/24/2018   FRUCTOSAMINE 314 (H) 11/25/2017   FRUCTOSAMINE 281 12/02/2016    OTHER active problems: See review of systems   Allergies as of 07/22/2021   No Known Allergies      Medication List        Accurate as of July 22, 2021  8:45 AM. If you have any questions, ask your nurse or doctor.          Alpha Lipoic Acid 200 MG Caps Take 200 mg by mouth. 2 pills daily   aspirin 81 MG tablet Take 81 mg by mouth daily.   Bayer Microlet Lancets lancets Use to check blood sugar two times per day   CONTOUR NEXT EZ MONITOR w/Device Kit Use to check blood sugar two times per day   Contour Next Test test  strip Generic drug: glucose blood TEST TWICE DAILY   diclofenac 75 MG EC tablet Commonly known as: VOLTAREN TAKE 1 TABLET BY MOUTH TWICE DAILY WITH FOOD   Fish Oil 500 MG Caps Take by mouth 3 (three) times daily.   gabapentin 600 MG tablet Commonly known as: NEURONTIN TAKE 1 TABLET BY MOUTH THREE TIMES DAILY   glimepiride 1 MG tablet Commonly known as: AMARYL TAKE 2 TABLETS BY MOUTH AT BEDTIME   Jardiance 25 MG Tabs tablet Generic drug: empagliflozin TAKE 1 TABLET BY MOUTH EVERY DAY   losartan 100 MG tablet Commonly known as: COZAAR TAKE 1 TABLET BY MOUTH EVERY DAY   magnesium oxide 400 (240 Mg) MG tablet Commonly known as: MAG-OX Take 1 tablet by mouth 3 (three) times daily. Take 1 in the morning and 2 in the evening.   metFORMIN 500 MG 24 hr tablet Commonly known as: GLUCOPHAGE-XR TAKE 4 TABLETS BY MOUTH EVERY DAY WITH SUPPER   multivitamin with minerals Tabs tablet Take 1 tablet by mouth daily.   Ozempic (1 MG/DOSE) 4 MG/3ML Sopn Generic drug: Semaglutide (1 MG/DOSE) INJECT 1 MG INTO SKIN ONCE WEEKLY   pantoprazole 40 MG tablet Commonly known as: PROTONIX TAKE 1 TABLET(40 MG) BY MOUTH TWICE DAILY   pravastatin 80 MG tablet Commonly known as: PRAVACHOL TAKE 1 TABLET(80 MG) BY MOUTH DAILY PATIENT WILL USE OF 40 MG SUPPLY FIRST   sertraline 100 MG tablet Commonly known as: ZOLOFT Take 2 tablets (200 mg total) by mouth daily.   vitamin A 25000 UNIT capsule Take 25,000 Units by mouth daily.   vitamin B-12 500 MCG tablet Commonly known as: CYANOCOBALAMIN Take 500 mcg by mouth daily.   vitamin C 1000 MG tablet Take 1,000 mg by mouth daily.   VITAMIN D PO Take 1 tablet by mouth daily. D3 1000 IU   VITAMIN E PO Take 1 capsule by mouth daily. 400 IU daily        Allergies: No Known Allergies  Past Medical History:  Diagnosis Date   Depression    Diabetes mellitus without complication ([place])    Hyperlipidemia    Hypertension    Ischemic colitis  [facility])     Past Surgical History:  Procedure Laterality Date   COLONOSCOPY  06/01/2014   NASAL SEPTUM SURGERY      Family History  Problem Relation Age of Onset   Hypertension Mother    Stroke Father    Diabetes Maternal Grandfather    Colon cancer Neg Hx  Throat cancer Neg Hx    Prostate cancer Neg Hx    Pancreatic cancer Neg Hx    Heart disease Neg Hx    Kidney disease Neg Hx    Liver disease Neg Hx     Social History:  reports that he has never smoked. He has never used smokeless tobacco. He reports that he does not drink alcohol and does not use drugs.    Review of Systems      HYPERTENSION: Taking 100 mg losartan Also on Jardiance  He is getting his blood pressure checked at work   BP Readings from Last 3 Encounters:  07/22/21 126/88  07/03/21 124/84  04/17/21 130/84   Renal function as follows:  Lab Results  Component Value Date   CREATININE 1.30 07/18/2021   CREATININE 1.17 04/15/2021   CREATININE 1.30 12/10/2020        Hyperlipidemia: taking pravastatin  80 mg LDL is consistently below 100   Lab Results  Component Value Date   CHOL 145 04/15/2021   HDL 44.80 04/15/2021   LDLCALC 63 04/15/2021   LDLDIRECT 108.0 12/09/2018   TRIG 185.0 (H) 04/15/2021   CHOLHDL 3 04/15/2021     Lab Results  Component Value Date   ALT 15 04/15/2021     NEUROPATHY He has had symptoms of neuropathy in feet.  However he thinks the burning in his feet is worse now  He  takes gabapentin 600 mg 3 times a day without adequate control  Had a normal eye exam and was seen by ophthalmologist in 5/21  Foot exam done by PCP recently and reportedly has some decreased sensation   Physical Examination:  BP 126/88   Pulse 83   Ht 6' (1.829 m)   Wt 287 lb 12.8 oz (130.5 kg)   SpO2 91%   BMI 39.03 kg/m      ASSESSMENT/PLAN:   Diabetes type 2 with obesity:  See history of present illness for detailed discussion of current diabetes management, blood sugar  patterns and problems identified   A1c is slightly higher at 7.4  Currently taking Amaryl 1 mg before lunch, along with Ozempic/Jardiance/metformin   His blood sugars at home are recently fairly consistent with highest reading only 157 With his main meal at lunchtime at work his blood sugars recently are not going up significantly Also fasting readings are as low as 83 on the lab He may feel a little hypoglycemic after exercise in the mornings Weight has improved   Plan: Since he has nausea recently will not increase Ozempic Also he can leave off the Ozempic for 3 to 4 days after his due day on Monday and see if it makes any difference Restart exercise when he can Consistently prudent diet Try to check more readings 2 hours after meals and also some on waking up  HYPERTENSION: Fair control, may have relatively high readings in the office and for He will try to monitor regularly at home and call if consistently high   Renal function: Creatinine is borderline and may benefit from trying to reduce diclofenac which she takes chronically  NEUROPATHY: Not well controlled he can try lyrica 150 twice daily instead of gabapentin  Follow-up in 3 months  There are no Patient Instructions on file for this visit.       Elayne Snare 07/22/2021, 8:45 AM

## 2021-08-23 ENCOUNTER — Telehealth: Payer: BC Managed Care – PPO | Admitting: Physician Assistant

## 2021-08-23 DIAGNOSIS — U071 COVID-19: Secondary | ICD-10-CM | POA: Diagnosis not present

## 2021-08-23 MED ORDER — BENZONATATE 100 MG PO CAPS: 100.0000 mg | ORAL_CAPSULE | Freq: Three times a day (TID) | ORAL | 0 refills | Status: DC | PRN

## 2021-08-23 MED ORDER — ALBUTEROL SULFATE HFA 108 (90 BASE) MCG/ACT IN AERS: 2.0000 | INHALATION_SPRAY | Freq: Four times a day (QID) | RESPIRATORY_TRACT | 0 refills | Status: DC | PRN

## 2021-08-23 MED ORDER — FLUTICASONE PROPIONATE 50 MCG/ACT NA SUSP: 2.0000 | Freq: Every day | NASAL | 0 refills | Status: DC

## 2021-08-23 MED ORDER — MOLNUPIRAVIR EUA 200MG CAPSULE: 4.0000 | ORAL_CAPSULE | Freq: Two times a day (BID) | ORAL | 0 refills | Status: AC

## 2021-08-23 NOTE — Progress Notes (Signed)
Virtual Visit Consent   Jack Wallace, you are scheduled for a virtual visit with a [facility] provider today.     Just as with appointments in the office, your consent must be obtained to participate.  Your consent will be active for this visit and any virtual visit you may have with one of our providers in the next 365 days.     If you have a MyChart account, a copy of this consent can be sent to you electronically.  All virtual visits are billed to your insurance company just like a traditional visit in the office.    As this is a virtual visit, video technology does not allow for your provider to perform a traditional examination.  This may limit your provider's ability to fully assess your condition.  If your provider identifies any concerns that need to be evaluated in person or the need to arrange testing (such as labs, EKG, etc.), we will make arrangements to do so.     Although advances in technology are sophisticated, we cannot ensure that it will always work on either your end or our end.  If the connection with a video visit is poor, the visit may have to be switched to a telephone visit.  With either a video or telephone visit, we are not always able to ensure that we have a secure connection.     I need to obtain your verbal consent now.   Are you willing to proceed with your visit today?    Jack Wallace has provided verbal consent on 08/23/2021 for a virtual visit (video or telephone).   Mar Daring, PA-C   Date: 08/23/2021 9:29 AM   Virtual Visit via Video Note   I, Mar Daring, connected with  Jack Wallace  ([id number], 09-15-1961) on 08/23/21 at  9:00 AM EDT by a video-enabled telemedicine application and verified that I am speaking with the correct person using two identifiers.  Location: Patient: Virtual Visit Location Patient: Home Provider: Virtual Visit Location Provider: Home Office   I discussed the limitations of evaluation and management by  telemedicine and the availability of in person appointments. The patient expressed understanding and agreed to proceed.    History of Present Illness: Jack Wallace is a 60 y.o. who identifies as a male who was assigned male at birth, and is being seen today for Covid 8.  HPI: URI  This is a new problem. Episode onset: Symptoms started yesterday, tested positive this morning. The problem has been gradually worsening. There has been no fever. Associated symptoms include congestion, coughing (mild, dry), headaches (mild), nausea, rhinorrhea, sinus pain and a sore throat. Pertinent negatives include no diarrhea, ear pain, plugged ear sensation or vomiting. Associated symptoms comments: Hoarse voice. He has tried nothing for the symptoms.     Problems:  Patient Active Problem List   Diagnosis Date Noted   Pain of joint of left ankle and foot 04/01/2018   MDD (major depressive disorder) 11/13/2015   GERD (gastroesophageal reflux disease) 08/20/2015   Constipation 08/20/2015   Chronic nausea 03/29/2015   Dysphagia 03/29/2015   Preventative health care 08/23/2014   Internal hemorrhoids 05/14/2014   Diabetic neuropathy ([place]) 08/01/2013   Hypertension associated with diabetes ([place]) 07/31/2013   Hyperlipidemia 07/31/2013   Type 2 diabetes mellitus ([place]) 07/31/1992    Allergies: No Known Allergies Medications:  Current Outpatient Medications:    albuterol (VENTOLIN HFA) 108 (90 Base) MCG/ACT inhaler, Inhale 2 puffs  into the lungs every 6 (six) hours as needed for wheezing or shortness of breath., Disp: 8 g, Rfl: 0   benzonatate (TESSALON) 100 MG capsule, Take 1 capsule (100 mg total) by mouth 3 (three) times daily as needed., Disp: 30 capsule, Rfl: 0   fluticasone (FLONASE) 50 MCG/ACT nasal spray, Place 2 sprays into both nostrils daily., Disp: 16 g, Rfl: 0   molnupiravir EUA 200 mg CAPS, Take 4 capsules (800 mg total) by mouth 2 (two) times daily for 5 days., Disp: 40 capsule, Rfl: 0   Alpha  Lipoic Acid 200 MG CAPS, Take 200 mg by mouth. 2 pills daily, Disp: , Rfl:    Ascorbic Acid (VITAMIN C) 1000 MG tablet, Take 1,000 mg by mouth daily., Disp: , Rfl:    aspirin 81 MG tablet, Take 81 mg by mouth daily., Disp: , Rfl:    BAYER MICROLET LANCETS lancets, Use to check blood sugar two times per day, Disp: 100 each, Rfl: 2   Beta Carotene (VITAMIN A) 25000 UNIT capsule, Take 25,000 Units by mouth daily., Disp: , Rfl:    Blood Glucose Monitoring Suppl (CONTOUR NEXT EZ MONITOR) w/Device KIT, Use to check blood sugar two times per day, Disp: 1 kit, Rfl: 2   Cholecalciferol (VITAMIN D PO), Take 1 tablet by mouth daily. D3 1000 IU, Disp: , Rfl:    CONTOUR NEXT TEST test strip, TEST TWICE DAILY, Disp: 100 strip, Rfl: 2   diclofenac (VOLTAREN) 75 MG EC tablet, TAKE 1 TABLET BY MOUTH TWICE DAILY WITH FOOD, Disp: 60 tablet, Rfl: 0   glimepiride (AMARYL) 1 MG tablet, TAKE 2 TABLETS BY MOUTH AT BEDTIME, Disp: 180 tablet, Rfl: 3   JARDIANCE 25 MG TABS tablet, TAKE 1 TABLET BY MOUTH EVERY DAY, Disp: 90 tablet, Rfl: 0   losartan (COZAAR) 100 MG tablet, TAKE 1 TABLET BY MOUTH EVERY DAY, Disp: 90 tablet, Rfl: 1   Magnesium Oxide 400 (240 MG) MG TABS, Take 1 tablet by mouth 3 (three) times daily. Take 1 in the morning and 2 in the evening., Disp: 90 tablet, Rfl: 2   metFORMIN (GLUCOPHAGE-XR) 500 MG 24 hr tablet, TAKE 4 TABLETS BY MOUTH EVERY DAY WITH SUPPER, Disp: 360 tablet, Rfl: 1   Multiple Vitamin (MULTIVITAMIN WITH MINERALS) TABS tablet, Take 1 tablet by mouth daily., Disp: , Rfl:    Omega-3 Fatty Acids (FISH OIL) 500 MG CAPS, Take by mouth 3 (three) times daily., Disp: , Rfl:    OZEMPIC, 1 MG/DOSE, 4 MG/3ML SOPN, INJECT 1 MG INTO SKIN ONCE WEEKLY, Disp: 3 mL, Rfl: 3   pantoprazole (PROTONIX) 40 MG tablet, TAKE 1 TABLET(40 MG) BY MOUTH TWICE DAILY, Disp: 180 tablet, Rfl: 4   pravastatin (PRAVACHOL) 80 MG tablet, TAKE 1 TABLET(80 MG) BY MOUTH DAILY PATIENT WILL USE OF 40 MG SUPPLY FIRST, Disp: 90 tablet,  Rfl: 3   pregabalin (LYRICA) 150 MG capsule, Take 1 capsule (150 mg total) by mouth 2 (two) times daily., Disp: 60 capsule, Rfl: 2   sertraline (ZOLOFT) 100 MG tablet, Take 2 tablets (200 mg total) by mouth daily., Disp: 120 tablet, Rfl: 6   vitamin B-12 (CYANOCOBALAMIN) 500 MCG tablet, Take 500 mcg by mouth daily., Disp: , Rfl:    VITAMIN E PO, Take 1 capsule by mouth daily. 400 IU daily, Disp: , Rfl:   Observations/Objective: Patient is well-developed, well-nourished in no acute distress.  Resting comfortably at home.  Head is normocephalic, atraumatic.  No labored breathing.  Speech is clear  and coherent with logical content.  Patient is alert and oriented at baseline.    Assessment and Plan: 1. COVID-19 - molnupiravir EUA 200 mg CAPS; Take 4 capsules (800 mg total) by mouth 2 (two) times daily for 5 days.  Dispense: 40 capsule; Refill: 0 - benzonatate (TESSALON) 100 MG capsule; Take 1 capsule (100 mg total) by mouth 3 (three) times daily as needed.  Dispense: 30 capsule; Refill: 0 - fluticasone (FLONASE) 50 MCG/ACT nasal spray; Place 2 sprays into both nostrils daily.  Dispense: 16 g; Refill: 0 - albuterol (VENTOLIN HFA) 108 (90 Base) MCG/ACT inhaler; Inhale 2 puffs into the lungs every 6 (six) hours as needed for wheezing or shortness of breath.  Dispense: 8 g; Refill: 0 - MyChart COVID-19 home monitoring program; Future - Continue OTC symptomatic management of choice - Will send OTC vitamins and supplement information through AVS - Molnupiravir prescribed - Tessalon perles, flonase, and albuterol prescribed for symptomatic management - Patient enrolled in MyChart symptom monitoring - Push fluids - Rest as needed - Discussed return precautions and when to seek in-person evaluation, sent via AVS as well  Follow Up Instructions: I discussed the assessment and treatment plan with the patient. The patient was provided an opportunity to ask questions and all were answered. The patient  agreed with the plan and demonstrated an understanding of the instructions.  A copy of instructions were sent to the patient via MyChart.  The patient was advised to call back or seek an in-person evaluation if the symptoms worsen or if the condition fails to improve as anticipated.  Time:  I spent 16 minutes with the patient via telehealth technology discussing the above problems/concerns.    Mar Daring, PA-C

## 2021-08-23 NOTE — Patient Instructions (Signed)
Hello Jack Wallace,  You are being placed in the home monitoring program for COVID-19 (commonly known as Coronavirus).  This is because you are suspected to have the virus or are known to have the virus.  If you are unsure which group you fall into call your clinic.    As part of this program, you'll answer a daily questionnaire in the MyChart mobile app. You'll receive a notification through the MyChart app when the questionnaire is available. When you log in to MyChart, you'll see the tasks in your To Do activity.       Clinicians will see any answers that are concerning and take appropriate steps.  If at any point you are having a medical emergency, call 911.  If otherwise concerned call your clinic instead of coming into the clinic or hospital.  To keep from spreading the disease you should: Stay home and limit contact with other people as much as possible.  Wash your hands frequently. Cover your coughs and sneezes with a tissue, and throw used tissues in the trash.   Clean and disinfect frequently touched surfaces and objects.    Take care of yourself by: Staying home Resting Drinking fluids Take fever-reducing medications (Tylenol/Acetaminophen and Ibuprofen)  For more information on the disease go to the Centers for Disease Control and Prevention website     COVID-19: Quarantine and Isolation Quarantine If you were exposed Quarantine and stay away from others when you have been in close contact with someone who has COVID-19. Isolate If you are sick or test positive Isolate when you are sick or when you have COVID-19, even if you don't have symptoms. When to stay home Calculating quarantine The date of your exposure is considered day 0. Day 1 is the first full day after your last contact with a person who has had COVID-19. Stay home and away from other people for at least 5 days. Learn why CDC updated guidance for the general public. IF YOU were exposed to COVID-19 and are NOT  up  to dateIF YOU were exposed to COVID-19 and are NOT on COVID-19 vaccinations Quarantine for at least 5 days Stay home Stay home and quarantine for at least 5 full days. Wear a well-fitting mask if you must be around others in your home. Do not travel. Get tested Even if you don't develop symptoms, get tested at least 5 days after you last had close contact with someone with COVID-19. After quarantine Watch for symptoms Watch for symptoms until 10 days after you last had close contact with someone with COVID-19. Avoid travel It is best to avoid travel until a full 10 days after you last had close contact with someone with COVID-19. If you develop symptoms Isolate immediately and get tested. Continue to stay home until you know the results. Wear a well-fitting mask around others. Take precautions until day 10 Wear a well-fitting mask Wear a well-fitting mask for 10 full days any time you are around others inside your home or in public. Do not go to places where you are unable to wear a well-fitting mask. If you must travel during days 6-10, take precautions. Avoid being around people who are more likely to get very sick from COVID-19. IF YOU were exposed to COVID-19 and are  up to dateIF YOU were exposed to COVID-19 and are on COVID-19 vaccinations No quarantine You do not need to stay home unless you develop symptoms. Get tested Even if you don't develop symptoms, get tested at  least 5 days after you last had close contact with someone with COVID-19. Watch for symptoms Watch for symptoms until 10 days after you last had close contact with someone with COVID-19. If you develop symptoms Isolate immediately and get tested. Continue to stay home until you know the results. Wear a well-fitting mask around others. Take precautions until day 10 Wear a well-fitting mask Wear a well-fitting mask for 10 full days any time you are around others inside your home or in public. Do not go to places  where you are unable to wear a well-fitting mask. Take precautions if traveling Avoid being around people who are more likely to get very sick from COVID-19. IF YOU were exposed to COVID-19 and had confirmed COVID-19 within the past 90 days (you tested positive using a viral test) No quarantine You do not need to stay home unless you develop symptoms. Watch for symptoms Watch for symptoms until 10 days after you last had close contact with someone with COVID-19. If you develop symptoms Isolate immediately and get tested. Continue to stay home until you know the results. Wear a well-fitting mask around others. Take precautions until day 10 Wear a well-fitting mask Wear a well-fitting mask for 10 full days any time you are around others inside your home or in public. Do not go to places where you are unable to wear a well-fitting mask. Take precautions if traveling Avoid being around people who are more likely to get very sick from COVID-19. Calculating isolation Day 0 is your first day of symptoms or a positive viral test. Day 1 is the first full day after your symptoms developed or your test specimen was collected. If you have COVID-19 or have symptoms, isolate for at least 5 days. IF YOU tested positive for COVID-19 or have symptoms, regardless of vaccination status Stay home for at least 5 days Stay home for 5 days and isolate from others in your home. Wear a well-fitting mask if you must be around others in your home. Do not travel. Ending isolation if you had symptoms End isolation after 5 full days if you are fever-free for 24 hours (without the use of fever-reducing medication) and your symptoms are improving. Ending isolation if you did NOT have symptoms End isolation after at least 5 full days after your positive test. If you got very sick from COVID-19 or have a weakened immune system You should isolate for at least 10 days. Consult your doctor before ending isolation. Take  precautions until day 10 Wear a well-fitting mask Wear a well-fitting mask for 10 full days any time you are around others inside your home or in public. Do not go to places where you are unable to wear a well-fitting mask. Do not travel Do not travel until a full 10 days after your symptoms started or the date your positive test was taken if you had no symptoms. Avoid being around people who are more likely to get very sick from COVID-19. Definitions Exposure Contact with someone infected with SARS-CoV-2, the virus that causes COVID-19, in a way that increases the likelihood of getting infected with the virus. Close contact A close contact is someone who was less than 6 feet away from an infected person (laboratory-confirmed or a clinical diagnosis) for a cumulative total of 15 minutes or more over a 24-hour period. For example, three individual 5-minute exposures for a total of 15 minutes. People who are exposed to someone with COVID-19 after they completed at least  5 days of isolation are not considered close contacts. Julio Sicks is a strategy used to prevent transmission of COVID-19 by keeping people who have been in close contact with someone with COVID-19 apart from others. Who does not need to quarantine? If you had close contact with someone with COVID-19 and you are in one of the following groups, you do not need to quarantine. You are up to date with your COVID-19 vaccines. You had confirmed COVID-19 within the last 90 days (meaning you tested positive using a viral test). If you are up to date with COVID-19 vaccines, you should wear a well-fitting mask around others for 10 days from the date of your last close contact with someone with COVID-19 (the date of last close contact is considered day 0). Get tested at least 5 days after you last had close contact with someone with COVID-19. If you test positive or develop COVID-19 symptoms, isolate from other people and follow  recommendations in the Isolation section below. If you tested positive for COVID-19 with a viral test within the previous 90 days and subsequently recovered and remain without COVID-19 symptoms, you do not need to quarantine or get tested after close contact. You should wear a well-fitting mask around others for 10 days from the date of your last close contact with someone with COVID-19 (the date of last close contact is considered day 0). If you have COVID-19 symptoms, get tested and isolate from other people and follow recommendations in the Isolation section below. Who should quarantine? If you come into close contact with someone with COVID-19, you should quarantine if you are not up to date on COVID-19 vaccines. This includes people who are not vaccinated. What to do for quarantine Stay home and away from other people for at least 5 days (day 0 through day 5) after your last contact with a person who has COVID-19. The date of your exposure is considered day 0. Wear a well-fitting mask when around others at home, if possible. For 10 days after your last close contact with someone with COVID-19, watch for fever (100.34F or greater), cough, shortness of breath, or other COVID-19 symptoms. If you develop symptoms, get tested immediately and isolate until you receive your test results. If you test positive, follow isolation recommendations. If you do not develop symptoms, get tested at least 5 days after you last had close contact with someone with COVID-19. If you test negative, you can leave your home, but continue to wear a well-fitting mask when around others at home and in public until 10 days after your last close contact with someone with COVID-19. If you test positive, you should isolate for at least 5 days from the date of your positive test (if you do not have symptoms). If you do develop COVID-19 symptoms, isolate for at least 5 days from the date your symptoms began (the date the symptoms  started is day 0). Follow recommendations in the isolation section below. If you are unable to get a test 5 days after last close contact with someone with COVID-19, you can leave your home after day 5 if you have been without COVID-19 symptoms throughout the 5-day period. Wear a well-fitting mask for 10 days after your date of last close contact when around others at home and in public. Avoid people who are have weakened immune systems or are more likely to get very sick from COVID-19, and nursing homes and other high-risk settings, until after at least 10 days. If  possible, stay away from people you live with, especially people who are at higher risk for getting very sick from COVID-19, as well as others outside your home throughout the full 10 days after your last close contact with someone with COVID-19. If you are unable to quarantine, you should wear a well-fitting mask for 10 days when around others at home and in public. If you are unable to wear a mask when around others, you should continue to quarantine for 10 days. Avoid people who have weakened immune systems or are more likely to get very sick from COVID-19, and nursing homes and other high-risk settings, until after at least 10 days. See additional information about travel. Do not go to places where you are unable to wear a mask, such as restaurants and some gyms, and avoid eating around others at home and at work until after 10 days after your last close contact with someone with COVID-19. After quarantine Watch for symptoms until 10 days after your last close contact with someone with COVID-19. If you have symptoms, isolate immediately and get tested. Quarantine in high-risk congregate settings In certain congregate settings that have high risk of secondary transmission (such as Systems analyst and detention facilities, homeless shelters, or cruise ships), CDC recommends a 10-day quarantine for residents, regardless of vaccination and  booster status. During periods of critical staffing shortages, facilities may consider shortening the quarantine period for staff to ensure continuity of operations. Decisions to shorten quarantine in these settings should be made in consultation with state, local, tribal, or territorial health departments and should take into consideration the context and characteristics of the facility. CDC's setting-specific guidance provides additional recommendations for these settings. Isolation Isolation is used to separate people with confirmed or suspected COVID-19 from those without COVID-19. People who are in isolation should stay home until it's safe for them to be around others. At home, anyone sick or infected should separate from others, or wear a well-fitting mask when they need to be around others. People in isolation should stay in a specific "sick room" or area and use a separate bathroom if available. Everyone who has presumed or confirmed COVID-19 should stay home and isolate from other people for at least 5 full days (day 0 is the first day of symptoms or the date of the day of the positive viral test for asymptomatic persons). They should wear a mask when around others at home and in public for an additional 5 days. People who are confirmed to have COVID-19 or are showing symptoms of COVID-19 need to isolate regardless of their vaccination status. This includes: People who have a positive viral test for COVID-19, regardless of whether or not they have symptoms. People with symptoms of COVID-19, including people who are awaiting test results or have not been tested. People with symptoms should isolate even if they do not know if they have been in close contact with someone with COVID-19. What to do for isolation Monitor your symptoms. If you have an emergency warning sign (including trouble breathing), seek emergency medical care immediately. Stay in a separate room from other household members, if  possible. Use a separate bathroom, if possible. Take steps to improve ventilation at home, if possible. Avoid contact with other members of the household and pets. Don't share personal household items, like cups, towels, and utensils. Wear a well-fitting mask when you need to be around other people. Learn more about what to do if you are sick and how to notify your  contacts. Ending isolation for people who had COVID-19 and had symptoms If you had COVID-19 and had symptoms, isolate for at least 5 days. To calculate your 5-day isolation period, day 0 is your first day of symptoms. Day 1 is the first full day after your symptoms developed. You can leave isolation after 5 full days. You can end isolation after 5 full days if you are fever-free for 24 hours without the use of fever-reducing medication and your other symptoms have improved (Loss of taste and smell may persist for weeks or months after recovery and need not delay the end of isolation). You should continue to wear a well-fitting mask around others at home and in public for 5 additional days (day 6 through day 10) after the end of your 5-day isolation period. If you are unable to wear a mask when around others, you should continue to isolate for a full 10 days. Avoid people who have weakened immune systems or are more likely to get very sick from COVID-19, and nursing homes and other high-risk settings, until after at least 10 days. If you continue to have fever or your other symptoms have not improved after 5 days of isolation, you should wait to end your isolation until you are fever-free for 24 hours without the use of fever-reducing medication and your other symptoms have improved. Continue to wear a well-fitting mask through day 10. Contact your healthcare provider if you have questions. See additional information about travel. Do not go to places where you are unable to wear a mask, such as restaurants and some gyms, and avoid eating  around others at home and at work until a full 10 days after your first day of symptoms. If an individual has access to a test and wants to test, the best approach is to use an antigen test1 towards the end of the 5-day isolation period. Collect the test sample only if you are fever-free for 24 hours without the use of fever-reducing medication and your other symptoms have improved (loss of taste and smell may persist for weeks or months after recovery and need not delay the end of isolation). If your test result is positive, you should continue to isolate until day 10. If your test result is negative, you can end isolation, but continue to wear a well-fitting mask around others at home and in public until day 10. Follow additional recommendations for masking and avoiding travel as described above. 1As noted in the labeling for authorized over-the counter antigen tests: Negative results should be treated as presumptive. Negative results do not rule out SARS-CoV-2 infection and should not be used as the sole basis for treatment or patient management decisions, including infection control decisions. To improve results, antigen tests should be used twice over a three-day period with at least 24 hours and no more than 48 hours between tests. Note that these recommendations on ending isolation do not apply to people who are moderately ill or very sick from COVID-19 or have weakened immune systems. See section below for recommendations for when to end isolation for these groups. Ending isolation for people who tested positive for COVID-19 but had no symptoms If you test positive for COVID-19 and never develop symptoms, isolate for at least 5 days. Day 0 is the day of your positive viral test (based on the date you were tested) and day 1 is the first full day after the specimen was collected for your positive test. You can leave isolation after 5  full days. If you continue to have no symptoms, you can end isolation  after at least 5 days. You should continue to wear a well-fitting mask around others at home and in public until day 10 (day 6 through day 10). If you are unable to wear a mask when around others, you should continue to isolate for 10 days. Avoid people who have weakened immune systems or are more likely to get very sick from COVID-19, and nursing homes and other high-risk settings, until after at least 10 days. If you develop symptoms after testing positive, your 5-day isolation period should start over. Day 0 is your first day of symptoms. Follow the recommendations above for ending isolation for people who had COVID-19 and had symptoms. See additional information about travel. Do not go to places where you are unable to wear a mask, such as restaurants and some gyms, and avoid eating around others at home and at work until 10 days after the day of your positive test. If an individual has access to a test and wants to test, the best approach is to use an antigen test1 towards the end of the 5-day isolation period. If your test result is positive, you should continue to isolate until day 10. If your test result is positive, you can also choose to test daily and if your test result is negative, you can end isolation, but continue to wear a well-fitting mask around others at home and in public until day 10. Follow additional recommendations for masking and avoiding travel as described above. 1As noted in the labeling for authorized over-the counter antigen tests: Negative results should be treated as presumptive. Negative results do not rule out SARS-CoV-2 infection and should not be used as the sole basis for treatment or patient management decisions, including infection control decisions. To improve results, antigen tests should be used twice over a three-day period with at least 24 hours and no more than 48 hours between tests. Ending isolation for people who were moderately or very sick from COVID-19 or  have a weakened immune system People who are moderately ill from COVID-19 (experiencing symptoms that affect the lungs like shortness of breath or difficulty breathing) should isolate for 10 days and follow all other isolation precautions. To calculate your 10-day isolation period, day 0 is your first day of symptoms. Day 1 is the first full day after your symptoms developed. If you are unsure if your symptoms are moderate, talk to a healthcare provider for further guidance. People who are very sick from COVID-19 (this means people who were hospitalized or required intensive care or ventilation support) and people who have weakened immune systems might need to isolate at home longer. They may also require testing with a viral test to determine when they can be around others. CDC recommends an isolation period of at least 10 and up to 20 days for people who were very sick from COVID-19 and for people with weakened immune systems. Consult with your healthcare provider about when you can resume being around other people. If you are unsure if your symptoms are severe or if you have a weakened immune system, talk to a healthcare provider for further guidance. People who have a weakened immune system should talk to their healthcare provider about the potential for reduced immune responses to COVID-19 vaccines and the need to continue to follow current prevention measures (including wearing a well-fitting mask and avoiding crowds and poorly ventilated indoor spaces) to protect themselves against COVID-19  until advised otherwise by their healthcare provider. Close contacts of immunocompromised people--including household members--should also be encouraged to receive all recommended COVID-19 vaccine doses to help protect these people. Isolation in high-risk congregate settings In certain high-risk congregate settings that have high risk of secondary transmission and where it is not feasible to cohort people (such as  Systems analyst and detention facilities, homeless shelters, and cruise ships), CDC recommends a 10-day isolation period for residents. During periods of critical staffing shortages, facilities may consider shortening the isolation period for staff to ensure continuity of operations. Decisions to shorten isolation in these settings should be made in consultation with state, local, tribal, or territorial health departments and should take into consideration the context and characteristics of the facility. CDC's setting-specific guidance provides additional recommendations for these settings. This CDC guidance is meant to supplement--not replace--any federal, state, local, territorial, or tribal health and safety laws, rules, and regulations. Recommendations for specific settings These recommendations do not apply to healthcare professionals. For guidance specific to these settings, see Healthcare professionals: Interim Guidance for Optician, dispensing with SARS-CoV-2 Infection or Exposure to SARS-CoV-2 Patients, residents, and visitors to healthcare settings: Interim Infection Prevention and Control Recommendations for Healthcare Personnel During the [place] 2019 (COVID-19) Pandemic Additional setting-specific guidance and recommendations are available. These recommendations on quarantine and isolation do apply to [place] settings. Additional guidance is available here: Overview of COVID-19 Quarantine for K-12 Schools Travelers: Travel information and recommendations Congregate facilities and other settings: [employer] for community, work, and school settings Ongoing COVID-19 exposure FAQs I live with someone with COVID-19, but I cannot be separated from them. How do we manage quarantine in this situation? It is very important for people with COVID-19 to remain apart from other people, if possible, even if they are living together. If separation of the person with COVID-19 from  others that they live with is not possible, the other people that they live with will have ongoing exposure, meaning they will be repeatedly exposed until that person is no longer able to spread the virus to other people. In this situation, there are precautions you can take to limit the spread of COVID-19: The person with COVID-19 and everyone they live with should wear a well-fitting mask inside the home. If possible, one person should care for the person with COVID-19 to limit the number of people who are in close contact with the infected person. Take steps to protect yourself and others to reduce transmission in the home: Quarantine if you are not up to date with your COVID-19 vaccines. Isolate if you are sick or tested positive for COVID-19, even if you don't have symptoms. Learn more about the public health recommendations for testing, mask use and quarantine of close contacts, like yourself, who have ongoing exposure. These recommendations differ depending on your vaccination status. What should I do if I have ongoing exposure to COVID-19 from someone I live with? Recommendations for this situation depend on your vaccination status: If you are not up to date on COVID-19 vaccines and have ongoing exposure to COVID-19, you should: Begin quarantine immediately and continue to quarantine throughout the isolation period of the person with COVID-19. Continue to quarantine for an additional 5 days starting the day after the end of isolation for the person with COVID-19. Get tested at least 5 days after the end of isolation of the infected person that lives with them. If you test negative, you can leave the home but should continue to wear  a well-fitting mask when around others at home and in public until 10 days after the end of isolation for the person with COVID-19. Isolate immediately if you develop symptoms of COVID-19 or test positive. If you are up to date with COVID-19 vaccines and have  ongoing exposure to COVID-19, you should: Get tested at least 5 days after your first exposure. A person with COVID-19 is considered infectious starting 2 days before they develop symptoms, or 2 days before the date of their positive test if they do not have symptoms. Get tested again at least 5 days after the end of isolation for the person with COVID-19. Wear a well-fitting mask when you are around the person with COVID-19, and do this throughout their isolation period. Wear a well-fitting mask around others for 10 days after the infected person's isolation period ends. Isolate immediately if you develop symptoms of COVID-19 or test positive. What should I do if multiple people I live with test positive for COVID-19 at different times? Recommendations for this situation depend on your vaccination status: If you are not up to date with your COVID-19 vaccines, you should: Quarantine throughout the isolation period of any infected person that you live with. Continue to quarantine until 5 days after the end of isolation date for the most recently infected person that lives with you. For example, if the last day of isolation of the person most recently infected with COVID-19 was June 30, the new 5-day quarantine period starts on July 1. Get tested at least 5 days after the end of isolation for the most recently infected person that lives with you. Wear a well-fitting mask when you are around any person with COVID-19 while that person is in isolation. Wear a well-fitting mask when you are around other people until 10 days after your last close contact. Isolate immediately if you develop symptoms of COVID-19 or test positive. If you are up to date with your COVID-19 vaccines, you should: Get tested at least 5 days after your first exposure. A person with COVID-19 is considered infectious starting 2 days before they developed symptoms, or 2 days before the date of their positive test if they do not have  symptoms. Get tested again at least 5 days after the end of isolation for the most recently infected person that lives with you. Wear a well-fitting mask when you are around any person with COVID-19 while that person is in isolation. Wear a well-fitting mask around others for 10 days after the end of isolation for the most recently infected person that lives with you. For example, if the last day of isolation for the person most recently infected with COVID-19 was June 30, the new 10-day period to wear a well-fitting mask indoors in public starts on July 1. Isolate immediately if you develop symptoms of COVID-19 or test positive. I had COVID-19 and completed isolation. Do I have to quarantine or get tested if someone I live with gets COVID-19 shortly after I completed isolation? No. If you recently completed isolation and someone that lives with you tests positive for the virus that causes COVID-19 shortly after the end of your isolation period, you do not have to quarantine or get tested as long as you do not develop new symptoms. Once all of the people that live together have completed isolation or quarantine, refer to the guidance below for new exposures to COVID-19. If you had COVID-19 in the previous 90 days and then came into close contact with  someone with COVID-19, you do not have to quarantine or get tested if you do not have symptoms. But you should: Wear a well-fitting mask indoors in public for 10 days after your last close contact. Monitor for COVID-19 symptoms for 10 days from the date of your last close contact. Isolate immediately and get tested if symptoms develop. If more than 90 days have passed since your recovery from infection, follow CDC's recommendations for close contacts. These recommendations will differ depending on your vaccination status. 03/19/2021 Content source: [facility] ([facility]), Division of Viral Diseases This  information is not intended to replace advice given to you by your health care provider. Make sure you discuss any questions you have with your health care provider. Document Revised: 04/24/2021 Document Reviewed: 04/24/2021 Elsevier Patient Education  2022 [place] are being prescribed MOLNUPIRAVIR for COVID-19 infection.   Please call the pharmacy or go through the drive through vs going inside if you are picking up the mediation yourself to prevent further spread. If prescribed to a [facility] affiliated pharmacy, a pharmacist will bring the medication out to your car.   ADMINISTRATION INSTRUCTIONS: Take with or without food. Swallow the tablets whole. Don't chew, crush, or break the medications because it might not work as well  For each dose of the medication, you should be taking FOUR tablets at one time, TWICE a day   Finish your full five-day course of Molnupiravir even if you feel better before you're done. Stopping this medication too early can make it less effective to prevent severe illness related to [place].    Molnupiravir is prescribed for YOU ONLY. Don't share it with others, even if they have similar symptoms as you. This medication might not be right for everyone.   Make sure to take steps to protect yourself and others while you're taking this medication in order to get well soon and to prevent others from getting sick with COVID-19.   **If you are of childbearing potential (any gender) - it is advised to not get pregnant while taking this medication and recommended that condoms are used for male partners the next 3 months after taking the medication out of extreme caution    COMMON SIDE EFFECTS: Diarrhea Nausea  Dizziness    If your COVID-19 symptoms get worse, get medical help right away. Call 911 if you experience symptoms such as worsening cough, trouble breathing, chest pain that doesn't go away, confusion, a hard time staying awake, and pale or  blue-colored skin. This medication won't prevent all COVID-19 cases from getting worse.   Can take to lessen severity: Vit C '500mg'$  twice daily Quercertin 250-'500mg'$  twice daily Zinc 75-'100mg'$  daily Melatonin 3-6 mg at bedtime Vit D3 1000-2000 IU daily Aspirin 81 mg daily with food Optional: Famotidine '20mg'$  daily Also can add tylenol/ibuprofen as needed for fevers and body aches May add Mucinex or Mucinex DM as needed for cough/congestion  10 Things You Can Do to Manage Your COVID-19 Symptoms at Home If you have possible or confirmed COVID-19 Stay home except to get medical care. Monitor your symptoms carefully. If your symptoms get worse, call your healthcare provider immediately. Get rest and stay hydrated. If you have a medical appointment, call the healthcare provider ahead of time and tell them that you have or may have COVID-19. For medical emergencies, call 911 and notify the dispatch personnel that you have or may have COVID-19. Cover your cough and sneezes with  a tissue or use the inside of your elbow. Wash your hands often with soap and water for at least 20 seconds or clean your hands with an alcohol-based hand sanitizer that contains at least 60% alcohol. As much as possible, stay in a specific room and away from other people in your home. Also, you should use a separate bathroom, if available. If you need to be around other people in or outside of the home, wear a mask. Avoid sharing personal items with other people in your household, like dishes, towels, and bedding. Clean all surfaces that are touched often, like counters, tabletops, and doorknobs. Use household cleaning sprays or wipes according to the label instructions. [email or web address] 07/05/2020 This information is not intended to replace advice given to you by your health care provider. Make sure you discuss any questions you have with your health care provider. Document Revised: 04/24/2021 Document Reviewed:  04/24/2021 Elsevier Patient Education  [place].

## 2021-09-02 ENCOUNTER — Other Ambulatory Visit: Payer: Self-pay | Admitting: Endocrinology

## 2021-09-02 ENCOUNTER — Other Ambulatory Visit: Payer: Self-pay | Admitting: Internal Medicine

## 2021-09-02 DIAGNOSIS — F321 Major depressive disorder, single episode, moderate: Secondary | ICD-10-CM

## 2021-09-03 NOTE — Telephone Encounter (Signed)
Follows with endocrinology for this. Please forward to their office. Thanks.

## 2021-09-04 ENCOUNTER — Other Ambulatory Visit: Payer: Self-pay | Admitting: Internal Medicine

## 2021-09-04 DIAGNOSIS — F321 Major depressive disorder, single episode, moderate: Secondary | ICD-10-CM

## 2021-09-04 MED ORDER — SERTRALINE HCL 100 MG PO TABS: 200.0000 mg | ORAL_TABLET | Freq: Every day | ORAL | 6 refills | Status: DC

## 2021-09-04 NOTE — Telephone Encounter (Signed)
Sorry, that was in reference to the metformin refill. We fill the zoloft, which I just sent in.

## 2021-09-09 ENCOUNTER — Ambulatory Visit: Payer: BC Managed Care – PPO | Admitting: Internal Medicine

## 2021-09-09 VITALS — BP 152/88 | HR 86 | Temp 99.1°F | Wt 293.0 lb

## 2021-09-09 DIAGNOSIS — R5383 Other fatigue: Secondary | ICD-10-CM | POA: Diagnosis not present

## 2021-09-09 DIAGNOSIS — D539 Nutritional anemia, unspecified: Secondary | ICD-10-CM | POA: Diagnosis not present

## 2021-09-09 DIAGNOSIS — U071 COVID-19: Secondary | ICD-10-CM | POA: Insufficient documentation

## 2021-09-09 NOTE — Assessment & Plan Note (Signed)
Patient diagnosed with COVID on 9/3 and was prescribed Molnupiravir 500mg  BID x5 days. The patient notes that he was given more tablets and continued to take this medicine for 13 days. He states that in the beginning of his infection, he had a sore throat, cough, fatigue, and a lot of nasal congestion. His cough and congestion have resolved, however, his sore throat and fatigue have lingered. He states that he occasionally has shortness of breath still too, since his diagnosis, but denies any history of asthma or COPD. Checked ambulatory pulse ox while the patient was in the clinic and he maintained his O2 sat >92 while ambulating. Advised the patient to use his home pulse oximeter to check his oxygen levels when he begins to feel short of breath and to call us, or go to an emergency department if it is <90.   Plan: - Continue supportive care - Ambulatory pulse ox checked today - Evaluating other causes of fatigue (see fatigue assessment)

## 2021-09-09 NOTE — Assessment & Plan Note (Signed)
Patient states that ever since his COVID infection on 9/3, he has been fatigued and this has not improved although his other COVID symptoms have improved. The fatigue has been quite bothersome to the patient and has interfered with his work. He states that he works the 3rd shift and sleeps throughout the day, but he often has trouble doing so. The patient never dealt with fatigue like this before COVID, and thus, has never had it worked up. He denies any other symptoms at this time. Will start workup with lab work and discussed with the patient that if all is negative, he may benefit from a sleep study in the future to rule out OSA. His fatigue could also be secondary to his COVID, however, will rule out other causes first.   Plan: - CBC, BMP, B12, TSH ordered - Consider sleep study in the future.

## 2021-09-09 NOTE — Progress Notes (Signed)
   CC: sore throat post covid  HPI:  Mr.Axel A Withers is a 60 y.o. person with HTN, HLD, and diabetes who presents following a COVID infection with a lingering sore throat. Please see problem-based list for further details, assessments, and plans.   Past Medical History:  Diagnosis Date   Depression    Diabetes mellitus without complication ([place])    Hyperlipidemia    Hypertension    Ischemic colitis ([place])    Review of Systems:  Review of Systems  Constitutional:  Positive for malaise/fatigue. Negative for chills and fever.  HENT:  Positive for sore throat. Negative for congestion.   Eyes: Negative.   Respiratory:  Positive for shortness of breath. Negative for cough.   Cardiovascular:  Negative for chest pain and palpitations.  Gastrointestinal:  Negative for diarrhea, heartburn, nausea and vomiting.  Musculoskeletal: Negative.   Neurological:  Negative for dizziness and headaches.    Physical Exam:  Vitals:   09/09/21 1447  BP: (!) 152/88  Pulse: 86  Temp: 99.1 F (37.3 C)  TempSrc: Oral  SpO2: 95%  Weight: 293 lb (132.9 kg)   General: No acute distress. Head: Normocephalic. Atraumatic. CV: RRR. No murmurs, rubs, or gallops. No LE edema Pulmonary: Lungs CTAB. Normal effort. No wheezing or rales. Abdominal: Soft, nontender, nondistended. Normal bowel sounds. Extremities: Palpable radial and DP pulses. Normal ROM. Skin: Warm and dry. No obvious rash or lesions. Neuro: A&Ox3. Moves all extremities. Normal sensation. No focal deficit. Psych: Normal mood and affect   Assessment & Plan:   See Encounters Tab for problem based charting.  Patient seen with Dr. Evette Doffing

## 2021-09-09 NOTE — Patient Instructions (Signed)
Thank you, [name] for allowing [place] to provide your care today. Today we discussed: COVID: I am glad you are starting to recover from your COVID injection, but sorry to hear the sore throat is lingering. Continue to keep up with supportive care. Fatigue: Although this could be a lingering symptom of COVID, we are going to do some labs today to rule out other causes of fatigue. If all is negative, we can consider a sleep study in the future  I have ordered the following labs for you:   Lab Orders         CBC no Diff         TSH         Vitamin B12         BMP8+Anion Gap       Referrals ordered today:   Referral Orders  No referral(s) requested today     I have ordered the following medication/changed the following medications:   Stop the following medications: There are no discontinued medications.   Start the following medications: No orders of the defined types were placed in this encounter.    Follow up: 4 months    Remember: to check your oxygen level at home when/if you feel short of breath  Should you have any questions or concerns please call the internal medicine clinic at [phone].     Buddy Duty, D.O. [place]

## 2021-09-10 LAB — BMP8+ANION GAP
Anion Gap: 15 mmol/L (ref 10.0–18.0)
BUN/Creatinine Ratio: 15 (ref 10–24)
BUN: 17 mg/dL (ref 8–27)
CO2: 21 mmol/L (ref 20–29)
Calcium: 9.5 mg/dL (ref 8.6–10.2)
Chloride: 104 mmol/L (ref 96–106)
Creatinine, Ser: 1.1 mg/dL (ref 0.76–1.27)
Glucose: 123 mg/dL — ABNORMAL HIGH (ref 65–99)
Potassium: 4.5 mmol/L (ref 3.5–5.2)
Sodium: 140 mmol/L (ref 134–144)
eGFR: 77 mL/min/{1.73_m2} (ref >59)

## 2021-09-10 LAB — CBC
Hematocrit: 40.3 % (ref 37.5–51.0)
Hemoglobin: 12.8 g/dL — ABNORMAL LOW (ref 13.0–17.7)
MCH: 26 pg — ABNORMAL LOW (ref 26.6–33.0)
MCHC: 31.8 g/dL (ref 31.5–35.7)
MCV: 82 fL (ref 79–97)
Platelets: 316 10*3/uL (ref 150–450)
RBC: 4.93 x10E6/uL (ref 4.14–5.80)
RDW: 14.6 % (ref 11.6–15.4)
WBC: 6.7 10*3/uL (ref 3.4–10.8)

## 2021-09-10 LAB — VITAMIN B12: Vitamin B-12: >2000 pg/mL — ABNORMAL HIGH (ref 232–1245)

## 2021-09-10 LAB — TSH: TSH: 4.82 u[IU]/mL — ABNORMAL HIGH (ref 0.450–4.500)

## 2021-09-10 NOTE — Progress Notes (Signed)
Internal Medicine Clinic Attending ° °I saw and evaluated the patient.  I personally confirmed the key portions of the history and exam documented by Dr. Atway and I reviewed pertinent patient test results.  The assessment, diagnosis, and plan were formulated together and I agree with the documentation in the resident’s note.  °

## 2021-09-30 ENCOUNTER — Other Ambulatory Visit: Payer: Self-pay | Admitting: Internal Medicine

## 2021-09-30 ENCOUNTER — Other Ambulatory Visit: Payer: Self-pay | Admitting: Endocrinology

## 2021-09-30 DIAGNOSIS — K2100 Gastro-esophageal reflux disease with esophagitis, without bleeding: Secondary | ICD-10-CM

## 2021-10-16 ENCOUNTER — Other Ambulatory Visit (INDEPENDENT_AMBULATORY_CARE_PROVIDER_SITE_OTHER): Payer: BC Managed Care – PPO

## 2021-10-16 ENCOUNTER — Other Ambulatory Visit: Payer: Self-pay

## 2021-10-16 DIAGNOSIS — E7800 Pure hypercholesterolemia, unspecified: Secondary | ICD-10-CM

## 2021-10-16 DIAGNOSIS — E1165 Type 2 diabetes mellitus with hyperglycemia: Secondary | ICD-10-CM

## 2021-10-16 LAB — COMPREHENSIVE METABOLIC PANEL
ALT: 13 U/L (ref 0–53)
AST: 18 U/L (ref 0–37)
Albumin: 4.7 g/dL (ref 3.5–5.2)
Alkaline Phosphatase: 55 U/L (ref 39–117)
BUN: 20 mg/dL (ref 6–23)
CO2: 28 mEq/L (ref 19–32)
Calcium: 9.8 mg/dL (ref 8.4–10.5)
Chloride: 103 mEq/L (ref 96–112)
Creatinine, Ser: 1.12 mg/dL (ref 0.40–1.50)
GFR: 71.11 mL/min (ref >60.00)
Glucose, Bld: 98 mg/dL (ref 70–99)
Potassium: 4.2 mEq/L (ref 3.5–5.1)
Sodium: 140 mEq/L (ref 135–145)
Total Bilirubin: 0.7 mg/dL (ref 0.2–1.2)
Total Protein: 7.7 g/dL (ref 6.0–8.3)

## 2021-10-16 LAB — LIPID PANEL
Cholesterol: 167 mg/dL (ref 0–200)
HDL: 43.2 mg/dL (ref >39.00)
NonHDL: 124.15
Total CHOL/HDL Ratio: 4
Triglycerides: 226 mg/dL — ABNORMAL HIGH (ref 0.0–149.0)
VLDL: 45.2 mg/dL — ABNORMAL HIGH (ref 0.0–40.0)

## 2021-10-16 LAB — LDL CHOLESTEROL, DIRECT: Direct LDL: 106 mg/dL

## 2021-10-16 LAB — HEMOGLOBIN A1C: Hgb A1c MFr Bld: 7 % — ABNORMAL HIGH (ref 4.6–6.5)

## 2021-10-21 ENCOUNTER — Other Ambulatory Visit: Payer: Self-pay

## 2021-10-21 DIAGNOSIS — I10 Essential (primary) hypertension: Secondary | ICD-10-CM

## 2021-10-21 DIAGNOSIS — E1142 Type 2 diabetes mellitus with diabetic polyneuropathy: Secondary | ICD-10-CM

## 2021-10-21 MED ORDER — LOSARTAN POTASSIUM 100 MG PO TABS: 100.0000 mg | ORAL_TABLET | Freq: Every day | ORAL | 1 refills | Status: DC

## 2021-10-21 MED ORDER — PREGABALIN 150 MG PO CAPS: 150.0000 mg | ORAL_CAPSULE | Freq: Two times a day (BID) | ORAL | 2 refills | Status: DC

## 2021-10-21 MED ORDER — PREGABALIN 150 MG PO CAPS
150.0000 mg | ORAL_CAPSULE | Freq: Two times a day (BID) | ORAL | 2 refills | Status: DC
Start: 2021-10-21 — End: 2021-10-23

## 2021-10-23 ENCOUNTER — Other Ambulatory Visit: Payer: Self-pay

## 2021-10-23 ENCOUNTER — Ambulatory Visit: Payer: BC Managed Care – PPO | Admitting: Endocrinology

## 2021-10-23 ENCOUNTER — Encounter: Payer: Self-pay | Admitting: Endocrinology

## 2021-10-23 VITALS — BP 130/84 | HR 87 | Ht 72.0 in | Wt 292.4 lb

## 2021-10-23 DIAGNOSIS — R7989 Other specified abnormal findings of blood chemistry: Secondary | ICD-10-CM

## 2021-10-23 DIAGNOSIS — E1165 Type 2 diabetes mellitus with hyperglycemia: Secondary | ICD-10-CM | POA: Diagnosis not present

## 2021-10-23 DIAGNOSIS — I10 Essential (primary) hypertension: Secondary | ICD-10-CM

## 2021-10-23 DIAGNOSIS — E7800 Pure hypercholesterolemia, unspecified: Secondary | ICD-10-CM | POA: Diagnosis not present

## 2021-10-23 MED ORDER — GABAPENTIN 600 MG PO TABS: 600.0000 mg | ORAL_TABLET | Freq: Three times a day (TID) | ORAL | 1 refills | Status: DC

## 2021-10-23 MED ORDER — ROSUVASTATIN CALCIUM 10 MG PO TABS: 10.0000 mg | ORAL_TABLET | Freq: Every day | ORAL | 3 refills | Status: DC

## 2021-10-23 NOTE — Patient Instructions (Addendum)
Try GLIMEPERIDE 1mg  at bedtime

## 2021-10-23 NOTE — Progress Notes (Signed)
Patient ID: Jack Wallace, male   DOB: 14-Jun-1961, 60 y.o.   MRN: [id number]   Reason for Appointment : Followup     History of Present Illness            Diagnosis: Type 2 diabetes mellitus, date of diagnosis: 1993         Past diabetes history: He has been treated with various drugs for his diabetes over the last several years. He has been taking metformin for at least 10 years and has been tolerating this. Over the years he has had additional medications to improve his control. He was also taking Actos previously and not clear if he had any side effects. This was stopped because of fear of long-term effects  In early 2014 he had poor control before he started walking and lost 15 pounds. Subsequently he was getting low blood sugar during the day especially before lunch and sometimes in the afternoon His glipizide was  reduced but even with 5 mg twice a day he was getting hypoglycemia.  This was reduced and Invokana added. Invokana was stopped because of lack of clear benefit in 09/2013 Because of inadequate control he was started on Victoza on his visit in 10/14 in addition to his metformin and glipizide. In 12/2017 he was started on Ozempic instead of Victoza  Recent history:   Non-insulin hypoglycemic drugs: Metformin Er 1500 mg daily,  Jardiance 25 in pm, Amaryl 1 mg before lunch, Ozempic 1.0 mg weekly  His A1c is 7.0    Current problems, blood sugar patterns and management: Recently his meter download shows blood sugars to be mildly increased around 1 AM with some generally declining sugars by morning except on couple of occasions but mostly higher readings around 10 PM  He says he has had COVID in September and has been less active although recently back to work  Overall appetite has not changed much and weight is recently about the same  He has now started checking blood sugars when he wakes up in the evening and these appear to be consistently high averaging 175   However blood sugars are appearing to be lower when he checks them in the early morning from taking the Amaryl at lunchtime  Only rarely will feel hypoglycemic and lowest reading recently 80  He does not think Ozempic causes nausea and he has some nausea on and off otherwise; even with delaying his Ozempic injection he does not get rid of nausea  Continues to take Jardiance and renal function abnormal   Meals at work: 1-3 am, at home 10-11 am or 3 PM  Monitors blood glucose:  1 times a day        Glucometer:  Contour  Blood Glucose readings from meter download/2 weeks   PRE-MEAL Fasting  Dinner Overnight Overall  Glucose range: 148-221   80-179   Mean/median: 175   142 152   POST-MEAL PC Breakfast PC Lunch 8 AM-12 noon  Glucose range:   85-225  Mean/median:   157   Previously  PRE-MEAL Overnight Mornings Dinner Bedtime Overall  Glucose range: 69-184 116-159     Mean/median: 134 135   135   POST-MEAL PC Breakfast PC Lunch PC Dinner  Glucose range:   ?  Mean/median:        Dietician visit: Most recent: 5/19  Wt Readings from Last 3 Encounters:  10/23/21 292 lb 6.4 oz (132.6 kg)  09/09/21 293 lb (132.9 kg)  07/22/21 287 lb  12.8 oz (130.5 kg)      Lab Results  Component Value Date   HGBA1C 7.0 (H) 10/16/2021   HGBA1C 7.4 (H) 07/18/2021   HGBA1C 7.1 (A) 07/03/2021   Lab Results  Component Value Date   MICROALBUR <0.7 07/22/2021   LDLCALC 63 04/15/2021   CREATININE 1.12 10/16/2021   Lab Results  Component Value Date   FRUCTOSAMINE 294 (H) 02/24/2018   FRUCTOSAMINE 314 (H) 11/25/2017   FRUCTOSAMINE 281 12/02/2016    OTHER active problems: See review of systems   Allergies as of 10/23/2021   No Known Allergies      Medication List        Accurate as of October 23, 2021  8:12 AM. If you have any questions, ask your nurse or doctor.          albuterol 108 (90 Base) MCG/ACT inhaler Commonly known as: VENTOLIN HFA Inhale 2 puffs into the lungs  every 6 (six) hours as needed for wheezing or shortness of breath.   Alpha Lipoic Acid 200 MG Caps Take 200 mg by mouth. 2 pills daily   aspirin 81 MG tablet Take 81 mg by mouth daily.   Bayer Microlet Lancets lancets Use to check blood sugar two times per day   benzonatate 100 MG capsule Commonly known as: TESSALON Take 1 capsule (100 mg total) by mouth 3 (three) times daily as needed.   CONTOUR NEXT EZ MONITOR w/Device Kit Use to check blood sugar two times per day   Contour Next Test test strip Generic drug: glucose blood TEST TWICE DAILY   diclofenac 75 MG EC tablet Commonly known as: VOLTAREN TAKE 1 TABLET BY MOUTH TWICE DAILY WITH FOOD   Fish Oil 500 MG Caps Take by mouth 3 (three) times daily.   fluticasone 50 MCG/ACT nasal spray Commonly known as: FLONASE Place 2 sprays into both nostrils daily.   glimepiride 1 MG tablet Commonly known as: AMARYL TAKE 2 TABLETS BY MOUTH AT BEDTIME   Jardiance 25 MG Tabs tablet Generic drug: empagliflozin TAKE 1 TABLET BY MOUTH EVERY DAY   losartan 100 MG tablet Commonly known as: COZAAR Take 1 tablet (100 mg total) by mouth daily.   magnesium oxide 400 (240 Mg) MG tablet Commonly known as: MAG-OX Take 1 tablet by mouth 3 (three) times daily. Take 1 in the morning and 2 in the evening.   metFORMIN 500 MG 24 hr tablet Commonly known as: GLUCOPHAGE-XR TAKE 4 TABLETS BY MOUTH EVERY DAY WITH SUPPER   multivitamin with minerals Tabs tablet Take 1 tablet by mouth daily.   Ozempic (1 MG/DOSE) 4 MG/3ML Sopn Generic drug: Semaglutide (1 MG/DOSE) INJECT 1 MG INTO SKIN ONCE WEEKLY   pantoprazole 40 MG tablet Commonly known as: PROTONIX TAKE 1 TABLET(40 MG) BY MOUTH TWICE DAILY   pravastatin 80 MG tablet Commonly known as: PRAVACHOL TAKE 1 TABLET(80 MG) BY MOUTH DAILY PATIENT WILL USE OF 40 MG SUPPLY FIRST   pregabalin 150 MG capsule Commonly known as: Lyrica Take 1 capsule (150 mg total) by mouth 2 (two) times  daily.   sertraline 100 MG tablet Commonly known as: ZOLOFT Take 2 tablets (200 mg total) by mouth daily.   vitamin A 25000 UNIT capsule Take 25,000 Units by mouth daily.   vitamin B-12 500 MCG tablet Commonly known as: CYANOCOBALAMIN Take 500 mcg by mouth daily.   vitamin C 1000 MG tablet Take 1,000 mg by mouth daily.   VITAMIN D PO Take 1 tablet by  mouth daily. D3 1000 IU   VITAMIN E PO Take 1 capsule by mouth daily. 400 IU daily        Allergies: No Known Allergies  Past Medical History:  Diagnosis Date   Depression    Diabetes mellitus without complication ([place])    Hyperlipidemia    Hypertension    Ischemic colitis [facility])     Past Surgical History:  Procedure Laterality Date   COLONOSCOPY  06/01/2014   NASAL SEPTUM SURGERY      Family History  Problem Relation Age of Onset   Hypertension Mother    Stroke Father    Diabetes Maternal Grandfather    Colon cancer Neg Hx    Throat cancer Neg Hx    Prostate cancer Neg Hx    Pancreatic cancer Neg Hx    Heart disease Neg Hx    Kidney disease Neg Hx    Liver disease Neg Hx     Social History:  reports that he has never smoked. He has never used smokeless tobacco. He reports that he does not drink alcohol and does not use drugs.    Review of Systems      HYPERTENSION: Taking 100 mg losartan Also on Jardiance May check blood pressure at work also  BP Readings from Last 3 Encounters:  10/23/21 130/84  09/09/21 (!) 152/88  07/22/21 126/88   Renal function as follows:  Lab Results  Component Value Date   CREATININE 1.12 10/16/2021   CREATININE 1.10 09/09/2021   CREATININE 1.30 07/18/2021        Hyperlipidemia: taking pravastatin  80 mg LDL is above 100 and triglycerides are still high He thinks this may be partly related to eating more ice cream in September when he had sore throat   Lab Results  Component Value Date   CHOL 167 10/16/2021   HDL 43.20 10/16/2021   LDLCALC 63 04/15/2021    LDLDIRECT 106.0 10/16/2021   TRIG 226.0 (H) 10/16/2021   CHOLHDL 4 10/16/2021     Lab Results  Component Value Date   ALT 13 10/16/2021     NEUROPATHY He has had symptoms of neuropathy in feet.  However he thinks the burning in his feet is worse now  He was tried on Lyrica 150 mg but he does not think this works any better than gabapentin He previously was on gabapentin 600 mg 3 capsules a day without adequate control  Had a normal eye exam and was seen by ophthalmologist in 5/21  Foot exam done today   Physical Examination:  BP 130/84 (BP Location: Left Arm, Patient Position: Sitting, Cuff Size: Normal)   Pulse 87   Ht 6' (1.829 m)   Wt 292 lb 6.4 oz (132.6 kg)   SpO2 94%   BMI 39.66 kg/m   Diabetic Foot Exam - Simple   Simple Foot Form Diabetic Foot exam was performed with the following findings: Yes   Visual Inspection No deformities, no ulcerations, no other skin breakdown bilaterally: Yes See comments: Yes Sensation Testing See comments: Yes Pulse Check Posterior Tibialis and Dorsalis pulse intact bilaterally: Yes Comments Mild callus formation on the first left metatarsal base Decreased monofilament sensation on the distal plantar surfaces of both feet otherwise intact       ASSESSMENT/PLAN:   Diabetes type 2 with obesity:  See history of present illness for detailed discussion of current diabetes management, blood sugar patterns and problems identified   A1c is 7%  Currently taking Amaryl 1  mg before lunch, along with Ozempic/Jardiance/metformin   His blood sugars at home are recently higher and not clear why this is not reflected in his A1c which is better  With more recently checking his blood sugars on waking up in the early evenings he now appears to have mostly high readings at that time but generally lower readings after taking his Amaryl at lunch at work With his having periodic nausea not clear if he will tolerate higher doses of Ozempic  although he does not think this causes nausea  Recently not exercising, also may have been off his diet in September when he was dealing with COVID symptoms   Plan: Since he has nausea recently will not increase Ozempic Also he can leave off the Ozempic for 3 to 4 days after his due day on Monday and see if it makes any difference Restart exercise when he can Consistently prudent diet Try to check more readings 2 hours after meals and also some on waking up  HYPERTENSION: On losartan Well-controlled and can also verify his blood pressure readings at work periodically   Renal function: Creatinine is stable  NEUROPATHY: Not well controlled with Lyrica and he can go back to 600 mg gabapentin, 2 at bedtime and 1 in the morning Needs to check his feet regularly as he has some decreased sensation on the plantar surfaces  Mixed hyperlipidemia: Not adequately controlled and we will switch him from pravastatin to Crestor 10 mg daily  High normal TSH in September: We will recheck on the next visit  Follow-up in 3 months  There are no Patient Instructions on file for this visit.       Elayne Snare 10/23/2021, 8:12 AM

## 2021-12-01 ENCOUNTER — Other Ambulatory Visit: Payer: Self-pay | Admitting: Endocrinology

## 2022-01-05 NOTE — Progress Notes (Signed)
Office Visit   Patient ID: Jack Wallace, male    DOB: 28-Sep-1961, 61 y.o.   MRN: [id number]   PCP: Mitzi Hansen, MD   Subjective:   Jack Wallace is a 61 y.o. year old male with hypertension, hyperlipidemia, diabetes, and depression who presents for follow up of chronic medical conditions.   His last routine office visit was in July 2022--no medication changes made. No new issues.  He returned to the office in September to discuss [place] since having COVID 19. CBC, BMP, TSH, B12. Labs essentially unremarkable aside from mild anemia at 12.8. Hemoglobin noted to be 15 in 2019. Last colonoscopy was in 2015  He wished to discuss chronic persistent nausea at today's visit. He notes that this because around 3-4 years ago but has worsened over the past 2 months. He wakes up feeling nauseous often times and symptoms continue throughout the day. He does not typically experience vomiting. Denies associated symptoms including vomiting, diarrhea, constipation, abdominal pain,melena/hematochezia, weight loss, change in appetite, heartburn, or dysphagia. Symptoms are not exacerbated by eating and do not improve with BM.  He denies any recent medication changes although does note that he was transitioned from victoza to ozempic which is congruent with chart review. He had discussed this with Dr. Dwyane Dee, whom he follows with for diabetes management, and was instructed to hold ozempic for an extra four days. He did not experience any change in symptoms, so held it for one week, still without change in symptoms.  He also reports taking voltaren pills daily for left ankle pain.  He has been seen by GI in the past for ischemic colitis, LOV 2018. He does not remember feeling nauseous at that time although GI notes do elude to him possibly having developed IBS.   ACTIVE MEDICATIONS   Outpatient Medications Prior to Visit  Medication Sig   albuterol (VENTOLIN HFA) 108 (90 Base) MCG/ACT inhaler Inhale 2 puffs  into the lungs every 6 (six) hours as needed for wheezing or shortness of breath.   Alpha Lipoic Acid 200 MG CAPS Take 200 mg by mouth. 2 pills daily   Ascorbic Acid (VITAMIN C) 1000 MG tablet Take 1,000 mg by mouth daily.   aspirin 81 MG tablet Take 81 mg by mouth daily.   BAYER MICROLET LANCETS lancets Use to check blood sugar two times per day   Beta Carotene (VITAMIN A) 25000 UNIT capsule Take 25,000 Units by mouth daily.   Blood Glucose Monitoring Suppl (CONTOUR NEXT EZ MONITOR) w/Device KIT Use to check blood sugar two times per day   Cholecalciferol (VITAMIN D PO) Take 1 tablet by mouth daily. D3 1000 IU   CONTOUR NEXT TEST test strip TEST TWICE DAILY   diclofenac (VOLTAREN) 75 MG EC tablet TAKE 1 TABLET BY MOUTH TWICE DAILY WITH FOOD   fluticasone (FLONASE) 50 MCG/ACT nasal spray Place 2 sprays into both nostrils daily.   gabapentin (NEURONTIN) 600 MG tablet Take 1 tablet (600 mg total) by mouth 3 (three) times daily.   glimepiride (AMARYL) 1 MG tablet TAKE 2 TABLETS BY MOUTH AT BEDTIME   JARDIANCE 25 MG TABS tablet TAKE 1 TABLET BY MOUTH EVERY DAY   losartan (COZAAR) 100 MG tablet Take 1 tablet (100 mg total) by mouth daily.   Magnesium Oxide 400 (240 MG) MG TABS Take 1 tablet by mouth 3 (three) times daily. Take 1 in the morning and 2 in the evening.   metFORMIN (GLUCOPHAGE-XR) 500 MG 24 hr tablet  TAKE 4 TABLETS BY MOUTH EVERY DAY WITH SUPPER   Multiple Vitamin (MULTIVITAMIN WITH MINERALS) TABS tablet Take 1 tablet by mouth daily.   pantoprazole (PROTONIX) 40 MG tablet TAKE 1 TABLET(40 MG) BY MOUTH TWICE DAILY   rosuvastatin (CRESTOR) 10 MG tablet Take 1 tablet (10 mg total) by mouth daily.   vitamin B-12 (CYANOCOBALAMIN) 500 MCG tablet Take 500 mcg by mouth daily.   VITAMIN E PO Take 1 capsule by mouth daily. 400 IU daily   [DISCONTINUED] benzonatate (TESSALON) 100 MG capsule Take 1 capsule (100 mg total) by mouth 3 (three) times daily as needed.   [DISCONTINUED] Omega-3 Fatty Acids  (FISH OIL) 500 MG CAPS Take by mouth 3 (three) times daily.   [DISCONTINUED] OZEMPIC, 1 MG/DOSE, 4 MG/3ML SOPN INJECT 1 MG INTO SKIN ONCE WEEKLY   [DISCONTINUED] sertraline (ZOLOFT) 100 MG tablet Take 2 tablets (200 mg total) by mouth daily.   No facility-administered medications prior to visit.     Objective:   BP 132/84 (BP Location: Left Arm, Patient Position: Sitting, Cuff Size: Normal)    Pulse 95    Temp 99.1 F (37.3 C) (Oral)    Wt 293 lb 8 oz (133.1 kg)    SpO2 97%    BMI 39.81 kg/m  Wt Readings from Last 3 Encounters:  01/06/22 293 lb 8 oz (133.1 kg)  10/23/21 292 lb 6.4 oz (132.6 kg)  09/09/21 293 lb (132.9 kg)    BP Readings from Last 3 Encounters:  01/06/22 132/84  10/23/21 130/84  09/09/21 (!) 152/88   General: chronically ill, obese Cardiac: RRR Pulm: lungs clear throughout GI: abdomen soft, non-tender, non-distended.   Assessment & Plan:   Problem List Items Addressed This Visit       Cardiovascular and Mediastinum   Hypertension associated with diabetes ([place]) (Chronic)    Blood pressure is at goal in the office today.  Continue losartan Check bmp and mag      Relevant Orders   CBC with Diff   Basic metabolic panel     Digestive   GERD (gastroesophageal reflux disease)    Current treatment: protonix 38m daily He has been on this dose for several years. I considered discussing taking a drug holiday at today's visit however, in light of his nausea and occasional vomiting, I do not think this would be an ideal time. Once we can get his nausea under better control, then I would consider discussing drug holiday. If he re-developed GERD sx, then I would recommend referral to GI.      Relevant Medications   ondansetron (ZOFRAN) 4 MG tablet     Other   Chronic nausea    HPI summary: 3-4 year history of progressive nausea, acutely worse over the past 2 months. See HPI for details. Assessment: suspect this to be medication related, in particular, ozempic.  He was previously on victoza and was transitioned to ozempic around 2019, which would fit with the timeline of his symptoms. He did hold the ozempic for a week and did not experience change in symptoms however often times, it takes a greater period of time before patients experience improvement. Additionally reports taking voltaren 7104mdaily for left ankle pain although I do not see that this has been refilled in a few years. Alternative offending medications include glimepiride and metformin, although he has been on these for many years without issue.  No red flag symptoms indicating urgent need for GI symptoms. Symptoms seem incongruent with gastroparesis. His  hemoglobin did drop ~2 grams on labs from 10/2021 so gastric ulcer could be considered, although less likely in the absence of pain.  Plan: Will have him hold Ozempic until he follows up with Dr. Dwyane Dee in February. I have advised him to notify our office if symptoms have not changed by that time, at which time I will place a referral to GI.      MDD (major depressive disorder)    Current treatment: zoloft 286m daily Symptoms are well controlled on current therapy. PHQ 9 is 12 today, incongruent with patient's affect and mood.  Continue current management      Relevant Medications   sertraline (ZOLOFT) 100 MG tablet   Other Visit Diagnoses     Immunization due    -  Primary   Relevant Orders   Pneumococcal conjugate vaccine 20-valent (Prevnar 20) (Completed)   Flu Vaccine QUAD 6+ mos PF IM (Fluarix Quad PF) (Completed)   Hypomagnesemia       Relevant Orders   Magnesium       Return in about 6 months (around 07/06/2022) for Diabetes follow up.   Pt discussed with Dr. GVenetia Maxon MD Internal Medicine Resident PGY-3 [facility] 01/06/2022 1:14 PM

## 2022-01-06 ENCOUNTER — Ambulatory Visit: Payer: BC Managed Care – PPO | Admitting: Internal Medicine

## 2022-01-06 ENCOUNTER — Encounter: Payer: Self-pay | Admitting: Internal Medicine

## 2022-01-06 VITALS — BP 132/84 | HR 95 | Temp 99.1°F | Wt 293.5 lb

## 2022-01-06 DIAGNOSIS — K219 Gastro-esophageal reflux disease without esophagitis: Secondary | ICD-10-CM

## 2022-01-06 DIAGNOSIS — I152 Hypertension secondary to endocrine disorders: Secondary | ICD-10-CM

## 2022-01-06 DIAGNOSIS — F321 Major depressive disorder, single episode, moderate: Secondary | ICD-10-CM

## 2022-01-06 DIAGNOSIS — Z23 Encounter for immunization: Secondary | ICD-10-CM | POA: Diagnosis not present

## 2022-01-06 DIAGNOSIS — E875 Hyperkalemia: Secondary | ICD-10-CM

## 2022-01-06 DIAGNOSIS — R110 Nausea: Secondary | ICD-10-CM

## 2022-01-06 DIAGNOSIS — E1159 Type 2 diabetes mellitus with other circulatory complications: Secondary | ICD-10-CM

## 2022-01-06 MED ORDER — ONDANSETRON HCL 4 MG PO TABS: 4.0000 mg | ORAL_TABLET | Freq: Two times a day (BID) | ORAL | 1 refills | Status: DC | PRN

## 2022-01-06 MED ORDER — SERTRALINE HCL 100 MG PO TABS: 200.0000 mg | ORAL_TABLET | Freq: Every day | ORAL | 1 refills | Status: DC

## 2022-01-06 NOTE — Assessment & Plan Note (Signed)
HPI summary: 3-4 year history of progressive nausea, acutely worse over the past 2 months. See HPI for details. Assessment: suspect this to be medication related, in particular, ozempic. He was previously on victoza and was transitioned to ozempic around 2019, which would fit with the timeline of his symptoms. He did hold the ozempic for a week and did not experience change in symptoms however often times, it takes a greater period of time before patients experience improvement. Additionally reports taking voltaren 75mg  daily for left ankle pain although I do not see that this has been refilled in a few years. Alternative offending medications include glimepiride and metformin, although he has been on these for many years without issue.  No red flag symptoms indicating urgent need for GI symptoms. Symptoms seem incongruent with gastroparesis. His hemoglobin did drop ~2 grams on labs from 10/2021 so gastric ulcer could be considered, although less likely in the absence of pain.  Plan: Will have him hold Ozempic until he follows up with Dr. Dwyane Dee in February. I have advised him to notify our office if symptoms have not changed by that time, at which time I will place a referral to GI.

## 2022-01-06 NOTE — Patient Instructions (Signed)
[name], it was a pleasure seeing you today!  Today we discussed:  Nausea I would like you to discontinue Ozempic until you follow up with Dr. Dwyane Dee. Make sure to mention to him that you have been having the nausea at your visit. If you continue to experience persistent nausea after having discontinued the Ozempic for 2-4 weeks, please let me know and I will place a referral to GI.    Follow-up: 6 months or sooner if symptoms persist   Please make sure to arrive 15 minutes prior to your next appointment. If you arrive late, you may be asked to reschedule.   We look forward to seeing you next time. Please call our clinic at [phone] if you have any questions or concerns. The best time to call is Monday-Friday from 9am-4pm, but there is someone available 24/7. If after hours or the weekend, call the main hospital number and ask for the Internal Medicine Resident On-Call. If you need medication refills, please notify your pharmacy one week in advance and they will send [place] a request.  Thank you for letting us take part in your care. Wishing you the best!

## 2022-01-06 NOTE — Assessment & Plan Note (Signed)
Blood pressure is at goal in the office today.   Continue losartan  Check bmp and mag

## 2022-01-06 NOTE — Assessment & Plan Note (Signed)
Current treatment: zoloft 200mg  daily Symptoms are well controlled on current therapy. PHQ 9 is 12 today, incongruent with patient's affect and mood.  Continue current management

## 2022-01-06 NOTE — Assessment & Plan Note (Signed)
Current treatment: protonix 80mg  daily He has been on this dose for several years. I considered discussing taking a drug holiday at today's visit however, in light of his nausea and occasional vomiting, I do not think this would be an ideal time. Once we can get his nausea under better control, then I would consider discussing drug holiday. If he re-developed GERD sx, then I would recommend referral to GI.

## 2022-01-07 ENCOUNTER — Other Ambulatory Visit: Payer: Self-pay

## 2022-01-07 DIAGNOSIS — E875 Hyperkalemia: Secondary | ICD-10-CM | POA: Insufficient documentation

## 2022-01-07 LAB — BASIC METABOLIC PANEL
BUN/Creatinine Ratio: 17 (ref 10–24)
BUN: 22 mg/dL (ref 8–27)
CO2: 22 mmol/L (ref 20–29)
Calcium: 10.2 mg/dL (ref 8.6–10.2)
Chloride: 101 mmol/L (ref 96–106)
Creatinine, Ser: 1.31 mg/dL — ABNORMAL HIGH (ref 0.76–1.27)
Glucose: 110 mg/dL — ABNORMAL HIGH (ref 70–99)
Potassium: 5.3 mmol/L — ABNORMAL HIGH (ref 3.5–5.2)
Sodium: 141 mmol/L (ref 134–144)
eGFR: 62 mL/min/{1.73_m2} (ref >59)

## 2022-01-07 LAB — CBC WITH DIFFERENTIAL/PLATELET
Basophils Absolute: 0.1 10*3/uL (ref 0.0–0.2)
Basos: 1 %
EOS (ABSOLUTE): 0.1 10*3/uL (ref 0.0–0.4)
Eos: 1 %
Hematocrit: 41.6 % (ref 37.5–51.0)
Hemoglobin: 13.3 g/dL (ref 13.0–17.7)
Immature Grans (Abs): 0 10*3/uL (ref 0.0–0.1)
Immature Granulocytes: 0 %
Lymphocytes Absolute: 3.6 10*3/uL — ABNORMAL HIGH (ref 0.7–3.1)
Lymphs: 43 %
MCH: 25.9 pg — ABNORMAL LOW (ref 26.6–33.0)
MCHC: 32 g/dL (ref 31.5–35.7)
MCV: 81 fL (ref 79–97)
Monocytes Absolute: 0.8 10*3/uL (ref 0.1–0.9)
Monocytes: 10 %
Neutrophils Absolute: 3.7 10*3/uL (ref 1.4–7.0)
Neutrophils: 45 %
Platelets: 294 10*3/uL (ref 150–450)
RBC: 5.14 x10E6/uL (ref 4.14–5.80)
RDW: 15 % (ref 11.6–15.4)
WBC: 8.3 10*3/uL (ref 3.4–10.8)

## 2022-01-07 LAB — MAGNESIUM: Magnesium: 1.7 mg/dL (ref 1.6–2.3)

## 2022-01-07 MED ORDER — LOKELMA 10 G PO PACK: 10.0000 g | PACK | Freq: Every day | ORAL | 0 refills | Status: DC

## 2022-01-07 NOTE — Addendum Note (Signed)
Addended by: Mitzi Hansen on: 01/07/2022 11:41 AM   Modules accepted: Orders

## 2022-01-07 NOTE — Assessment & Plan Note (Signed)
Mildly elevated at 5.3. Slight bump in creatinine. Will send in a 1x dose of lokelma and have him return on Friday or Monday for repeat labs.  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 I have followed this request and sent him a message. If no reply by tomorrow, will reach out via telephone.

## 2022-01-09 NOTE — Progress Notes (Signed)
Internal Medicine Clinic Attending  Case discussed with Dr. Christian  At the time of the visit.  We reviewed the resident's history and exam and pertinent patient test results.  I agree with the assessment, diagnosis, and plan of care documented in the resident's note.  

## 2022-01-12 ENCOUNTER — Other Ambulatory Visit (INDEPENDENT_AMBULATORY_CARE_PROVIDER_SITE_OTHER): Payer: BC Managed Care – PPO

## 2022-01-12 DIAGNOSIS — E875 Hyperkalemia: Secondary | ICD-10-CM | POA: Diagnosis not present

## 2022-01-13 LAB — BASIC METABOLIC PANEL
BUN/Creatinine Ratio: 15 (ref 10–24)
BUN: 15 mg/dL (ref 8–27)
CO2: 23 mmol/L (ref 20–29)
Calcium: 9.5 mg/dL (ref 8.6–10.2)
Chloride: 104 mmol/L (ref 96–106)
Creatinine, Ser: 1 mg/dL (ref 0.76–1.27)
Glucose: 106 mg/dL — ABNORMAL HIGH (ref 70–99)
Potassium: 4.5 mmol/L (ref 3.5–5.2)
Sodium: 142 mmol/L (ref 134–144)
eGFR: 86 mL/min/{1.73_m2} (ref >59)

## 2022-01-13 NOTE — Addendum Note (Signed)
Addended by: Mitzi Hansen on: 01/13/2022 01:27 PM   Modules accepted: Orders

## 2022-01-22 ENCOUNTER — Other Ambulatory Visit: Payer: Self-pay

## 2022-01-22 ENCOUNTER — Other Ambulatory Visit (INDEPENDENT_AMBULATORY_CARE_PROVIDER_SITE_OTHER): Payer: BC Managed Care – PPO

## 2022-01-22 DIAGNOSIS — R7989 Other specified abnormal findings of blood chemistry: Secondary | ICD-10-CM | POA: Diagnosis not present

## 2022-01-22 DIAGNOSIS — E7800 Pure hypercholesterolemia, unspecified: Secondary | ICD-10-CM

## 2022-01-22 DIAGNOSIS — E1165 Type 2 diabetes mellitus with hyperglycemia: Secondary | ICD-10-CM | POA: Diagnosis not present

## 2022-01-22 LAB — MICROALBUMIN / CREATININE URINE RATIO
Creatinine,U: 142.4 mg/dL
Microalb Creat Ratio: 0.8 mg/g (ref 0.0–30.0)
Microalb, Ur: 1.1 mg/dL (ref 0.0–1.9)

## 2022-01-22 LAB — COMPREHENSIVE METABOLIC PANEL
ALT: 15 U/L (ref 0–53)
AST: 18 U/L (ref 0–37)
Albumin: 4.9 g/dL (ref 3.5–5.2)
Alkaline Phosphatase: 61 U/L (ref 39–117)
BUN: 19 mg/dL (ref 6–23)
CO2: 28 mEq/L (ref 19–32)
Calcium: 10.3 mg/dL (ref 8.4–10.5)
Chloride: 103 mEq/L (ref 96–112)
Creatinine, Ser: 1.35 mg/dL (ref 0.40–1.50)
GFR: 56.72 mL/min — ABNORMAL LOW (ref >60.00)
Glucose, Bld: 107 mg/dL — ABNORMAL HIGH (ref 70–99)
Potassium: 4.8 mEq/L (ref 3.5–5.1)
Sodium: 142 mEq/L (ref 135–145)
Total Bilirubin: 0.7 mg/dL (ref 0.2–1.2)
Total Protein: 7.8 g/dL (ref 6.0–8.3)

## 2022-01-22 LAB — LIPID PANEL
Cholesterol: 131 mg/dL (ref 0–200)
HDL: 40.9 mg/dL (ref >39.00)
NonHDL: 89.96
Total CHOL/HDL Ratio: 3
Triglycerides: 201 mg/dL — ABNORMAL HIGH (ref 0.0–149.0)
VLDL: 40.2 mg/dL — ABNORMAL HIGH (ref 0.0–40.0)

## 2022-01-22 LAB — TSH: TSH: 3.3 u[IU]/mL (ref 0.35–5.50)

## 2022-01-22 LAB — HEMOGLOBIN A1C: Hgb A1c MFr Bld: 8 % — ABNORMAL HIGH (ref 4.6–6.5)

## 2022-01-22 LAB — T4, FREE: Free T4: 0.89 ng/dL (ref 0.60–1.60)

## 2022-01-22 LAB — LDL CHOLESTEROL, DIRECT: Direct LDL: 69 mg/dL

## 2022-01-22 MED ORDER — METFORMIN HCL ER 500 MG PO TB24: ORAL_TABLET | ORAL | 2 refills | Status: DC

## 2022-01-28 ENCOUNTER — Other Ambulatory Visit: Payer: Self-pay

## 2022-01-28 ENCOUNTER — Encounter: Payer: Self-pay | Admitting: Endocrinology

## 2022-01-28 ENCOUNTER — Ambulatory Visit: Payer: BC Managed Care – PPO | Admitting: Endocrinology

## 2022-01-28 VITALS — BP 124/78 | HR 84 | Ht 72.0 in | Wt 294.8 lb

## 2022-01-28 DIAGNOSIS — E7800 Pure hypercholesterolemia, unspecified: Secondary | ICD-10-CM

## 2022-01-28 DIAGNOSIS — E1165 Type 2 diabetes mellitus with hyperglycemia: Secondary | ICD-10-CM

## 2022-01-28 DIAGNOSIS — R110 Nausea: Secondary | ICD-10-CM

## 2022-01-28 DIAGNOSIS — E1142 Type 2 diabetes mellitus with diabetic polyneuropathy: Secondary | ICD-10-CM | POA: Diagnosis not present

## 2022-01-28 NOTE — Progress Notes (Signed)
Patient ID: Jack Wallace, male   DOB: 12-23-1960, 61 y.o.   MRN: [id number]   Reason for Appointment : Followup     History of Present Illness            Diagnosis: Type 2 diabetes mellitus, date of diagnosis: 1993         Past diabetes history: He has been treated with various drugs for his diabetes over the last several years. He has been taking metformin for at least 10 years and has been tolerating this. Over the years he has had additional medications to improve his control. He was also taking Actos previously and not clear if he had any side effects. This was stopped because of fear of long-term effects  In early 2014 he had poor control before he started walking and lost 15 pounds. Subsequently he was getting low blood sugar during the day especially before lunch and sometimes in the afternoon His glipizide was  reduced but even with 5 mg twice a day he was getting hypoglycemia.  This was reduced and Invokana added. Invokana was stopped because of lack of clear benefit in 09/2013 Because of inadequate control he was started on Victoza on his visit in 10/14 in addition to his metformin and glipizide. In 12/2017 he was started on Ozempic instead of Victoza  Recent history:   Non-insulin hypoglycemic drugs: Metformin Er 1500 mg daily,  Jardiance 25 in pm, Amaryl 1 mg before lunch and hs   His A1c is 8 compared to 7.0    Current problems, blood sugar patterns and management: After his last visit he went to his PCP because of persistent nausea  He was told to stop his Ozempic about 3 weeks ago and was also given Zofran for nausea With this his nausea is significantly better but he still takes Zofran He has not exercised Although on the average he has about the same blood sugars he seems to have relatively high readings before his lunch meal at work With his high sugars he has started taking another 1 mg Amaryl in the morning hours before he goes to sleep Thyroid does not  check his blood sugars when he wakes up in the evening Because of not being able to afford his membership he has not gone to the [facility] for exercise and is not doing any walking Weight is about the same  Meals at work: 1-3 am, at home 10-11 am or 3 PM  Monitors blood glucose:  1 times a day        Glucometer:  Contour  Blood Glucose readings from meter download/2 weeks  Blood sugar patterns: Blood sugars are mostly high when checked between 1 AM-3 AM Blood sugars are LOWEST between 6-8 AM Blood sugars may be moderately higher when checked around noon  PRE-MEAL Overnight /a.m. Lunch Dinner 8 AM- 12 noon Overall  Glucose range: 88-195   145-191   Mean/median: 151 182  158 154    PRE-MEAL Fasting  Dinner Overnight Overall  Glucose range: 148-221   80-179   Mean/median: 175   142 152   POST-MEAL PC Breakfast PC Lunch 8 AM-12 noon  Glucose range:   85-225  Mean/median:   157     Dietician visit: Most recent: 5/19  Wt Readings from Last 3 Encounters:  01/28/22 294 lb 12.8 oz (133.7 kg)  01/06/22 293 lb 8 oz (133.1 kg)  10/23/21 292 lb 6.4 oz (132.6 kg)      Lab  Results  Component Value Date   HGBA1C 8.0 (H) 01/22/2022   HGBA1C 7.0 (H) 10/16/2021   HGBA1C 7.4 (H) 07/18/2021   Lab Results  Component Value Date   MICROALBUR 1.1 01/22/2022   LDLCALC 63 04/15/2021   CREATININE 1.35 01/22/2022   Lab Results  Component Value Date   FRUCTOSAMINE 294 (H) 02/24/2018   FRUCTOSAMINE 314 (H) 11/25/2017   FRUCTOSAMINE 281 12/02/2016    OTHER active problems: See review of systems   Allergies as of 01/28/2022   No Known Allergies      Medication List        Accurate as of January 28, 2022  8:25 AM. If you have any questions, ask your nurse or doctor.          albuterol 108 (90 Base) MCG/ACT inhaler Commonly known as: VENTOLIN HFA Inhale 2 puffs into the lungs every 6 (six) hours as needed for wheezing or shortness of breath.   Alpha Lipoic Acid 200 MG Caps Take  200 mg by mouth. 2 pills daily   aspirin 81 MG tablet Take 81 mg by mouth daily.   Bayer Microlet Lancets lancets Use to check blood sugar two times per day   CONTOUR NEXT EZ MONITOR w/Device Kit Use to check blood sugar two times per day   Contour Next Test test strip Generic drug: glucose blood TEST TWICE DAILY   diclofenac 75 MG EC tablet Commonly known as: VOLTAREN TAKE 1 TABLET BY MOUTH TWICE DAILY WITH FOOD   fluticasone 50 MCG/ACT nasal spray Commonly known as: FLONASE Place 2 sprays into both nostrils daily.   gabapentin 600 MG tablet Commonly known as: NEURONTIN Take 1 tablet (600 mg total) by mouth 3 (three) times daily.   glimepiride 1 MG tablet Commonly known as: AMARYL TAKE 2 TABLETS BY MOUTH AT BEDTIME   Jardiance 25 MG Tabs tablet Generic drug: empagliflozin TAKE 1 TABLET BY MOUTH EVERY DAY   Lokelma 10 g Pack packet Generic drug: sodium zirconium cyclosilicate Take 10 g by mouth daily.   losartan 100 MG tablet Commonly known as: COZAAR Take 1 tablet (100 mg total) by mouth daily.   magnesium oxide 400 (240 Mg) MG tablet Commonly known as: MAG-OX Take 1 tablet by mouth 3 (three) times daily. Take 1 in the morning and 2 in the evening.   metFORMIN 500 MG 24 hr tablet Commonly known as: GLUCOPHAGE-XR TAKE 4 TABLETS BY MOUTH EVERY DAY WITH SUPPER   multivitamin with minerals Tabs tablet Take 1 tablet by mouth daily.   ondansetron 4 MG tablet Commonly known as: Zofran Take 1 tablet (4 mg total) by mouth 2 (two) times daily as needed for nausea or vomiting.   pantoprazole 40 MG tablet Commonly known as: PROTONIX TAKE 1 TABLET(40 MG) BY MOUTH TWICE DAILY   rosuvastatin 10 MG tablet Commonly known as: Crestor Take 1 tablet (10 mg total) by mouth daily.   sertraline 100 MG tablet Commonly known as: ZOLOFT Take 2 tablets (200 mg total) by mouth daily.   vitamin A 25000 UNIT capsule Take 25,000 Units by mouth daily.   vitamin B-12 500 MCG  tablet Commonly known as: CYANOCOBALAMIN Take 500 mcg by mouth daily.   vitamin C 1000 MG tablet Take 1,000 mg by mouth daily.   VITAMIN D PO Take 1 tablet by mouth daily. D3 1000 IU   VITAMIN E PO Take 1 capsule by mouth daily. 400 IU daily        Allergies: No  Known Allergies  Past Medical History:  Diagnosis Date   Depression    Diabetes mellitus without complication ([place])    Hyperlipidemia    Hypertension    Ischemic colitis [facility])     Past Surgical History:  Procedure Laterality Date   COLONOSCOPY  06/01/2014   NASAL SEPTUM SURGERY      Family History  Problem Relation Age of Onset   Hypertension Mother    Stroke Father    Diabetes Maternal Grandfather    Colon cancer Neg Hx    Throat cancer Neg Hx    Prostate cancer Neg Hx    Pancreatic cancer Neg Hx    Heart disease Neg Hx    Kidney disease Neg Hx    Liver disease Neg Hx     Social History:  reports that he has never smoked. He has never used smokeless tobacco. He reports that he does not drink alcohol and does not use drugs.    Review of Systems      HYPERTENSION: Taking 100 mg losartan Also on Jardiance May check blood pressure at work also  BP Readings from Last 3 Encounters:  01/28/22 124/78  01/06/22 132/84  10/23/21 130/84   Renal function as follows:  Lab Results  Component Value Date   CREATININE 1.35 01/22/2022   CREATININE 1.00 01/12/2022   CREATININE 1.31 (H) 01/06/2022        Hyperlipidemia: taking CRESTOR 10 mg LDL is now below 100 and triglycerides are still high, previously LDL was higher with pravastatin   Lab Results  Component Value Date   CHOL 131 01/22/2022   HDL 40.90 01/22/2022   LDLCALC 63 04/15/2021   LDLDIRECT 69.0 01/22/2022   TRIG 201.0 (H) 01/22/2022   CHOLHDL 3 01/22/2022     Lab Results  Component Value Date   ALT 15 01/22/2022     NEUROPATHY He has had symptoms of neuropathy in feet.  However he thinks the burning in his feet is worse  now  He was tried on Lyrica 150 mg but he does not think this works any better than gabapentin He previously was on gabapentin 600 mg 3 capsules a day without adequate control  Had a normal eye exam and was seen by ophthalmologist in 5/21  Foot exam done in 11/22   Physical Examination:  BP 124/78 (BP Location: Left Arm, Patient Position: Sitting, Cuff Size: Normal)    Pulse 84    Ht 6' (1.829 m)    Wt 294 lb 12.8 oz (133.7 kg)    SpO2 93%    BMI 39.98 kg/m     ASSESSMENT/PLAN:   Diabetes type 2 with obesity:  See history of present illness for detailed discussion of current diabetes management, blood sugar patterns and problems identified   A1c is 8%  Currently taking Amaryl 1 mg before lunch and bedtime, along with Jardiance/metformin   His A1c is likely higher from not taking his Ozempic especially in the last 3 weeks or so  Does not appear to have consistently high readings but his blood sugars are near normal in the early morning hours including on his lab  Likely has higher blood sugars between the time he wakes up and his lunchtime which are not monitored  Again has not exercised Weight is about the same   Plan: Since he has residual nausea recently will check gastric emptying study which she has not had to establish diagnosis  May consider using Reglan if he has gastroparesis  Also for better efficacy may switch to [facility] if tolerated subsequently  We will need to check more readings when he wakes up in the evening Also since his blood sugars are higher at lunch he will take his Amaryl on waking up in the evening instead of waiting till lunchtime  Short-term follow-up in 2 months  Lipids: Well-controlled LDL with switching to Crestor but is still having high triglycerides, may improve with some weight loss and better diabetes control  There are no Patient Instructions on file for this visit.       Elayne Snare 01/28/2022, 8:25 AM

## 2022-01-28 NOTE — Patient Instructions (Signed)
Take Glimeperide before going to work Sugars when waking up

## 2022-02-06 ENCOUNTER — Ambulatory Visit (HOSPITAL_COMMUNITY): Payer: BC Managed Care – PPO

## 2022-02-06 ENCOUNTER — Encounter (HOSPITAL_COMMUNITY): Payer: Self-pay

## 2022-02-16 ENCOUNTER — Other Ambulatory Visit: Payer: Self-pay | Admitting: Endocrinology

## 2022-02-20 ENCOUNTER — Encounter: Payer: Self-pay | Admitting: Endocrinology

## 2022-02-20 DIAGNOSIS — E7800 Pure hypercholesterolemia, unspecified: Secondary | ICD-10-CM

## 2022-02-20 MED ORDER — ROSUVASTATIN CALCIUM 10 MG PO TABS: 10.0000 mg | ORAL_TABLET | Freq: Every day | ORAL | 3 refills | Status: DC

## 2022-02-26 ENCOUNTER — Encounter: Payer: Self-pay | Admitting: Endocrinology

## 2022-02-27 ENCOUNTER — Other Ambulatory Visit: Payer: Self-pay | Admitting: Endocrinology

## 2022-02-27 DIAGNOSIS — E1165 Type 2 diabetes mellitus with hyperglycemia: Secondary | ICD-10-CM

## 2022-03-02 ENCOUNTER — Other Ambulatory Visit: Payer: Self-pay

## 2022-03-02 ENCOUNTER — Telehealth: Payer: Self-pay

## 2022-03-02 ENCOUNTER — Other Ambulatory Visit: Payer: Self-pay | Admitting: Endocrinology

## 2022-03-02 ENCOUNTER — Other Ambulatory Visit (HOSPITAL_COMMUNITY): Payer: Self-pay

## 2022-03-02 MED ORDER — ONDANSETRON HCL 4 MG PO TABS: 4.0000 mg | ORAL_TABLET | Freq: Two times a day (BID) | ORAL | 1 refills | Status: AC | PRN

## 2022-03-02 NOTE — Telephone Encounter (Signed)
Patient Advocate Encounter ?  ?Received notification from [facility] that prior authorization for Mounjaro 2.'5mg'$ /0.95m pen injectors is required by his/her insurance [facility] ?  ?PA submitted on 03/02/22 ? ?Key#: [id number] ? ?Status is pending ?   ?[facility] will continue to follow: ? ?Patient Advocate ?Fax: [phone] ?

## 2022-03-03 ENCOUNTER — Encounter: Payer: Self-pay | Admitting: Endocrinology

## 2022-03-03 ENCOUNTER — Other Ambulatory Visit (HOSPITAL_COMMUNITY): Payer: Self-pay

## 2022-03-03 NOTE — Telephone Encounter (Signed)
Patient Advocate Encounter ? ?Prior Authorization for [employer] 2.'5mg'$ /0.86m pen injectors has been approved.   ? ?PA# N/A ? ?Effective dates: 03/02/22 through 03/01/23 ? ?Per Test Claim Patients co-pay is $25.  ? ?Spoke with Pharmacy to Process. ? ?Patient Advocate ?Fax: [phone] ?

## 2022-03-04 ENCOUNTER — Other Ambulatory Visit (HOSPITAL_COMMUNITY): Payer: Self-pay

## 2022-03-17 ENCOUNTER — Other Ambulatory Visit (INDEPENDENT_AMBULATORY_CARE_PROVIDER_SITE_OTHER): Payer: BC Managed Care – PPO

## 2022-03-17 ENCOUNTER — Other Ambulatory Visit: Payer: Self-pay

## 2022-03-17 DIAGNOSIS — E1165 Type 2 diabetes mellitus with hyperglycemia: Secondary | ICD-10-CM | POA: Diagnosis not present

## 2022-03-17 LAB — BASIC METABOLIC PANEL
BUN: 21 mg/dL (ref 6–23)
CO2: 28 mEq/L (ref 19–32)
Calcium: 10.4 mg/dL (ref 8.4–10.5)
Chloride: 102 mEq/L (ref 96–112)
Creatinine, Ser: 1.37 mg/dL (ref 0.40–1.50)
GFR: 55.67 mL/min — ABNORMAL LOW (ref >60.00)
Glucose, Bld: 136 mg/dL — ABNORMAL HIGH (ref 70–99)
Potassium: 4.9 mEq/L (ref 3.5–5.1)
Sodium: 141 mEq/L (ref 135–145)

## 2022-03-18 LAB — FRUCTOSAMINE: Fructosamine: 315 umol/L — ABNORMAL HIGH (ref 0–285)

## 2022-03-24 ENCOUNTER — Other Ambulatory Visit: Payer: BC Managed Care – PPO

## 2022-03-25 ENCOUNTER — Other Ambulatory Visit: Payer: BC Managed Care – PPO

## 2022-03-31 ENCOUNTER — Encounter: Payer: Self-pay | Admitting: Endocrinology

## 2022-03-31 ENCOUNTER — Ambulatory Visit: Payer: BC Managed Care – PPO | Admitting: Endocrinology

## 2022-03-31 VITALS — BP 105/80 | HR 80 | Ht 72.0 in | Wt 285.0 lb

## 2022-03-31 DIAGNOSIS — E1165 Type 2 diabetes mellitus with hyperglycemia: Secondary | ICD-10-CM

## 2022-03-31 DIAGNOSIS — I10 Essential (primary) hypertension: Secondary | ICD-10-CM

## 2022-03-31 MED ORDER — INSULIN PEN NEEDLE 31G X 5 MM MISC: 1 refills | Status: DC

## 2022-03-31 NOTE — Patient Instructions (Addendum)
Jack Wallace insulin: This insulin provides blood sugar control for up to 24 hours.   ?Start with 6 units at bedtime daily and increase by 2 units every 3 days until the waking up sugars are under 130. ?Then continue the same dose. ? If blood sugar is under 90 for 2 days in a row, reduce the dose by 2 units.  ?Note that this insulin does not control the rise of blood sugar with meals   ? ?No Losartan or Jardiance till BP over 120 ? ?

## 2022-03-31 NOTE — Progress Notes (Signed)
? ? ?Patient ID: Jack Wallace, male   DOB: February 22, 1961, 61 y.o.   MRN: [id number] ? ? ?Reason for Appointment : Followup  ? ?  ?History of Present Illness  ?  ?        ?Diagnosis: Type 2 diabetes mellitus, date of diagnosis: 1993  ?      ? ?Past diabetes history: He has been treated with various drugs for his diabetes over the last several years. He has been taking metformin for at least 10 years and has been tolerating this. Over the years he has had additional medications to improve his control. ?He was also taking Actos previously and not clear if he had any side effects. This was stopped because of fear of long-term effects ? ?In early 2014 he had poor control before he started walking and lost 15 pounds. Subsequently he was getting low blood sugar during the day especially before lunch and sometimes in the afternoon His glipizide was  reduced but even with 5 mg twice a day he was getting hypoglycemia.  ?This was reduced and Invokana added. Invokana was stopped because of lack of clear benefit in 09/2013 ?Because of inadequate control he was started on Victoza on his visit in 10/14 in addition to his metformin and glipizide. ?In 12/2017 he was started on Ozempic instead of Victoza ? ?Recent history:  ? ?Non-insulin hypoglycemic drugs: Metformin Er 1500 mg daily,  Jardiance 25 in pm, Amaryl 2 mg  hs  ? ?His A1c is last 8 compared to 7.0  ?Fructosamine is 314 ? ?Current problems, blood sugar patterns and management: ?After his last visit he was tried on [facility] about a month ago but even with the 2.5 mg he was having significant nausea ?His blood sugars on the medication did not seem to be much better ?He has not taken this for over a week ?On an average his blood sugars are higher than on his last visit  ?However he is checking more blood sugars at different times including when he wakes up  ?He does not have significant when he is not taking GLP-1 drugs  ?Because of his decreased appetite from the  medications and the nausea he appears to have lost weight and may not be eating much at lunch at work  ?Not exercising currently ?Last night his blood sugar was unexpectedly high at 292 before eating and he is not sure why ? ?Meals at work: 1-3 am, at home 10-11 am or 3 PM ? ?Monitors blood glucose:  1 times a day        Glucometer:  Contour ? ?Blood Glucose readings from meter download  ? ? ?PRE-MEAL Fasting Lunch Dinner Bedtime Overall  ?Glucose range: 142-233 132-292  84-246   ?Mean/median: 025 4 2706  237 628  ? ?Previously: ? ?Blood sugar patterns: Blood sugars are mostly high when checked between 1 AM-3 AM ?Blood sugars are LOWEST between 6-8 AM ?Blood sugars may be moderately higher when checked around noon ? ?PRE-MEAL Overnight ?/a.m. Lunch Dinner 8 AM- ?12 noon Overall  ?Glucose range: 88-195   145-191   ?Mean/median: 151 182  158 154  ? ? ? ? ?Dietician visit: Most recent: 5/19 ? ?Wt Readings from Last 3 Encounters:  ?03/31/22 285 lb (129.3 kg)  ?01/28/22 294 lb 12.8 oz (133.7 kg)  ?01/06/22 293 lb 8 oz (133.1 kg)  ? ?   ?Lab Results  ?Component Value Date  ? HGBA1C 8.0 (H) 01/22/2022  ? HGBA1C 7.0 (H)  10/16/2021  ? HGBA1C 7.4 (H) 07/18/2021  ? ?Lab Results  ?Component Value Date  ? MICROALBUR 1.1 01/22/2022  ? [place] 63 04/15/2021  ? CREATININE 1.37 03/17/2022  ? ?Lab Results  ?Component Value Date  ? FRUCTOSAMINE 315 (H) 03/17/2022  ? FRUCTOSAMINE 294 (H) 02/24/2018  ? FRUCTOSAMINE 314 (H) 11/25/2017  ? ? ?OTHER active problems: See review of systems ? ? ?Allergies as of 03/31/2022   ?No Known Allergies ?  ? ?  ?Medication List  ?  ? ?  ? Accurate as of March 31, 2022 10:53 AM. If you have any questions, ask your nurse or doctor.  ?  ?  ? ?  ? ?STOP taking these medications   ? ?tirzepatide 2.5 MG/0.5ML Pen ?Commonly known as: MOUNJARO ?Stopped by: Elayne Snare, MD ?  ? ?  ? ?TAKE these medications   ? ?albuterol 108 (90 Base) MCG/ACT inhaler ?Commonly known as: VENTOLIN HFA ?Inhale 2 puffs into the lungs  every 6 (six) hours as needed for wheezing or shortness of breath. ?  ?Alpha Lipoic Acid 200 MG Caps ?Take 200 mg by mouth. 2 pills daily ?  ?aspirin 81 MG tablet ?Take 81 mg by mouth daily. ?  ?Haematologist lancets ?Use to check blood sugar two times per day ?  ?CONTOUR NEXT EZ MONITOR w/Device Kit ?Use to check blood sugar two times per day ?  ?Contour Next Test test strip ?Generic drug: glucose blood ?TEST TWICE DAILY ?  ?diclofenac 75 MG EC tablet ?Commonly known as: VOLTAREN ?TAKE 1 TABLET BY MOUTH TWICE DAILY WITH FOOD ?  ?fluticasone 50 MCG/ACT nasal spray ?Commonly known as: FLONASE ?Place 2 sprays into both nostrils daily. ?  ?gabapentin 600 MG tablet ?Commonly known as: NEURONTIN ?Take 1 tablet (600 mg total) by mouth 3 (three) times daily. ?  ?glimepiride 1 MG tablet ?Commonly known as: AMARYL ?Take 1 tablet before lunch and bedtime ?  ?Jardiance 25 MG Tabs tablet ?Generic drug: empagliflozin ?TAKE 1 TABLET BY MOUTH EVERY DAY ?  ?Lokelma 10 g Pack packet ?Generic drug: sodium zirconium cyclosilicate ?Take 10 g by mouth daily. ?  ?losartan 100 MG tablet ?Commonly known as: COZAAR ?Take 1 tablet (100 mg total) by mouth daily. ?  ?magnesium oxide 400 (240 Mg) MG tablet ?Commonly known as: MAG-OX ?Take 1 tablet by mouth 3 (three) times daily. Take 1 in the morning and 2 in the evening. ?  ?metFORMIN 500 MG 24 hr tablet ?Commonly known as: GLUCOPHAGE-XR ?TAKE 4 TABLETS BY MOUTH EVERY DAY WITH SUPPER ?  ?multivitamin with minerals Tabs tablet ?Take 1 tablet by mouth daily. ?  ?ondansetron 4 MG tablet ?Commonly known as: Zofran ?Take 1 tablet (4 mg total) by mouth 2 (two) times daily as needed for nausea or vomiting. ?  ?pantoprazole 40 MG tablet ?Commonly known as: PROTONIX ?TAKE 1 TABLET(40 MG) BY MOUTH TWICE DAILY ?  ?rosuvastatin 10 MG tablet ?Commonly known as: Crestor ?Take 1 tablet (10 mg total) by mouth daily. ?  ?sertraline 100 MG tablet ?Commonly known as: ZOLOFT ?Take 2 tablets (200 mg  total) by mouth daily. ?  ?vitamin A 25000 UNIT capsule ?Take 25,000 Units by mouth daily. ?  ?vitamin B-12 500 MCG tablet ?Commonly known as: CYANOCOBALAMIN ?Take 500 mcg by mouth daily. ?  ?vitamin C 1000 MG tablet ?Take 1,000 mg by mouth daily. ?  ?VITAMIN D PO ?Take 1 tablet by mouth daily. D3 1000 IU ?  ?VITAMIN E PO ?Take 1  capsule by mouth daily. 400 IU daily ?  ? ?  ? ? ?Allergies: No Known Allergies ? ?Past Medical History:  ?Diagnosis Date  ? Depression   ? Diabetes mellitus without complication ([place])   ? Hyperlipidemia   ? Hypertension   ? Ischemic colitis ([place])   ? ? ?Past Surgical History:  ?Procedure Laterality Date  ? COLONOSCOPY  06/01/2014  ? NASAL SEPTUM SURGERY    ? ? ?Family History  ?Problem Relation Age of Onset  ? Hypertension Mother   ? Stroke Father   ? Diabetes Maternal Grandfather   ? Colon cancer Neg Hx   ? Throat cancer Neg Hx   ? Prostate cancer Neg Hx   ? Pancreatic cancer Neg Hx   ? Heart disease Neg Hx   ? Kidney disease Neg Hx   ? Liver disease Neg Hx   ? ? ?Social History:  reports that he has never smoked. He has never used smokeless tobacco. He reports that he does not drink alcohol and does not use drugs. ? ?  ?Review of Systems  ? ? ?  HYPERTENSION: Taking 100 mg losartan ?Also on Jardiance ?He will check blood pressure at work periodically: His blood pressure usually runs about 120/70-80 ?However last night he felt lightheaded and he says his blood pressure was 70 systolic but after eating some food it did come up to 100 ?He was still little lightheaded this morning when he came in but not now ? ?BP Readings from Last 3 Encounters:  ?03/31/22 105/80  ?01/28/22 124/78  ?01/06/22 132/84  ? ?Renal function as follows: ? ?Lab Results  ?Component Value Date  ? CREATININE 1.37 03/17/2022  ? CREATININE 1.35 01/22/2022  ? CREATININE 1.00 01/12/2022  ? ? ? ?   Hyperlipidemia: taking CRESTOR 10 mg ?LDL is now below 100 and triglycerides are still high, previously LDL was higher with  pravastatin ? ? ?Lab Results  ?Component Value Date  ? CHOL 131 01/22/2022  ? HDL 40.90 01/22/2022  ? [place] 63 04/15/2021  ? LDLDIRECT 69.0 01/22/2022  ? TRIG 201.0 (H) 01/22/2022  ? CHOLHDL 3 01/22/2022  ? ?  ?Lab R

## 2022-04-03 ENCOUNTER — Encounter: Payer: Self-pay | Admitting: Endocrinology

## 2022-04-03 DIAGNOSIS — E1165 Type 2 diabetes mellitus with hyperglycemia: Secondary | ICD-10-CM

## 2022-04-06 ENCOUNTER — Encounter: Payer: Self-pay | Admitting: Endocrinology

## 2022-04-07 ENCOUNTER — Other Ambulatory Visit: Payer: Self-pay | Admitting: Endocrinology

## 2022-04-07 ENCOUNTER — Other Ambulatory Visit: Payer: Self-pay

## 2022-04-07 DIAGNOSIS — I10 Essential (primary) hypertension: Secondary | ICD-10-CM

## 2022-04-07 MED ORDER — TRESIBA FLEXTOUCH 100 UNIT/ML ~~LOC~~ SOPN: PEN_INJECTOR | SUBCUTANEOUS | 2 refills | Status: DC

## 2022-04-13 ENCOUNTER — Other Ambulatory Visit: Payer: Self-pay | Admitting: Endocrinology

## 2022-04-21 DIAGNOSIS — H2513 Age-related nuclear cataract, bilateral: Secondary | ICD-10-CM | POA: Diagnosis not present

## 2022-04-21 DIAGNOSIS — Z794 Long term (current) use of insulin: Secondary | ICD-10-CM | POA: Diagnosis not present

## 2022-04-21 DIAGNOSIS — E1136 Type 2 diabetes mellitus with diabetic cataract: Secondary | ICD-10-CM | POA: Diagnosis not present

## 2022-04-21 DIAGNOSIS — Z7984 Long term (current) use of oral hypoglycemic drugs: Secondary | ICD-10-CM | POA: Diagnosis not present

## 2022-04-21 LAB — HM DIABETES EYE EXAM

## 2022-04-24 ENCOUNTER — Encounter: Payer: Self-pay | Admitting: Endocrinology

## 2022-05-05 ENCOUNTER — Other Ambulatory Visit: Payer: Self-pay | Admitting: Endocrinology

## 2022-05-06 ENCOUNTER — Encounter: Payer: Self-pay | Admitting: Endocrinology

## 2022-05-06 ENCOUNTER — Ambulatory Visit (INDEPENDENT_AMBULATORY_CARE_PROVIDER_SITE_OTHER): Payer: BC Managed Care – PPO | Admitting: Endocrinology

## 2022-05-06 VITALS — BP 122/82 | HR 84 | Ht 72.0 in | Wt 296.0 lb

## 2022-05-06 DIAGNOSIS — E1165 Type 2 diabetes mellitus with hyperglycemia: Secondary | ICD-10-CM | POA: Diagnosis not present

## 2022-05-06 LAB — POCT GLYCOSYLATED HEMOGLOBIN (HGB A1C): Hemoglobin A1C: 8.3 % — AB (ref 4.0–5.6)

## 2022-05-06 MED ORDER — FREESTYLE LIBRE 3 SENSOR MISC: 1.0000 | 2 refills | Status: DC

## 2022-05-06 NOTE — Progress Notes (Signed)
? ? ?Patient ID: Jack Wallace, male   DOB: 03-08-1961, 61 y.o.   MRN: [id number] ? ? ?Reason for Appointment : Followup  ? ?  ?History of Present Illness  ?  ?        ?Diagnosis: Type 2 diabetes mellitus, date of diagnosis: 1993  ?      ? ?Past diabetes history: He has been treated with various drugs for his diabetes over the last several years. He has been taking metformin for at least 10 years and has been tolerating this. Over the years he has had additional medications to improve his control. ?He was also taking Actos previously and not clear if he had any side effects. This was stopped because of fear of long-term effects ? ?In early 2014 he had poor control before he started walking and lost 15 pounds. Subsequently he was getting low blood sugar during the day especially before lunch and sometimes in the afternoon His glipizide was  reduced but even with 5 mg twice a day he was getting hypoglycemia.  ?This was reduced and Invokana added. Invokana was stopped because of lack of clear benefit in 09/2013 ?Because of inadequate control he was started on Victoza on his visit in 10/14 in addition to his metformin and glipizide. ?In 12/2017 he was started on Ozempic instead of Victoza ? ?Recent history:  ? ?Non-insulin hypoglycemic drugs: Metformin Er 1500 mg daily,  Jardiance 25 in pm, Amaryl 1 mg twice daily ?INSULIN regimen: Tresiba 22 units once daily at bedtime ? ?His A1c is 8.3 ? ? ?Current problems, blood sugar patterns and management: ?After his last visit he was started on TRESIBA injection once a day ?He has used a flowsheet provided to increase his dose every 3 days as directed  ?He is now taking 22 units since 04/28/2022  ?He is checking blood sugars mostly when he wakes up ?Although previously his blood sugars had been as high as 292 they have gradually improved and now as low as 102 waking up ?Only sporadic will have a high reading of 200 or 300 in the morning but based on what he has eaten  previously ?He says he sometimes feels hypoglycemic when he comes back from work in the morning and recently lowest documented blood sugar is 75 ?On his own he has changed his glimepiride to 1 mg twice a day instead of 2 mg at bedtime ?He tends to be more hungry now and is up 10 pounds on his weight ?He has not started any kind of exercise regimen ? ?Meals at work: 1-3 am, at home 10-11 am or 3 PM ? ?Monitors blood glucose:  1 times a day        Glucometer:  Contour ? ?Blood Glucose readings from meter review ? ? ?PRE-MEAL Fasting Lunch Dinner Bedtime Overall  ?Glucose range: 102-303   75   ?Mean/median:       ? ?POST-MEAL PC Breakfast PC Lunch PC Dinner  ?Glucose range:   ?  ?Mean/median:     ? ?Prior ? ?PRE-MEAL Fasting Lunch Dinner Bedtime Overall  ?Glucose range: 142-233 132-292  84-246   ?Mean/median: 366 4 4034  742 595  ? ? ? ?Dietician visit: Most recent: 5/19 ? ?Wt Readings from Last 3 Encounters:  ?05/06/22 296 lb (134.3 kg)  ?03/31/22 285 lb (129.3 kg)  ?01/28/22 294 lb 12.8 oz (133.7 kg)  ? ?   ?Lab Results  ?Component Value Date  ? HGBA1C 8.3 (A) 05/06/2022  ?  HGBA1C 8.0 (H) 01/22/2022  ? HGBA1C 7.0 (H) 10/16/2021  ? ?Lab Results  ?Component Value Date  ? MICROALBUR 1.1 01/22/2022  ? [place] 63 04/15/2021  ? CREATININE 1.37 03/17/2022  ? ?Lab Results  ?Component Value Date  ? FRUCTOSAMINE 315 (H) 03/17/2022  ? FRUCTOSAMINE 294 (H) 02/24/2018  ? FRUCTOSAMINE 314 (H) 11/25/2017  ? ? ?OTHER active problems: See review of systems ? ? ?Allergies as of 05/06/2022   ?No Known Allergies ?  ? ?  ?Medication List  ?  ? ?  ? Accurate as of May 06, 2022  8:55 PM. If you have any questions, ask your nurse or doctor.  ?  ?  ? ?  ? ?albuterol 108 (90 Base) MCG/ACT inhaler ?Commonly known as: VENTOLIN HFA ?Inhale 2 puffs into the lungs every 6 (six) hours as needed for wheezing or shortness of breath. ?  ?Alpha Lipoic Acid 200 MG Caps ?Take 200 mg by mouth. 2 pills daily ?  ?aspirin 81 MG tablet ?Take 81 mg by mouth  daily. ?  ?Haematologist lancets ?Use to check blood sugar two times per day ?  ?CONTOUR NEXT EZ MONITOR w/Device Kit ?Use to check blood sugar two times per day ?  ?Contour Next Test test strip ?Generic drug: glucose blood ?TEST TWICE DAILY ?  ?diclofenac 75 MG EC tablet ?Commonly known as: VOLTAREN ?TAKE 1 TABLET BY MOUTH TWICE DAILY WITH FOOD ?  ?fluticasone 50 MCG/ACT nasal spray ?Commonly known as: FLONASE ?Place 2 sprays into both nostrils daily. ?  ?FreeStyle Libre 3 Sensor Misc ?1 Device by Does not apply route every 14 (fourteen) days. Apply 1 sensor on upper arm every 14 days for continuous glucose monitoring ?Started by: Elayne Snare, MD ?  ?gabapentin 600 MG tablet ?Commonly known as: NEURONTIN ?TAKE 1 TABLET(600 MG) BY MOUTH THREE TIMES DAILY ?  ?glimepiride 1 MG tablet ?Commonly known as: AMARYL ?Take 1 tablet before lunch and bedtime ?  ?Insulin Pen Needle 31G X 5 MM Misc ?Use daily for insulin pen ?  ?Jardiance 25 MG Tabs tablet ?Generic drug: empagliflozin ?TAKE 1 TABLET BY MOUTH EVERY DAY ?  ?Lokelma 10 g Pack packet ?Generic drug: sodium zirconium cyclosilicate ?Take 10 g by mouth daily. ?  ?losartan 100 MG tablet ?Commonly known as: COZAAR ?TAKE 1 TABLET(100 MG) BY MOUTH DAILY ?  ?magnesium oxide 400 (240 Mg) MG tablet ?Commonly known as: MAG-OX ?Take 1 tablet by mouth 3 (three) times daily. Take 1 in the morning and 2 in the evening. ?  ?metFORMIN 500 MG 24 hr tablet ?Commonly known as: GLUCOPHAGE-XR ?TAKE 4 TABLETS BY MOUTH EVERY DAY WITH SUPPER ?  ?multivitamin with minerals Tabs tablet ?Take 1 tablet by mouth daily. ?  ?ondansetron 4 MG tablet ?Commonly known as: Zofran ?Take 1 tablet (4 mg total) by mouth 2 (two) times daily as needed for nausea or vomiting. ?  ?pantoprazole 40 MG tablet ?Commonly known as: PROTONIX ?TAKE 1 TABLET(40 MG) BY MOUTH TWICE DAILY ?  ?rosuvastatin 10 MG tablet ?Commonly known as: Crestor ?Take 1 tablet (10 mg total) by mouth daily. ?  ?sertraline 100 MG  tablet ?Commonly known as: ZOLOFT ?Take 2 tablets (200 mg total) by mouth daily. ?  ?Tyler Aas FlexTouch 100 UNIT/ML FlexTouch Pen ?Generic drug: insulin degludec ?Inject up to 20 units daily ?  ?vitamin A 25000 UNIT capsule ?Take 25,000 Units by mouth daily. ?  ?vitamin B-12 500 MCG tablet ?Commonly known as: CYANOCOBALAMIN ?Take 500 mcg by  mouth daily. ?  ?vitamin C 1000 MG tablet ?Take 1,000 mg by mouth daily. ?  ?VITAMIN D PO ?Take 1 tablet by mouth daily. D3 1000 IU ?  ?VITAMIN E PO ?Take 1 capsule by mouth daily. 400 IU daily ?  ? ?  ? ? ?Allergies: No Known Allergies ? ?Past Medical History:  ?Diagnosis Date  ? Depression   ? Diabetes mellitus without complication ([place])   ? Hyperlipidemia   ? Hypertension   ? Ischemic colitis ([place])   ? ? ?Past Surgical History:  ?Procedure Laterality Date  ? COLONOSCOPY  06/01/2014  ? NASAL SEPTUM SURGERY    ? ? ?Family History  ?Problem Relation Age of Onset  ? Hypertension Mother   ? Stroke Father   ? Diabetes Maternal Grandfather   ? Colon cancer Neg Hx   ? Throat cancer Neg Hx   ? Prostate cancer Neg Hx   ? Pancreatic cancer Neg Hx   ? Heart disease Neg Hx   ? Kidney disease Neg Hx   ? Liver disease Neg Hx   ? ? ?Social History:  reports that he has never smoked. He has never used smokeless tobacco. He reports that he does not drink alcohol and does not use drugs. ? ?  ?Review of Systems  ? ? ?  HYPERTENSION: Taking 100 mg losartan ?Also on Jardiance ?He will check blood pressure at work periodically: His blood pressure usually runs about 120/70-80 ?However last night he felt lightheaded and he says his blood pressure was 70 systolic but after eating some food it did come up to 100 ?He was still little lightheaded this morning when he came in but not now ? ?BP Readings from Last 3 Encounters:  ?05/06/22 122/82  ?03/31/22 105/80  ?01/28/22 124/78  ? ?Renal function as follows: ? ?Lab Results  ?Component Value Date  ? CREATININE 1.37 03/17/2022  ? CREATININE 1.35 01/22/2022  ?  CREATININE 1.00 01/12/2022  ? ? ? ?   Hyperlipidemia: taking CRESTOR 10 mg ?LDL is now below 100 and triglycerides are still high, previously LDL was higher with pravastatin ? ? ?Lab Results  ?Component Value Date

## 2022-05-06 NOTE — Patient Instructions (Addendum)
Libre 3 app for sensor ? ?Leave off Glimeride in pm ? ?Tresiba 22 U ? ?Exercise  ? ?Check blood sugars on waking up 5-6 days a week ? ?Also check blood sugars about 2 hours after meals and do this after different meals by rotation ? ?Recommended blood sugar levels on waking up are 90-130 and about 2 hours after meal is 130-160 ? ?Please bring your blood sugar monitor to each visit, thank you ? ?

## 2022-06-01 ENCOUNTER — Other Ambulatory Visit: Payer: Self-pay | Admitting: Endocrinology

## 2022-06-15 ENCOUNTER — Encounter: Payer: Self-pay | Admitting: *Deleted

## 2022-06-15 ENCOUNTER — Telehealth: Payer: Self-pay | Admitting: *Deleted

## 2022-06-15 NOTE — Telephone Encounter (Signed)
Call from patient states has been feeling tired for a couple months.  Legs ache most of the time/  No color change noted.   Just has not been feeling well for a few months.  Offered appointment for this afternoon,  Refused stating that he works nights.  Patient requested the first eary morning appointment.  Patient scheduled for an appointment 06/25/2022 at 8:45 AM.

## 2022-06-16 ENCOUNTER — Other Ambulatory Visit: Payer: Self-pay | Admitting: Endocrinology

## 2022-06-16 DIAGNOSIS — E7800 Pure hypercholesterolemia, unspecified: Secondary | ICD-10-CM

## 2022-07-03 ENCOUNTER — Telehealth: Payer: Self-pay

## 2022-07-03 ENCOUNTER — Ambulatory Visit: Payer: BC Managed Care – PPO

## 2022-07-03 DIAGNOSIS — M79605 Pain in left leg: Secondary | ICD-10-CM | POA: Diagnosis not present

## 2022-07-03 DIAGNOSIS — E1159 Type 2 diabetes mellitus with other circulatory complications: Secondary | ICD-10-CM

## 2022-07-03 DIAGNOSIS — I152 Hypertension secondary to endocrine disorders: Secondary | ICD-10-CM

## 2022-07-03 DIAGNOSIS — M79604 Pain in right leg: Secondary | ICD-10-CM | POA: Diagnosis not present

## 2022-07-03 DIAGNOSIS — Z794 Long term (current) use of insulin: Secondary | ICD-10-CM

## 2022-07-03 NOTE — Assessment & Plan Note (Signed)
Patient reports bilateral cramping calf pain for 8-9 months that is caused by activity and improved with rest. This pain does not occur outside of physical activity. It feels different then the burning neuropathic pain in his feet. On exam there is no erythema, no swelling, no ptp, no shininess of the skin, there is no hair (always this way), both LE are warm andPT and DP pulses are 2+ bilaterally. ABIs in clinic were 1.09 and 1.10 for the left and right respectively. Given normal exam and normal ABIs, this is not congruent with claudication from PAD. Common causes of distal cramping include iron deficiency and vitamin D deficiency, but recent labs did not demonstrate microcytic anemia and last vit d was normal with patient taking supplements. Do not suspect neuropathy as this is not burning and is activity related, in addition patient had elevated B12 recently and not deficiency. Do not suspect this is a myopathy from a statin given that the patient is having distal cramping and has been on a stable regimen for a while. The etiology is unclear at this time but the patient exam was reassuring. Will have patient follow up in 4-6 weeks if pain is not improved.  ADDENDUM: Will have patient return in 1 week for a lab draw to check electrolytes, blood counts and iron levels to ensure these common treatable causes are not the reason for distal leg cramping.

## 2022-07-03 NOTE — Addendum Note (Signed)
Addended by: Lalla Brothers T on: 07/03/2022 03:27 PM   Modules accepted: Level of Service

## 2022-07-03 NOTE — Patient Instructions (Signed)
Thank you, [name] for allowing [place] to provide your care today. Today we discussed:  Leg pain- You are having crampy leg pain that is worse with activity, which makes [place] think of a disease called peripheral arterial disease. We tested for this on our physical exam and by measuring the blood pressure in your legs and everything was reassuring and points to you not having this. There was nothing about your physical exam that suggested any dangerous cause of pain and this does not seem to be related to vitamin levels or medications. Please keep track of the symptoms and what makes it better and worse. Follow up with [place] in 4-6 weeks if not better, but this sort of pain should resolve.      Follow up:  4-6 weeks      We look forward to seeing you next time. Please call our clinic at [phone] if you have any questions or concerns. The best time to call is Monday-Friday from 9am-4pm, but there is someone available 24/7. If after hours or the weekend, call the main hospital number and ask for the Internal Medicine Resident On-Call. If you need medication refills, please notify your pharmacy one week in advance and they will send [place] a request.   Thank you for trusting me with your care. Wishing you the best!   Iona Coach, MD [place]

## 2022-07-03 NOTE — Progress Notes (Deleted)
T2DM w neuropathy: Managed by dr Dwyane Dee endocrinology. Retinal exam 04/2022. Microalbumin normal 01/2022. A1c 8.3 04/2022. -22u degladec daily --Continue Metformin XR 2000 QD --Continue Jardiance 25 mg QD - glimeperide 1 mg BID, hold pm dose -Ozempic 0.5 mg weekly (stopped) -gabapentin '600mg'$  TID -f/u in 3 months  HTN -losartan 100 mg BID  HLD: Pravastatin 80 mg   GERD: N/V and considered drug holiday 12/2021 but didnt at that time. -pantoprazole 40 mg daily  MDD: -zoloft 100 mg  Chronic nausea: HPI summary: 3-4 year history of progressive nausea, acutely worse over the past 2 months. See HPI for details. Assessment: suspect this to be medication related, in particular, ozempic. He was previously on victoza and was transitioned to ozempic around 2019, which would fit with the timeline of his symptoms. He did hold the ozempic for a week and did not experience change in symptoms however often times, it takes a greater period of time before patients experience improvement. Additionally reports taking voltaren '75mg'$  daily for left ankle pain although I do not see that this has been refilled in a few years. Alternative offending medications include glimepiride and metformin, although he has been on these for many years without issue.  No red flag symptoms indicating urgent need for GI symptoms. Symptoms seem incongruent with gastroparesis. His hemoglobin did drop ~2 grams on labs from 10/2021 so gastric ulcer could be considered, although less likely in the absence of pain.  Plan: Will have him hold Ozempic until he follows up with Dr. Dwyane Dee in February. I have advised him to notify our office if symptoms have not changed by that time, at which time I will place a referral to GI. -ondansetron 4 mg BID  Gastroenterology 2018: 1. Suspected IBS-C. Miralax 1-3 times per day with dose adjusted for a complete BM each day without straining. Dicyclomine 10 mg po tid prn. Trial of Linzess or Trulance if  symptoms not well controlled. REV in 4 weeks.    2. GERD. Epigastric pain likely related to GERD or IBS. Continue pantoprazole 40 mg daily and antireflux measures. Consider EGD if symptoms persist. REV in 4 weeks.  Colonoscopy 2022 with evidence of ischemic colitis  NM gastric emptying study ordered by endocrinology 2/8 but never done.

## 2022-07-03 NOTE — Telephone Encounter (Signed)
Called patient to schedule lab appointment next week for leg pain. Did not get an answer, but left a voicemail instructing patient to call back Monday. Will send message to front desk to schedule patient and will place future order for labs.

## 2022-07-03 NOTE — Progress Notes (Signed)
Internal Medicine Clinic Attending  I saw and evaluated the patient.  I personally confirmed the key portions of the history and exam documented by Dr. Stann Mainland and I reviewed pertinent patient test results.  The assessment, diagnosis, and plan were formulated together and I agree with the documentation in the resident's note.   Bilateral calf cramping with ambulation with unclear etiology. ABI in the clinic are normal in both legs with normal wave forms making this low risk for vascular claudication. Will check iron studies to rule out iron deficiency. Could consider neuropathy or neurogenic claudication in the future if this persists.

## 2022-07-03 NOTE — Addendum Note (Signed)
Addended by: Iona Coach on: 07/03/2022 02:59 PM   Modules accepted: Orders

## 2022-07-03 NOTE — Assessment & Plan Note (Signed)
Blood pressure today was 130/72, around goal of <130/80. -Continue Losartan 100 mg daily

## 2022-07-03 NOTE — Progress Notes (Signed)
Established Patient Office Visit  Subjective   Patient ID: Jack Wallace, male    DOB: May 11, 1961  Age: 61 y.o. MRN: [id number]  Chief Complaint  Patient presents with   Leg Pain    Jack Wallace is a 61 y/o male with a pmh outlined below who presents for 8-9 months of leg pain. He describes the pain as campy-burning that occurs with onset of activity and is felt in the bilateral calves. The pain is constant throughout activity, relieved by rest, and different than the burning neuropathic pain he feels in his feet. He says his leg feels af if its going to give out at times. This is not debilitating pain but is very uncomfortable given that he works long shifts on concrete and with a heavy walking burden.  Leg Pain       Review of Systems  Constitutional:  Negative for weight loss.  Respiratory:  Negative for cough, sputum production, shortness of breath and wheezing.   Cardiovascular:  Positive for claudication. Negative for chest pain, palpitations, orthopnea, leg swelling and PND.  Gastrointestinal:  Positive for constipation, heartburn and nausea. Negative for abdominal pain, diarrhea and vomiting.      Objective:     BP 130/72 (BP Location: Right Arm, Patient Position: Sitting, Cuff Size: Normal)   Pulse 94   Temp 98.2 F (36.8 C) (Oral)   Ht 6' (1.829 m)   Wt (!) 305 lb 9.6 oz (138.6 kg)   SpO2 95%   BMI 41.45 kg/m    Physical Exam Constitutional:      General: He is not in acute distress.    Appearance: He is obese. He is not ill-appearing.  Cardiovascular:     Rate and Rhythm: Normal rate and regular rhythm.     Heart sounds: Normal heart sounds. No murmur heard.    Comments: PT and DP pulses 2+ bilaterally Pulmonary:     Effort: Pulmonary effort is normal. No respiratory distress.     Breath sounds: Normal breath sounds. No wheezing or rales.  Abdominal:     General: Bowel sounds are normal.     Tenderness: There is no abdominal tenderness.   Musculoskeletal:     Right lower leg: No edema.     Left lower leg: No edema.     Comments: No LE erythema, swelling or ptp   Skin:    General: Skin is warm and dry.     Capillary Refill: Capillary refill takes less than 2 seconds.  Neurological:     Mental Status: He is alert.      No results found for any visits on 07/03/22.    The 10-year ASCVD risk score (Arnett DK, et al., 2019) is: 16.5%    Assessment & Plan:   Problem List Items Addressed This Visit       Cardiovascular and Mediastinum   Hypertension associated with diabetes ([place]) (Chronic)    Blood pressure today was 130/72, around goal of <130/80. -Continue Losartan 100 mg daily        Other   Bilateral leg pain    Patient reports bilateral cramping calf pain for 8-9 months that is caused by activity and improved with rest. This pain does not occur outside of physical activity. It feels different then the burning neuropathic pain in his feet. On exam there is no erythema, no swelling, no ptp, no shininess of the skin, there is no hair (always this way), both LE are warm andPT and DP  pulses are 2+ bilaterally. ABIs in clinic were 1.09 and 1.10 for the left and right respectively. Given normal exam and normal ABIs, this is not congruent with claudication from PAD. Common causes of distal cramping include iron deficiency and vitamin D deficiency, but recent labs did not demonstrate microcytic anemia and last vit d was normal with patient taking supplements. Do not suspect neuropathy as this is not burning and is activity related, in addition patient had elevated B12 recently and not deficiency. Do not suspect this is a myopathy from a statin given that the patient is having distal cramping and has been on a stable regimen for a while. The etiology is unclear at this time but the patient exam was reassuring. Will have patient follow up in 4-6 weeks if pain is not improved.       No follow-ups on file.    Iona Coach, MD

## 2022-07-06 ENCOUNTER — Telehealth: Payer: Self-pay | Admitting: *Deleted

## 2022-07-06 NOTE — Addendum Note (Signed)
Addended by: Iona Coach on: 07/06/2022 02:49 PM   Modules accepted: Orders

## 2022-07-06 NOTE — Telephone Encounter (Signed)
RTC to patient informed him of need to come in for labs to do further work up for his leg pain per Dr. Stann Mainland.    Patient to come in 07/07/2022 for lab only appointment.

## 2022-07-06 NOTE — Addendum Note (Signed)
Addended by: Truddie Crumble on: 07/06/2022 11:49 AM   Modules accepted: Orders

## 2022-07-07 ENCOUNTER — Other Ambulatory Visit (INDEPENDENT_AMBULATORY_CARE_PROVIDER_SITE_OTHER): Payer: BC Managed Care – PPO

## 2022-07-07 DIAGNOSIS — M79605 Pain in left leg: Secondary | ICD-10-CM | POA: Diagnosis not present

## 2022-07-07 DIAGNOSIS — M79604 Pain in right leg: Secondary | ICD-10-CM | POA: Diagnosis not present

## 2022-07-08 ENCOUNTER — Telehealth: Payer: Self-pay

## 2022-07-08 LAB — BMP8+ANION GAP
Anion Gap: 16 mmol/L (ref 10.0–18.0)
BUN/Creatinine Ratio: 15 (ref 10–24)
BUN: 17 mg/dL (ref 8–27)
CO2: 22 mmol/L (ref 20–29)
Calcium: 10.2 mg/dL (ref 8.6–10.2)
Chloride: 104 mmol/L (ref 96–106)
Creatinine, Ser: 1.16 mg/dL (ref 0.76–1.27)
Glucose: 126 mg/dL — ABNORMAL HIGH (ref 70–99)
Potassium: 5.1 mmol/L (ref 3.5–5.2)
Sodium: 142 mmol/L (ref 134–144)
eGFR: 72 mL/min/{1.73_m2} (ref >59)

## 2022-07-08 LAB — CBC
Hematocrit: 39.9 % (ref 37.5–51.0)
Hemoglobin: 12.3 g/dL — ABNORMAL LOW (ref 13.0–17.7)
MCH: 24.5 pg — ABNORMAL LOW (ref 26.6–33.0)
MCHC: 30.8 g/dL — ABNORMAL LOW (ref 31.5–35.7)
MCV: 79 fL (ref 79–97)
Platelets: 295 10*3/uL (ref 150–450)
RBC: 5.03 x10E6/uL (ref 4.14–5.80)
RDW: 15.4 % (ref 11.6–15.4)
WBC: 7.7 10*3/uL (ref 3.4–10.8)

## 2022-07-08 LAB — IRON AND TIBC
Iron Saturation: 14 % — ABNORMAL LOW (ref 15–55)
Iron: 61 ug/dL (ref 38–169)
Total Iron Binding Capacity: 427 ug/dL (ref 250–450)
UIBC: 366 ug/dL — ABNORMAL HIGH (ref 111–343)

## 2022-07-08 NOTE — Telephone Encounter (Signed)
Left voicemail for patient to call back and discuss lab results.

## 2022-07-08 NOTE — Telephone Encounter (Signed)
Discussed normal electrolytes and kidney function on BMP. Told patient that iron is normal with slightly low iron saturation, which may be normal for him. Discussed that this mild decrease in saturation is not likely the cause of bilateral calf pain/cramping. He has follow up scheduled to further work up his leg pain.

## 2022-07-13 ENCOUNTER — Other Ambulatory Visit: Payer: Self-pay | Admitting: Endocrinology

## 2022-07-13 DIAGNOSIS — E1165 Type 2 diabetes mellitus with hyperglycemia: Secondary | ICD-10-CM

## 2022-07-27 ENCOUNTER — Ambulatory Visit: Payer: Self-pay | Admitting: Licensed Clinical Social Worker

## 2022-07-27 NOTE — Patient Outreach (Signed)
[facility]) Care Management  [facility] Social Work  07/27/2022  Jack Wallace [birth year] [id number]  Subjective:  Chart Review. Patient scheduled for 08/08  Objective: SW reviewed chart in encounter. SW reviewed medical history , SDOH screenings   Encounter Medications:  Outpatient Encounter Medications as of 07/27/2022  Medication Sig   albuterol (VENTOLIN HFA) 108 (90 Base) MCG/ACT inhaler Inhale 2 puffs into the lungs every 6 (six) hours as needed for wheezing or shortness of breath.   Alpha Lipoic Acid 200 MG CAPS Take 200 mg by mouth. 2 pills daily   Ascorbic Acid (VITAMIN C) 1000 MG tablet Take 1,000 mg by mouth daily.   aspirin 81 MG tablet Take 81 mg by mouth daily.   BAYER MICROLET LANCETS lancets Use to check blood sugar two times per day   Beta Carotene (VITAMIN A) 25000 UNIT capsule Take 25,000 Units by mouth daily.   Blood Glucose Monitoring Suppl (CONTOUR NEXT EZ MONITOR) w/Device KIT Use to check blood sugar two times per day   Cholecalciferol (VITAMIN D PO) Take 1 tablet by mouth daily. D3 1000 IU   Continuous Blood Gluc Sensor (FREESTYLE LIBRE 3 SENSOR) MISC 1 Device by Does not apply route every 14 (fourteen) days. Apply 1 sensor on upper arm every 14 days for continuous glucose monitoring   CONTOUR NEXT TEST test strip TEST TWICE DAILY   diclofenac (VOLTAREN) 75 MG EC tablet TAKE 1 TABLET BY MOUTH TWICE DAILY WITH FOOD (Patient not taking: Reported on 05/06/2022)   fluticasone (FLONASE) 50 MCG/ACT nasal spray Place 2 sprays into both nostrils daily.   gabapentin (NEURONTIN) 600 MG tablet TAKE 1 TABLET(600 MG) BY MOUTH THREE TIMES DAILY   glimepiride (AMARYL) 1 MG tablet Take 1 tablet before lunch and bedtime   insulin degludec (TRESIBA FLEXTOUCH) 100 UNIT/ML FlexTouch Pen ADMINISTER UP TO 20 UNITS UNDER THE SKIN DAILY   Insulin Pen Needle 31G X 5 MM MISC Use daily for insulin pen   JARDIANCE 25 MG TABS tablet TAKE 1 TABLET BY MOUTH EVERY DAY   losartan  (COZAAR) 100 MG tablet TAKE 1 TABLET(100 MG) BY MOUTH DAILY   Magnesium Oxide 400 (240 MG) MG TABS Take 1 tablet by mouth 3 (three) times daily. Take 1 in the morning and 2 in the evening.   metFORMIN (GLUCOPHAGE-XR) 500 MG 24 hr tablet TAKE 4 TABLETS BY MOUTH EVERY DAY WITH SUPPER   Multiple Vitamin (MULTIVITAMIN WITH MINERALS) TABS tablet Take 1 tablet by mouth daily.   ondansetron (ZOFRAN) 4 MG tablet Take 1 tablet (4 mg total) by mouth 2 (two) times daily as needed for nausea or vomiting.   pantoprazole (PROTONIX) 40 MG tablet TAKE 1 TABLET(40 MG) BY MOUTH TWICE DAILY   rosuvastatin (CRESTOR) 10 MG tablet TAKE 1 TABLET(10 MG) BY MOUTH DAILY   sertraline (ZOLOFT) 100 MG tablet Take 2 tablets (200 mg total) by mouth daily.   sodium zirconium cyclosilicate (LOKELMA) 10 g PACK packet Take 10 g by mouth daily. (Patient not taking: Reported on 05/06/2022)   vitamin B-12 (CYANOCOBALAMIN) 500 MCG tablet Take 500 mcg by mouth daily.   VITAMIN E PO Take 1 capsule by mouth daily. 400 IU daily   No facility-administered encounter medications on file as of 07/27/2022.    Functional Status:     07/03/2022    8:50 AM 01/06/2022   10:48 AM  In your present state of health, do you have any difficulty performing the following activities:  Hearing? 1 1  Vision? 0 0  Difficulty concentrating or making decisions? 0 1  Walking or climbing stairs? 0 0  Dressing or bathing? 0 0  Doing errands, shopping? 0 0    Fall/Depression Screening:     07/03/2022   10:21 AM 01/06/2022   11:46 AM 09/09/2021    3:53 PM 07/03/2021   10:15 AM 07/12/2020    9:23 AM 02/22/2019    1:44 PM 01/17/2019   10:01 AM  PHQ 2/9 Scores  PHQ - 2 Score 3 3 2 2 3 2 4   PHQ- 9 Score 12 12 11 9 8 7 10     Assessment:  Care Plan There are no care plans that you recently modified to display for this patient.    Goals Addressed   None     Plan:  Follow-up:  Follow-up in 7 day(s)

## 2022-07-27 NOTE — Patient Outreach (Signed)
  Care Coordination   07/27/2022 Name: Jack Wallace MRN: [id number] DOB: 1961/06/12   Care Coordination Outreach Attempts:  An unsuccessful telephone outreach was attempted today to offer the patient information about available care coordination services as a benefit of their health plan.   Follow Up Plan:  Additional outreach attempts will be made to offer the patient care coordination information and services.   Encounter Outcome:  Pt. Scheduled  Care Coordination Interventions Activated:  Yes   Care Coordination Interventions:  No, not indicated    Lenor Derrick , MSW Social Worker [facility] Care Management  [phone]

## 2022-07-30 ENCOUNTER — Other Ambulatory Visit (INDEPENDENT_AMBULATORY_CARE_PROVIDER_SITE_OTHER): Payer: BC Managed Care – PPO

## 2022-07-30 ENCOUNTER — Other Ambulatory Visit: Payer: Self-pay | Admitting: Endocrinology

## 2022-07-30 DIAGNOSIS — E1165 Type 2 diabetes mellitus with hyperglycemia: Secondary | ICD-10-CM | POA: Diagnosis not present

## 2022-07-30 LAB — BASIC METABOLIC PANEL
BUN: 30 mg/dL — ABNORMAL HIGH (ref 6–23)
CO2: 27 mEq/L (ref 19–32)
Calcium: 10.4 mg/dL (ref 8.4–10.5)
Chloride: 99 mEq/L (ref 96–112)
Creatinine, Ser: 2.11 mg/dL — ABNORMAL HIGH (ref 0.40–1.50)
GFR: 33.07 mL/min — ABNORMAL LOW (ref >60.00)
Glucose, Bld: 161 mg/dL — ABNORMAL HIGH (ref 70–99)
Potassium: 5.1 mEq/L (ref 3.5–5.1)
Sodium: 138 mEq/L (ref 135–145)

## 2022-07-30 LAB — HEMOGLOBIN A1C: Hgb A1c MFr Bld: 8.1 % — ABNORMAL HIGH (ref 4.6–6.5)

## 2022-07-30 LAB — LIPID PANEL
Cholesterol: 151 mg/dL (ref 0–200)
HDL: 41.6 mg/dL (ref >39.00)
NonHDL: 109.52
Total CHOL/HDL Ratio: 4
Triglycerides: 237 mg/dL — ABNORMAL HIGH (ref 0.0–149.0)
VLDL: 47.4 mg/dL — ABNORMAL HIGH (ref 0.0–40.0)

## 2022-07-30 LAB — LDL CHOLESTEROL, DIRECT: Direct LDL: 85 mg/dL

## 2022-08-05 ENCOUNTER — Ambulatory Visit: Payer: BC Managed Care – PPO | Admitting: Endocrinology

## 2022-08-05 ENCOUNTER — Encounter: Payer: Self-pay | Admitting: Endocrinology

## 2022-08-05 VITALS — BP 130/88 | HR 89 | Ht 72.0 in | Wt 305.6 lb

## 2022-08-05 DIAGNOSIS — E1165 Type 2 diabetes mellitus with hyperglycemia: Secondary | ICD-10-CM

## 2022-08-05 DIAGNOSIS — N289 Disorder of kidney and ureter, unspecified: Secondary | ICD-10-CM

## 2022-08-05 DIAGNOSIS — Z794 Long term (current) use of insulin: Secondary | ICD-10-CM | POA: Diagnosis not present

## 2022-08-05 LAB — CREATININE, SERUM: Creatinine, Ser: 1.28 mg/dL (ref 0.40–1.50)

## 2022-08-05 NOTE — Patient Instructions (Signed)
  Check blood sugars on waking up days a week  Also check blood sugars about 2 hours after meals and do this after different meals by rotation  Recommended blood sugar levels on waking up are 90-130 and about 2 hours after meal is 130-180  Please bring your blood sugar monitor to each visit, thank you   

## 2022-08-05 NOTE — Progress Notes (Signed)
Patient ID: Jack Wallace, male   DOB: 06-30-1961, 61 y.o.   MRN: [id number]   Reason for Appointment : Followup     History of Present Illness            Diagnosis: Type 2 diabetes mellitus, date of diagnosis: 1993         Past diabetes history: He has been treated with various drugs for his diabetes over the last several years. He has been taking metformin for at least 10 years and has been tolerating this. Over the years he has had additional medications to improve his control. He was also taking Actos previously and not clear if he had any side effects. This was stopped because of fear of long-term effects  In early 2014 he had poor control before he started walking and lost 15 pounds. Subsequently he was getting low blood sugar during the day especially before lunch and sometimes in the afternoon His glipizide was  reduced but even with 5 mg twice a day he was getting hypoglycemia.  This was reduced and Invokana added. Invokana was stopped because of lack of clear benefit in 09/2013 Because of inadequate control he was started on Victoza on his visit in 10/14 in addition to his metformin and glipizide. In 12/2017 he was started on Ozempic instead of Victoza  Recent history:   Non-insulin hypoglycemic drugs: Metformin Er 1500 mg daily,  Jardiance 25 in pm, Amaryl 1 mg twice daily INSULIN regimen: Tresiba 22 units once daily at bedtime  His A1c is 8.1 compared to 8.3   Current problems, blood sugar patterns and management: He has continued the same dose of TRESIBA once a day He has not increase the dose because he says his fasting readings are not consistent He is checking blood sugars very infrequently but recently unclear which readings are fasting and may not be checking them when he wakes up recently  He is checking blood sugars mostly about 2 hours after his main meal at 9 AM Also he thinks he does not eat other meals including at lunch at work when he feels too hot   His weight has gone up since last visit  For various reasons he is not able to exercise including leg pain  Previously had stopped Ozempic because of nausea but he thinks he has only nausea when he takes his OTC medications and supplements in the mornings  Meals at work: 1-3 am, at home 9 am or 3 PM  Monitors blood glucose:  1 times a day        Glucometer:  Contour  Blood Glucose readings from meter review   PRE-MEAL Fasting Lunch Dinner Bedtime Overall  Glucose range: 258      Mean/median:     154/30 days   POST-MEAL PC Breakfast PC Lunch PC Dinner  Glucose range: 117-257    Mean/median:        Dietician visit: Most recent: 5/19  Wt Readings from Last 3 Encounters:  08/05/22 (!) 305 lb 9.6 oz (138.6 kg)  07/03/22 (!) 305 lb 9.6 oz (138.6 kg)  05/06/22 296 lb (134.3 kg)      Lab Results  Component Value Date   HGBA1C 8.1 (H) 07/30/2022   HGBA1C 8.3 (A) 05/06/2022   HGBA1C 8.0 (H) 01/22/2022   Lab Results  Component Value Date   MICROALBUR 1.1 01/22/2022   LDLCALC 63 04/15/2021   CREATININE 1.28 08/05/2022   Lab Results  Component Value Date  FRUCTOSAMINE 315 (H) 03/17/2022   FRUCTOSAMINE 294 (H) 02/24/2018   FRUCTOSAMINE 314 (H) 11/25/2017    OTHER active problems: See review of systems   Allergies as of 08/05/2022   No Known Allergies      Medication List        Accurate as of August 05, 2022  4:36 PM. If you have any questions, ask your nurse or doctor.          albuterol 108 (90 Base) MCG/ACT inhaler Commonly known as: VENTOLIN HFA Inhale 2 puffs into the lungs every 6 (six) hours as needed for wheezing or shortness of breath.   Alpha Lipoic Acid 200 MG Caps Take 200 mg by mouth. 2 pills daily   aspirin 81 MG tablet Take 81 mg by mouth daily.   Bayer Microlet Lancets lancets Use to check blood sugar two times per day   CONTOUR NEXT EZ MONITOR w/Device Kit Use to check blood sugar two times per day   Contour Next Test test  strip Generic drug: glucose blood TEST TWICE DAILY   diclofenac 75 MG EC tablet Commonly known as: VOLTAREN TAKE 1 TABLET BY MOUTH TWICE DAILY WITH FOOD   fluticasone 50 MCG/ACT nasal spray Commonly known as: FLONASE Place 2 sprays into both nostrils daily.   FreeStyle Libre 3 Sensor Misc 1 Device by Does not apply route every 14 (fourteen) days. Apply 1 sensor on upper arm every 14 days for continuous glucose monitoring   gabapentin 600 MG tablet Commonly known as: NEURONTIN TAKE 1 TABLET(600 MG) BY MOUTH THREE TIMES DAILY   glimepiride 1 MG tablet Commonly known as: AMARYL Take 1 tablet before lunch and bedtime   Insulin Pen Needle 31G X 5 MM Misc Use daily for insulin pen   Jardiance 25 MG Tabs tablet Generic drug: empagliflozin TAKE 1 TABLET BY MOUTH EVERY DAY   Lokelma 10 g Pack packet Generic drug: sodium zirconium cyclosilicate Take 10 g by mouth daily.   losartan 100 MG tablet Commonly known as: COZAAR TAKE 1 TABLET(100 MG) BY MOUTH DAILY   magnesium oxide 400 (240 Mg) MG tablet Commonly known as: MAG-OX Take 1 tablet by mouth 3 (three) times daily. Take 1 in the morning and 2 in the evening.   metFORMIN 500 MG 24 hr tablet Commonly known as: GLUCOPHAGE-XR TAKE 4 TABLETS BY MOUTH EVERY DAY WITH SUPPER   multivitamin with minerals Tabs tablet Take 1 tablet by mouth daily.   ondansetron 4 MG tablet Commonly known as: Zofran Take 1 tablet (4 mg total) by mouth 2 (two) times daily as needed for nausea or vomiting.   pantoprazole 40 MG tablet Commonly known as: PROTONIX TAKE 1 TABLET(40 MG) BY MOUTH TWICE DAILY   rosuvastatin 10 MG tablet Commonly known as: CRESTOR TAKE 1 TABLET(10 MG) BY MOUTH DAILY   sertraline 100 MG tablet Commonly known as: ZOLOFT Take 2 tablets (200 mg total) by mouth daily.   Tyler Aas FlexTouch 100 UNIT/ML FlexTouch Pen Generic drug: insulin degludec ADMINISTER UP TO 20 UNITS UNDER THE SKIN DAILY What changed: additional  instructions   vitamin A 25000 UNIT capsule Take 25,000 Units by mouth daily.   vitamin B-12 500 MCG tablet Commonly known as: CYANOCOBALAMIN Take 500 mcg by mouth daily.   vitamin C 1000 MG tablet Take 1,000 mg by mouth daily.   VITAMIN D PO Take 1 tablet by mouth daily. D3 1000 IU   VITAMIN E PO Take 1 capsule by mouth daily. 400 IU daily  Allergies: No Known Allergies  Past Medical History:  Diagnosis Date   Depression    Diabetes mellitus without complication ([place])    Hyperlipidemia    Hypertension    Ischemic colitis [facility])     Past Surgical History:  Procedure Laterality Date   COLONOSCOPY  06/01/2014   NASAL SEPTUM SURGERY      Family History  Problem Relation Age of Onset   Hypertension Mother    Stroke Father    Diabetes Maternal Grandfather    Colon cancer Neg Hx    Throat cancer Neg Hx    Prostate cancer Neg Hx    Pancreatic cancer Neg Hx    Heart disease Neg Hx    Kidney disease Neg Hx    Liver disease Neg Hx     Social History:  reports that he has never smoked. He has never used smokeless tobacco. He reports that he does not drink alcohol and does not use drugs.    Review of Systems      HYPERTENSION: Taking 100 mg losartan Also on Jardiance He will check blood pressure at work periodically: His blood pressure usually runs about 115 120/60-80 Repeat blood pressure 140/90 standing  BP Readings from Last 3 Encounters:  08/05/22 130/88  07/03/22 130/72  05/06/22 122/82   Renal function as follows: He does not take any ibuprofen or OTC naproxen  Lab Results  Component Value Date   CREATININE 1.28 08/05/2022   CREATININE 2.11 (H) 07/30/2022   CREATININE 1.16 07/07/2022        Hyperlipidemia: taking CRESTOR 10 mg LDL is below 100 and triglycerides are still high, previously LDL was higher with pravastatin   Lab Results  Component Value Date   CHOL 151 07/30/2022   HDL 41.60 07/30/2022   LDLCALC 63 04/15/2021    LDLDIRECT 85.0 07/30/2022   TRIG 237.0 (H) 07/30/2022   CHOLHDL 4 07/30/2022     Lab Results  Component Value Date   ALT 15 01/22/2022     NEUROPATHY He has had symptoms of neuropathy in feet, including burning  He was tried on Lyrica 150 mg but he does not think this works any better than gabapentin He previously was on gabapentin 600 mg 3 capsules a day without adequate control  Had a normal eye exam and was seen by ophthalmologist in 5/23  Foot exam done in 11/22   Physical Examination:  BP 130/88 (BP Location: Left Arm, Patient Position: Sitting, Cuff Size: Normal)   Pulse 89   Ht 6' (1.829 m)   Wt (!) 305 lb 9.6 oz (138.6 kg)   SpO2 96%   BMI 41.45 kg/m     ASSESSMENT/PLAN:   Diabetes type 2 with obesity:  See history of present illness for detailed discussion of current diabetes management, blood sugar patterns and problems identified   A1c is 8.1  Oral medications: Amaryl 1 mg before lunch and bedtime, along with Jardiance/metformin  Insulin: Tresiba 22 units daily  Blood sugars are inconsistent and has not monitored much Not clear what makes his blood sugars fluctuate both fasting and after meals Improving with adding insulin especially in the morning but not clear what his readings after meals and no objective labs available He has gained weight from both blood sugar improvement and increased caloric intake Not exercising  Plan: Continue 22 units of Tresiba We will check to see if the [place] 3 reader is available at the pharmacy Otherwise need to check blood sugars more consistently on  a daily basis at least once a day alternating fasting and after meals  He will stop all his OTC supplements except B12 to see if his nausea will be improved  he can try [facility] again with the 2.5 mg sample that was given previously, again reminded him how to take this and possible side effects as well as benefits Consider adding mealtime insulin at lunchtime if blood  sugars are consistently higher after the meal  RENAL dysfunction: His creatinine is unusually high today and no contributing factors use of NSAIDs  Recheck CREATININE and if still abnormal will need to stop metformin and have him evaluated by his PCP or nephrologist  HYPERLIPIDEMIA: LDL is controlled but triglycerides are significantly high likely to be from recent weight gain, inconsistent diet and hyperglycemia  HYPERTENSION: Blood pressure is higher in the office and further increased on second measurement likely whitecoat syndrome He will continue to monitor at home or work  Patient Instructions  Check blood sugars on waking up days a week  Also check blood sugars about 2 hours after meals and do this after different meals by rotation  Recommended blood sugar levels on waking up are 90-130 and about 2 hours after meal is 130-180  Please bring your blood sugar monitor to each visit, thank you      Total visit time including counseling = 30 minutes  Elayne Snare 08/05/2022, 4:36 PM   Addendum: Creatinine is back to normal at 1.28

## 2022-08-06 ENCOUNTER — Telehealth: Payer: Self-pay

## 2022-08-06 ENCOUNTER — Other Ambulatory Visit: Payer: Self-pay

## 2022-08-06 NOTE — Telephone Encounter (Signed)
Order for [facility] 3 reader has now been submitted to DME supplier due to not being available in pharmacies.

## 2022-08-13 ENCOUNTER — Encounter: Payer: Self-pay | Admitting: Student

## 2022-08-13 ENCOUNTER — Ambulatory Visit: Payer: BC Managed Care – PPO | Admitting: Student

## 2022-08-13 VITALS — BP 142/76 | HR 82 | Wt 306.8 lb

## 2022-08-13 DIAGNOSIS — M79605 Pain in left leg: Secondary | ICD-10-CM

## 2022-08-13 DIAGNOSIS — M79604 Pain in right leg: Secondary | ICD-10-CM | POA: Diagnosis not present

## 2022-08-13 DIAGNOSIS — I152 Hypertension secondary to endocrine disorders: Secondary | ICD-10-CM

## 2022-08-13 DIAGNOSIS — E1122 Type 2 diabetes mellitus with diabetic chronic kidney disease: Secondary | ICD-10-CM

## 2022-08-13 DIAGNOSIS — E1159 Type 2 diabetes mellitus with other circulatory complications: Secondary | ICD-10-CM

## 2022-08-13 DIAGNOSIS — D508 Other iron deficiency anemias: Secondary | ICD-10-CM

## 2022-08-13 DIAGNOSIS — Z794 Long term (current) use of insulin: Secondary | ICD-10-CM

## 2022-08-13 MED ORDER — POLYETHYLENE GLYCOL 3350 17 G PO PACK: 17.0000 g | PACK | Freq: Every day | ORAL | 0 refills | Status: DC

## 2022-08-13 MED ORDER — FERROUS SULFATE 325 (65 FE) MG PO TABS: 325.0000 mg | ORAL_TABLET | ORAL | 11 refills | Status: DC

## 2022-08-13 NOTE — Assessment & Plan Note (Addendum)
Patient returns to clinic with similar, chronic, intermittent bilateral lower extremity pain on calves, lateral lower extremities that intermittently happens with activity and rest, different from neuropathic pain, which for him is relieved with Gabapentin. Exam today is unchanged from prior. Symptom presentation could be in setting of claudication pain but patient with normal ABIs during last visit, and 2+ and symmetrical DP pulses today. Additional work up included Vitamin D and B12, with normal levels. Patient with stable dose of statin; last LDL 89 and increased CV risk factors, thus, not inclined to discontinue therapy. Patient had iron labs during last OV consistent with iron deficiency anemia: mild anemia with increased total iron binding capacity at 427 and low iron saturation. This is likely contributing to lower leg discomfort, so will plan adding iron supplementations 3x/weekly with bowel regimen to prevent constipation. I will also plan to refer patient to physical therapy to aid with exercise tolerance and better characterization of patient's leg discomfort during activity. -Start PO iron supplementation -Bowel regimen  -Referral to physical therapy -Return in 1 month for follow up

## 2022-08-13 NOTE — Assessment & Plan Note (Signed)
BP elevated today to 142/76 without medications this am. Patient is presenting to clinic after having worked 3rd shift. Patient usually takes medication a lot later than OV time. - No changes to medication today

## 2022-08-13 NOTE — Patient Instructions (Addendum)
Thank you, Mr.Huxton A Pease for allowing [place] to provide your care today. Today we discussed   Your leg pain - We are adding an iron supplement ('325mg'$ ) to take on Monday, Wednesday, and Friday. We are also adding Miralax to help with your bowel movements as iron can cause constipation. Drink lots of water and fiber. Calls [place] back if you experience worsening nausea -referring you to physical therapy to help with exercise tolerance and evaluate your lower leg pain  We encourage you to get your shingle vaccine from your local pharmacy.      I will call if any are abnormal. All of your labs can be accessed through "My Chart".   Please make sure to arrive 15 minutes prior to your next appointment. If you arrive late, you may be asked to reschedule.    We look forward to seeing you next time. Please call our clinic at [phone] if you have any questions or concerns. The best time to call is Monday-Friday from 9am-4pm, but there is someone available 24/7. If after hours or the weekend, call the main hospital number and ask for the Internal Medicine Resident On-Call. If you need medication refills, please notify your pharmacy one week in advance and they will send [place] a request.   Thank you for letting us take part in your care. Wishing you the best!  Romana Juniper, MD 08/13/2022, 7:40 AM [facility] Internal Medicine Resident, PGY-1

## 2022-08-13 NOTE — Progress Notes (Signed)
Internal Medicine Clinic Attending  I saw and evaluated the patient.  I personally confirmed the key portions of the history and exam documented by Dr. Altamease Oiler and I reviewed pertinent patient test results.  The assessment, diagnosis, and plan were formulated together and I agree with the documentation in the resident's note.   Cause of patient's chronic intermittent bilateral lower extremity pain in his calves is still not 100% clear. Normal ABIs, vitamin D, and B12. Pain does not seem consistent with statin-induced myopathy, and I think he's benefiting from his statin, so won't change this today. Will start oral iron and also refer to physical therapy today. If pain not improved at 24-monthfollow-up, could consider more of a workup for neurogenic causes of leg pain.

## 2022-08-13 NOTE — Progress Notes (Signed)
Subjective:  CC: follow up  HPI:  [name] is a 61 y.o. male with a past medical history stated below and presents today for blood pressure and lower extremity tiredness/pain. Please see problem based assessment and plan for additional details.  Past Medical History:  Diagnosis Date   Depression    Diabetes mellitus without complication ([place])    Hyperlipidemia    Hypertension    Ischemic colitis ([place])     Current Outpatient Medications on File Prior to Visit  Medication Sig Dispense Refill   albuterol (VENTOLIN HFA) 108 (90 Base) MCG/ACT inhaler Inhale 2 puffs into the lungs every 6 (six) hours as needed for wheezing or shortness of breath. 8 g 0   Alpha Lipoic Acid 200 MG CAPS Take 200 mg by mouth. 2 pills daily     Ascorbic Acid (VITAMIN C) 1000 MG tablet Take 1,000 mg by mouth daily.     aspirin 81 MG tablet Take 81 mg by mouth daily.     BAYER MICROLET LANCETS lancets Use to check blood sugar two times per day 100 each 2   Beta Carotene (VITAMIN A) 25000 UNIT capsule Take 25,000 Units by mouth daily.     Blood Glucose Monitoring Suppl (CONTOUR NEXT EZ MONITOR) w/Device KIT Use to check blood sugar two times per day 1 kit 2   Cholecalciferol (VITAMIN D PO) Take 1 tablet by mouth daily. D3 1000 IU     Continuous Blood Gluc Sensor (FREESTYLE LIBRE 3 SENSOR) MISC 1 Device by Does not apply route every 14 (fourteen) days. Apply 1 sensor on upper arm every 14 days for continuous glucose monitoring 2 each 2   CONTOUR NEXT TEST test strip TEST TWICE DAILY 100 strip 2   diclofenac (VOLTAREN) 75 MG EC tablet TAKE 1 TABLET BY MOUTH TWICE DAILY WITH FOOD 60 tablet 0   fluticasone (FLONASE) 50 MCG/ACT nasal spray Place 2 sprays into both nostrils daily. 16 g 0   gabapentin (NEURONTIN) 600 MG tablet TAKE 1 TABLET(600 MG) BY MOUTH THREE TIMES DAILY 270 tablet 1   glimepiride (AMARYL) 1 MG tablet Take 1 tablet before lunch and bedtime 180 tablet 3   insulin degludec (TRESIBA  FLEXTOUCH) 100 UNIT/ML FlexTouch Pen ADMINISTER UP TO 20 UNITS UNDER THE SKIN DAILY (Patient taking differently: ADMINISTER UP TO 22 UNITS UNDER THE SKIN DAILY) 6 mL 2   Insulin Pen Needle 31G X 5 MM MISC Use daily for insulin pen 100 each 1   JARDIANCE 25 MG TABS tablet TAKE 1 TABLET BY MOUTH EVERY DAY 90 tablet 2   losartan (COZAAR) 100 MG tablet TAKE 1 TABLET(100 MG) BY MOUTH DAILY 90 tablet 1   Magnesium Oxide 400 (240 MG) MG TABS Take 1 tablet by mouth 3 (three) times daily. Take 1 in the morning and 2 in the evening. 90 tablet 2   metFORMIN (GLUCOPHAGE-XR) 500 MG 24 hr tablet TAKE 4 TABLETS BY MOUTH EVERY DAY WITH SUPPER 360 tablet 2   Multiple Vitamin (MULTIVITAMIN WITH MINERALS) TABS tablet Take 1 tablet by mouth daily.     ondansetron (ZOFRAN) 4 MG tablet Take 1 tablet (4 mg total) by mouth 2 (two) times daily as needed for nausea or vomiting. 30 tablet 1   pantoprazole (PROTONIX) 40 MG tablet TAKE 1 TABLET(40 MG) BY MOUTH TWICE DAILY 180 tablet 4   rosuvastatin (CRESTOR) 10 MG tablet TAKE 1 TABLET(10 MG) BY MOUTH DAILY 30 tablet 3   sertraline (ZOLOFT) 100 MG  tablet Take 2 tablets (200 mg total) by mouth daily. 180 tablet 1   sodium zirconium cyclosilicate (LOKELMA) 10 g PACK packet Take 10 g by mouth daily. 1 packet 0   vitamin B-12 (CYANOCOBALAMIN) 500 MCG tablet Take 500 mcg by mouth daily.     VITAMIN E PO Take 1 capsule by mouth daily. 400 IU daily     No current facility-administered medications on file prior to visit.    Family History  Problem Relation Age of Onset   Hypertension Mother    Stroke Father    Diabetes Maternal Grandfather    Colon cancer Neg Hx    Throat cancer Neg Hx    Prostate cancer Neg Hx    Pancreatic cancer Neg Hx    Heart disease Neg Hx    Kidney disease Neg Hx    Liver disease Neg Hx     Social History   Socioeconomic History   Marital status: Single    Spouse name: Not on file   Number of children: 0   Years of education: Not on file    Highest education level: Not on file  Occupational History   Occupation: Agricultural consultant  Tobacco Use   Smoking status: Never   Smokeless tobacco: Never  Substance and Sexual Activity   Alcohol use: No   Drug use: No   Sexual activity: Not on file  Other Topics Concern   Not on file  Social History Narrative   Not on file   Social Determinants of Health   Financial Resource Strain: Not on file  Food Insecurity: Not on file  Transportation Needs: Not on file  Physical Activity: Not on file  Stress: Not on file  Social Connections: Not on file  Intimate Partner Violence: Not on file    Review of Systems: ROS negative except for what is noted on the assessment and plan.  Objective:   Vitals:   08/13/22 0843 08/13/22 0905  BP: (!) 141/89 (!) 142/76  Pulse: 87 82  SpO2: 98%   Weight: (!) 306 lb 12.8 oz (139.2 kg)     Physical Exam: Constitutional: well-appearing man sitting in exam chair, in no acute distress HENT: normocephalic atraumatic, mucous membranes moist Eyes: conjunctiva non-erythematous Neck: supple Cardiovascular: regular rate and rhythm, no m/r/g Pulmonary/Chest: normal work of breathing on room air, lungs clear to auscultation bilaterally Abdominal: soft, non-tender, non-distended MSK: normal bulk and tone Neurological: alert & oriented x 3, 5/5 strength in bilateral upper and lower extremities, normal gait Skin: warm and dry Psych: appropriate mood and affect     Assessment & Plan:   Bilateral leg pain Patient returns to clinic with similar, chronic, intermittent bilateral lower extremity pain on calves, lateral lower extremities that intermittently happens with activity and rest, different from neuropathic pain, which for him is relieved with Gabapentin. Exam today is unchanged from prior. Symptom presentation could be in setting of claudication pain but patient with normal ABIs during last visit, and 2+ and symmetrical DP pulses today. Additional work up  included Vitamin D and B12, with normal levels. Patient with stable dose of statin; last LDL 89 and increased CV risk factors, thus, not inclined to discontinue therapy. Patient had iron labs during last OV consistent with iron deficiency anemia: mild anemia with increased total iron binding capacity at 427 and low iron saturation. This is likely contributing to lower leg discomfort, so will plan adding iron supplementations 3x/weekly with bowel regimen to prevent constipation. I will also plan  to refer patient to physical therapy to aid with exercise tolerance and better characterization of patient's leg discomfort during activity. -Start PO iron supplementation -Bowel regimen  -Referral to physical therapy -Return in 1 month for follow up  Hypertension associated with diabetes ([place]) BP elevated today to 142/76 without medications this am. Patient is presenting to clinic after having worked 3rd shift. Patient usually takes medication a lot later than OV time. - No changes to medication today  Patient seen with Dr. Cain Sieve

## 2022-08-21 ENCOUNTER — Encounter: Payer: Self-pay | Admitting: *Deleted

## 2022-08-21 NOTE — Progress Notes (Signed)
[facility] Quality Team Note  Name: Jack Wallace Date of Birth: [birth year] MRN: [id number] Date: 08/21/2022  [facility] Quality Team has reviewed this patient's chart, please see recommendations below:  AWV;  [facility] Quality Other; (Pt has open gaps for annual wellness visit and A1C.  His last A1C out of range on 07/30/22. However, he does have upcoming appt with endocrinology on 11/18/22 so gap could be closed if they order A1C and it is in range.)

## 2022-08-26 ENCOUNTER — Other Ambulatory Visit: Payer: Self-pay

## 2022-08-26 ENCOUNTER — Encounter (HOSPITAL_COMMUNITY): Payer: Self-pay

## 2022-08-26 ENCOUNTER — Emergency Department (HOSPITAL_COMMUNITY): Payer: BC Managed Care – PPO

## 2022-08-26 ENCOUNTER — Emergency Department (HOSPITAL_COMMUNITY)
Admission: EM | Admit: 2022-08-26 | Discharge: 2022-08-26 | Disposition: A | Discharge status: Home / Self Care | Payer: BC Managed Care – PPO | Attending: Emergency Medicine | Admitting: Emergency Medicine

## 2022-08-26 DIAGNOSIS — Z794 Long term (current) use of insulin: Secondary | ICD-10-CM | POA: Insufficient documentation

## 2022-08-26 DIAGNOSIS — I10 Essential (primary) hypertension: Secondary | ICD-10-CM | POA: Insufficient documentation

## 2022-08-26 DIAGNOSIS — I959 Hypotension, unspecified: Secondary | ICD-10-CM | POA: Diagnosis not present

## 2022-08-26 DIAGNOSIS — R55 Syncope and collapse: Secondary | ICD-10-CM | POA: Insufficient documentation

## 2022-08-26 DIAGNOSIS — Z7982 Long term (current) use of aspirin: Secondary | ICD-10-CM | POA: Diagnosis not present

## 2022-08-26 DIAGNOSIS — R231 Pallor: Secondary | ICD-10-CM | POA: Diagnosis not present

## 2022-08-26 DIAGNOSIS — E119 Type 2 diabetes mellitus without complications: Secondary | ICD-10-CM | POA: Diagnosis not present

## 2022-08-26 DIAGNOSIS — Z79899 Other long term (current) drug therapy: Secondary | ICD-10-CM | POA: Insufficient documentation

## 2022-08-26 DIAGNOSIS — R42 Dizziness and giddiness: Secondary | ICD-10-CM | POA: Diagnosis not present

## 2022-08-26 DIAGNOSIS — Z7984 Long term (current) use of oral hypoglycemic drugs: Secondary | ICD-10-CM | POA: Insufficient documentation

## 2022-08-26 LAB — BASIC METABOLIC PANEL
Anion gap: 12 (ref 5–15)
BUN: 18 mg/dL (ref 8–23)
CO2: 22 mmol/L (ref 22–32)
Calcium: 9.2 mg/dL (ref 8.9–10.3)
Chloride: 107 mmol/L (ref 98–111)
Creatinine, Ser: 1.72 mg/dL — ABNORMAL HIGH (ref 0.61–1.24)
GFR, Estimated: 45 mL/min — ABNORMAL LOW (ref >60)
Glucose, Bld: 154 mg/dL — ABNORMAL HIGH (ref 70–99)
Potassium: 4.3 mmol/L (ref 3.5–5.1)
Sodium: 141 mmol/L (ref 135–145)

## 2022-08-26 LAB — CBC
HCT: 44.1 % (ref 39.0–52.0)
Hemoglobin: 13.2 g/dL (ref 13.0–17.0)
MCH: 25.6 pg — ABNORMAL LOW (ref 26.0–34.0)
MCHC: 29.9 g/dL — ABNORMAL LOW (ref 30.0–36.0)
MCV: 85.5 fL (ref 80.0–100.0)
Platelets: 280 10*3/uL (ref 150–400)
RBC: 5.16 MIL/uL (ref 4.22–5.81)
RDW: 17 % — ABNORMAL HIGH (ref 11.5–15.5)
WBC: 9 10*3/uL (ref 4.0–10.5)
nRBC: 0 % (ref 0.0–0.2)

## 2022-08-26 LAB — CK: Total CK: 101 U/L (ref 49–397)

## 2022-08-26 LAB — TROPONIN I (HIGH SENSITIVITY): Troponin I (High Sensitivity): 6 ng/L (ref <18)

## 2022-08-26 LAB — MAGNESIUM: Magnesium: 1.3 mg/dL — ABNORMAL LOW (ref 1.7–2.4)

## 2022-08-26 MED ORDER — LACTATED RINGERS IV BOLUS
1000.0000 mL | Freq: Once | INTRAVENOUS | Status: AC
  Administered 2022-08-26: 1000 mL via INTRAVENOUS

## 2022-08-26 MED ORDER — MAGNESIUM SULFATE 2 GM/50ML IV SOLN
2.0000 g | Freq: Once | INTRAVENOUS | Status: AC
  Administered 2022-08-26: 2 g via INTRAVENOUS
  Filled 2022-08-26: qty 50

## 2022-08-26 NOTE — Discharge Instructions (Signed)
You were evaluated in the Emergency Department and after careful evaluation, we did not find any emergent condition requiring admission or further testing in the hospital.  Work-up today was overall reassuring, your kidney function was a bit elevated today, I suspect this is due to dehydration.  Please continue to drink plenty of fluids.  Follow-up with your primary care doctor to have your kidney function rechecked.  Please return to the Emergency Department if you experience any worsening of your condition.  Thank you for allowing [place] to be a part of your care.

## 2022-08-26 NOTE — ED Provider Notes (Signed)
[facility] EMERGENCY DEPARTMENT Provider Note   CSN: [id number] Arrival date & time: 08/26/22  0405     History  Chief Complaint  Patient presents with   Near Syncope    Jack Wallace is a 61 y.o. male.  HPI 61 year old male with a history of hypertension, DM type II, hyperlipidemia, ischemic colitis and depression presents to the ER for near syncopal event.  Patient states that he was at work, he works inside but the area is not air conditioned and he gets very hot.  He states that when he gets very hot he does not have the desire to drink fluids or eat.  Has not been eating or drinking much all day.  Says he felt dizzy and almost lost consciousness but did not lose consciousness.  Denies any chest pain, shortness of breath, headache surrounding the fall.  His systolic was 754 with EMS and he was given 300 cc of normal saline.  He states that he does feel quite a bit better now.  He denies any persistent chest pain, shortness of breath, back pain, abdominal pain.  He admits that he probably has not been drinking enough water.    Home Medications Prior to Admission medications   Medication Sig Start Date End Date Taking? Authorizing Provider  albuterol (VENTOLIN HFA) 108 (90 Base) MCG/ACT inhaler Inhale 2 puffs into the lungs every 6 (six) hours as needed for wheezing or shortness of breath. 08/23/21   Mar Daring, PA-C  Alpha Lipoic Acid 200 MG CAPS Take 200 mg by mouth. 2 pills daily    [provider]  Ascorbic Acid (VITAMIN C) 1000 MG tablet Take 1,000 mg by mouth daily.    [provider]  aspirin 81 MG tablet Take 81 mg by mouth daily.    [provider]  BAYER MICROLET LANCETS lancets Use to check blood sugar two times per day 12/15/16   Elayne Snare, MD  Beta Carotene (VITAMIN A) 25000 UNIT capsule Take 25,000 Units by mouth daily.    [provider]  Blood Glucose Monitoring Suppl (CONTOUR NEXT EZ MONITOR) w/Device KIT  Use to check blood sugar two times per day 12/15/16   Elayne Snare, MD  Cholecalciferol (VITAMIN D PO) Take 1 tablet by mouth daily. D3 1000 IU    [provider]  Continuous Blood Gluc Sensor (FREESTYLE LIBRE 3 SENSOR) MISC 1 Device by Does not apply route every 14 (fourteen) days. Apply 1 sensor on upper arm every 14 days for continuous glucose monitoring 05/06/22   Elayne Snare, MD  CONTOUR NEXT TEST test strip TEST TWICE DAILY 05/07/22   Elayne Snare, MD  diclofenac (VOLTAREN) 75 MG EC tablet TAKE 1 TABLET BY MOUTH TWICE DAILY WITH FOOD 08/09/18   Dickie La, MD  ferrous sulfate 325 (65 FE) MG tablet Take 1 tablet (325 mg total) by mouth every other day. On Monday, Wednesday, and Friday 08/13/22 08/13/23  Romana Juniper, MD  fluticasone [facility]) 50 MCG/ACT nasal spray Place 2 sprays into both nostrils daily. 08/23/21   Mar Daring, PA-C  gabapentin (NEURONTIN) 600 MG tablet TAKE 1 TABLET(600 MG) BY MOUTH THREE TIMES DAILY 04/13/22   Elayne Snare, MD  glimepiride (AMARYL) 1 MG tablet Take 1 tablet before lunch and bedtime 02/16/22   Elayne Snare, MD  insulin degludec (TRESIBA FLEXTOUCH) 100 UNIT/ML FlexTouch Pen ADMINISTER UP TO 20 UNITS UNDER THE SKIN DAILY Patient taking differently: ADMINISTER UP TO 22 UNITS UNDER THE  SKIN DAILY 07/13/22   Elayne Snare, MD  Insulin Pen Needle 31G X 5 MM MISC Use daily for insulin pen 03/31/22   Elayne Snare, MD  JARDIANCE 25 MG TABS tablet TAKE 1 TABLET BY MOUTH EVERY DAY 06/01/22   Elayne Snare, MD  losartan (COZAAR) 100 MG tablet TAKE 1 TABLET(100 MG) BY MOUTH DAILY 04/07/22   Elayne Snare, MD  Magnesium Oxide 400 (240 MG) MG TABS Take 1 tablet by mouth 3 (three) times daily. Take 1 in the morning and 2 in the evening. 07/18/15   Lenore Cordia, MD  metFORMIN (GLUCOPHAGE-XR) 500 MG 24 hr tablet TAKE 4 TABLETS BY MOUTH EVERY DAY WITH SUPPER 01/22/22   Elayne Snare, MD  Multiple Vitamin (MULTIVITAMIN WITH MINERALS) TABS tablet Take 1 tablet by mouth daily.     [provider]  ondansetron (ZOFRAN) 4 MG tablet Take 1 tablet (4 mg total) by mouth 2 (two) times daily as needed for nausea or vomiting. 03/02/22 03/02/23  Mitzi Hansen, MD  pantoprazole (PROTONIX) 40 MG tablet TAKE 1 TABLET(40 MG) BY MOUTH TWICE DAILY 10/01/21   Christian, Rylee, MD  polyethylene glycol (MIRALAX) 17 g packet Take 17 g by mouth daily. 08/13/22   Romana Juniper, MD  rosuvastatin (CRESTOR) 10 MG tablet TAKE 1 TABLET(10 MG) BY MOUTH DAILY 06/16/22   Elayne Snare, MD  sertraline (ZOLOFT) 100 MG tablet Take 2 tablets (200 mg total) by mouth daily. 01/06/22 05/06/22  Mitzi Hansen, MD  sodium zirconium cyclosilicate (LOKELMA) 10 g PACK packet Take 10 g by mouth daily. 01/07/22   Mitzi Hansen, MD  vitamin B-12 (CYANOCOBALAMIN) 500 MCG tablet Take 500 mcg by mouth daily.    [provider]  VITAMIN E PO Take 1 capsule by mouth daily. 400 IU daily    [provider]      Allergies    Patient has no known allergies.    Review of Systems   Review of Systems Ten systems reviewed and are negative for acute change, except as noted in the HPI.   Physical Exam Updated Vital Signs BP 114/83   Pulse 89   Temp 99 F (37.2 C) (Oral)   Resp 16   Ht 6' (1.829 m)   Wt (!) 138.8 kg   SpO2 98%   BMI 41.50 kg/m  Physical Exam Vitals and nursing note reviewed.  Constitutional:      General: He is not in acute distress.    Appearance: He is well-developed.  HENT:     Head: Normocephalic and atraumatic.  Eyes:     Conjunctiva/sclera: Conjunctivae normal.  Cardiovascular:     Rate and Rhythm: Normal rate and regular rhythm.     Heart sounds: No murmur heard. Pulmonary:     Effort: Pulmonary effort is normal. No respiratory distress.     Breath sounds: Normal breath sounds.  Abdominal:     Palpations: Abdomen is soft.     Tenderness: There is no abdominal tenderness.  Musculoskeletal:        General: No swelling.     Cervical back: Neck  supple.  Skin:    General: Skin is warm and dry.     Capillary Refill: Capillary refill takes less than 2 seconds.  Neurological:     Mental Status: He is alert.  Psychiatric:        Mood and Affect: Mood normal.     ED Results / Procedures / Treatments   Labs (all labs ordered are listed, but  only abnormal results are displayed) Labs Reviewed  BASIC METABOLIC PANEL - Abnormal; Notable for the following components:      Result Value   Glucose, Bld 154 (*)    Creatinine, Ser 1.72 (*)    GFR, Estimated 45 (*)    All other components within normal limits  CBC - Abnormal; Notable for the following components:   MCH 25.6 (*)    MCHC 29.9 (*)    RDW 17.0 (*)    All other components within normal limits  MAGNESIUM - Abnormal; Notable for the following components:   Magnesium 1.3 (*)    All other components within normal limits  CK  URINALYSIS, ROUTINE W REFLEX MICROSCOPIC  CBG MONITORING, ED  TROPONIN I (HIGH SENSITIVITY)  TROPONIN I (HIGH SENSITIVITY)    EKG None  Radiology DG Chest Portable 1 View  Result Date: 08/26/2022 CLINICAL DATA:  Near syncope EXAM: PORTABLE CHEST 1 VIEW COMPARISON:  05/07/2014 FINDINGS: Artifact from EKG leads. Normal heart size and mediastinal contours. No acute infiltrate or edema. No effusion or pneumothorax. No acute osseous findings. IMPRESSION: No evidence of acute disease. Electronically Signed   By: Jorje Guild M.D.   On: 08/26/2022 04:50    Procedures Procedures    Medications Ordered in ED Medications  lactated ringers bolus 1,000 mL (1,000 mLs Intravenous New Bag/Given 08/26/22 0437)  magnesium sulfate IVPB 2 g 50 mL (2 g Intravenous New Bag/Given 08/26/22 0540)    ED Course/ Medical Decision Making/ A&P                           Medical Decision Making Amount and/or Complexity of Data Reviewed Labs: ordered. Radiology: ordered.  Risk Prescription drug management.  61 year old male presents to the ER with near syncopal  event.  On arrival, he is well-appearing, not diaphoretic, no acute distress, resting comfortably in the ER bed.  Vitals overall reassuring.  Physical exam is unremarkable, no focal neurodeficits, no abdominal pain, chest pain, lung sounds clear.  Differential diagnosis includes hypovolemic near syncope, orthostatic near syncope, ACS, dissection, PE, arrhythmia  Labs ordered, reviewed and interpreted by me.  CBC without leukocytosis, normal hemoglobin.  BMP that any significant electrode abnormalities, creatinine of 1.72 likely consistent with dehydration.  Magnesium of 1.3, repleted.  CK of 101.  Troponin of 6.   Chest x-ray ordered, reviewed and interpreted by me, no acute abnormalities.  EKG reviewed, normal sinus rhythm.  Patient was given 1 L fluid resuscitation and magnesium replacement.  He states he feels much better. Ambulated without difficulty. Suspect near syncope secondary to dehydration.  Low suspicion for ACS, PE, dissection. Not hypoxic, no CP.  No evidence of rhabdo. Recommended continuing PO fluid resuscitation, following up with PCP for recheck of his creatinine.  We discussed return precautions.  He voiced her standing and is agreeable.  Stable for discharge.   Final Clinical Impression(s) / ED Diagnoses Final diagnoses:  Near syncope    Rx / DC Orders ED Discharge Orders     None         Garald Balding, PA-C 08/26/22 [place], [place], DO 08/26/22 2312

## 2022-08-26 NOTE — ED Triage Notes (Signed)
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 Patient has pain in bilateral legs described as cramping.  EMS reports patient pale and diaphoretic denies CP.  Initial BP 100palp ems gave 300cc NS

## 2022-08-26 NOTE — ED Notes (Signed)
Patient ambulated with tech without difficulty or need of assistance.

## 2022-08-27 ENCOUNTER — Telehealth: Payer: Self-pay

## 2022-08-27 NOTE — Patient Outreach (Signed)
  Care Coordination TOC Note Transition Care Management Follow-up Telephone Call Date of discharge and from where: [facility] 08/26/22 How have you been since you were released from the hospital? "I still feel shaky and just not right" Any questions or concerns? Yes-Discussed patients continued symptoms and he noted he checked his CBG reading and it was okay.  He is eating and drinking well.  Encouraged patient to come into clinic for assessment next week and he agrees to early morning appointment as he works 3rd shift.  Message sent to [facility] desk staff to schedule and contact patient.  Items Reviewed: Did the pt receive and understand the discharge instructions provided? Yes  Medications obtained and verified? Yes  Other? No  Any new allergies since your discharge? No  Dietary orders reviewed? Yes Do you have support at home? Yes   Home Care and Equipment/Supplies: Were home health services ordered? no If so, what is the name of the agency? N/A  Has the agency set up a time to come to the patient's home? not applicable Were any new equipment or medical supplies ordered?  No What is the name of the medical supply agency? N/A Were you able to get the supplies/equipment? no Do you have any questions related to the use of the equipment or supplies? No  Functional Questionnaire: (I = Independent and D = Dependent) ADLs: I  Bathing/Dressing- I  Meal Prep- I  Eating- I  Maintaining continence- I  Transferring/Ambulation- I  Managing Meds- I  Follow up appointments reviewed:  PCP Hospital f/u appt confirmed? Yes  Scheduled to see Dr. Lisabeth Devoid on 09/17/22 @ 0845 (staff to move up appointment for next week). [facility] f/u appt confirmed? Yes  Scheduled to see Dr. Dwyane Dee on 11/16/22 @ 1:45. Are transportation arrangements needed? No  If their condition worsens, is the pt aware to call PCP or go to the Emergency Dept.? Yes Was the patient provided with contact information for  the PCP's office or ED? Yes Was to pt encouraged to call back with questions or concerns? Yes  SDOH assessments and interventions completed:   Yes  Care Coordination Interventions Activated:  Yes   Care Coordination Interventions:  PCP follow up appointment requested    Encounter Outcome:  Pt. Visit Completed

## 2022-09-01 ENCOUNTER — Ambulatory Visit (INDEPENDENT_AMBULATORY_CARE_PROVIDER_SITE_OTHER): Payer: BC Managed Care – PPO | Admitting: Internal Medicine

## 2022-09-01 VITALS — BP 142/87 | HR 82 | Temp 99.5°F | Ht 72.0 in | Wt 311.4 lb

## 2022-09-01 DIAGNOSIS — I152 Hypertension secondary to endocrine disorders: Secondary | ICD-10-CM

## 2022-09-01 DIAGNOSIS — I10 Essential (primary) hypertension: Secondary | ICD-10-CM

## 2022-09-01 DIAGNOSIS — M79605 Pain in left leg: Secondary | ICD-10-CM

## 2022-09-01 DIAGNOSIS — E1159 Type 2 diabetes mellitus with other circulatory complications: Secondary | ICD-10-CM

## 2022-09-01 DIAGNOSIS — N179 Acute kidney failure, unspecified: Secondary | ICD-10-CM | POA: Diagnosis not present

## 2022-09-01 DIAGNOSIS — Z23 Encounter for immunization: Secondary | ICD-10-CM | POA: Diagnosis not present

## 2022-09-01 DIAGNOSIS — M79604 Pain in right leg: Secondary | ICD-10-CM

## 2022-09-01 DIAGNOSIS — E1142 Type 2 diabetes mellitus with diabetic polyneuropathy: Secondary | ICD-10-CM

## 2022-09-01 NOTE — Progress Notes (Unsigned)
Subjective:  CC: ED follow-up  HPI:  [name] is a 61 y.o. male with a past medical history stated below and presents today for ED follow-up after presenting for pre-syncopal event on 9/6. He states that he went into work at [employer] for 3rd shift and then started to feel light-headed and unwell. He checked his blood pressure at work at is was 70/60. He did not pass out but felt close and went to ED. There work-up was completed including BMP, troponin, CXR, and EKG. Blood sugar within normal limits. BMP showed elevated creatinine to 1.7. and magnesium of 1.3. He was given 1 L fluids and magnesium supplementation and felt better. Please see problem based assessment and plan for additional details.  Past Medical History:  Diagnosis Date   Depression    Diabetes mellitus without complication ([place])    Hyperlipidemia    Hypertension    Ischemic colitis ([place])     Current Outpatient Medications on File Prior to Visit  Medication Sig Dispense Refill   albuterol (VENTOLIN HFA) 108 (90 Base) MCG/ACT inhaler Inhale 2 puffs into the lungs every 6 (six) hours as needed for wheezing or shortness of breath. 8 g 0   Alpha Lipoic Acid 200 MG CAPS Take 200 mg by mouth. 2 pills daily     Ascorbic Acid (VITAMIN C) 1000 MG tablet Take 1,000 mg by mouth daily.     aspirin 81 MG tablet Take 81 mg by mouth daily.     BAYER MICROLET LANCETS lancets Use to check blood sugar two times per day 100 each 2   Beta Carotene (VITAMIN A) 25000 UNIT capsule Take 25,000 Units by mouth daily.     Blood Glucose Monitoring Suppl (CONTOUR NEXT EZ MONITOR) w/Device KIT Use to check blood sugar two times per day 1 kit 2   Cholecalciferol (VITAMIN D PO) Take 1 tablet by mouth daily. D3 1000 IU     Continuous Blood Gluc Sensor (FREESTYLE LIBRE 3 SENSOR) MISC 1 Device by Does not apply route every 14 (fourteen) days. Apply 1 sensor on upper arm every 14 days for continuous glucose monitoring 2 each 2   CONTOUR NEXT TEST  test strip TEST TWICE DAILY 100 strip 2   diclofenac (VOLTAREN) 75 MG EC tablet TAKE 1 TABLET BY MOUTH TWICE DAILY WITH FOOD 60 tablet 0   ferrous sulfate 325 (65 FE) MG tablet Take 1 tablet (325 mg total) by mouth every other day. On Monday, Wednesday, and Friday 15 tablet 11   fluticasone (FLONASE) 50 MCG/ACT nasal spray Place 2 sprays into both nostrils daily. 16 g 0   gabapentin (NEURONTIN) 600 MG tablet TAKE 1 TABLET(600 MG) BY MOUTH THREE TIMES DAILY 270 tablet 1   glimepiride (AMARYL) 1 MG tablet Take 1 tablet before lunch and bedtime 180 tablet 3   insulin degludec (TRESIBA FLEXTOUCH) 100 UNIT/ML FlexTouch Pen ADMINISTER UP TO 20 UNITS UNDER THE SKIN DAILY (Patient taking differently: ADMINISTER UP TO 22 UNITS UNDER THE SKIN DAILY) 6 mL 2   Insulin Pen Needle 31G X 5 MM MISC Use daily for insulin pen 100 each 1   JARDIANCE 25 MG TABS tablet TAKE 1 TABLET BY MOUTH EVERY DAY 90 tablet 2   metFORMIN (GLUCOPHAGE-XR) 500 MG 24 hr tablet TAKE 4 TABLETS BY MOUTH EVERY DAY WITH SUPPER 360 tablet 2   Multiple Vitamin (MULTIVITAMIN WITH MINERALS) TABS tablet Take 1 tablet by mouth daily.     ondansetron (ZOFRAN) 4 MG  tablet Take 1 tablet (4 mg total) by mouth 2 (two) times daily as needed for nausea or vomiting. 30 tablet 1   pantoprazole (PROTONIX) 40 MG tablet TAKE 1 TABLET(40 MG) BY MOUTH TWICE DAILY 180 tablet 4   polyethylene glycol (MIRALAX) 17 g packet Take 17 g by mouth daily. 14 each 0   rosuvastatin (CRESTOR) 10 MG tablet TAKE 1 TABLET(10 MG) BY MOUTH DAILY 30 tablet 3   sertraline (ZOLOFT) 100 MG tablet Take 2 tablets (200 mg total) by mouth daily. 180 tablet 1   vitamin B-12 (CYANOCOBALAMIN) 500 MCG tablet Take 500 mcg by mouth daily.     No current facility-administered medications on file prior to visit.    Family History  Problem Relation Age of Onset   Hypertension Mother    Stroke Father    Diabetes Maternal Grandfather    Colon cancer Neg Hx    Throat cancer Neg Hx     Prostate cancer Neg Hx    Pancreatic cancer Neg Hx    Heart disease Neg Hx    Kidney disease Neg Hx    Liver disease Neg Hx     Social History   Socioeconomic History   Marital status: Single    Spouse name: Not on file   Number of children: 0   Years of education: Not on file   Highest education level: Not on file  Occupational History   Occupation: Agricultural consultant  Tobacco Use   Smoking status: Never   Smokeless tobacco: Never  Substance and Sexual Activity   Alcohol use: No   Drug use: No   Sexual activity: Not on file  Other Topics Concern   Not on file  Social History Narrative   Not on file   Social Determinants of Health   Financial Resource Strain: Not on file  Food Insecurity: Not on file  Transportation Needs: No Transportation Needs (08/27/2022)   PRAPARE - Hydrologist (Medical): No    Lack of Transportation (Non-Medical): No  Physical Activity: Not on file  Stress: Not on file  Social Connections: Not on file  Intimate Partner Violence: Not on file    Review of Systems: ROS negative except for what is noted on the assessment and plan.  Objective:   Vitals:   09/01/22 0941 09/01/22 1013  BP: (!) 154/93 (!) 142/87  Pulse: 88 82  Temp: 99.5 F (37.5 C)   TempSrc: Oral   SpO2: 98%   Weight: (!) 311 lb 6.4 oz (141.3 kg)   Height: 6' (1.829 m)     Physical Exam: Constitutional: well-appearing, in no acute distress HENT: normocephalic atraumatic, mucous membranes moist Eyes: conjunctiva non-erythematous Neck: supple Cardiovascular: regular rate and rhythm, no m/r/g Pulmonary/Chest: normal work of breathing on room air, lungs clear to auscultation bilaterally Neurological: alert & oriented x 3, normal gait Skin: warm and dry Psych: normal mood and affect     Assessment & Plan:  AKI (acute kidney injury) ([place]) Creatinine elevated to 1.7 at ED visit 9/6. He states that he had not been drinking much fluids. Denies recent  GI illness. On chart review he did have AKI in Aug when he was following with endocrine that resolved. Baseline creatinine 1.0-1.2.  Plan: Repeat BMP showed creatinine back to baseline of 1.1.  Hypomagnesemia Magnesium low at 1.3 at recent ED visit. He previously took magnesium supplementation but hasnt in some time. P: Magnesium removed from medication list. Will recheck magnesium and he  may need this added back if remains low.  Type 2 diabetes mellitus ([place]) Follows with Dr. Dwyane Dee, endocrinology. Last visit 8/23. A1c 8.1 Medications: Metformin ER 1500 mg qd, Glimepiride 1 mg BID, tresiba 22 units, jardiance 25 Plan: Follow-up 10/23 with Dr. Dwyane Dee  Hypertension associated with diabetes ([place]) Blood pressure uncontrolled today at 154/93 to 142/87. He had a recent episode of pre-syncope 9/6 and his blood pressure was low at work, ED work-up showed AKI. Medications include losartan 100 mg that he takes at 10 pm before going into work. He has been drinking more water and gatorade since the episode last week and feels better. P: Repeat BMP with creatinine back to baseline of 1.1.  Decrease losartan from 100 mg to 50 mg Start amlodipine 5 mg Follow-up with Dr. Lisabeth Devoid 9/26 for HTN  Bilateral leg pain He will start PT 9/13. Follow-up with Dr. Lisabeth Devoid  9/26.    Patient discussed with Dr. Walden Field Melisssa Donner, D.O. [facility]  PGY-2 Pager: [phone]  Phone: [phone] Date 09/02/2022  Time 8:09 AM

## 2022-09-01 NOTE — Patient Instructions (Addendum)
Thank you, [name] for allowing [place] to provide your care today.  Ed follow-up I think that your symptoms are from not drinking enough fluid. Please try to drink 2L fluid daily.  Blood pressure Your blood pressure is elevated today. I would like to check kidney labs and will call you. We may need to adjust your blood pressure medications.  Flu shot  I have ordered the following labs for you:  Lab Orders         BMP8+Anion Gap         Magnesium       Referrals ordered today:   Referral Orders  No referral(s) requested today     I have ordered the following medication/changed the following medications:   Stop the following medications: Medications Discontinued During This Encounter  Medication Reason   sodium zirconium cyclosilicate (LOKELMA) 10 g PACK packet Discontinued by provider   Magnesium Oxide 400 (240 MG) MG TABS Discontinued by provider   VITAMIN E PO Discontinued by provider     Start the following medications: No orders of the defined types were placed in this encounter.    Follow up: 9/26 for leg pain, hopefully PT will help!   Remember: Please drink 2L of fluid a day.  We look forward to seeing you next time. Please call our clinic at [phone] if you have any questions or concerns. The best time to call is Monday-Friday from 9am-4pm, but there is someone available 24/7. If after hours or the weekend, call the main hospital number and ask for the Internal Medicine Resident On-Call. If you need medication refills, please notify your pharmacy one week in advance and they will send [place] a request.   Thank you for trusting me with your care. Wishing you the best!   Christiana Fuchs, [place]

## 2022-09-02 ENCOUNTER — Ambulatory Visit: Payer: BC Managed Care – PPO

## 2022-09-02 DIAGNOSIS — N179 Acute kidney failure, unspecified: Secondary | ICD-10-CM | POA: Insufficient documentation

## 2022-09-02 LAB — BMP8+ANION GAP
Anion Gap: 20 mmol/L — ABNORMAL HIGH (ref 10.0–18.0)
BUN/Creatinine Ratio: 19 (ref 10–24)
BUN: 21 mg/dL (ref 8–27)
CO2: 23 mmol/L (ref 20–29)
Calcium: 9.7 mg/dL (ref 8.6–10.2)
Chloride: 100 mmol/L (ref 96–106)
Creatinine, Ser: 1.13 mg/dL (ref 0.76–1.27)
Glucose: 80 mg/dL (ref 70–99)
Potassium: 4.9 mmol/L (ref 3.5–5.2)
Sodium: 143 mmol/L (ref 134–144)
eGFR: 74 mL/min/{1.73_m2} (ref >59)

## 2022-09-02 LAB — MAGNESIUM: Magnesium: 1.8 mg/dL (ref 1.6–2.3)

## 2022-09-02 MED ORDER — LOSARTAN POTASSIUM 50 MG PO TABS: 50.0000 mg | ORAL_TABLET | Freq: Every day | ORAL | 11 refills | Status: DC

## 2022-09-02 MED ORDER — AMLODIPINE BESYLATE 5 MG PO TABS: 5.0000 mg | ORAL_TABLET | Freq: Every day | ORAL | 11 refills | Status: DC

## 2022-09-02 NOTE — Assessment & Plan Note (Signed)
Magnesium low at 1.3 at recent ED visit. He previously took magnesium supplementation but hasnt in some time. P: Magnesium removed from medication list. Will recheck magnesium and he may need this added back if remains low.

## 2022-09-02 NOTE — Assessment & Plan Note (Signed)
He will start PT 9/13. Follow-up with Dr. Lisabeth Devoid  9/26.

## 2022-09-02 NOTE — Assessment & Plan Note (Addendum)
Creatinine elevated to 1.7 at ED visit 9/6. He states that he had not been drinking much fluids. Denies recent GI illness. On chart review he did have AKI in Aug when he was following with endocrine that resolved. Baseline creatinine 1.0-1.2.  Plan: Repeat BMP showed creatinine back to baseline of 1.1.

## 2022-09-02 NOTE — Assessment & Plan Note (Addendum)
Blood pressure uncontrolled today at 154/93 to 142/87. He had a recent episode of pre-syncope 9/6 and his blood pressure was low at work, ED work-up showed AKI. Medications include losartan 100 mg that he takes at 10 pm before going into work. He has been drinking more water and gatorade since the episode last week and feels better. P: Repeat BMP with creatinine back to baseline of 1.1.  Decrease losartan from 100 mg to 50 mg Start amlodipine 5 mg Follow-up with Dr. Lisabeth Devoid 9/26 for HTN

## 2022-09-02 NOTE — Assessment & Plan Note (Signed)
Follows with Dr. Dwyane Dee, endocrinology. Last visit 8/23. A1c 8.1 Medications: Metformin ER 1500 mg qd, Glimepiride 1 mg BID, tresiba 22 units, jardiance 25 Plan: Follow-up 10/23 with Dr. Dwyane Dee

## 2022-09-03 ENCOUNTER — Encounter: Payer: Self-pay | Admitting: Internal Medicine

## 2022-09-03 NOTE — Progress Notes (Signed)
Internal Medicine Clinic Attending  Case discussed with Dr. Masters  at the time of the visit.  We reviewed the resident's history and exam and pertinent patient test results.  I agree with the assessment, diagnosis, and plan of care documented in the resident's note.  

## 2022-09-07 ENCOUNTER — Other Ambulatory Visit: Payer: Self-pay

## 2022-09-07 DIAGNOSIS — F321 Major depressive disorder, single episode, moderate: Secondary | ICD-10-CM

## 2022-09-07 NOTE — Telephone Encounter (Signed)
sertraline (ZOLOFT) 100 MG tablet (Expired), refill request @ [facility] [phone] - [place], [place] - [address] AT [place].

## 2022-09-14 MED ORDER — SERTRALINE HCL 100 MG PO TABS: 200.0000 mg | ORAL_TABLET | Freq: Every day | ORAL | 0 refills | Status: DC

## 2022-09-15 ENCOUNTER — Encounter: Payer: Self-pay | Admitting: Dietician

## 2022-09-15 ENCOUNTER — Other Ambulatory Visit: Payer: Self-pay

## 2022-09-15 ENCOUNTER — Ambulatory Visit: Payer: BC Managed Care – PPO | Admitting: Student

## 2022-09-15 ENCOUNTER — Encounter: Payer: Self-pay | Admitting: Student

## 2022-09-15 DIAGNOSIS — E1159 Type 2 diabetes mellitus with other circulatory complications: Secondary | ICD-10-CM

## 2022-09-15 DIAGNOSIS — I152 Hypertension secondary to endocrine disorders: Secondary | ICD-10-CM | POA: Diagnosis not present

## 2022-09-15 DIAGNOSIS — M79604 Pain in right leg: Secondary | ICD-10-CM | POA: Diagnosis not present

## 2022-09-15 DIAGNOSIS — K2100 Gastro-esophageal reflux disease with esophagitis, without bleeding: Secondary | ICD-10-CM | POA: Diagnosis not present

## 2022-09-15 DIAGNOSIS — M79605 Pain in left leg: Secondary | ICD-10-CM

## 2022-09-15 MED ORDER — PANTOPRAZOLE SODIUM 40 MG PO TBEC: DELAYED_RELEASE_TABLET | ORAL | 4 refills | Status: DC

## 2022-09-15 NOTE — Patient Instructions (Addendum)
It was a pleasure seeing you in clinic today  Hypertension: You blood pressure is looking better today. Continue your current medications  Leg pain I am sorry  you cannot go to physical therapy. Please try at home leg exercises to help with you muscle pain.

## 2022-09-16 NOTE — Progress Notes (Signed)
Internal Medicine Clinic Attending ? ?Case discussed with Dr. Liang  At the time of the visit.  We reviewed the resident?s history and exam and pertinent patient test results.  I agree with the assessment, diagnosis, and plan of care documented in the resident?s note. ? ?

## 2022-09-16 NOTE — Progress Notes (Signed)
Established Patient Office Visit  Subjective   Patient ID: Jack Wallace, male    DOB: 1961-03-18  Age: 61 y.o. MRN: [id number]  Chief Complaint  Patient presents with   Follow-up    ROUTINE OFFICE VISIT TO FOLLOW UP ON LAST VISIT / MEDICATION REFILL / CHRONIC PAIN IN LEGS #10.    Jack Wallace is a 61 year old person living with history listed below who presents today for follow-up of blood pressure and leg cramping.    Patient Active Problem List   Diagnosis Date Noted   AKI (acute kidney injury) ([place]) 09/02/2022   Bilateral leg pain 07/03/2022   Hyperkalemia 01/07/2022   Pain of joint of left ankle and foot 04/01/2018   MDD (major depressive disorder) 11/13/2015   GERD (gastroesophageal reflux disease) 08/20/2015   Chronic nausea 03/29/2015   Preventative health care 08/23/2014   Hypomagnesemia 05/23/2014   Diabetic neuropathy ([place]) 08/01/2013   Hypertension associated with diabetes ([place]) 07/31/2013   Hyperlipidemia 07/31/2013   Type 2 diabetes mellitus ([place]) 07/31/1992      Review of Systems  Constitutional:  Negative for chills and fever.  Cardiovascular:  Negative for chest pain.  Gastrointestinal:  Negative for abdominal pain.  Musculoskeletal:  Positive for myalgias. Negative for falls.  Skin:  Negative for rash.  Neurological:  Positive for dizziness. Negative for sensory change, focal weakness and loss of consciousness.      Objective:     BP (!) 138/94 (BP Location: Left Arm, Patient Position: Sitting, Cuff Size: Normal)   Pulse 84   Temp 98.2 F (36.8 C) (Oral)   Ht 6' (1.829 m)   Wt (!) 308 lb 3.2 oz (139.8 kg)   SpO2 100%   BMI 41.80 kg/m  BP Readings from Last 3 Encounters:  09/15/22 (!) 138/94  09/01/22 (!) 142/87  08/26/22 125/79      Physical Exam Constitutional:      General: He is not in acute distress.    Appearance: Normal appearance.  HENT:     Head: Normocephalic and atraumatic.  Cardiovascular:     Rate and Rhythm: Normal  rate and regular rhythm.  Pulmonary:     Effort: Pulmonary effort is normal.     Breath sounds: Normal breath sounds. No rhonchi or rales.  Abdominal:     General: Abdomen is flat. Bowel sounds are normal. There is no distension.  Musculoskeletal:     Comments: Diffuse TTP of bilateral calves, no pain to knees, ankles, or hips, no signs of syovitis   Skin:    General: Skin is warm and dry.  Neurological:     Mental Status: He is alert.      Results for orders placed or performed in visit on 09/15/22  HM DIABETES EYE EXAM  Result Value Ref Range   HM Diabetic Eye Exam No Retinopathy No Retinopathy       The 10-year ASCVD risk score (Arnett DK, et al., 2019) is: 21.9%    Assessment & Plan:   Problem List Items Addressed This Visit       Cardiovascular and Mediastinum   Hypertension associated with diabetes ([place]) (Chronic)    BP improved to 138/94 in office today.  He continues to have episodes of dizziness while standing at work.  No further syncopal episodes.  He is trying to drink more water at work which helps with this as well.  We will not increase his antihypertensive medications at this time given his continued symptoms.  Digestive   GERD (gastroesophageal reflux disease) (Chronic)   Relevant Medications   pantoprazole (PROTONIX) 40 MG tablet     Other   Bilateral leg pain (Chronic)    He is unable to afford PT for his leg cramping.  He continues to take oral iron but has not noticed any difference in his cramping.  Patient reports he is quite sedentary outside of work does stand all day at work.  Discussed regular exercise outside of work and muscle strengthening to help with his cramping.       Return in about 3 months (around 12/15/2022).    Iona Beard, MD

## 2022-09-16 NOTE — Assessment & Plan Note (Signed)
He is unable to afford PT for his leg cramping.  He continues to take oral iron but has not noticed any difference in his cramping.  Patient reports he is quite sedentary outside of work does stand all day at work.  Discussed regular exercise outside of work and muscle strengthening to help with his cramping.

## 2022-09-16 NOTE — Assessment & Plan Note (Signed)
BP improved to 138/94 in office today.  He continues to have episodes of dizziness while standing at work.  No further syncopal episodes.  He is trying to drink more water at work which helps with this as well.  We will not increase his antihypertensive medications at this time given his continued symptoms.

## 2022-10-05 ENCOUNTER — Other Ambulatory Visit: Payer: Self-pay | Admitting: Endocrinology

## 2022-10-05 DIAGNOSIS — E1165 Type 2 diabetes mellitus with hyperglycemia: Secondary | ICD-10-CM

## 2022-10-06 ENCOUNTER — Other Ambulatory Visit: Payer: Self-pay

## 2022-10-06 DIAGNOSIS — E1165 Type 2 diabetes mellitus with hyperglycemia: Secondary | ICD-10-CM

## 2022-10-06 MED ORDER — METFORMIN HCL ER 500 MG PO TB24: ORAL_TABLET | ORAL | 2 refills | Status: DC

## 2022-10-22 ENCOUNTER — Other Ambulatory Visit: Payer: Self-pay

## 2022-10-22 DIAGNOSIS — E7800 Pure hypercholesterolemia, unspecified: Secondary | ICD-10-CM

## 2022-10-22 MED ORDER — ROSUVASTATIN CALCIUM 10 MG PO TABS: ORAL_TABLET | ORAL | 3 refills | Status: DC

## 2022-11-16 ENCOUNTER — Other Ambulatory Visit (INDEPENDENT_AMBULATORY_CARE_PROVIDER_SITE_OTHER): Payer: BC Managed Care – PPO

## 2022-11-16 DIAGNOSIS — Z794 Long term (current) use of insulin: Secondary | ICD-10-CM

## 2022-11-16 DIAGNOSIS — E1165 Type 2 diabetes mellitus with hyperglycemia: Secondary | ICD-10-CM

## 2022-11-16 LAB — BASIC METABOLIC PANEL
BUN: 24 mg/dL — ABNORMAL HIGH (ref 6–23)
CO2: 28 mEq/L (ref 19–32)
Calcium: 10.1 mg/dL (ref 8.4–10.5)
Chloride: 99 mEq/L (ref 96–112)
Creatinine, Ser: 1.46 mg/dL (ref 0.40–1.50)
GFR: 51.34 mL/min — ABNORMAL LOW (ref >60.00)
Glucose, Bld: 128 mg/dL — ABNORMAL HIGH (ref 70–99)
Potassium: 4.6 mEq/L (ref 3.5–5.1)
Sodium: 137 mEq/L (ref 135–145)

## 2022-11-17 LAB — FRUCTOSAMINE: Fructosamine: 293 umol/L — ABNORMAL HIGH (ref 0–285)

## 2022-11-18 ENCOUNTER — Ambulatory Visit: Payer: BC Managed Care – PPO | Admitting: Endocrinology

## 2022-11-18 ENCOUNTER — Encounter: Payer: Self-pay | Admitting: Endocrinology

## 2022-11-18 VITALS — BP 150/100 | HR 86 | Ht 72.0 in | Wt 305.0 lb

## 2022-11-18 DIAGNOSIS — E1165 Type 2 diabetes mellitus with hyperglycemia: Secondary | ICD-10-CM

## 2022-11-18 DIAGNOSIS — I10 Essential (primary) hypertension: Secondary | ICD-10-CM

## 2022-11-18 DIAGNOSIS — E7800 Pure hypercholesterolemia, unspecified: Secondary | ICD-10-CM

## 2022-11-18 DIAGNOSIS — Z794 Long term (current) use of insulin: Secondary | ICD-10-CM | POA: Diagnosis not present

## 2022-11-18 LAB — POCT GLYCOSYLATED HEMOGLOBIN (HGB A1C): Hemoglobin A1C: 7.3 % — AB (ref 4.0–5.6)

## 2022-11-18 NOTE — Progress Notes (Signed)
Patient ID: Jack Wallace, male   DOB: 1961-03-22, 61 y.o.   MRN: [id number]   Reason for Appointment : Followup     History of Present Illness            Diagnosis: Type 2 diabetes mellitus, date of diagnosis: 1993         Past diabetes history: He has been treated with various drugs for his diabetes over the last several years. He has been taking metformin for at least 10 years and has been tolerating this. Over the years he has had additional medications to improve his control. He was also taking Actos previously and not clear if he had any side effects. This was stopped because of fear of long-term effects  In early 2014 he had poor control before he started walking and lost 15 pounds. Subsequently he was getting low blood sugar during the day especially before lunch and sometimes in the afternoon His glipizide was  reduced but even with 5 mg twice a day he was getting hypoglycemia.  This was reduced and Invokana added. Invokana was stopped because of lack of clear benefit in 09/2013 Because of inadequate control he was started on Victoza on his visit in 10/14 in addition to his metformin and glipizide. In 12/2017 he was started on Ozempic instead of Victoza  Recent history:   Non-insulin hypoglycemic drugs: Metformin Er 1500 mg daily,  Jardiance 25 in pm, Amaryl 1 mg twice daily INSULIN regimen: Tresiba 22 units once daily at bedtime  His A1c is 7.3, was 8.1   Current problems, blood sugar patterns and management: He did not try the [facility] except for 1 dose of 2.5 mg in the last visit and did not ask for prescription However is blood sugars appear to be improving Without continuous glucose monitoring difficult to know what his postprandial patterns are especially after his main meal which is when he is eating after coming back from work in the morning  Recently has only a couple of readings after his meal at lunchtime at work Overall has only 1 significantly high  readings lately His weight is down slightly Blood sugars in the evenings after he wakes up are appearing to be excellent with his current dose of Tresiba 22 units Because of leg pains he is not doing any walking   Meals at work: 1-3 am, at home 9 am or 3 PM  Monitors blood glucose:  1 times a day        Glucometer:  Contour  Blood Glucose readings from meter review   PRE-MEAL 5 PM-12 AM  Lunch Dinner Bedtime Overall  Glucose range: 88-287  82-171    Mean/median: 144  120  137   POST-MEAL PC Breakfast PC Lunch PC Dinner  Glucose range:  151-199   Mean/median:      Prior   PRE-MEAL Fasting Lunch Dinner Bedtime Overall  Glucose range: 258      Mean/median:     154/30 days   POST-MEAL PC Breakfast PC Lunch PC Dinner  Glucose range: 117-257    Mean/median:        Dietician visit: Most recent: 5/19  Wt Readings from Last 3 Encounters:  11/18/22 (!) 305 lb (138.3 kg)  09/15/22 (!) 308 lb 3.2 oz (139.8 kg)  09/01/22 (!) 311 lb 6.4 oz (141.3 kg)      Lab Results  Component Value Date   HGBA1C 7.3 (A) 11/18/2022   HGBA1C 8.1 (H) 07/30/2022  HGBA1C 8.3 (A) 05/06/2022   Lab Results  Component Value Date   MICROALBUR 1.1 01/22/2022   LDLCALC 63 04/15/2021   CREATININE 1.46 11/16/2022   Lab Results  Component Value Date   FRUCTOSAMINE 293 (H) 11/16/2022   FRUCTOSAMINE 315 (H) 03/17/2022   FRUCTOSAMINE 294 (H) 02/24/2018    OTHER active problems: See review of systems   Allergies as of 11/18/2022   No Known Allergies      Medication List        Accurate as of November 18, 2022 11:41 AM. If you have any questions, ask your nurse or doctor.          albuterol 108 (90 Base) MCG/ACT inhaler Commonly known as: VENTOLIN HFA Inhale 2 puffs into the lungs every 6 (six) hours as needed for wheezing or shortness of breath.   Alpha Lipoic Acid 200 MG Caps Take 200 mg by mouth. 2 pills daily   amLODipine 5 MG tablet Commonly known as: NORVASC Take 1  tablet (5 mg total) by mouth daily.   aspirin 81 MG tablet Take 81 mg by mouth daily.   B-D UF III MINI PEN NEEDLES 31G X 5 MM Misc Generic drug: Insulin Pen Needle USE DAILY WITH INSULIN PENS   Bayer Microlet Lancets lancets Use to check blood sugar two times per day   CONTOUR NEXT EZ MONITOR w/Device Kit Use to check blood sugar two times per day   Contour Next Test test strip Generic drug: glucose blood TEST TWICE DAILY   diclofenac 75 MG EC tablet Commonly known as: VOLTAREN TAKE 1 TABLET BY MOUTH TWICE DAILY WITH FOOD   ferrous sulfate 325 (65 FE) MG tablet Take 1 tablet (325 mg total) by mouth every other day. On Monday, Wednesday, and Friday   fluticasone 50 MCG/ACT nasal spray Commonly known as: FLONASE Place 2 sprays into both nostrils daily.   FreeStyle Libre 3 Sensor Misc 1 Device by Does not apply route every 14 (fourteen) days. Apply 1 sensor on upper arm every 14 days for continuous glucose monitoring   gabapentin 600 MG tablet Commonly known as: NEURONTIN TAKE 1 TABLET(600 MG) BY MOUTH THREE TIMES DAILY   glimepiride 1 MG tablet Commonly known as: AMARYL Take 1 tablet before lunch and bedtime   Jardiance 25 MG Tabs tablet Generic drug: empagliflozin TAKE 1 TABLET BY MOUTH EVERY DAY   losartan 50 MG tablet Commonly known as: Cozaar Take 1 tablet (50 mg total) by mouth daily.   metFORMIN 500 MG 24 hr tablet Commonly known as: GLUCOPHAGE-XR TAKE 4 TABLETS BY MOUTH EVERY DAY WITH SUPPER   multivitamin with minerals Tabs tablet Take 1 tablet by mouth daily.   ondansetron 4 MG tablet Commonly known as: Zofran Take 1 tablet (4 mg total) by mouth 2 (two) times daily as needed for nausea or vomiting.   pantoprazole 40 MG tablet Commonly known as: PROTONIX TAKE 1 TABLET(40 MG) BY MOUTH TWICE DAILY   polyethylene glycol 17 g packet Commonly known as: MiraLax Take 17 g by mouth daily.   rosuvastatin 10 MG tablet Commonly known as: CRESTOR TAKE  1 TABLET(10 MG) BY MOUTH DAILY   sertraline 100 MG tablet Commonly known as: ZOLOFT Take 2 tablets (200 mg total) by mouth daily.   Tyler Aas FlexTouch 100 UNIT/ML FlexTouch Pen Generic drug: insulin degludec ADMINISTER UP TO 22 UNITS UNDER THE SKIN DAILY   vitamin A 25000 UNIT capsule Take 25,000 Units by mouth daily.   vitamin B-12 500  MCG tablet Commonly known as: CYANOCOBALAMIN Take 500 mcg by mouth daily.   vitamin C 1000 MG tablet Take 1,000 mg by mouth daily.   VITAMIN D PO Take 1 tablet by mouth daily. D3 1000 IU        Allergies: No Known Allergies  Past Medical History:  Diagnosis Date   Depression    Diabetes mellitus without complication ([place])    Hyperlipidemia    Hypertension    Ischemic colitis [facility])     Past Surgical History:  Procedure Laterality Date   COLONOSCOPY  06/01/2014   NASAL SEPTUM SURGERY      Family History  Problem Relation Age of Onset   Hypertension Mother    Stroke Father    Diabetes Maternal Grandfather    Colon cancer Neg Hx    Throat cancer Neg Hx    Prostate cancer Neg Hx    Pancreatic cancer Neg Hx    Heart disease Neg Hx    Kidney disease Neg Hx    Liver disease Neg Hx     Social History:  reports that he has never smoked. He has never used smokeless tobacco. He reports that he does not drink alcohol and does not use drugs.    Review of Systems      HYPERTENSION: Taking 50 mg losartan and Norvasc from his PCP now Also on Jardiance He will check blood pressure at work regularly: His blood pressure usually runs about 130-140s Repeat blood pressure 160/98 standing Today he says he had car trouble this morning and is stressed, blood pressure being monitored by his PCP  BP Readings from Last 3 Encounters:  11/18/22 (!) 150/100  09/15/22 (!) 138/94  09/01/22 (!) 142/87   Renal function as follows: He does not take any ibuprofen or OTC naproxen  Lab Results  Component Value Date   CREATININE 1.46 11/16/2022    CREATININE 1.13 09/01/2022   CREATININE 1.72 (H) 08/26/2022        Hyperlipidemia: taking CRESTOR 10 mg LDL is below 100 and triglycerides are still high, previously LDL was higher with pravastatin   Lab Results  Component Value Date   CHOL 151 07/30/2022   HDL 41.60 07/30/2022   LDLCALC 63 04/15/2021   LDLDIRECT 85.0 07/30/2022   TRIG 237.0 (H) 07/30/2022   CHOLHDL 4 07/30/2022     Lab Results  Component Value Date   ALT 15 01/22/2022     NEUROPATHY He has had symptoms of neuropathy in feet, including burning  He was tried on Lyrica 150 mg but he does not think this works any better than gabapentin He previously was on gabapentin 600 mg 3 capsules a day without adequate control  Had a normal eye exam and was seen by ophthalmologist in 5/23  Foot exam done in 11/22   Physical Examination:  BP (!) 150/100   Pulse 86   Ht 6' (1.829 m)   Wt (!) 305 lb (138.3 kg)   SpO2 98%   BMI 41.37 kg/m     ASSESSMENT/PLAN:   Diabetes type 2 with obesity:  See history of present illness for detailed discussion of current diabetes management, blood sugar patterns and problems identified   A1c is improved at 7.3  Oral medications: Amaryl 1 mg before lunch and bedtime, along with Jardiance/metformin  Insulin: Tresiba 22 units daily  Blood sugars are improved and mostly in the target range at all times that he has checked Day-to-day management was reviewed in detail  Plan:  Continue 22 units of Tresiba Consider Mounjaro if postprandial readings significantly high when checked More blood sugars after meals Resume to start some activity for exercise, planning to be seen by physical therapy  RENAL dysfunction: His creatinine is somewhat variable but improving with reducing losartan  HYPERLIPIDEMIA: Triglycerides will need to be repeated  HYPERTENSION: Blood pressure is higher in the office and further increased on second measurement likely anxiety today along with  whitecoat syndrome He will continue to monitor at home or work and report high readings to his PCP  There are no Patient Instructions on file for this visit.     Total visit time including counseling = 30 minutes  Elayne Snare 11/18/2022, 11:41 AM

## 2022-12-23 ENCOUNTER — Ambulatory Visit: Payer: BC Managed Care – PPO | Admitting: Student

## 2022-12-23 ENCOUNTER — Encounter: Payer: Self-pay | Admitting: Student

## 2022-12-23 ENCOUNTER — Other Ambulatory Visit: Payer: Self-pay

## 2022-12-23 DIAGNOSIS — M79604 Pain in right leg: Secondary | ICD-10-CM | POA: Diagnosis not present

## 2022-12-23 DIAGNOSIS — E1142 Type 2 diabetes mellitus with diabetic polyneuropathy: Secondary | ICD-10-CM | POA: Diagnosis not present

## 2022-12-23 DIAGNOSIS — M79605 Pain in left leg: Secondary | ICD-10-CM | POA: Diagnosis not present

## 2022-12-23 DIAGNOSIS — I10 Essential (primary) hypertension: Secondary | ICD-10-CM

## 2022-12-23 DIAGNOSIS — F321 Major depressive disorder, single episode, moderate: Secondary | ICD-10-CM

## 2022-12-23 DIAGNOSIS — E1159 Type 2 diabetes mellitus with other circulatory complications: Secondary | ICD-10-CM

## 2022-12-23 DIAGNOSIS — I152 Hypertension secondary to endocrine disorders: Secondary | ICD-10-CM | POA: Diagnosis not present

## 2022-12-23 MED ORDER — SERTRALINE HCL 100 MG PO TABS: 200.0000 mg | ORAL_TABLET | Freq: Every day | ORAL | 0 refills | Status: DC

## 2022-12-23 MED ORDER — FREESTYLE LIBRE 3 READER DEVI
1.0000 | Freq: Every day | 0 refills | Status: DC
Start: 2022-12-23 — End: 2023-01-15

## 2022-12-23 NOTE — Assessment & Plan Note (Signed)
Doing well on a stable dose of sertraline.  Needs a refill.  Happy to refill today.

## 2022-12-23 NOTE — Patient Instructions (Addendum)
Today we discussed hypertension and leg cramping.  Between now and your next visit, log your blood pressure measurements. Measure your blood pressure during times when you are not feeling stressed or anxious. Consider measuring it before you sit down for a meal, or shortly before bedtime, for instance. Sit for 5 minutes with your feet resting on the ground prior to measuring.  Return in 3 months for routine follow up of hypertension.   Please call our clinic at [phone] Monday through Friday from 9 am to 4 pm if you have questions or concerns about your health. If after hours or on the weekend, call the main hospital number and ask for the Internal Medicine Resident On-Call. If you need medication refills, please notify your pharmacy one week in advance and they will send [place] a request.   Best, Nani Gasser, [place]

## 2022-12-23 NOTE — Progress Notes (Addendum)
Subjective:  [name] is a 62 y.o. who presents to clinic for concerns of high blood pressure and leg cramping.  At a recent endocrinology appointment his blood pressure was elevated to 694 systolic.  He checks his blood pressure occasionally when he is at work or when he feels badly and reports a systolic number of 854-627.  Occasionally he has a headache associated with high blood pressure.  He denies visual changes.  He denies chest pain, shortness of breath, orthopnea, PND.  Regarding his legs, they have 8 with walking for about a year.  It tends to start around the knees and spread to the ankles.  It is worse in the muscles rather than joints or bones.  Both legs are equally affected.  He has tried iron supplementation which did not help.  He has had ABIs in the past which were normal.  Patient Active Problem List   Diagnosis Date Noted   AKI (acute kidney injury) ([place]) 09/02/2022   Bilateral leg pain 07/03/2022   Hyperkalemia 01/07/2022   Pain of joint of left ankle and foot 04/01/2018   MDD (major depressive disorder) 11/13/2015   GERD (gastroesophageal reflux disease) 08/20/2015   Chronic nausea 03/29/2015   Preventative health care 08/23/2014   Hypomagnesemia 05/23/2014   Diabetic neuropathy ([place]) 08/01/2013   Hypertension associated with diabetes ([place]) 07/31/2013   Hyperlipidemia 07/31/2013   Type 2 diabetes mellitus ([place]) 07/31/1992   Objective:   Vitals:   12/23/22 0849 12/23/22 0858  BP: 124/81 105/79  Pulse: (!) 104 76  Resp: (!) 28   Temp: 98.8 F (37.1 C)   TempSrc: Oral   SpO2: 95%   Weight: (!) 310 lb 9.6 oz (140.9 kg)   Height: 6' (1.829 m)     Physical Exam Patient sitting in chair in exam room, in no apparent distress Heart rate and rhythm are regular, radial pulses 2+, right dorsalis pedis pulse 2+, left dorsalis pedis diminished, bilateral pedal capillary refill is brisk Breathing is regular and unlabored, lung sounds are clear to  auscultation Skin is warm and dry, legs are smooth and hairless Small ulcerated lesion with dry scab on medial aspect of left heel Large pannus present Alert and oriented, bilateral patellar reflexes 2+, left Achilles reflex 1+, right Achilles reflex diminished Pleasant, mood and affect are concordant  Assessment & Plan:  The primary encounter diagnosis was Hypomagnesemia. Diagnoses of Essential hypertension, Current moderate episode of major depressive disorder without prior episode ([place]), Bilateral leg pain, Type 2 diabetes mellitus with diabetic polyneuropathy, without long-term current use of insulin ([place]), and Hypertension associated with diabetes ([place]) were also pertinent to this visit.  Hypertension associated with diabetes ([place]) BP in clinic today is 105/79.  Patient reports that on the way to his recent endocrinology appointment his car broke down, then he had to wait in the waiting room for an hour.  He reports that he was very stressed at that time.  When he measures his blood pressure at work, it is usually during active periods of his day when he is up and walking around.  This patient has had a complication of low blood pressure in the past-in 2017 he had ischemic colitis.  I recommended that this patient start checking his blood pressure at least twice a day.  I advised that he check his blood pressure after 5 minutes of sitting in a chair with his back supported in his feet flat on the ground.  I recommended  that he stop checking his blood pressure when he is stressed, anxious, or not feeling well.  Because of his history of complications with hypotension and a normal clinic blood pressure today, I will not make any changes to the doses of his antihypertensives.  At follow-up, check the blood pressure log but the patient was encouraged to keep and use that data to guide changes in therapy.  Bilateral leg pain Patient continues to have bilateral lower leg muscular cramping.  Workup  thus far has not revealed an obvious cause.  Iron deficiency anemia was treated previously with no improvement.  ABIs did not show evidence of decreased pulse pressures in the ankles.  His exam is reassuring with brisk capillary refill in both feet.  Patient's symptoms are suggestive of claudication, which would not be surprising given his age and risk factors for peripheral arterial disease.  However, ABIs have not shown discrepancies in upper and lower extremity pulse pressures.  Differential diagnosis includes neurogenic claudication.  I will obtain a BMP with magnesium level today as he has a history of hypomagnesemia, which can also cause muscle cramping.  MDD (major depressive disorder) Doing well on a stable dose of sertraline.  Needs a refill.  Happy to refill today.    Return in 3 months for routine follow up of hypertension.  Patient discussed with Dr. Loree Fee MD 12/23/2022, 4:09 PM  Pager: [phone]

## 2022-12-23 NOTE — Assessment & Plan Note (Signed)
BP in clinic today is 105/79.  Patient reports that on the way to his recent endocrinology appointment his car broke down, then he had to wait in the waiting room for an hour.  He reports that he was very stressed at that time.  When he measures his blood pressure at work, it is usually during active periods of his day when he is up and walking around.  This patient has had a complication of low blood pressure in the past-in 2017 he had ischemic colitis.  I recommended that this patient start checking his blood pressure at least twice a day.  I advised that he check his blood pressure after 5 minutes of sitting in a chair with his back supported in his feet flat on the ground.  I recommended that he stop checking his blood pressure when he is stressed, anxious, or not feeling well.  Because of his history of complications with hypotension and a normal clinic blood pressure today, I will not make any changes to the doses of his antihypertensives.  At follow-up, check the blood pressure log but the patient was encouraged to keep and use that data to guide changes in therapy.

## 2022-12-23 NOTE — Assessment & Plan Note (Signed)
Patient continues to have bilateral lower leg muscular cramping.  Workup thus far has not revealed an obvious cause.  Iron deficiency anemia was treated previously with no improvement.  ABIs did not show evidence of decreased pulse pressures in the ankles.  His exam is reassuring with brisk capillary refill in both feet.  Patient's symptoms are suggestive of claudication, which would not be surprising given his age and risk factors for peripheral arterial disease.  However, ABIs have not shown discrepancies in upper and lower extremity pulse pressures.  Differential diagnosis includes neurogenic claudication.  I will obtain a BMP with magnesium level today as he has a history of hypomagnesemia, which can also cause muscle cramping.  Addendum 12/24/2022: Labs notable for mild hypomagnesemia.  Will replace with magnesium oxide 400 mg p.o. daily

## 2022-12-24 ENCOUNTER — Other Ambulatory Visit: Payer: Self-pay | Admitting: Endocrinology

## 2022-12-24 DIAGNOSIS — E1165 Type 2 diabetes mellitus with hyperglycemia: Secondary | ICD-10-CM

## 2022-12-24 LAB — BMP8+ANION GAP
Anion Gap: 18 mmol/L (ref 10.0–18.0)
BUN/Creatinine Ratio: 20 (ref 10–24)
BUN: 25 mg/dL (ref 8–27)
CO2: 22 mmol/L (ref 20–29)
Calcium: 10.1 mg/dL (ref 8.6–10.2)
Chloride: 98 mmol/L (ref 96–106)
Creatinine, Ser: 1.28 mg/dL — ABNORMAL HIGH (ref 0.76–1.27)
Glucose: 138 mg/dL — ABNORMAL HIGH (ref 70–99)
Potassium: 4.6 mmol/L (ref 3.5–5.2)
Sodium: 138 mmol/L (ref 134–144)
eGFR: 63 mL/min/{1.73_m2} (ref >59)

## 2022-12-24 LAB — MAGNESIUM: Magnesium: 1.5 mg/dL — ABNORMAL LOW (ref 1.6–2.3)

## 2022-12-24 MED ORDER — MAGNESIUM OXIDE 400 MG PO CAPS: 400.0000 mg | ORAL_CAPSULE | Freq: Every day | ORAL | 0 refills | Status: AC

## 2022-12-24 NOTE — Progress Notes (Signed)
Mild hypomagnesemia.  Stable BMP.  Recommend magnesium supplementation given leg cramping.  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

## 2022-12-24 NOTE — Addendum Note (Signed)
Addended by: Nani Gasser on: 12/24/2022 03:06 PM   Modules accepted: Orders

## 2022-12-31 NOTE — Progress Notes (Signed)
Internal Medicine Clinic Attending  Case discussed with Dr. McLendon  At the time of the visit.  We reviewed the resident's history and exam and pertinent patient test results.  I agree with the assessment, diagnosis, and plan of care documented in the resident's note.  

## 2023-01-13 ENCOUNTER — Other Ambulatory Visit (INDEPENDENT_AMBULATORY_CARE_PROVIDER_SITE_OTHER): Payer: BC Managed Care – PPO

## 2023-01-13 DIAGNOSIS — E1165 Type 2 diabetes mellitus with hyperglycemia: Secondary | ICD-10-CM | POA: Diagnosis not present

## 2023-01-13 DIAGNOSIS — E7800 Pure hypercholesterolemia, unspecified: Secondary | ICD-10-CM | POA: Diagnosis not present

## 2023-01-13 DIAGNOSIS — Z794 Long term (current) use of insulin: Secondary | ICD-10-CM | POA: Diagnosis not present

## 2023-01-13 LAB — LIPID PANEL
Cholesterol: 146 mg/dL (ref 0–200)
HDL: 43.1 mg/dL (ref >39.00)
LDL Cholesterol: 68 mg/dL (ref 0–99)
NonHDL: 103.21
Total CHOL/HDL Ratio: 3
Triglycerides: 176 mg/dL — ABNORMAL HIGH (ref 0.0–149.0)
VLDL: 35.2 mg/dL (ref 0.0–40.0)

## 2023-01-13 LAB — COMPREHENSIVE METABOLIC PANEL
ALT: 14 U/L (ref 0–53)
AST: 17 U/L (ref 0–37)
Albumin: 4.7 g/dL (ref 3.5–5.2)
Alkaline Phosphatase: 54 U/L (ref 39–117)
BUN: 22 mg/dL (ref 6–23)
CO2: 26 mEq/L (ref 19–32)
Calcium: 9.8 mg/dL (ref 8.4–10.5)
Chloride: 102 mEq/L (ref 96–112)
Creatinine, Ser: 1.34 mg/dL (ref 0.40–1.50)
GFR: 56.84 mL/min — ABNORMAL LOW (ref >60.00)
Glucose, Bld: 90 mg/dL (ref 70–99)
Potassium: 4.2 mEq/L (ref 3.5–5.1)
Sodium: 139 mEq/L (ref 135–145)
Total Bilirubin: 0.7 mg/dL (ref 0.2–1.2)
Total Protein: 7.9 g/dL (ref 6.0–8.3)

## 2023-01-13 LAB — HEMOGLOBIN A1C: Hgb A1c MFr Bld: 7.9 % — ABNORMAL HIGH (ref 4.6–6.5)

## 2023-01-15 ENCOUNTER — Encounter: Payer: Self-pay | Admitting: Endocrinology

## 2023-01-15 ENCOUNTER — Ambulatory Visit: Payer: BC Managed Care – PPO | Admitting: Endocrinology

## 2023-01-15 VITALS — BP 140/86 | HR 85 | Ht 72.0 in | Wt 309.6 lb

## 2023-01-15 DIAGNOSIS — Z794 Long term (current) use of insulin: Secondary | ICD-10-CM

## 2023-01-15 DIAGNOSIS — E1165 Type 2 diabetes mellitus with hyperglycemia: Secondary | ICD-10-CM

## 2023-01-15 DIAGNOSIS — N289 Disorder of kidney and ureter, unspecified: Secondary | ICD-10-CM | POA: Diagnosis not present

## 2023-01-15 DIAGNOSIS — I10 Essential (primary) hypertension: Secondary | ICD-10-CM

## 2023-01-15 MED ORDER — TIRZEPATIDE 2.5 MG/0.5ML ~~LOC~~ SOAJ: 2.5000 mg | SUBCUTANEOUS | 0 refills | Status: DC

## 2023-01-15 MED ORDER — FREESTYLE LIBRE 3 READER DEVI: 1.0000 | Freq: Every day | 0 refills | Status: DC

## 2023-01-15 MED ORDER — FREESTYLE LIBRE 3 SENSOR MISC: 1.0000 | 2 refills | Status: DC

## 2023-01-15 NOTE — Progress Notes (Signed)
Patient ID: Jack Wallace, male   DOB: [birth year], 62 y.o.   MRN: [id number]   Reason for Appointment : Followup     History of Present Illness            Diagnosis: Type 2 diabetes mellitus, date of diagnosis: 1993         Past diabetes history: He has been treated with various drugs for his diabetes over the last several years. He has been taking metformin for at least 10 years and has been tolerating this. Over the years he has had additional medications to improve his control. He was also taking Actos previously and not clear if he had any side effects. This was stopped because of fear of long-term effects  In early 2014 he had poor control before he started walking and lost 15 pounds. Subsequently he was getting low blood sugar during the day especially before lunch and sometimes in the afternoon His glipizide was  reduced but even with 5 mg twice a day he was getting hypoglycemia.  This was reduced and Invokana added. Invokana was stopped because of lack of clear benefit in 09/2013 Because of inadequate control he was started on Victoza on his visit in 10/14 in addition to his metformin and glipizide. In 12/2017 he was started on Ozempic instead of Victoza  Recent history:   Non-insulin hypoglycemic drugs: Metformin Er 1500 mg daily,  Jardiance 25 in pm, Amaryl 1 mg twice daily INSULIN regimen: Tresiba 22 units once daily at bedtime  His A1c is 7.9 and higher   Current problems, blood sugar patterns and management: He still has not been able to get his blood sugars consistently controlled and they appear overall higher now However blood sugars do fluctuate and as low as 90 in the lab They are lowest at bedtime and seem to be highest before lunch at work Not doing postprandial readings and is reminded before Apparently his phone is not compatible with the [place] [employer] app  also not clear if this is covered by his insurance Because of leg pains he is not doing any  walking   Meals at work: 1-3 am, at home 9 am or 3 PM  Monitors blood glucose:  1 times a day        Glucometer:  Contour  Blood Glucose readings from meter review   PRE-MEAL 10 PM Lunch Dinner 8-10 AM Overall  Glucose range: 97-204 137-192  111-200   Mean/median: 158   147 155   POST-MEAL PC Breakfast PC Lunch PC Dinner  Glucose range:   ?  Mean/median:      Previously:  PRE-MEAL 5 PM-12 AM  Lunch Dinner Bedtime Overall  Glucose range: 88-287  82-171    Mean/median: 144  120  137   POST-MEAL PC Breakfast PC Lunch PC Dinner  Glucose range:  151-199   Mean/median:       Dietician visit: Most recent: 5/19  Wt Readings from Last 3 Encounters:  01/15/23 (!) 309 lb 9.6 oz (140.4 kg)  12/23/22 (!) 310 lb 9.6 oz (140.9 kg)  11/18/22 (!) 305 lb (138.3 kg)      Lab Results  Component Value Date   HGBA1C 7.9 (H) 01/13/2023   HGBA1C 7.3 (A) 11/18/2022   HGBA1C 8.1 (H) 07/30/2022   Lab Results  Component Value Date   MICROALBUR 1.1 01/22/2022   LDLCALC 68 01/13/2023   CREATININE 1.34 01/13/2023   Lab Results  Component Value Date  FRUCTOSAMINE 293 (H) 11/16/2022   FRUCTOSAMINE 315 (H) 03/17/2022   FRUCTOSAMINE 294 (H) 02/24/2018    OTHER active problems: See review of systems   Allergies as of 01/15/2023   No Known Allergies      Medication List        Accurate as of January 15, 2023  8:14 AM. If you have any questions, ask your nurse or doctor.          albuterol 108 (90 Base) MCG/ACT inhaler Commonly known as: VENTOLIN HFA Inhale 2 puffs into the lungs every 6 (six) hours as needed for wheezing or shortness of breath.   Alpha Lipoic Acid 200 MG Caps Take 200 mg by mouth. 2 pills daily   amLODipine 5 MG tablet Commonly known as: NORVASC Take 1 tablet (5 mg total) by mouth daily.   aspirin 81 MG tablet Take 81 mg by mouth daily.   B-D UF III MINI PEN NEEDLES 31G X 5 MM Misc Generic drug: Insulin Pen Needle USE DAILY WITH INSULIN PENS    Bayer Microlet Lancets lancets Use to check blood sugar two times per day   CONTOUR NEXT EZ MONITOR w/Device Kit Use to check blood sugar two times per day   Contour Next Test test strip Generic drug: glucose blood TEST TWICE DAILY   diclofenac 75 MG EC tablet Commonly known as: VOLTAREN TAKE 1 TABLET BY MOUTH TWICE DAILY WITH FOOD   ferrous sulfate 325 (65 FE) MG tablet Take 1 tablet (325 mg total) by mouth every other day. On Monday, Wednesday, and Friday   fluticasone 50 MCG/ACT nasal spray Commonly known as: FLONASE Place 2 sprays into both nostrils daily.   FreeStyle Libre 3 Reader Devi 1 Device by Does not apply route daily.   FreeStyle Libre 3 Sensor Misc 1 Device by Does not apply route every 14 (fourteen) days. Apply 1 sensor on upper arm every 14 days for continuous glucose monitoring   gabapentin 600 MG tablet Commonly known as: NEURONTIN TAKE 1 TABLET(600 MG) BY MOUTH THREE TIMES DAILY   glimepiride 1 MG tablet Commonly known as: AMARYL Take 1 tablet before lunch and bedtime   Jardiance 25 MG Tabs tablet Generic drug: empagliflozin TAKE 1 TABLET BY MOUTH EVERY DAY   losartan 50 MG tablet Commonly known as: Cozaar Take 1 tablet (50 mg total) by mouth daily.   Magnesium Oxide 400 MG Caps Take 1 capsule (400 mg total) by mouth daily.   metFORMIN 500 MG 24 hr tablet Commonly known as: GLUCOPHAGE-XR TAKE 4 TABLETS BY MOUTH EVERY DAY WITH SUPPER   multivitamin with minerals Tabs tablet Take 1 tablet by mouth daily.   ondansetron 4 MG tablet Commonly known as: Zofran Take 1 tablet (4 mg total) by mouth 2 (two) times daily as needed for nausea or vomiting.   pantoprazole 40 MG tablet Commonly known as: PROTONIX TAKE 1 TABLET(40 MG) BY MOUTH TWICE DAILY   polyethylene glycol 17 g packet Commonly known as: MiraLax Take 17 g by mouth daily.   rosuvastatin 10 MG tablet Commonly known as: CRESTOR TAKE 1 TABLET(10 MG) BY MOUTH DAILY   sertraline  100 MG tablet Commonly known as: ZOLOFT Take 2 tablets (200 mg total) by mouth daily.   Tyler Aas FlexTouch 100 UNIT/ML FlexTouch Pen Generic drug: insulin degludec ADMINISTER UP TO 22 UNITS UNDER THE SKIN DAILY   vitamin A 25000 UNIT capsule Take 25,000 Units by mouth daily.   vitamin B-12 500 MCG tablet Commonly known as:  CYANOCOBALAMIN Take 500 mcg by mouth daily.   vitamin C 1000 MG tablet Take 1,000 mg by mouth daily.   VITAMIN D PO Take 1 tablet by mouth daily. D3 1000 IU        Allergies: No Known Allergies  Past Medical History:  Diagnosis Date   Depression    Diabetes mellitus without complication ([place])    Hyperlipidemia    Hypertension    Ischemic colitis [facility])     Past Surgical History:  Procedure Laterality Date   COLONOSCOPY  06/01/2014   NASAL SEPTUM SURGERY      Family History  Problem Relation Age of Onset   Hypertension Mother    Stroke Father    Diabetes Maternal Grandfather    Colon cancer Neg Hx    Throat cancer Neg Hx    Prostate cancer Neg Hx    Pancreatic cancer Neg Hx    Heart disease Neg Hx    Kidney disease Neg Hx    Liver disease Neg Hx     Social History:  reports that he has never smoked. He has never used smokeless tobacco. He reports that he does not drink alcohol and does not use drugs.    Review of Systems      HYPERTENSION: Taking 50 mg losartan and Norvasc from his PCP now Also on Jardiance He will check blood pressure at work regularly: His blood pressure usually runs about 120-130 Recently his home meter is reading higher  Blood pressure was relatively better with his PCP and no changes made  BP Readings from Last 3 Encounters:  01/15/23 (!) 140/86  12/23/22 105/79  11/18/22 (!) 150/100   Renal function as follows: He does not take any ibuprofen or OTC naproxen  Lab Results  Component Value Date   CREATININE 1.34 01/13/2023   CREATININE 1.28 (H) 12/23/2022   CREATININE 1.46 11/16/2022         Hyperlipidemia: taking CRESTOR 10 mg LDL is below 100 and triglycerides are still high but improved, previously LDL was higher with pravastatin  Lab Results  Component Value Date   CHOL 146 01/13/2023   CHOL 151 07/30/2022   CHOL 131 01/22/2022   Lab Results  Component Value Date   HDL 43.10 01/13/2023   HDL 41.60 07/30/2022   HDL 40.90 01/22/2022   Lab Results  Component Value Date   LDLCALC 68 01/13/2023   LDLCALC 63 04/15/2021   LDLCALC 73 08/09/2020   Lab Results  Component Value Date   TRIG 176.0 (H) 01/13/2023   TRIG 237.0 (H) 07/30/2022   TRIG 201.0 (H) 01/22/2022   Lab Results  Component Value Date   CHOLHDL 3 01/13/2023   CHOLHDL 4 07/30/2022   CHOLHDL 3 01/22/2022   Lab Results  Component Value Date   LDLDIRECT 85.0 07/30/2022   LDLDIRECT 69.0 01/22/2022   LDLDIRECT 106.0 10/16/2021      Lab Results  Component Value Date   ALT 14 01/13/2023     NEUROPATHY He has had symptoms of neuropathy in feet, including burning  He was tried on Lyrica 150 mg but he does not think this works any better than gabapentin He previously was on gabapentin 600 mg 3 capsules a day without adequate control  Had a normal eye exam and was seen by ophthalmologist in 5/23  Foot exam done in 11/22   Physical Examination:  BP (!) 140/86 (BP Location: Left Arm, Patient Position: Sitting, Cuff Size: Normal)   Pulse 85  Ht 6' (1.829 m)   Wt (!) 309 lb 9.6 oz (140.4 kg)   SpO2 95%   BMI 41.99 kg/m   Repeat blood pressure 142/86 with large cuff standing No edema  ASSESSMENT/PLAN:   Diabetes type 2 with obesity:  See history of present illness for detailed discussion of current diabetes management, blood sugar patterns and problems identified   A1c is back up to 7.9, previously was at 7.3  Oral medications: Amaryl 1 mg before lunch and bedtime, along with Jardiance/metformin  Insulin: Tresiba 22 units daily  Blood sugars are again mostly being checked fasting  and bedtime and not clear if he has high postprandial readings With his working night shift difficult to analyze his blood sugar patterns also He has relatively high readings at 10 PM when he mostly wakes up and likely needs higher doses of [place]  Also again may benefit from [place] especially with need for significant weight loss Currently not exercising   Plan: Trial of Mounjaro 2.5 mg weekly Patient brochure given and reminded him how to take this If fasting readings continue to stay 130 he can go 2 to 4 units on the [place] If doing okay with 2.5 mg Mounjaro he can let us know about going to 5 mg Also consider follow-up with nutritionist  RENAL dysfunction: His creatinine is somewhat variable but improving  HYPERLIPIDEMIA: Triglycerides only mildly increased now  HYPERTENSION: Blood pressure is higher in the office as before but he says it is better with the monitor at work and was okay with PCP recently He will take his blood pressure monitor to his PCP or bring it here to be compared  There are no Patient Instructions on file for this visit.     Total visit time including counseling = 30 minutes  Elayne Snare 01/15/2023, 8:14 AM

## 2023-01-15 NOTE — Patient Instructions (Addendum)
Mounjaro 2.5 weekly then ask for '5mg'$   Tresiba 24-26 to keep 10 pm sugars <130  Check blood sugars on waking up 3-4 days a week  Also check blood sugars about 2 hours after meals and do this after different meals by rotation  Recommended blood sugar levels on waking up are 90-130 and about 2 hours after meal is 130-160  Please bring your blood sugar monitor to each visit, thank you

## 2023-01-24 ENCOUNTER — Encounter: Payer: Self-pay | Admitting: Endocrinology

## 2023-01-25 ENCOUNTER — Encounter: Payer: Self-pay | Admitting: Endocrinology

## 2023-01-25 ENCOUNTER — Other Ambulatory Visit: Payer: Self-pay | Admitting: Endocrinology

## 2023-02-09 ENCOUNTER — Other Ambulatory Visit: Payer: Self-pay

## 2023-02-09 DIAGNOSIS — F321 Major depressive disorder, single episode, moderate: Secondary | ICD-10-CM

## 2023-02-09 MED ORDER — SERTRALINE HCL 100 MG PO TABS: 200.0000 mg | ORAL_TABLET | Freq: Every day | ORAL | 0 refills | Status: DC

## 2023-02-16 ENCOUNTER — Other Ambulatory Visit: Payer: Self-pay | Admitting: Endocrinology

## 2023-02-16 DIAGNOSIS — E7800 Pure hypercholesterolemia, unspecified: Secondary | ICD-10-CM

## 2023-02-23 ENCOUNTER — Other Ambulatory Visit: Payer: Self-pay

## 2023-02-23 MED ORDER — EMPAGLIFLOZIN 25 MG PO TABS: 25.0000 mg | ORAL_TABLET | Freq: Every day | ORAL | 2 refills | Status: DC

## 2023-03-22 ENCOUNTER — Other Ambulatory Visit: Payer: Self-pay

## 2023-03-22 DIAGNOSIS — E1165 Type 2 diabetes mellitus with hyperglycemia: Secondary | ICD-10-CM

## 2023-03-22 MED ORDER — TRESIBA FLEXTOUCH 100 UNIT/ML ~~LOC~~ SOPN: 22.0000 [IU] | PEN_INJECTOR | Freq: Every day | SUBCUTANEOUS | 3 refills | Status: DC

## 2023-03-24 ENCOUNTER — Encounter: Payer: BC Managed Care – PPO | Admitting: Student

## 2023-04-06 ENCOUNTER — Other Ambulatory Visit: Payer: Self-pay

## 2023-04-06 DIAGNOSIS — E1142 Type 2 diabetes mellitus with diabetic polyneuropathy: Secondary | ICD-10-CM

## 2023-04-06 MED ORDER — GABAPENTIN 600 MG PO TABS: ORAL_TABLET | ORAL | 1 refills | Status: DC

## 2023-04-06 MED ORDER — CONTOUR NEXT TEST VI STRP: ORAL_STRIP | 2 refills | Status: DC

## 2023-04-06 NOTE — Addendum Note (Signed)
Addended by: Kenyon Ana on: 04/06/2023 08:33 AM   Modules accepted: Orders

## 2023-04-07 ENCOUNTER — Other Ambulatory Visit (INDEPENDENT_AMBULATORY_CARE_PROVIDER_SITE_OTHER): Payer: BC Managed Care – PPO

## 2023-04-07 DIAGNOSIS — E1165 Type 2 diabetes mellitus with hyperglycemia: Secondary | ICD-10-CM

## 2023-04-07 DIAGNOSIS — Z794 Long term (current) use of insulin: Secondary | ICD-10-CM | POA: Diagnosis not present

## 2023-04-07 LAB — BASIC METABOLIC PANEL
BUN: 30 mg/dL — ABNORMAL HIGH (ref 6–23)
CO2: 28 mEq/L (ref 19–32)
Calcium: 9.7 mg/dL (ref 8.4–10.5)
Chloride: 98 mEq/L (ref 96–112)
Creatinine, Ser: 1.41 mg/dL (ref 0.40–1.50)
GFR: 53.39 mL/min — ABNORMAL LOW (ref >60.00)
Glucose, Bld: 125 mg/dL — ABNORMAL HIGH (ref 70–99)
Potassium: 4.1 mEq/L (ref 3.5–5.1)
Sodium: 137 mEq/L (ref 135–145)

## 2023-04-07 LAB — HEMOGLOBIN A1C: Hgb A1c MFr Bld: 7.6 % — ABNORMAL HIGH (ref 4.6–6.5)

## 2023-04-09 ENCOUNTER — Ambulatory Visit: Payer: BC Managed Care – PPO | Admitting: Endocrinology

## 2023-04-09 ENCOUNTER — Encounter: Payer: Self-pay | Admitting: Endocrinology

## 2023-04-09 VITALS — BP 122/84 | HR 95 | Ht 72.0 in | Wt 304.0 lb

## 2023-04-09 DIAGNOSIS — I10 Essential (primary) hypertension: Secondary | ICD-10-CM

## 2023-04-09 DIAGNOSIS — E1165 Type 2 diabetes mellitus with hyperglycemia: Secondary | ICD-10-CM

## 2023-04-09 DIAGNOSIS — E1142 Type 2 diabetes mellitus with diabetic polyneuropathy: Secondary | ICD-10-CM

## 2023-04-09 DIAGNOSIS — Z794 Long term (current) use of insulin: Secondary | ICD-10-CM

## 2023-04-09 DIAGNOSIS — E782 Mixed hyperlipidemia: Secondary | ICD-10-CM

## 2023-04-09 NOTE — Progress Notes (Signed)
Patient ID: Jack Wallace, male   DOB: 14-Aug-1961, 62 y.o.   MRN: [id number]   Reason for Appointment : Followup     History of Present Illness            Diagnosis: Type 2 diabetes mellitus, date of diagnosis: 1993         Past diabetes history: He has been treated with various drugs for his diabetes over the last several years. He has been taking metformin for at least 10 years and has been tolerating this. Over the years he has had additional medications to improve his control. He was also taking Actos previously and not clear if he had any side effects. This was stopped because of fear of long-term effects  In early 2014 he had poor control before he started walking and lost 15 pounds. Subsequently he was getting low blood sugar during the day especially before lunch and sometimes in the afternoon His glipizide was  reduced but even with 5 mg twice a day he was getting hypoglycemia.  This was reduced and Invokana added. Invokana was stopped because of lack of clear benefit in 09/2013 Because of inadequate control he was started on Victoza on his visit in 10/14 in addition to his metformin and glipizide. In 12/2017 he was started on Ozempic instead of Victoza  Recent history:   Non-insulin hypoglycemic drugs: Metformin Er 1500 mg daily,  Jardiance 25 in pm, Amaryl 1 mg twice daily INSULIN regimen: Tresiba 24 units once daily at bedtime  His A1c is 7.6   Current problems, blood sugar patterns and management: His monitor was not downloaded today and difficult to analyze his readings He has recently 2 readings that are significantly high but he thinks these are fasting after he wakes up in the late evening However he has had readings as low as 110 waking up Fasting blood sugar in the lab was 125 He could not tolerate Mounjaro because of diarrhea and nausea His weight is slightly lower compared to January Not able to get the [place] covered by his insurance Because of leg  pain on activity he is not doing any walking or other exercise  Side effects from medications: Nausea from all GLP-1 drugs and diarrhea from [employer] at work: 1-3 am, at home 9 am or 3 PM  Monitors blood glucose:  1 times a day        Glucometer:  Contour  Blood Glucose readings from meter review   PRE-MEAL Fasting Lunch Dinner 8-10 AM Overall  Glucose range: 110-217  96, 120 122-154   Mean/median:     147   POST-MEAL PC Breakfast PC Lunch PC Dinner  Glucose range:     Mean/median:        PRE-MEAL 10 PM Lunch Dinner 8-10 AM Overall  Glucose range: 97-204 137-192  111-200   Mean/median: 158   147 155   POST-MEAL PC Breakfast PC Lunch PC Dinner  Glucose range:   ?  Mean/median:       Dietician visit: Most recent: 5/19  Wt Readings from Last 3 Encounters:  04/09/23 (!) 304 lb (137.9 kg)  01/15/23 (!) 309 lb 9.6 oz (140.4 kg)  12/23/22 (!) 310 lb 9.6 oz (140.9 kg)      Lab Results  Component Value Date   HGBA1C 7.6 (H) 04/07/2023   HGBA1C 7.9 (H) 01/13/2023   HGBA1C 7.3 (A) 11/18/2022   Lab Results  Component Value Date   MICROALBUR 1.1  01/22/2022   LDLCALC 68 01/13/2023   CREATININE 1.41 04/07/2023   Lab Results  Component Value Date   FRUCTOSAMINE 293 (H) 11/16/2022   FRUCTOSAMINE 315 (H) 03/17/2022   FRUCTOSAMINE 294 (H) 02/24/2018    OTHER active problems: See review of systems   Allergies as of 04/09/2023       Reactions   Mounjaro [tirzepatide] Diarrhea, Nausea Only        Medication List        Accurate as of April 09, 2023 11:59 PM. If you have any questions, ask your nurse or doctor.          STOP taking these medications    albuterol 108 (90 Base) MCG/ACT inhaler Commonly known as: VENTOLIN HFA Stopped by: Reather Littler, MD   fluticasone 50 MCG/ACT nasal spray Commonly known as: FLONASE Stopped by: Reather Littler, MD   FreeStyle Libre 3 Reader [place] by: Reather Littler, MD   FreeStyle Libre 3 Sensor Misc Stopped by:  Reather Littler, MD   vitamin A 57846 UNIT capsule Stopped by: Reather Littler, MD       TAKE these medications    Alpha Lipoic Acid 200 MG Caps Take 200 mg by mouth. 2 pills daily   amLODipine 5 MG tablet Commonly known as: NORVASC Take 1 tablet (5 mg total) by mouth daily.   aspirin 81 MG tablet Take 81 mg by mouth daily.   B-D UF III MINI PEN NEEDLES 31G X 5 MM Misc Generic drug: Insulin Pen Needle USE DAILY WITH INSULIN PENS   Bayer Microlet Lancets lancets Use to check blood sugar two times per day   CONTOUR NEXT EZ MONITOR w/Device Kit Use to check blood sugar two times per day   Contour Next Test test strip Generic drug: glucose blood TEST TWICE DAILY   diclofenac 75 MG EC tablet Commonly known as: VOLTAREN TAKE 1 TABLET BY MOUTH TWICE DAILY WITH FOOD   empagliflozin 25 MG Tabs tablet Commonly known as: Jardiance Take 1 tablet (25 mg total) by mouth daily.   ferrous sulfate 325 (65 FE) MG tablet Take 1 tablet (325 mg total) by mouth every other day. On Monday, Wednesday, and Friday   gabapentin 600 MG tablet Commonly known as: NEURONTIN TAKE 1 TABLET(600 MG) BY MOUTH THREE TIMES DAILY   glimepiride 1 MG tablet Commonly known as: AMARYL Take 1 tablet before lunch and bedtime   losartan 50 MG tablet Commonly known as: Cozaar Take 1 tablet (50 mg total) by mouth daily.   Magnesium Oxide 400 MG Caps Take 1 capsule (400 mg total) by mouth daily.   metFORMIN 500 MG 24 hr tablet Commonly known as: GLUCOPHAGE-XR TAKE 4 TABLETS BY MOUTH EVERY DAY WITH SUPPER   multivitamin with minerals Tabs tablet Take 1 tablet by mouth daily.   pantoprazole 40 MG tablet Commonly known as: PROTONIX TAKE 1 TABLET(40 MG) BY MOUTH TWICE DAILY   polyethylene glycol 17 g packet Commonly known as: MiraLax Take 17 g by mouth daily.   rosuvastatin 10 MG tablet Commonly known as: CRESTOR TAKE 1 TABLET(10 MG) BY MOUTH DAILY   sertraline 100 MG tablet Commonly known as:  ZOLOFT Take 2 tablets (200 mg total) by mouth daily.   Evaristo Bury FlexTouch 100 UNIT/ML FlexTouch Pen Generic drug: insulin degludec Inject 22 Units into the skin daily. What changed: how much to take   vitamin B-12 500 MCG tablet Commonly known as: CYANOCOBALAMIN Take 500 mcg by mouth daily.   vitamin C  1000 MG tablet Take 1,000 mg by mouth daily.   VITAMIN D PO Take 1 tablet by mouth daily. D3 1000 IU        Allergies:  Allergies  Allergen Reactions   Mounjaro [Tirzepatide] Diarrhea and Nausea Only    Past Medical History:  Diagnosis Date   Depression    Diabetes mellitus without complication    Hyperlipidemia    Hypertension    Ischemic colitis     Past Surgical History:  Procedure Laterality Date   COLONOSCOPY  06/01/2014   NASAL SEPTUM SURGERY      Family History  Problem Relation Age of Onset   Hypertension Mother    Stroke Father    Diabetes Maternal Grandfather    Colon cancer Neg Hx    Throat cancer Neg Hx    Prostate cancer Neg Hx    Pancreatic cancer Neg Hx    Heart disease Neg Hx    Kidney disease Neg Hx    Liver disease Neg Hx     Social History:  reports that he has never smoked. He has never used smokeless tobacco. He reports that he does not drink alcohol and does not use drugs.    Review of Systems      HYPERTENSION: Treated with 50 mg losartan and Norvasc from his PCP  Also on Jardiance He will check blood pressure at work regularly  BP Readings from Last 3 Encounters:  04/09/23 122/84  01/15/23 (!) 140/86  12/23/22 105/79   Renal function as follows:   Lab Results  Component Value Date   CREATININE 1.41 04/07/2023   CREATININE 1.34 01/13/2023   CREATININE 1.28 (H) 12/23/2022        Hyperlipidemia: taking CRESTOR 10 mg LDL is below 100 and triglycerides are still high but improved, previously LDL was higher with pravastatin  Lab Results  Component Value Date   CHOL 146 01/13/2023   CHOL 151 07/30/2022   CHOL 131  01/22/2022   Lab Results  Component Value Date   HDL 43.10 01/13/2023   HDL 41.60 07/30/2022   HDL 40.90 01/22/2022   Lab Results  Component Value Date   LDLCALC 68 01/13/2023   LDLCALC 63 04/15/2021   LDLCALC 73 08/09/2020   Lab Results  Component Value Date   TRIG 176.0 (H) 01/13/2023   TRIG 237.0 (H) 07/30/2022   TRIG 201.0 (H) 01/22/2022   Lab Results  Component Value Date   CHOLHDL 3 01/13/2023   CHOLHDL 4 07/30/2022   CHOLHDL 3 01/22/2022   Lab Results  Component Value Date   LDLDIRECT 85.0 07/30/2022   LDLDIRECT 69.0 01/22/2022   LDLDIRECT 106.0 10/16/2021      Lab Results  Component Value Date   ALT 14 01/13/2023     NEUROPATHY He has had symptoms of neuropathy in feet, including burning  He was tried on Lyrica 150 mg but he does not think this works any better than gabapentin He previously was on gabapentin 600 mg 3 capsules a day without adequate control  Had a normal eye exam and was seen by ophthalmologist in 5/23    Physical Examination:  BP 122/84 (BP Location: Left Arm, Patient Position: Sitting, Cuff Size: Large)   Pulse 95   Ht 6' (1.829 m)   Wt (!) 304 lb (137.9 kg)   SpO2 98%   BMI 41.23 kg/m   No pedal edema  ASSESSMENT/PLAN:   Diabetes type 2 with obesity:  See history of present illness for  detailed discussion of current diabetes management, blood sugar patterns and problems identified   A1c is 7 and 6  Oral medications: Amaryl 1 mg before lunch and bedtime, along with Jardiance/metformin  Insulin: Tresiba 22 units daily  Blood sugars are slightly better compared to his last visit and he has lost some weight However difficult to assess blood sugar patterns since he does not have CGM; his home blood sugar average is 147 lower than expected from his A1c Also appears to have variable fasting blood sugars  Currently not able to exercise and BMI is still over 40 Tolerant to [employer]   Plan: No change in medication  regimen Continue to improve diet Try to check some readings after meals also If able to start back on walking encouraged him to do so  RENAL dysfunction: His creatinine is again variable but not associated with higher microalbumin which need to be rechecked  HYPERLIPIDEMIA: Triglycerides relatively better  HYPERTENSION: Blood pressure is seems better controlled now   There are no Patient Instructions on file for this visit.     Total visit time including counseling = 30 minutes  Reather Littler 04/10/2023, 10:56 AM

## 2023-04-14 ENCOUNTER — Encounter: Payer: BC Managed Care – PPO | Admitting: Student

## 2023-04-22 ENCOUNTER — Encounter: Payer: Self-pay | Admitting: Endocrinology

## 2023-04-22 DIAGNOSIS — H5213 Myopia, bilateral: Secondary | ICD-10-CM | POA: Diagnosis not present

## 2023-04-22 DIAGNOSIS — H524 Presbyopia: Secondary | ICD-10-CM | POA: Diagnosis not present

## 2023-04-22 DIAGNOSIS — Z7984 Long term (current) use of oral hypoglycemic drugs: Secondary | ICD-10-CM | POA: Diagnosis not present

## 2023-04-22 DIAGNOSIS — Z794 Long term (current) use of insulin: Secondary | ICD-10-CM | POA: Diagnosis not present

## 2023-04-22 DIAGNOSIS — E1136 Type 2 diabetes mellitus with diabetic cataract: Secondary | ICD-10-CM | POA: Diagnosis not present

## 2023-04-22 DIAGNOSIS — H25813 Combined forms of age-related cataract, bilateral: Secondary | ICD-10-CM | POA: Diagnosis not present

## 2023-04-22 DIAGNOSIS — H52203 Unspecified astigmatism, bilateral: Secondary | ICD-10-CM | POA: Diagnosis not present

## 2023-04-22 LAB — HM DIABETES EYE EXAM

## 2023-04-29 ENCOUNTER — Encounter: Payer: Self-pay | Admitting: Dietician

## 2023-05-03 ENCOUNTER — Emergency Department (HOSPITAL_COMMUNITY)
Admission: EM | Admit: 2023-05-03 | Discharge: 2023-05-03 | Disposition: A | Discharge status: Home / Self Care | Payer: BC Managed Care – PPO | Attending: Emergency Medicine | Admitting: Emergency Medicine

## 2023-05-03 ENCOUNTER — Emergency Department (HOSPITAL_COMMUNITY): Payer: BC Managed Care – PPO

## 2023-05-03 ENCOUNTER — Other Ambulatory Visit: Payer: Self-pay

## 2023-05-03 ENCOUNTER — Encounter (HOSPITAL_COMMUNITY): Payer: Self-pay | Admitting: Emergency Medicine

## 2023-05-03 DIAGNOSIS — F4389 Other reactions to severe stress: Secondary | ICD-10-CM | POA: Diagnosis not present

## 2023-05-03 DIAGNOSIS — X58XXXA Exposure to other specified factors, initial encounter: Secondary | ICD-10-CM | POA: Diagnosis not present

## 2023-05-03 DIAGNOSIS — Z794 Long term (current) use of insulin: Secondary | ICD-10-CM | POA: Diagnosis not present

## 2023-05-03 DIAGNOSIS — Z79899 Other long term (current) drug therapy: Secondary | ICD-10-CM | POA: Insufficient documentation

## 2023-05-03 DIAGNOSIS — E1140 Type 2 diabetes mellitus with diabetic neuropathy, unspecified: Secondary | ICD-10-CM | POA: Insufficient documentation

## 2023-05-03 DIAGNOSIS — Z7984 Long term (current) use of oral hypoglycemic drugs: Secondary | ICD-10-CM | POA: Insufficient documentation

## 2023-05-03 DIAGNOSIS — I10 Essential (primary) hypertension: Secondary | ICD-10-CM | POA: Diagnosis not present

## 2023-05-03 DIAGNOSIS — M84375A Stress fracture, left foot, initial encounter for fracture: Secondary | ICD-10-CM

## 2023-05-03 DIAGNOSIS — M79672 Pain in left foot: Secondary | ICD-10-CM | POA: Diagnosis not present

## 2023-05-03 MED ORDER — DICLOFENAC SODIUM 1 % EX GEL: 2.0000 g | Freq: Four times a day (QID) | CUTANEOUS | 2 refills | Status: DC

## 2023-05-03 NOTE — ED Triage Notes (Addendum)
Pt came in POV for foot pain on left side of foot near pinky x1week. Exacerbated by the pressure of standing and walking and tender to palpaiton. Pt is unsure the cause of the pain. Pt dose have DM with some neuropathy.

## 2023-05-03 NOTE — ED Notes (Signed)
DC instructions reviewed with pt. PT verbalized understanding. Pt DC °

## 2023-05-03 NOTE — ED Provider Notes (Signed)
[facility] DEPARTMENT AT [facility] Provider Note   CSN: [id number] Arrival date & time: 05/03/23  4098     History  Hypertension Hyperlipidemia Diabetes II complicated by neuropathy Gastroesophageal reflux disease Major depressive disorder   Chief Complaint  Patient presents with   Foot Pain    Jack Wallace is a 62 y.o. male with a past medical history of hypertension, hyperlipidemia, diabetes II, GERD, and MDD who presents with left foot pain.   Pain started acutely about 7-10 days ago and its intensity has since gradually worsened. Describes quality as dull achy sensation, sometimes sharp, located on the lateral aspect of his distal left foot. Worsens throughout the day, especially during activity, and improves with rest. 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 States that he has been using a new machine for the past six months, and more frequently over last few weeks, that requires him to take large step upon dismount. Sometimes, he will almost have to jump off the forklift. Reports slightly reduced sensation and neuropathy in his lower extremities due to diabetes. Manages diabetes and the neuropathy with medications that he takes regularly. Denies fever, chills, sweats, cough, diarrhea, rashes, wounds, and any recent changes in footwear.    Home Medications Prior to Admission medications   Medication Sig Start Date End Date Taking? Authorizing Provider  diclofenac Sodium (VOLTAREN) 1 % GEL Apply 2 g topically 4 (four) times daily. 05/03/23  Yes Crissie Sickles, MD  Alpha Lipoic Acid 200 MG CAPS Take 200 mg by mouth. 2 pills daily    [provider]  amLODipine (NORVASC) 5 MG tablet Take 1 tablet (5 mg total) by mouth daily. 09/02/22 09/02/23  Masters, Katie, DO  Ascorbic Acid (VITAMIN C) 1000 MG tablet Take 1,000 mg by mouth daily.    [provider]  aspirin 81 MG tablet Take 81 mg by mouth daily.    [provider]   BAYER MICROLET LANCETS lancets Use to check blood sugar two times per day 12/15/16   Reather Littler, MD  Blood Glucose Monitoring Suppl (CONTOUR NEXT EZ MONITOR) w/Device KIT Use to check blood sugar two times per day 12/15/16   Reather Littler, MD  Cholecalciferol (VITAMIN D PO) Take 1 tablet by mouth daily. D3 1000 IU    [provider]  empagliflozin (JARDIANCE) 25 MG TABS tablet Take 1 tablet (25 mg total) by mouth daily. 02/23/23   Reather Littler, MD  ferrous sulfate 325 (65 FE) MG tablet Take 1 tablet (325 mg total) by mouth every other day. On Monday, Wednesday, and Friday 08/13/22 08/13/23  Morene Crocker, MD  gabapentin (NEURONTIN) 600 MG tablet TAKE 1 TABLET(600 MG) BY MOUTH THREE TIMES DAILY 04/06/23   Reather Littler, MD  glimepiride (AMARYL) 1 MG tablet Take 1 tablet before lunch and bedtime 02/16/22   Reather Littler, MD  glucose blood (CONTOUR NEXT TEST) test strip TEST TWICE DAILY 04/06/23   Reather Littler, MD  insulin degludec (TRESIBA FLEXTOUCH) 100 UNIT/ML FlexTouch Pen Inject 22 Units into the skin daily. Patient taking differently: Inject 24 Units into the skin daily. 03/22/23   Reather Littler, MD  Insulin Pen Needle (B-D UF III MINI PEN NEEDLES) 31G X 5 MM MISC USE DAILY WITH INSULIN PENS 10/05/22   Reather Littler, MD  losartan (COZAAR) 50 MG tablet Take 1 tablet (50 mg total) by mouth daily. 09/02/22 09/02/23  Masters, Katie, DO  Magnesium Oxide 400 MG CAPS Take 1 capsule (400 mg  total) by mouth daily. 12/24/22   Marrianne Mood, MD  metFORMIN (GLUCOPHAGE-XR) 500 MG 24 hr tablet TAKE 4 TABLETS BY MOUTH EVERY DAY WITH SUPPER 10/06/22   Reather Littler, MD  Multiple Vitamin (MULTIVITAMIN WITH MINERALS) TABS tablet Take 1 tablet by mouth daily.    [provider]  pantoprazole (PROTONIX) 40 MG tablet TAKE 1 TABLET(40 MG) BY MOUTH TWICE DAILY 09/15/22   Quincy Simmonds, MD  polyethylene glycol (MIRALAX) 17 g packet Take 17 g by mouth daily. 08/13/22   Morene Crocker, MD  rosuvastatin  (CRESTOR) 10 MG tablet TAKE 1 TABLET(10 MG) BY MOUTH DAILY 02/16/23   Reather Littler, MD  sertraline (ZOLOFT) 100 MG tablet Take 2 tablets (200 mg total) by mouth daily. 02/09/23 04/10/23  Willette Cluster, MD  vitamin B-12 (CYANOCOBALAMIN) 500 MCG tablet Take 500 mcg by mouth daily.    [provider]      Allergies    Greggory Keen [tirzepatide]    Review of Systems   Review of Systems  Foot pain   Physical Exam Updated Vital Signs BP (!) 153/95 (BP Location: Right Arm)   Pulse (!) 110   Temp 99.1 F (37.3 C) (Oral)   Resp 18   Ht 6' (1.829 m)   Wt 136.1 kg   SpO2 95%   BMI 40.69 kg/m  Physical Exam  Awake and alert, fully oriented, lying comfortably in bed, not in acute distress Tachycardic with regular rhythm, normal S1 and S2, pulses intact Breathing unlabored, lungs clear to auscultation, no wheezing or crackles Abdomen soft and non-distended, no tenderness or rigidity Range of motion in L foot full with flexion, extension, eversion, and inversion Focal tenderness to palpation over lateral and plantar aspect of distal L foot Pain most prominent at base of fifth digit, mild pain upon inversion Sensation intact in dermatomes L4-S1, slightly reduced on medial aspect Strength 5/5 with flexion, extension, eversion, and inversion No overlying erythema, edema, or wounds   ED Results / Procedures / Treatments   Labs (all labs ordered are listed, but only abnormal results are displayed) Labs Reviewed - No data to display  EKG None  Radiology DG Foot Complete Left  Result Date: 05/03/2023 CLINICAL DATA:  Left lateral foot pain. EXAM: LEFT FOOT - COMPLETE 3+ VIEW COMPARISON:  03/28/2018 FINDINGS: Subtle linear sclerosis through the lateral aspect of the proximal fifth metatarsal may represent a stress injury and is not associated with any cortical lucency or disruption. No overlying soft tissue swelling or underlying bony lesion identified. No significant arthropathy of the  left foot. IMPRESSION: Subtle linear sclerosis through the lateral aspect of the proximal fifth metatarsal may represent a stress injury and is not associated with any cortical lucency or disruption. Electronically Signed   By: Irish Lack M.D.   On: 05/03/2023 08:56    Procedures Procedures   None   Medications Ordered in ED Medications - No data to display  ED Course/ Medical Decision Making/ A&P   {   Click here for ABCD2, HEART and other calculator   Medical Decision Making Amount and/or Complexity of Data Reviewed Radiology: ordered.   Patient presents with lateral left foot pain localized to base of fifth digit that started about 7-10 days ago after using a new forklift that requires large 2-3 foot stepdown for dismount. Pain worsens with activity and improves after rest. Low concern for infection, wound, and vascular disease. Foot radiograph revealed subtle linear sclerosis through lateral aspect of proximal fifth metatarsal without cortical  lucency or disruption. Findings are consistent with stress reaction of fifth metatarsal. Discharged with boot to minimize traumatic forces and topical diclofenac for symptom relief. Provided work excuse for one week and advised to avoid physical activity that involves dismounting >2 feet. Encouraged to follow up with primary care provider in about one month and request imaging if symptoms persist or worsen.    Final Clinical Impression(s) / ED Diagnoses Final diagnoses:  Stress reaction of left foot, initial encounter    Rx / DC Orders ED Discharge Orders          Ordered    diclofenac Sodium (VOLTAREN) 1 % GEL  4 times daily        05/03/23 1191              Crissie Sickles, MD 05/03/23 4782    Gwyneth Sprout, MD 05/03/23 [phone]

## 2023-05-03 NOTE — Discharge Instructions (Addendum)
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 We performed a foot X-ray that found evidence of a stress fracture in your fifth digit. We recommend resting your foot and wearing a boot to help the injury heal. You can use topical diclofenac for pain relief but please avoid any oral medications other than Tylenol, which is okay. Follow up with your primary care physician in about one month, you may need an MRI if the pain worsens.

## 2023-05-04 ENCOUNTER — Encounter: Payer: BC Managed Care – PPO | Admitting: Student

## 2023-05-07 ENCOUNTER — Other Ambulatory Visit: Payer: Self-pay

## 2023-05-07 DIAGNOSIS — E1165 Type 2 diabetes mellitus with hyperglycemia: Secondary | ICD-10-CM

## 2023-05-07 MED ORDER — GLIMEPIRIDE 1 MG PO TABS
ORAL_TABLET | ORAL | 3 refills | Status: DC
Start: 2023-05-07 — End: 2023-10-29

## 2023-05-23 ENCOUNTER — Encounter: Payer: Self-pay | Admitting: Endocrinology

## 2023-05-24 ENCOUNTER — Encounter: Payer: Self-pay | Admitting: Student

## 2023-05-24 ENCOUNTER — Ambulatory Visit: Payer: BC Managed Care – PPO | Admitting: Student

## 2023-05-24 ENCOUNTER — Other Ambulatory Visit: Payer: Self-pay

## 2023-05-24 VITALS — BP 140/106 | HR 101 | Temp 98.9°F | Ht 72.0 in | Wt 305.3 lb

## 2023-05-24 DIAGNOSIS — M84375D Stress fracture, left foot, subsequent encounter for fracture with routine healing: Secondary | ICD-10-CM

## 2023-05-24 DIAGNOSIS — Z794 Long term (current) use of insulin: Secondary | ICD-10-CM

## 2023-05-24 DIAGNOSIS — E1159 Type 2 diabetes mellitus with other circulatory complications: Secondary | ICD-10-CM

## 2023-05-24 DIAGNOSIS — E1149 Type 2 diabetes mellitus with other diabetic neurological complication: Secondary | ICD-10-CM

## 2023-05-24 DIAGNOSIS — Z7984 Long term (current) use of oral hypoglycemic drugs: Secondary | ICD-10-CM

## 2023-05-24 DIAGNOSIS — M84375A Stress fracture, left foot, initial encounter for fracture: Secondary | ICD-10-CM | POA: Insufficient documentation

## 2023-05-24 DIAGNOSIS — K2100 Gastro-esophageal reflux disease with esophagitis, without bleeding: Secondary | ICD-10-CM

## 2023-05-24 DIAGNOSIS — F321 Major depressive disorder, single episode, moderate: Secondary | ICD-10-CM

## 2023-05-24 DIAGNOSIS — E1142 Type 2 diabetes mellitus with diabetic polyneuropathy: Secondary | ICD-10-CM | POA: Diagnosis not present

## 2023-05-24 DIAGNOSIS — I152 Hypertension secondary to endocrine disorders: Secondary | ICD-10-CM | POA: Diagnosis not present

## 2023-05-24 DIAGNOSIS — E785 Hyperlipidemia, unspecified: Secondary | ICD-10-CM

## 2023-05-24 MED ORDER — BD PEN NEEDLE MINI U/F 31G X 5 MM MISC: 1 refills | Status: DC

## 2023-05-24 MED ORDER — SERTRALINE HCL 100 MG PO TABS
200.0000 mg | ORAL_TABLET | Freq: Every day | ORAL | 11 refills | Status: AC
Start: 2023-05-24 — End: 2024-05-23

## 2023-05-24 MED ORDER — PANTOPRAZOLE SODIUM 40 MG PO TBEC
DELAYED_RELEASE_TABLET | ORAL | 11 refills | Status: DC
Start: 2023-05-24 — End: 2023-06-14

## 2023-05-24 NOTE — Patient Instructions (Addendum)
Thank you, Mr.Calton A Premo for allowing [place] to provide your care today. Today we discussed  Blood pressure - Take your medication when you get home - Monitor your symptoms for headaches, chest pain, or changes in your vision - Take you medications before next visit, if it is still elevated, we may need to increase your medications.    Diabetes - Talk to Dr. Lucianne Muss about your low blood sugars and perhaps trying Ozempic again  Foot  - Boot for 3 more weeks, you can take it off while at work  - If the pain worsens after those 3 weeks, come back for re-evaluation My Chart Access: [email or web address]  Please follow-up in: 3 Nurse only visit for BP check  Months for chronic conditions    We look forward to seeing you next time. Please call our clinic at [phone] if you have any questions or concerns. The best time to call is Monday-Friday from 9am-4pm, but there is someone available 24/7. If after hours or the weekend, call the main hospital number and ask for the Internal Medicine Resident On-Call. If you need medication refills, please notify your pharmacy one week in advance and they will send [place] a request.   Thank you for letting us take part in your care. Wishing you the best!  Morene Crocker, MD 05/24/2023, 9:10 AM [facility] Internal Medicine Resident, PGY-1

## 2023-05-24 NOTE — Progress Notes (Signed)
Subjective:  CC: chronic condition follow up  HPI:  [name] is a 62 y.o. male with a past medical history stated below and presents today for HTN, DM, and recent ED visit for L fifth metatarsal stress fracture. Please see problem based assessment and plan for additional details.  Past Medical History:  Diagnosis Date   Depression    Diabetes mellitus without complication (HCC)    Hyperlipidemia    Hypertension    Ischemic colitis (HCC)     Current Outpatient Medications on File Prior to Visit  Medication Sig Dispense Refill   Alpha Lipoic Acid 200 MG CAPS Take 200 mg by mouth. 2 pills daily     amLODipine (NORVASC) 5 MG tablet Take 1 tablet (5 mg total) by mouth daily. 30 tablet 11   Ascorbic Acid (VITAMIN C) 1000 MG tablet Take 1,000 mg by mouth daily.     aspirin 81 MG tablet Take 81 mg by mouth daily.     BAYER MICROLET LANCETS lancets Use to check blood sugar two times per day 100 each 2   Blood Glucose Monitoring Suppl (CONTOUR NEXT EZ MONITOR) w/Device KIT Use to check blood sugar two times per day 1 kit 2   Cholecalciferol (VITAMIN D PO) Take 1 tablet by mouth daily. D3 1000 IU     diclofenac Sodium (VOLTAREN) 1 % GEL Apply 2 g topically 4 (four) times daily. 100 g 2   empagliflozin (JARDIANCE) 25 MG TABS tablet Take 1 tablet (25 mg total) by mouth daily. 90 tablet 2   ferrous sulfate 325 (65 FE) MG tablet Take 1 tablet (325 mg total) by mouth every other day. On Monday, Wednesday, and Friday 15 tablet 11   gabapentin (NEURONTIN) 600 MG tablet TAKE 1 TABLET(600 MG) BY MOUTH THREE TIMES DAILY 270 tablet 1   glimepiride (AMARYL) 1 MG tablet Take 1 tablet before lunch and bedtime 180 tablet 3   glucose blood (CONTOUR NEXT TEST) test strip TEST TWICE DAILY 200 strip 2   insulin degludec (TRESIBA FLEXTOUCH) 100 UNIT/ML FlexTouch Pen Inject 22 Units into the skin daily. (Patient taking differently: Inject 24 Units into the skin daily.) 15 mL 3   Insulin Pen Needle (B-D  UF III MINI PEN NEEDLES) 31G X 5 MM MISC USE DAILY WITH INSULIN PENS 100 each 1   losartan (COZAAR) 50 MG tablet Take 1 tablet (50 mg total) by mouth daily. 30 tablet 11   Magnesium Oxide 400 MG CAPS Take 1 capsule (400 mg total) by mouth daily. 30 capsule 0   metFORMIN (GLUCOPHAGE-XR) 500 MG 24 hr tablet TAKE 4 TABLETS BY MOUTH EVERY DAY WITH SUPPER 360 tablet 2   Multiple Vitamin (MULTIVITAMIN WITH MINERALS) TABS tablet Take 1 tablet by mouth daily.     polyethylene glycol (MIRALAX) 17 g packet Take 17 g by mouth daily. 14 each 0   rosuvastatin (CRESTOR) 10 MG tablet TAKE 1 TABLET(10 MG) BY MOUTH DAILY 30 tablet 3   vitamin B-12 (CYANOCOBALAMIN) 500 MCG tablet Take 500 mcg by mouth daily.     No current facility-administered medications on file prior to visit.    Family History  Problem Relation Age of Onset   Hypertension Mother    Stroke Father    Diabetes Maternal Grandfather    Colon cancer Neg Hx    Throat cancer Neg Hx    Prostate cancer Neg Hx    Pancreatic cancer Neg Hx    Heart disease Neg Hx  Kidney disease Neg Hx    Liver disease Neg Hx     Social History   Socioeconomic History   Marital status: Single    Spouse name: Not on file   Number of children: 0   Years of education: Not on file   Highest education level: Not on file  Occupational History   Occupation: Midwife  Tobacco Use   Smoking status: Never   Smokeless tobacco: Never  Substance and Sexual Activity   Alcohol use: No   Drug use: No   Sexual activity: Not on file  Other Topics Concern   Not on file  Social History Narrative   Not on file   Social Determinants of Health   Financial Resource Strain: Not on file  Food Insecurity: Not on file  Transportation Needs: No Transportation Needs (08/27/2022)   PRAPARE - Administrator, Civil Service (Medical): No    Lack of Transportation (Non-Medical): No  Physical Activity: Not on file  Stress: Not on file  Social Connections: Not  on file  Intimate Partner Violence: Not on file    Review of Systems: ROS negative except for what is noted on the assessment and plan.  Objective:   Vitals:   05/24/23 0839 05/24/23 0906  BP: (!) 150/97 (!) 140/106  Pulse: (!) 113 (!) 101  Temp: 98.9 F (37.2 C)   TempSrc: Oral   SpO2: 96%   Weight: (!) 305 lb 4.8 oz (138.5 kg)   Height: 6' (1.829 m)     Physical Exam: Constitutional: well-appearing male sitting in chair, in no acute distress HENT: normocephalic atraumatic, mucous membranes moist Eyes: conjunctiva non-erythematous Neck: supple Cardiovascular: regular rate and rhythm, no m/r/g Pulmonary/Chest: normal work of breathing on room air, lungs clear to auscultation bilaterally Abdominal: soft, non-tender, non-distended MSK: normal bulk and tone. L foot with CAM boot, uncovered. No visible erythema, edema. No tenderness to palpation and full foot range of motion.  Neurological: alert & oriented x 3, 5/5 strength in bilateral upper and lower extremities, normal gait Skin: warm and dry Psych: Pleasant mood and affect       05/24/2023    8:52 AM  Depression screen PHQ 2/9  Decreased Interest 0  Down, Depressed, Hopeless 0  PHQ - 2 Score 0  Altered sleeping 0  Tired, decreased energy 0  Change in appetite 0  Feeling bad or failure about yourself  0  Trouble concentrating 0  Moving slowly or fidgety/restless 0  Suicidal thoughts 0  PHQ-9 Score 0  Difficult doing work/chores Not difficult at all        No data to display           Assessment & Plan:   Hypertension associated with diabetes [facility]) Patient had been checking his BP during stressful situations, which was discussed during office visits. OV have been normal up until today. He walked from the main entrance and is wearing CAM boot. Patient did not take his medication this AM as he works third shift and was rushing to this medicine. But patient reports that his BP at work, where he gets them checked  often, are around 119-125/70-80. Denies chest pains, palpitations, shortness of breath, changes in vision.   - Losartan 50 mg - Nurse only visit in 3 weeks for BP check after medication administrations  Type 2 diabetes mellitus with peripheral neuropathy [facility]) Last visit with Dr. Lucianne Muss in April. He is schedule to see him in July.  Last A1c:  Lab Results      Component                Value               Date                      HGBA1C                   7.6 (H)             04/07/2023           Patient's Evaristo Bury was increased to 24 units daily. Otherwise, no other changes to his medication. Patient has tried GLP1-RA, most recently [facility] with severe diarrhea. Has tried Ozempic. He has a historuy of chronic nausea and had been afraid to try other medications. Fasting blood glucose 120-200. Patient has one documented episode of hypoglycemia, otherwise denies hypoglycemic episodes. Making dietary changes with improvement in snacking but has had difficulties with moving as he is wearing a CAM boot. Last Cr and GFR: 1.41 and GFR 53, stable on 04/07/2023. On ARB and SGLT2i.   Plan: Continue on  Metformin ER 1500 daily Glimepiride 1 mg BID Jardiance 25 mg Tresiba 24 units daily - Discussed hypoglycemic episodes and potential need to transition off Glimepiride,Now the chronic nausea has been in remission, patient may try Ozempic again to attempt towean off of sulfonylurea/tresiba. Patient would like to discuss this with Dr. Lucianne Muss   Diabetic neuropathy [facility]) Controlled on B12 supplementation and Gabapentin  MDD (major depressive disorder) In remission while on medication. PHQ-9 today scored 0. -refilled Sertraline  Hyperlipidemia LDL 68 Patient on stable dose of Crestor 10  Stress fracture of metatarsal bone of left foot On 5/13, patient presented to ED with lateral L foot pain localized to the base of the fifth digit, worse with activity and improves with rest. This was first noted when he was  at work.XR of foot with possible stress fracture. Patient has not needed to take medications to pain, which has improved since initial presentation on 5/13.Marland Kitchen Patient has been wearing a CAM boot for the past 3 weeks. Normal exam today with no tenderness appreciated. Patient without antalgic gait. Discussed decreasing weight bearing activities on L foot, removing boot while at work - Continue to monitor - Weight off of it for 3 weeks -Precautionary symptoms for return reviewed    Return if symptoms worsen or fail to improve, for Nurse visit for BP check, chronic condition follow up in 3 months. Patient discussed with Dr. Allyn Kenner, MD [facility] Internal Medicine Program - PGY-1 05/24/2023, 9:31 AM

## 2023-05-24 NOTE — Assessment & Plan Note (Signed)
In remission while on medication. PHQ-9 today scored 0. -refilled Sertraline

## 2023-05-24 NOTE — Assessment & Plan Note (Signed)
LDL 68 Patient on stable dose of Crestor 10

## 2023-05-24 NOTE — Assessment & Plan Note (Signed)
Last visit with Dr. Lucianne Muss in April. He is schedule to see him in July.  Last A1c: Lab Results      Component                Value               Date                      HGBA1C                   7.6 (H)             04/07/2023           Patient's Jack Wallace was increased to 24 units daily. Otherwise, no other changes to his medication. Patient has tried GLP1-RA, most recently [facility] with severe diarrhea. Has tried Ozempic. He has a historuy of chronic nausea and had been afraid to try other medications. Fasting blood glucose 120-200. Patient has one documented episode of hypoglycemia, otherwise denies hypoglycemic episodes. Making dietary changes with improvement in snacking but has had difficulties with moving as he is wearing a CAM boot. Last Cr and GFR: 1.41 and GFR 53, stable on 04/07/2023. On ARB and SGLT2i.   Plan: Continue on  Metformin ER 1500 daily Glimepiride 1 mg BID Jardiance 25 mg Tresiba 24 units daily - Discussed hypoglycemic episodes and potential need to transition off Glimepiride,Now the chronic nausea has been in remission, patient may try Ozempic again to attempt towean off of sulfonylurea/tresiba. Patient would like to discuss this with Dr. Lucianne Muss

## 2023-05-24 NOTE — Assessment & Plan Note (Signed)
Controlled on B12 supplementation and Gabapentin

## 2023-05-24 NOTE — Assessment & Plan Note (Signed)
Patient had been checking his BP during stressful situations, which was discussed during office visits. OV have been normal up until today. He walked from the main entrance and is wearing CAM boot. Patient did not take his medication this AM as he works third shift and was rushing to this medicine. But patient reports that his BP at work, where he gets them checked often, are around 119-125/70-80. Denies chest pains, palpitations, shortness of breath, changes in vision.   - Losartan 50 mg - Nurse only visit in 3 weeks for BP check after medication administrations

## 2023-05-24 NOTE — Assessment & Plan Note (Signed)
On 5/13, patient presented to ED with lateral L foot pain localized to the base of the fifth digit, worse with activity and improves with rest. This was first noted when he was at work.XR of foot with possible stress fracture. Patient has not needed to take medications to pain, which has improved since initial presentation on 5/13.Marland Kitchen Patient has been wearing a CAM boot for the past 3 weeks. Normal exam today with no tenderness appreciated. Patient without antalgic gait. Discussed decreasing weight bearing activities on L foot, removing boot while at work - Continue to monitor - Weight off of it for 3 weeks -Precautionary symptoms for return reviewed

## 2023-05-24 NOTE — Progress Notes (Signed)
Internal Medicine Clinic Attending  Case discussed with Dr. Gomez-Caraballo  At the time of the visit.  We reviewed the resident's history and exam and pertinent patient test results.  I agree with the assessment, diagnosis, and plan of care documented in the resident's note.  

## 2023-06-14 ENCOUNTER — Ambulatory Visit: Payer: BC Managed Care – PPO | Admitting: *Deleted

## 2023-06-14 ENCOUNTER — Other Ambulatory Visit: Payer: Self-pay | Admitting: *Deleted

## 2023-06-14 DIAGNOSIS — K2100 Gastro-esophageal reflux disease with esophagitis, without bleeding: Secondary | ICD-10-CM

## 2023-06-14 MED ORDER — PANTOPRAZOLE SODIUM 40 MG PO TBEC
DELAYED_RELEASE_TABLET | ORAL | 11 refills | Status: DC
Start: 2023-06-14 — End: 2024-01-03

## 2023-06-14 NOTE — Progress Notes (Addendum)
    Jack Wallace presented today for blood pressure check. Patient is prescribed blood pressure medications and I confirmed that patient did take their blood pressure medication prior to today's appointment. Blood pressure was taken in the usual and appropriate manner using an automated BP cuff.     Vitals:   06/14/23 0904  BP: 138/77      Results of today's visit will be routed to Dr. Daiva Eves for review and further management.   Addendum: Improvement of BP, near goal on current medications. Will continue monitoring. -Morene Crocker, MD

## 2023-06-16 ENCOUNTER — Other Ambulatory Visit: Payer: Self-pay

## 2023-06-16 DIAGNOSIS — E7800 Pure hypercholesterolemia, unspecified: Secondary | ICD-10-CM

## 2023-06-16 MED ORDER — ROSUVASTATIN CALCIUM 10 MG PO TABS
ORAL_TABLET | ORAL | 3 refills | Status: DC
Start: 2023-06-16 — End: 2023-10-19

## 2023-07-02 ENCOUNTER — Other Ambulatory Visit: Payer: BC Managed Care – PPO

## 2023-07-05 ENCOUNTER — Other Ambulatory Visit (INDEPENDENT_AMBULATORY_CARE_PROVIDER_SITE_OTHER): Payer: BC Managed Care – PPO

## 2023-07-05 DIAGNOSIS — E1165 Type 2 diabetes mellitus with hyperglycemia: Secondary | ICD-10-CM

## 2023-07-05 DIAGNOSIS — Z794 Long term (current) use of insulin: Secondary | ICD-10-CM

## 2023-07-05 LAB — BASIC METABOLIC PANEL
BUN: 17 mg/dL (ref 6–23)
CO2: 28 mEq/L (ref 19–32)
Calcium: 9.7 mg/dL (ref 8.4–10.5)
Chloride: 106 mEq/L (ref 96–112)
Creatinine, Ser: 1.12 mg/dL (ref 0.40–1.50)
GFR: 70.26 mL/min (ref >60.00)
Glucose, Bld: 100 mg/dL — ABNORMAL HIGH (ref 70–99)
Potassium: 4 mEq/L (ref 3.5–5.1)
Sodium: 144 mEq/L (ref 135–145)

## 2023-07-05 LAB — HEMOGLOBIN A1C: Hgb A1c MFr Bld: 7.6 % — ABNORMAL HIGH (ref 4.6–6.5)

## 2023-07-06 ENCOUNTER — Other Ambulatory Visit: Payer: Self-pay

## 2023-07-06 DIAGNOSIS — E1165 Type 2 diabetes mellitus with hyperglycemia: Secondary | ICD-10-CM

## 2023-07-06 MED ORDER — METFORMIN HCL ER 500 MG PO TB24
ORAL_TABLET | ORAL | 2 refills | Status: DC
Start: 2023-07-06 — End: 2024-02-02

## 2023-07-08 ENCOUNTER — Encounter: Payer: Self-pay | Admitting: Endocrinology

## 2023-07-09 ENCOUNTER — Other Ambulatory Visit: Payer: BC Managed Care – PPO

## 2023-07-15 ENCOUNTER — Ambulatory Visit: Payer: BC Managed Care – PPO | Admitting: Endocrinology

## 2023-07-15 ENCOUNTER — Encounter: Payer: Self-pay | Admitting: Endocrinology

## 2023-07-15 VITALS — BP 126/80 | HR 88 | Ht 72.0 in | Wt 301.8 lb

## 2023-07-15 DIAGNOSIS — E1165 Type 2 diabetes mellitus with hyperglycemia: Secondary | ICD-10-CM

## 2023-07-15 DIAGNOSIS — E782 Mixed hyperlipidemia: Secondary | ICD-10-CM

## 2023-07-15 DIAGNOSIS — Z794 Long term (current) use of insulin: Secondary | ICD-10-CM

## 2023-07-15 DIAGNOSIS — I10 Essential (primary) hypertension: Secondary | ICD-10-CM

## 2023-07-15 NOTE — Progress Notes (Signed)
Patient ID: Jack Wallace, male   DOB: 03-12-1961, 62 y.o.   MRN: [id number]   Reason for Appointment : Followup     History of Present Illness            Diagnosis: Type 2 diabetes mellitus, date of diagnosis: 1993         Past diabetes history: He has been treated with various drugs for his diabetes over the last several years. He has been taking metformin for at least 10 years and has been tolerating this. Over the years he has had additional medications to improve his control. He was also taking Actos previously and not clear if he had any side effects. This was stopped because of fear of long-term effects  In early 2014 he had poor control before he started walking and lost 15 pounds. Subsequently he was getting low blood sugar during the day especially before lunch and sometimes in the afternoon His glipizide was  reduced but even with 5 mg twice a day he was getting hypoglycemia.  This was reduced and Invokana added. Invokana was stopped because of lack of clear benefit in 09/2013 Because of inadequate control he was started on Victoza on his visit in 10/14 in addition to his metformin and glipizide. In 12/2017 he was started on Ozempic instead of Victoza  Recent history:   Non-insulin hypoglycemic drugs: Metformin Er 1500 mg daily,  Jardiance 25 in pm, Amaryl 1 mg twice daily INSULIN regimen: Tresiba 24 units once daily at bedtime  His A1c is 7.6 again   Current problems, blood sugar patterns and management: His monitor was not downloaded today Generally checking blood sugars either on waking up in the evening or around midnight and some readings may be postprandial He says he feels hypoglycemic when his blood sugars are below 100 and he felt that way yesterday morning when he woke up with a 81 blood sugar Most of the blood sugars are mildly increased but near normal the last 2 readings Not able to exercise for various reasons including recovering from stress  fracture on the foot Again has difficulty losing weight He did not start the CGM as he had a large out-of-pocket expense  Side effects from medications: Nausea from all GLP-1 drugs and diarrhea from [facility]  Meals at work: 1-3 am, at home 9 am or 3 PM  Monitors blood glucose:  1 times a day        Glucometer:  Contour  Blood Glucose readings from meter review  Recent range 81-188 with AVERAGE 136  Previously:  PRE-MEAL Fasting Lunch Dinner 8-10 AM Overall  Glucose range: 110-217  96, 120 122-154   Mean/median:     147   POST-MEAL PC Breakfast PC Lunch PC Dinner  Glucose range:     Mean/median:       Dietician visit: Most recent: 5/19  Wt Readings from Last 3 Encounters:  07/15/23 (!) 301 lb 12.8 oz (136.9 kg)  05/24/23 (!) 305 lb 4.8 oz (138.5 kg)  05/03/23 300 lb (136.1 kg)      Lab Results  Component Value Date   HGBA1C 7.6 (H) 07/05/2023   HGBA1C 7.6 (H) 04/07/2023   HGBA1C 7.9 (H) 01/13/2023   Lab Results  Component Value Date   MICROALBUR 1.1 01/22/2022   LDLCALC 68 01/13/2023   CREATININE 1.12 07/05/2023   Lab Results  Component Value Date   FRUCTOSAMINE 293 (H) 11/16/2022   FRUCTOSAMINE 315 (H) 03/17/2022   FRUCTOSAMINE  294 (H) 02/24/2018    OTHER active problems: See review of systems   Allergies as of 07/15/2023       Reactions   Mounjaro [tirzepatide] Diarrhea, Nausea Only        Medication List        Accurate as of July 15, 2023  8:11 AM. If you have any questions, ask your nurse or doctor.          Alpha Lipoic Acid 200 MG Caps Take 200 mg by mouth. 2 pills daily   amLODipine 5 MG tablet Commonly known as: NORVASC Take 1 tablet (5 mg total) by mouth daily.   aspirin 81 MG tablet Take 81 mg by mouth daily.   B-D UF III MINI PEN NEEDLES 31G X 5 MM Misc Generic drug: Insulin Pen Needle USE DAILY WITH INSULIN PENS   Bayer Microlet Lancets lancets Use to check blood sugar two times per day   CONTOUR NEXT EZ MONITOR  w/Device Kit Use to check blood sugar two times per day   Contour Next Test test strip Generic drug: glucose blood TEST TWICE DAILY   diclofenac Sodium 1 % Gel Commonly known as: VOLTAREN Apply 2 g topically 4 (four) times daily.   empagliflozin 25 MG Tabs tablet Commonly known as: Jardiance Take 1 tablet (25 mg total) by mouth daily.   ferrous sulfate 325 (65 FE) MG tablet Take 1 tablet (325 mg total) by mouth every other day. On Monday, Wednesday, and Friday   gabapentin 600 MG tablet Commonly known as: NEURONTIN TAKE 1 TABLET(600 MG) BY MOUTH THREE TIMES DAILY   glimepiride 1 MG tablet Commonly known as: AMARYL Take 1 tablet before lunch and bedtime   losartan 50 MG tablet Commonly known as: Cozaar Take 1 tablet (50 mg total) by mouth daily.   Magnesium Oxide 400 MG Caps Take 1 capsule (400 mg total) by mouth daily.   metFORMIN 500 MG 24 hr tablet Commonly known as: GLUCOPHAGE-XR TAKE  TABLETS BY MOUTH EVERY DAY WITH SUPPER   multivitamin with minerals Tabs tablet Take 1 tablet by mouth daily.   pantoprazole 40 MG tablet Commonly known as: PROTONIX TAKE 1 TABLET(40 MG) BY MOUTH TWICE DAILY   polyethylene glycol 17 g packet Commonly known as: MiraLax Take 17 g by mouth daily.   rosuvastatin 10 MG tablet Commonly known as: CRESTOR TAKE 1 TABLET(10 MG) BY MOUTH DAILY   sertraline 100 MG tablet Commonly known as: ZOLOFT Take 2 tablets (200 mg total) by mouth daily.   Evaristo Bury FlexTouch 100 UNIT/ML FlexTouch Pen Generic drug: insulin degludec Inject 22 Units into the skin daily. What changed: how much to take   vitamin B-12 500 MCG tablet Commonly known as: CYANOCOBALAMIN Take 500 mcg by mouth daily.   vitamin C 1000 MG tablet Take 1,000 mg by mouth daily.   VITAMIN D PO Take 1 tablet by mouth daily. D3 1000 IU        Allergies:  Allergies  Allergen Reactions   Mounjaro [Tirzepatide] Diarrhea and Nausea Only    Past Medical History:   Diagnosis Date   Depression    Diabetes mellitus without complication (HCC)    Hyperlipidemia    Hypertension    Ischemic colitis [facility])     Past Surgical History:  Procedure Laterality Date   COLONOSCOPY  06/01/2014   NASAL SEPTUM SURGERY      Family History  Problem Relation Age of Onset   Hypertension Mother    Stroke Father  Diabetes Maternal Grandfather    Colon cancer Neg Hx    Throat cancer Neg Hx    Prostate cancer Neg Hx    Pancreatic cancer Neg Hx    Heart disease Neg Hx    Kidney disease Neg Hx    Liver disease Neg Hx     Social History:  reports that he has never smoked. He has never used smokeless tobacco. He reports that he does not drink alcohol and does not use drugs.    Review of Systems      HYPERTENSION: Treated with 50 mg losartan and Norvasc from his PCP  Also on Jardiance He will check blood pressure at work regularly, about 130-140 systolic usually  BP Readings from Last 3 Encounters:  07/15/23 126/80  06/14/23 138/77  05/24/23 (!) 140/106   Renal function as follows:   Lab Results  Component Value Date   CREATININE 1.12 07/05/2023   CREATININE 1.41 04/07/2023   CREATININE 1.34 01/13/2023        Hyperlipidemia: taking CRESTOR 10 mg LDL is below 100 and triglycerides are still high but improved, previously LDL was higher with pravastatin  Lab Results  Component Value Date   CHOL 146 01/13/2023   CHOL 151 07/30/2022   CHOL 131 01/22/2022   Lab Results  Component Value Date   HDL 43.10 01/13/2023   HDL 41.60 07/30/2022   HDL 40.90 01/22/2022   Lab Results  Component Value Date   LDLCALC 68 01/13/2023   LDLCALC 63 04/15/2021   LDLCALC 73 08/09/2020   Lab Results  Component Value Date   TRIG 176.0 (H) 01/13/2023   TRIG 237.0 (H) 07/30/2022   TRIG 201.0 (H) 01/22/2022   Lab Results  Component Value Date   CHOLHDL 3 01/13/2023   CHOLHDL 4 07/30/2022   CHOLHDL 3 01/22/2022   Lab Results  Component Value Date    LDLDIRECT 85.0 07/30/2022   LDLDIRECT 69.0 01/22/2022   LDLDIRECT 106.0 10/16/2021      Lab Results  Component Value Date   ALT 14 01/13/2023     NEUROPATHY He has had symptoms of neuropathy in feet, including burning  He was tried on Lyrica 150 mg but he does not think this works any better than gabapentin He previously was on gabapentin 600 mg 3 capsules a day without adequate control  Had a normal eye exam and was seen by ophthalmologist in 5/23    Physical Examination:  BP 126/80   Pulse 88   Ht 6' (1.829 m)   Wt (!) 301 lb 12.8 oz (136.9 kg)   SpO2 96%   BMI 40.93 kg/m   No pedal edema  ASSESSMENT/PLAN:   Diabetes type 2 with obesity:  See history of present illness for detailed discussion of current diabetes management, blood sugar patterns and problems identified  A1c is 7 .6  Oral medications: Amaryl 1 mg before lunch and bedtime, and Jardiance/metformin  Insulin: Tresiba 24 units daily  Blood sugars are again difficult to analyze because of monitoring mostly at the same time and not clear whether some readings are after meals Recent glucose average 136 at home with readings as low as 81  No consistent postprandial hyperglycemia from the limited data available  As above not able to afford CGM Also not exercising   Plan: Continue home regimen but if he starts waking up with readings below 100 consistently can reduce the [place] by 2 units Consistent diet Keep a regular check on blood sugars 2  hours after meals  If able to start back on walking needs to start exercising He will check with insurance if they will cover the Dexcom sensor and we can supply him with a Dexcom reader  RENAL dysfunction: His creatinine is improved   HYPERLIPIDEMIA: Triglycerides will need to be checked on the next visit  HYPERTENSION: Blood pressure is better controlled now   There are no Patient Instructions on file for this visit.      Reather Littler 07/15/2023, 8:11  AM

## 2023-07-15 NOTE — Patient Instructions (Addendum)
Check on Dexcom sensor  Check blood sugars on waking up 3 days a week  Also check blood sugars about 2 hours after meals and do this after different meals by rotation  Recommended blood sugar levels on waking up are 90-130 and about 2 hours after meal is 130-160  Please bring your blood sugar monitor to each visit, thank you

## 2023-08-03 ENCOUNTER — Other Ambulatory Visit: Payer: Self-pay | Admitting: Endocrinology

## 2023-08-03 DIAGNOSIS — E1142 Type 2 diabetes mellitus with diabetic polyneuropathy: Secondary | ICD-10-CM

## 2023-08-05 ENCOUNTER — Encounter (HOSPITAL_COMMUNITY): Payer: Self-pay | Admitting: Emergency Medicine

## 2023-08-05 ENCOUNTER — Emergency Department (HOSPITAL_COMMUNITY): Payer: Worker's Compensation

## 2023-08-05 ENCOUNTER — Emergency Department (HOSPITAL_COMMUNITY)
Admission: EM | Admit: 2023-08-05 | Discharge: 2023-08-05 | Disposition: A | Discharge status: Home / Self Care | Payer: Worker's Compensation | Attending: Emergency Medicine | Admitting: Emergency Medicine

## 2023-08-05 ENCOUNTER — Other Ambulatory Visit: Payer: Self-pay

## 2023-08-05 DIAGNOSIS — S92351A Displaced fracture of fifth metatarsal bone, right foot, initial encounter for closed fracture: Secondary | ICD-10-CM | POA: Insufficient documentation

## 2023-08-05 DIAGNOSIS — Y990 Civilian activity done for income or pay: Secondary | ICD-10-CM | POA: Insufficient documentation

## 2023-08-05 DIAGNOSIS — W1830XA Fall on same level, unspecified, initial encounter: Secondary | ICD-10-CM | POA: Insufficient documentation

## 2023-08-05 DIAGNOSIS — Z7984 Long term (current) use of oral hypoglycemic drugs: Secondary | ICD-10-CM | POA: Insufficient documentation

## 2023-08-05 DIAGNOSIS — S92352A Displaced fracture of fifth metatarsal bone, left foot, initial encounter for closed fracture: Secondary | ICD-10-CM | POA: Diagnosis not present

## 2023-08-05 DIAGNOSIS — M79672 Pain in left foot: Secondary | ICD-10-CM | POA: Diagnosis present

## 2023-08-05 DIAGNOSIS — Z794 Long term (current) use of insulin: Secondary | ICD-10-CM | POA: Diagnosis not present

## 2023-08-05 DIAGNOSIS — M84375A Stress fracture, left foot, initial encounter for fracture: Secondary | ICD-10-CM

## 2023-08-05 DIAGNOSIS — E119 Type 2 diabetes mellitus without complications: Secondary | ICD-10-CM | POA: Diagnosis not present

## 2023-08-05 DIAGNOSIS — Z7982 Long term (current) use of aspirin: Secondary | ICD-10-CM | POA: Diagnosis not present

## 2023-08-05 NOTE — Discharge Instructions (Addendum)
Use Tylenol every 4 hours in addition to regular icing for pain.  Follow-up with orthopedics for next steps and reevaluation next week. Work note provided until employee health completes evaluation and orthopedics gives detailed recommendations.

## 2023-08-05 NOTE — Progress Notes (Signed)
Orthopedic Tech Progress Note Patient Details:  Jack Wallace May 07, 1961 [id number] Applied CAM Boot per order.  Ortho Devices Type of Ortho Device: CAM walker Ortho Device/Splint Location: LLE Ortho Device/Splint Interventions: Ordered, Application, Adjustment   Post Interventions Patient Tolerated: Well Instructions Provided: Adjustment of device, Care of device  Blase Mess 08/05/2023, 8:23 AM

## 2023-08-05 NOTE — ED Provider Notes (Signed)
[place] EMERGENCY DEPARTMENT AT [facility] Provider Note   CSN: [id number] Arrival date & time: 08/05/23  0430     History  Chief Complaint  Patient presents with   Jack Wallace is a 62 y.o. male.  Patient presents after he fell from standing position at work tonight and now has left foot pain.  Patient has had fractures of that same foot in May.  Patient has diabetes history of neuropathy.  No other significant trauma.  Pain with walking.   Fall Pertinent negatives include no chest pain, no abdominal pain, no headaches and no shortness of breath.       Home Medications Prior to Admission medications   Medication Sig Start Date End Date Taking? Authorizing Provider  Alpha Lipoic Acid 200 MG CAPS Take 200 mg by mouth. 2 pills daily    [provider]  amLODipine (NORVASC) 5 MG tablet Take 1 tablet (5 mg total) by mouth daily. 09/02/22 09/02/23  Masters, Katie, DO  Ascorbic Acid (VITAMIN C) 1000 MG tablet Take 1,000 mg by mouth daily.    [provider]  aspirin 81 MG tablet Take 81 mg by mouth daily.    [provider]  BAYER MICROLET LANCETS lancets Use to check blood sugar two times per day 12/15/16   Reather Littler, MD  Blood Glucose Monitoring Suppl (CONTOUR NEXT EZ MONITOR) w/Device KIT Use to check blood sugar two times per day 12/15/16   Reather Littler, MD  Cholecalciferol (VITAMIN D PO) Take 1 tablet by mouth daily. D3 1000 IU    [provider]  CONTOUR NEXT TEST test strip TEST TWICE DAILY 08/04/23   Reather Littler, MD  diclofenac Sodium (VOLTAREN) 1 % GEL Apply 2 g topically 4 (four) times daily. 05/03/23   Crissie Sickles, MD  empagliflozin (JARDIANCE) 25 MG TABS tablet Take 1 tablet (25 mg total) by mouth daily. 02/23/23   Reather Littler, MD  ferrous sulfate 325 (65 FE) MG tablet Take 1 tablet (325 mg total) by mouth every other day. On Monday, Wednesday, and Friday Patient not taking: Reported on 07/15/2023 08/13/22 08/13/23   Morene Crocker, MD  gabapentin (NEURONTIN) 600 MG tablet TAKE 1 TABLET(600 MG) BY MOUTH THREE TIMES DAILY 04/06/23   Reather Littler, MD  glimepiride (AMARYL) 1 MG tablet Take 1 tablet before lunch and bedtime 05/07/23   Reather Littler, MD  insulin degludec (TRESIBA FLEXTOUCH) 100 UNIT/ML FlexTouch Pen Inject 22 Units into the skin daily. Patient taking differently: Inject 24 Units into the skin daily. 03/22/23   Reather Littler, MD  Insulin Pen Needle (B-D UF III MINI PEN NEEDLES) 31G X 5 MM MISC USE DAILY WITH INSULIN PENS 05/24/23   Reather Littler, MD  losartan (COZAAR) 50 MG tablet Take 1 tablet (50 mg total) by mouth daily. 09/02/22 09/02/23  Masters, Katie, DO  Magnesium Oxide 400 MG CAPS Take 1 capsule (400 mg total) by mouth daily. 12/24/22   Marrianne Mood, MD  metFORMIN (GLUCOPHAGE-XR) 500 MG 24 hr tablet TAKE  TABLETS BY MOUTH EVERY DAY WITH SUPPER 07/06/23   Reather Littler, MD  Multiple Vitamin (MULTIVITAMIN WITH MINERALS) TABS tablet Take 1 tablet by mouth daily.    [provider]  pantoprazole (PROTONIX) 40 MG tablet TAKE 1 TABLET(40 MG) BY MOUTH TWICE DAILY 06/14/23   Willette Cluster, MD  polyethylene glycol (MIRALAX) 17 g packet Take 17 g by mouth daily. 08/13/22   Morene Crocker, MD  rosuvastatin (CRESTOR) 10  MG tablet TAKE 1 TABLET(10 MG) BY MOUTH DAILY 06/16/23   Reather Littler, MD  sertraline (ZOLOFT) 100 MG tablet Take 2 tablets (200 mg total) by mouth daily. 05/24/23 05/23/24  Morene Crocker, MD  vitamin B-12 (CYANOCOBALAMIN) 500 MCG tablet Take 500 mcg by mouth daily.    [provider]      Allergies    Mounjaro [tirzepatide]    Review of Systems   Review of Systems  Constitutional:  Negative for chills and fever.  HENT:  Negative for congestion.   Eyes:  Negative for visual disturbance.  Respiratory:  Negative for shortness of breath.   Cardiovascular:  Negative for chest pain.  Gastrointestinal:  Negative for abdominal pain and vomiting.  Genitourinary:   Negative for dysuria and flank pain.  Musculoskeletal:  Positive for gait problem and joint swelling. Negative for back pain, neck pain and neck stiffness.  Skin:  Negative for rash.  Neurological:  Negative for light-headedness and headaches.    Physical Exam Updated Vital Signs BP (!) 168/98   Pulse 66   Temp 97.7 F (36.5 C)   Resp 20   Wt 136.1 kg   SpO2 100%   BMI 40.69 kg/m  Physical Exam Vitals and nursing note reviewed.  Constitutional:      General: He is not in acute distress.    Appearance: He is well-developed.  HENT:     Head: Normocephalic and atraumatic.     Mouth/Throat:     Mouth: Mucous membranes are moist.  Eyes:     General:        Right eye: No discharge.        Left eye: No discharge.     Conjunctiva/sclera: Conjunctivae normal.  Neck:     Trachea: No tracheal deviation.  Cardiovascular:     Rate and Rhythm: Normal rate.  Pulmonary:     Effort: Pulmonary effort is normal.  Abdominal:     General: There is no distension.     Palpations: Abdomen is soft.  Musculoskeletal:        General: Tenderness present. No swelling.     Cervical back: Normal range of motion. No rigidity.     Comments: Patient has moderate tenderness to palpation of plantar aspect of left distal lateral foot.  No open wounds.  Patient has normal range of motion of left ankle and knee without discomfort.  No signs of infection.  Skin:    General: Skin is warm.     Capillary Refill: Capillary refill takes less than 2 seconds.     Findings: No rash.  Neurological:     General: No focal deficit present.     Mental Status: He is alert.  Psychiatric:        Mood and Affect: Mood normal.     ED Results / Procedures / Treatments   Labs (all labs ordered are listed, but only abnormal results are displayed) Labs Reviewed - No data to display  EKG None  Radiology DG Ankle Complete Left  Result Date: 08/05/2023 CLINICAL DATA:  Injury at work this morning. Left foot pain  and discomfort. EXAM: LEFT ANKLE COMPLETE - 3+ VIEW COMPARISON:  None Available. FINDINGS: No evidence for an acute fracture or dislocation at the ankle. No worrisome lytic or sclerotic osseous abnormality. Fracture at the proximal fifth metatarsal better seen on foot x-rays performed at the same time. IMPRESSION: 1. No acute bony abnormality at the left ankle. 2. Fracture at the proximal fifth metatarsal better  seen on foot x-rays performed at the same time. Electronically Signed   By: Kennith Center M.D.   On: 08/05/2023 05:31   DG Foot Complete Left  Result Date: 08/05/2023 CLINICAL DATA:  Left foot pain and discomfort after injury or. EXAM: LEFT FOOT - COMPLETE 3+ VIEW COMPARISON:  Left foot x-ray 05/03/2023 FINDINGS: Transverse fracture noted through the proximal fifth metatarsal, corresponding to the site of the linear sclerosis seen on the 05/03/2023 exam. Fracture site has nonacute features with some subtle adjacent sclerotic change and this likely represents a chronic stress injury/fracture. No worrisome lytic or sclerotic osseous abnormality. IMPRESSION: Transverse fracture through the proximal fifth metatarsal, likely chronic stress injury/fracture. Electronically Signed   By: Kennith Center M.D.   On: 08/05/2023 05:30    Procedures Procedures    Medications Ordered in ED Medications - No data to display  ED Course/ Medical Decision Making/ A&P                                 Medical Decision Making Amount and/or Complexity of Data Reviewed Radiology: ordered.   Patient presents with left foot pain since low risk fall at work today.  X-ray independently reviewed showing concern for stress fracture.  Patient's had injury and fracture on this foot in the past.  Discussed cam boot with orthopedic technician and follow-up with orthopedics.  Work note provided.  Patient comfortable plan.        Final Clinical Impression(s) / ED Diagnoses Final diagnoses:  Stress fracture of  metatarsal bone of left foot, initial encounter    Rx / DC Orders ED Discharge Orders     None         Blane Ohara, MD 08/05/23 [phone]

## 2023-08-05 NOTE — ED Notes (Signed)
Ortho paged. 

## 2023-08-05 NOTE — ED Triage Notes (Signed)
Patient reports he fell from a standing position at work tonight and is now c/o left foot pain.  Patient reports recent fracture to that same foot in May.

## 2023-08-11 ENCOUNTER — Encounter: Payer: Self-pay | Admitting: Family

## 2023-08-11 ENCOUNTER — Telehealth: Payer: Self-pay | Admitting: Family

## 2023-08-11 ENCOUNTER — Telehealth: Payer: Self-pay | Admitting: Physician Assistant

## 2023-08-11 ENCOUNTER — Ambulatory Visit (INDEPENDENT_AMBULATORY_CARE_PROVIDER_SITE_OTHER): Payer: Worker's Compensation | Admitting: Family

## 2023-08-11 ENCOUNTER — Ambulatory Visit: Payer: Worker's Compensation | Admitting: Physician Assistant

## 2023-08-11 DIAGNOSIS — S92302A Fracture of unspecified metatarsal bone(s), left foot, initial encounter for closed fracture: Secondary | ICD-10-CM

## 2023-08-11 NOTE — Telephone Encounter (Signed)
WC agent Monica would like Erin to give her a call regarding the pt please advise her number is [phone], line secure to leave VM

## 2023-08-11 NOTE — Telephone Encounter (Signed)
Pt failed to imform [place] this was WC when setting appt and worker making appt did not verify if it was or not, when advised he would need his first visit with a Dr and not a PA he was upset and said he will just file it under his insurance I informed him WC may deny claim if filed under insurance he was upset about that too and decided to leave, informed pt i was only trying to inform him so his WC claim did not get denied and he did not care. --Spoke to [facility] agent informed her of situation and printed off documentation of incident, she informed me that his WC claim would most likely be denied anyway being this was a previous injury.

## 2023-08-11 NOTE — Progress Notes (Signed)
Office Visit Note   Patient: Jack Wallace           Date of Birth: 1961-03-07           MRN: [id number] Visit Date: 08/11/2023              Requested by: Kathleen Lime, MD [address] [place],  [place] [address] PCP: Kathleen Lime, MD  Chief Complaint  Patient presents with   Left Foot - Pain      HPI: The patient is a 62 year old gentleman who is seen today for evaluation of left foot pain.  The patient is on his feet for work he reports that he had gradual onset of pain back in May was concerned for injury to the foot did have radiographs at the time which were revealing for a stress fracture.  He had a period of rest from work and did use a postop shoe went back to work about 3 weeks later at that time was pain-free.  Had no issues until last week on August 15 when he fell from a standing position forward and upon standing had immediate onset of pain to the lateral column of the left foot had a visit to the emergency department radiographs were performed  Fracture seen to the fifth metatarsal shaft was placed in a cam walker  Denies pain or issues in his cam boot.  This is a work comp case.  Assessment & Plan: Visit Diagnoses: No diagnosis found.  Plan: He will continue his cam for the next 3 weeks.  May return to work for seated light duty work.  Note provided.  He is using Tylenol as needed for pain  Follow-Up Instructions: Return in about 3 weeks (around 09/01/2023).   Ortho Exam  Patient is alert, oriented, no adenopathy, well-dressed, normal affect, normal respiratory effort. On examination of the left foot there is no edema no erythema no ecchymosis is point tender to the fifth metatarsal shaft.  There is a palpable dorsalis pedis pulse.   Imaging: No results found. No images are attached to the encounter.  Labs: Lab Results  Component Value Date   HGBA1C 7.6 (H) 07/05/2023   HGBA1C 7.6 (H) 04/07/2023   HGBA1C 7.9 (H) 01/13/2023   REPTSTATUS 05/14/2014  FINAL 05/07/2014   CULT  05/07/2014    NO GROWTH 5 DAYS Performed at [facility]     Lab Results  Component Value Date   ALBUMIN 4.7 01/13/2023   ALBUMIN 4.9 01/22/2022   ALBUMIN 4.7 10/16/2021    Lab Results  Component Value Date   MG 1.5 (L) 12/23/2022   MG 1.8 09/01/2022   MG 1.3 (L) 08/26/2022   No results found for: "VD25OH"  No results found for: "PREALBUMIN"    Latest Ref Rng & Units 08/26/2022    4:14 AM 07/07/2022    9:23 AM 01/06/2022   11:37 AM  CBC EXTENDED  WBC 4.0 - 10.5 K/uL 9.0  7.7  8.3   RBC 4.22 - 5.81 MIL/uL 5.16  5.03  5.14   Hemoglobin 13.0 - 17.0 g/dL 53.6  64.4  03.4   HCT 39.0 - 52.0 % 44.1  39.9  41.6   Platelets 150 - 400 K/uL 280  295  294   NEUT# 1.4 - 7.0 x10E3/uL   3.7   Lymph# 0.7 - 3.1 x10E3/uL   3.6      There is no height or weight on file to calculate BMI.  Orders:  No orders of the defined types were placed in this encounter.  No orders of the defined types were placed in this encounter.    Procedures: No procedures performed  Clinical Data: No additional findings.  ROS:  All other systems negative, except as noted in the HPI. Review of Systems  Objective: Vital Signs: There were no vitals taken for this visit.  Specialty Comments:  No specialty comments available.  PMFS History: Patient Active Problem List   Diagnosis Date Noted   Stress fracture of metatarsal bone of left foot 05/24/2023   Bilateral leg pain 07/03/2022   Hyperkalemia 01/07/2022   Pain of joint of left ankle and foot 04/01/2018   MDD (major depressive disorder) 11/13/2015   GERD (gastroesophageal reflux disease) 08/20/2015   Chronic nausea 03/29/2015   Preventative health care 08/23/2014   Diabetic neuropathy (HCC) 08/01/2013   Hypertension associated with diabetes (HCC) 07/31/2013   Hyperlipidemia 07/31/2013   Type 2 diabetes mellitus with peripheral neuropathy (HCC) 07/31/1992   Past Medical History:  Diagnosis Date    Depression    Diabetes mellitus without complication (HCC)    Hyperlipidemia    Hypertension    Ischemic colitis (HCC)     Family History  Problem Relation Age of Onset   Hypertension Mother    Stroke Father    Diabetes Maternal Grandfather    Colon cancer Neg Hx    Throat cancer Neg Hx    Prostate cancer Neg Hx    Pancreatic cancer Neg Hx    Heart disease Neg Hx    Kidney disease Neg Hx    Liver disease Neg Hx     Past Surgical History:  Procedure Laterality Date   COLONOSCOPY  06/01/2014   NASAL SEPTUM SURGERY     Social History   Occupational History   Occupation: Midwife  Tobacco Use   Smoking status: Never   Smokeless tobacco: Never  Substance and Sexual Activity   Alcohol use: No   Drug use: No   Sexual activity: Not on file

## 2023-08-12 NOTE — Telephone Encounter (Signed)
I SW [employer] and advised her that we did not carry any cam walkers that had toe coverings. 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   Per Denny Peon later she said they could try to contact any DME store and see if they can make a customized one for him. Will reach out to her again and let her know if this is something they would like to investigate.

## 2023-08-24 ENCOUNTER — Other Ambulatory Visit: Payer: Self-pay | Admitting: Internal Medicine

## 2023-08-24 DIAGNOSIS — I10 Essential (primary) hypertension: Secondary | ICD-10-CM

## 2023-08-26 ENCOUNTER — Other Ambulatory Visit: Payer: Self-pay

## 2023-08-26 ENCOUNTER — Encounter: Payer: Self-pay | Admitting: Endocrinology

## 2023-08-26 DIAGNOSIS — E1165 Type 2 diabetes mellitus with hyperglycemia: Secondary | ICD-10-CM

## 2023-08-26 MED ORDER — INSULIN PEN NEEDLE 32G X 5 MM MISC
1.0000 | Freq: Every day | 1 refills | Status: DC
Start: 2023-08-26 — End: 2023-10-29

## 2023-09-01 ENCOUNTER — Encounter: Payer: Self-pay | Admitting: Family

## 2023-09-01 ENCOUNTER — Ambulatory Visit (INDEPENDENT_AMBULATORY_CARE_PROVIDER_SITE_OTHER): Payer: Worker's Compensation | Admitting: Family

## 2023-09-01 ENCOUNTER — Other Ambulatory Visit (INDEPENDENT_AMBULATORY_CARE_PROVIDER_SITE_OTHER): Payer: Worker's Compensation

## 2023-09-01 DIAGNOSIS — S92302A Fracture of unspecified metatarsal bone(s), left foot, initial encounter for closed fracture: Secondary | ICD-10-CM | POA: Diagnosis not present

## 2023-09-01 NOTE — Progress Notes (Signed)
Office Visit Note   Patient: Jack Wallace           Date of Birth: 10-05-1961           MRN: [id number] Visit Date: 09/01/2023              Requested by: Kathleen Lime, MD [address] [place],  [place] [address] PCP: Kathleen Lime, MD  Chief Complaint  Patient presents with   Left Foot - Fracture, Follow-up      HPI: The patient is a 62 year old gentleman who is seen today in follow up for left 5th metatarsal fracture.  The patient is on his feet for work he reports that he had gradual onset of pain back in May was concerned for injury to the foot did have radiographs at the time which were revealing for a stress fracture.  He had a period of rest from work and did use a postop shoe went back to work about 3 weeks later at that time was pain-free.  Had no issues until last week on August 15 when he fell from a standing position forward and upon standing had immediate onset of pain to the lateral column of the left foot had a visit to the emergency department radiographs were performed. Fracture seen to the fifth metatarsal shaft was placed in a cam walker  This is a work comp case.  09/01/23:  Reports ongoing aching pain has been out of work for could not find any seated work.  He reports that he is bit down quite a bit of nonweightbearing.  Does continue to have some aching pain along the lateral column.  No swelling or bruising  Does have diabetes. Assessment & Plan: Visit Diagnoses:  1. Closed fracture of shaft of metatarsal bone of left foot, initial encounter     Plan: He will continue his cam for the next 2 weeks.  Will continue out of work. Note provided.  He is using Tylenol as needed for pain  Radiographs of foot at follow up, may require ORIF if we do not see interval callus formation. Patient in agreement with plan.  The same is present with loss of  Follow-Up Instructions: No follow-ups on file.   Ortho Exam  Patient is alert, oriented, no adenopathy,  well-dressed, normal affect, normal respiratory effort. On examination of the left foot there is no edema no erythema no ecchymosis.  Continues to be point tender to the fifth metatarsal shaft.  There is a palpable dorsalis pedis pulse.   Imaging: No results found. No images are attached to the encounter.  Labs: Lab Results  Component Value Date   HGBA1C 7.6 (H) 07/05/2023   HGBA1C 7.6 (H) 04/07/2023   HGBA1C 7.9 (H) 01/13/2023   REPTSTATUS 05/14/2014 FINAL 05/07/2014   CULT  05/07/2014    NO GROWTH 5 DAYS Performed at [facility]     Lab Results  Component Value Date   ALBUMIN 4.7 01/13/2023   ALBUMIN 4.9 01/22/2022   ALBUMIN 4.7 10/16/2021    Lab Results  Component Value Date   MG 1.5 (L) 12/23/2022   MG 1.8 09/01/2022   MG 1.3 (L) 08/26/2022   No results found for: "VD25OH"  No results found for: "PREALBUMIN"    Latest Ref Rng & Units 08/26/2022    4:14 AM 07/07/2022    9:23 AM 01/06/2022   11:37 AM  CBC EXTENDED  WBC 4.0 - 10.5 K/uL 9.0  7.7  8.3  RBC 4.22 - 5.81 MIL/uL 5.16  5.03  5.14   Hemoglobin 13.0 - 17.0 g/dL 16.1  09.6  04.5   HCT 39.0 - 52.0 % 44.1  39.9  41.6   Platelets 150 - 400 K/uL 280  295  294   NEUT# 1.4 - 7.0 x10E3/uL   3.7   Lymph# 0.7 - 3.1 x10E3/uL   3.6      There is no height or weight on file to calculate BMI.  Orders:  Orders Placed This Encounter  Procedures   XR Foot Complete Left   No orders of the defined types were placed in this encounter.    Procedures: No procedures performed  Clinical Data: No additional findings.  ROS:  All other systems negative, except as noted in the HPI. Review of Systems  Objective: Vital Signs: There were no vitals taken for this visit.  Specialty Comments:  No specialty comments available.  PMFS History: Patient Active Problem List   Diagnosis Date Noted   Stress fracture of metatarsal bone of left foot 05/24/2023   Bilateral leg pain 07/03/2022   Hyperkalemia  01/07/2022   Pain of joint of left ankle and foot 04/01/2018   MDD (major depressive disorder) 11/13/2015   GERD (gastroesophageal reflux disease) 08/20/2015   Chronic nausea 03/29/2015   Preventative health care 08/23/2014   Diabetic neuropathy (HCC) 08/01/2013   Hypertension associated with diabetes (HCC) 07/31/2013   Hyperlipidemia 07/31/2013   Type 2 diabetes mellitus with peripheral neuropathy (HCC) 07/31/1992   Past Medical History:  Diagnosis Date   Depression    Diabetes mellitus without complication (HCC)    Hyperlipidemia    Hypertension    Ischemic colitis (HCC)     Family History  Problem Relation Age of Onset   Hypertension Mother    Stroke Father    Diabetes Maternal Grandfather    Colon cancer Neg Hx    Throat cancer Neg Hx    Prostate cancer Neg Hx    Pancreatic cancer Neg Hx    Heart disease Neg Hx    Kidney disease Neg Hx    Liver disease Neg Hx     Past Surgical History:  Procedure Laterality Date   COLONOSCOPY  06/01/2014   NASAL SEPTUM SURGERY     Social History   Occupational History   Occupation: Midwife  Tobacco Use   Smoking status: Never   Smokeless tobacco: Never  Substance and Sexual Activity   Alcohol use: No   Drug use: No   Sexual activity: Not on file

## 2023-09-15 ENCOUNTER — Ambulatory Visit: Payer: BC Managed Care – PPO | Admitting: Family

## 2023-09-16 ENCOUNTER — Ambulatory Visit: Payer: Worker's Compensation | Admitting: Orthopedic Surgery

## 2023-09-16 ENCOUNTER — Telehealth: Payer: Self-pay | Admitting: *Deleted

## 2023-09-16 ENCOUNTER — Other Ambulatory Visit (INDEPENDENT_AMBULATORY_CARE_PROVIDER_SITE_OTHER): Payer: Worker's Compensation

## 2023-09-16 ENCOUNTER — Emergency Department (HOSPITAL_COMMUNITY)
Admission: EM | Admit: 2023-09-16 | Discharge: 2023-09-16 | Disposition: A | Discharge status: Home / Self Care | Payer: BC Managed Care – PPO

## 2023-09-16 ENCOUNTER — Encounter (HOSPITAL_COMMUNITY): Payer: Self-pay

## 2023-09-16 ENCOUNTER — Emergency Department (HOSPITAL_COMMUNITY): Payer: BC Managed Care – PPO

## 2023-09-16 ENCOUNTER — Encounter: Payer: Self-pay | Admitting: Orthopedic Surgery

## 2023-09-16 ENCOUNTER — Other Ambulatory Visit: Payer: Self-pay

## 2023-09-16 VITALS — BP 126/92 | HR 140

## 2023-09-16 DIAGNOSIS — Z7982 Long term (current) use of aspirin: Secondary | ICD-10-CM | POA: Insufficient documentation

## 2023-09-16 DIAGNOSIS — R110 Nausea: Secondary | ICD-10-CM | POA: Insufficient documentation

## 2023-09-16 DIAGNOSIS — Z79899 Other long term (current) drug therapy: Secondary | ICD-10-CM | POA: Diagnosis not present

## 2023-09-16 DIAGNOSIS — R000 Tachycardia, unspecified: Secondary | ICD-10-CM | POA: Insufficient documentation

## 2023-09-16 DIAGNOSIS — M79672 Pain in left foot: Secondary | ICD-10-CM

## 2023-09-16 DIAGNOSIS — Z7984 Long term (current) use of oral hypoglycemic drugs: Secondary | ICD-10-CM | POA: Insufficient documentation

## 2023-09-16 DIAGNOSIS — M79661 Pain in right lower leg: Secondary | ICD-10-CM | POA: Insufficient documentation

## 2023-09-16 DIAGNOSIS — I10 Essential (primary) hypertension: Secondary | ICD-10-CM | POA: Insufficient documentation

## 2023-09-16 DIAGNOSIS — Z794 Long term (current) use of insulin: Secondary | ICD-10-CM | POA: Diagnosis not present

## 2023-09-16 DIAGNOSIS — M79662 Pain in left lower leg: Secondary | ICD-10-CM | POA: Insufficient documentation

## 2023-09-16 DIAGNOSIS — E119 Type 2 diabetes mellitus without complications: Secondary | ICD-10-CM | POA: Diagnosis not present

## 2023-09-16 DIAGNOSIS — S99192A Other physeal fracture of left metatarsal, initial encounter for closed fracture: Secondary | ICD-10-CM | POA: Diagnosis not present

## 2023-09-16 LAB — BASIC METABOLIC PANEL
Anion gap: 13 (ref 5–15)
BUN: 19 mg/dL (ref 8–23)
CO2: 21 mmol/L — ABNORMAL LOW (ref 22–32)
Calcium: 9.9 mg/dL (ref 8.9–10.3)
Chloride: 102 mmol/L (ref 98–111)
Creatinine, Ser: 1.32 mg/dL — ABNORMAL HIGH (ref 0.61–1.24)
GFR, Estimated: >60 mL/min (ref >60)
Glucose, Bld: 240 mg/dL — ABNORMAL HIGH (ref 70–99)
Potassium: 4 mmol/L (ref 3.5–5.1)
Sodium: 136 mmol/L (ref 135–145)

## 2023-09-16 LAB — TROPONIN I (HIGH SENSITIVITY)
Troponin I (High Sensitivity): 10 ng/L (ref <18)
Troponin I (High Sensitivity): 8 ng/L (ref <18)

## 2023-09-16 LAB — CBC
HCT: 51.5 % (ref 39.0–52.0)
Hemoglobin: 16.5 g/dL (ref 13.0–17.0)
MCH: 28.8 pg (ref 26.0–34.0)
MCHC: 32 g/dL (ref 30.0–36.0)
MCV: 89.9 fL (ref 80.0–100.0)
Platelets: 260 10*3/uL (ref 150–400)
RBC: 5.73 MIL/uL (ref 4.22–5.81)
RDW: 13.7 % (ref 11.5–15.5)
WBC: 8.5 10*3/uL (ref 4.0–10.5)
nRBC: 0 % (ref 0.0–0.2)

## 2023-09-16 LAB — D-DIMER, QUANTITATIVE: D-Dimer, Quant: <0.27 ug/mL-FEU (ref 0.00–0.50)

## 2023-09-16 LAB — TSH: TSH: 2.806 u[IU]/mL (ref 0.350–4.500)

## 2023-09-16 LAB — T4, FREE: Free T4: 0.95 ng/dL (ref 0.61–1.12)

## 2023-09-16 MED ORDER — SODIUM CHLORIDE 0.9 % IV BOLUS
1000.0000 mL | Freq: Once | INTRAVENOUS | Status: AC
  Administered 2023-09-16: 1000 mL via INTRAVENOUS

## 2023-09-16 NOTE — Discharge Instructions (Signed)
You have been seen today for your complaint of tachycardia. Your lab work was reassuring. Your imaging was reassuring. Home care instructions are as follows:  Drink plenty of water Follow up with: Your PCP in 1 week for reevaluation.  Follow-up with cardiology Please seek immediate medical care if you develop any of the following symptoms: You have pain in your chest, upper arms, jaw, or neck. You have palpitations that do not go away. At this time there does not appear to be the presence of an emergent medical condition, however there is always the potential for conditions to change. Please read and follow the below instructions.  Do not take your medicine if  develop an itchy rash, swelling in your mouth or lips, or difficulty breathing; call 911 and seek immediate emergency medical attention if this occurs.  You may review your lab tests and imaging results in their entirety on your MyChart account.  Please discuss all results of fully with your primary care provider and other specialist at your follow-up visit.  Note: Portions of this text may have been transcribed using voice recognition software. Every effort was made to ensure accuracy; however, inadvertent computerized transcription errors may still be present.

## 2023-09-16 NOTE — Telephone Encounter (Signed)
Patient walked in to Clinics.  Stated just left Dr. Audrie Lia office.. HR was 140 was told to go see his PCP or go to the ER immediately.   Patient had c/o of some nausea and dizziness as well. Spoke with Dr. Lafonda Mosses.  Patient was transported to the ER via Wheelchair.

## 2023-09-16 NOTE — ED Notes (Signed)
New green top sent to lab

## 2023-09-16 NOTE — ED Provider Notes (Signed)
[place] EMERGENCY DEPARTMENT AT [facility] Provider Note   CSN: [id number] Arrival date & time: 09/16/23  1034     History  Chief Complaint  Patient presents with   Tachycardia    Jack Wallace is a 62 y.o. male.  With a history of diabetes, hypertension, hyperlipidemia, depression presenting to the ED via his orthopedic surgeon's office for evaluation of tachycardia.  He is scheduled to undergo ORIF of the fifth metatarsal of the left foot.  Was seen in office today and sent here for intermittent hypoxia and persistent tachycardia.  He reports he feels slightly nauseous right now which is his baseline.  He feels otherwise normal.  He states that due to the left foot pain he has not been ambulating much at all over the past 4 weeks, however when he does walk he feels more short of breath than normal.  He denies any cough or hemoptysis.  Reports bilateral calf pain at baseline but denies any recent change.  No unilateral erythema or swelling of the calves.  No chest pain.  No fevers or chills. HPI     Home Medications Prior to Admission medications   Medication Sig Start Date End Date Taking? Authorizing Provider  Alpha Lipoic Acid 200 MG CAPS Take 200 mg by mouth. 2 pills daily    [provider]  amLODipine (NORVASC) 5 MG tablet TAKE 1 TABLET(5 MG) BY MOUTH DAILY 08/25/23   Kathleen Lime, MD  Ascorbic Acid (VITAMIN C) 1000 MG tablet Take 1,000 mg by mouth daily.    [provider]  aspirin 81 MG tablet Take 81 mg by mouth daily.    [provider]  BAYER MICROLET LANCETS lancets Use to check blood sugar two times per day 12/15/16   Reather Littler, MD  Blood Glucose Monitoring Suppl (CONTOUR NEXT EZ MONITOR) w/Device KIT Use to check blood sugar two times per day 12/15/16   Reather Littler, MD  Cholecalciferol (VITAMIN D PO) Take 1 tablet by mouth daily. D3 1000 IU    [provider]  CONTOUR NEXT TEST test strip TEST TWICE DAILY 08/04/23    Reather Littler, MD  diclofenac Sodium (VOLTAREN) 1 % GEL Apply 2 g topically 4 (four) times daily. 05/03/23   Crissie Sickles, MD  empagliflozin (JARDIANCE) 25 MG TABS tablet Take 1 tablet (25 mg total) by mouth daily. 02/23/23   Reather Littler, MD  ferrous sulfate 325 (65 FE) MG tablet Take 1 tablet (325 mg total) by mouth every other day. On Monday, Wednesday, and Friday Patient not taking: Reported on 07/15/2023 08/13/22 08/13/23  Morene Crocker, MD  gabapentin (NEURONTIN) 600 MG tablet TAKE 1 TABLET(600 MG) BY MOUTH THREE TIMES DAILY 04/06/23   Reather Littler, MD  glimepiride (AMARYL) 1 MG tablet Take 1 tablet before lunch and bedtime 05/07/23   Reather Littler, MD  insulin degludec (TRESIBA FLEXTOUCH) 100 UNIT/ML FlexTouch Pen Inject 22 Units into the skin daily. Patient taking differently: Inject 24 Units into the skin daily. 03/22/23   Reather Littler, MD  Insulin Pen Needle (B-D UF III MINI PEN NEEDLES) 31G X 5 MM MISC USE DAILY WITH INSULIN PENS 05/24/23   Reather Littler, MD  Insulin Pen Needle 32G X 5 MM MISC 1 Needle by Does not apply route daily. Use Daily with Insulin Pen 08/26/23   Th[place], [place], MD  losartan (COZAAR) 50 MG tablet TAKE 1 TABLET(50 MG) BY MOUTH DAILY 08/25/23   Kathleen Lime, MD  Magnesium Oxide 400 MG  CAPS Take 1 capsule (400 mg total) by mouth daily. 12/24/22   Marrianne Mood, MD  metFORMIN (GLUCOPHAGE-XR) 500 MG 24 hr tablet TAKE  TABLETS BY MOUTH EVERY DAY WITH SUPPER 07/06/23   Reather Littler, MD  Multiple Vitamin (MULTIVITAMIN WITH MINERALS) TABS tablet Take 1 tablet by mouth daily.    [provider]  pantoprazole (PROTONIX) 40 MG tablet TAKE 1 TABLET(40 MG) BY MOUTH TWICE DAILY 06/14/23   Willette Cluster, MD  polyethylene glycol (MIRALAX) 17 g packet Take 17 g by mouth daily. 08/13/22   Morene Crocker, MD  rosuvastatin (CRESTOR) 10 MG tablet TAKE 1 TABLET(10 MG) BY MOUTH DAILY 06/16/23   Reather Littler, MD  sertraline (ZOLOFT) 100 MG tablet Take 2 tablets (200 mg total) by mouth  daily. 05/24/23 05/23/24  Morene Crocker, MD  vitamin B-12 (CYANOCOBALAMIN) 500 MCG tablet Take 500 mcg by mouth daily.    [provider]      Allergies    Greggory Keen [tirzepatide]    Review of Systems   Review of Systems  Gastrointestinal:  Positive for nausea.  All other systems reviewed and are negative.   Physical Exam Updated Vital Signs BP 120/89   Pulse (!) 106   Temp 98.9 F (37.2 C)   Resp (!) 21   SpO2 97%  Physical Exam Vitals and nursing note reviewed.  Constitutional:      General: He is not in acute distress.    Appearance: Normal appearance. He is well-developed. He is obese. He is not ill-appearing.     Comments: Resting comfortably in bed  HENT:     Head: Normocephalic and atraumatic.  Eyes:     Conjunctiva/sclera: Conjunctivae normal.  Cardiovascular:     Rate and Rhythm: Regular rhythm. Tachycardia present.     Heart sounds: No murmur heard. Pulmonary:     Effort: Pulmonary effort is normal. No respiratory distress.     Breath sounds: Normal breath sounds. No wheezing, rhonchi or rales.  Abdominal:     General: Abdomen is flat.     Palpations: Abdomen is soft.     Tenderness: There is no abdominal tenderness. There is no guarding.  Musculoskeletal:        General: No swelling. Normal range of motion.     Cervical back: Neck supple.     Right lower leg: No edema.     Left lower leg: No edema.     Comments: No lower extremity erythema  Skin:    General: Skin is warm and dry.     Capillary Refill: Capillary refill takes less than 2 seconds.  Neurological:     Mental Status: He is alert and oriented to person, place, and time.  Psychiatric:        Mood and Affect: Mood normal.        Behavior: Behavior normal.     ED Results / Procedures / Treatments   Labs (all labs ordered are listed, but only abnormal results are displayed) Labs Reviewed  BASIC METABOLIC PANEL - Abnormal; Notable for the following components:      Result  Value   CO2 21 (*)    Glucose, Bld 240 (*)    Creatinine, Ser 1.32 (*)    All other components within normal limits  CBC  D-DIMER, QUANTITATIVE  TSH  T4, FREE  TROPONIN I (HIGH SENSITIVITY)  TROPONIN I (HIGH SENSITIVITY)    EKG EKG Interpretation Date/Time:  Thursday September 16 2023 11:35:40 EDT Ventricular Rate:  117 PR  Interval:  176 QRS Duration:  85 QT Interval:  318 QTC Calculation: 444 R Axis:   66  Text Interpretation: Sinus tachycardia Low voltage, precordial leads Borderline T abnormalities, inferior leads Confirmed by Estanislado Pandy [phone]) on 09/16/2023 11:58:43 AM  Radiology DG Chest 2 View  Result Date: 09/16/2023 CLINICAL DATA:  Tachycardia EXAM: CHEST - 2 VIEW COMPARISON:  08/26/2022 FINDINGS: The heart size and mediastinal contours are within normal limits. Both lungs are clear. Disc degenerative disease of the thoracic spine. IMPRESSION: No acute abnormality of the lungs. Electronically Signed   By: Jearld Lesch M.D.   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   Procedures Procedures    Medications Ordered in ED Medications  sodium chloride 0.9 % bolus 1,000 mL (0 mLs Intravenous Stopped 09/16/23 1442)  sodium chloride 0.9 % bolus 1,000 mL (0 mLs Intravenous Stopped 09/16/23 1442)    ED Course/ Medical Decision Making/ A&P                                 Medical Decision Making Amount and/or Complexity of Data Reviewed Labs: ordered. Radiology: ordered.   This patient presents to the ED for concern of tachycardia, this involves an extensive number of treatment options, and is a complaint that carries with it a high risk of complications and morbidity.  Differential diagnosis includes ACS, PE, thyroid dysfunction, electrolyte abnormality, arrhythmia  My initial workup includes labs,  imaging, fluids  Additional history obtained from: Nursing notes from this visit. Previous records within EMR system office visit just prior to arrival with Dr. Lajoyce Corners, intermittent office and ED visits over the past 6 months with borderline tachycardia with rates between 90 and 105 bpm  I ordered, reviewed and interpreted labs which include: CBC, BMP, troponin, D-dimer, TSH, T4.  CBC without leukocytosis or anemia.  D-dimer negative..  Initial and delta troponin negative  I ordered imaging studies including chest x-ray I independently visualized and interpreted imaging which showed negative I agree with the radiologist interpretation  Cardiac Monitoring:  The patient was maintained on a cardiac monitor.  I personally viewed and interpreted the cardiac monitored which showed an underlying rhythm of: Sinus tachycardia  Afebrile, tachycardic which improved throughout his stay but otherwise hemodynamically stable.  62 year old male presenting for evaluation of tachycardia.  He was seen in office by Dr. Lajoyce Corners and found to have tachycardia in the 140s.  On initial evaluation, he was tachycardic to 130.  This appears to be sinus tachycardia.  He was given 2 L of IV fluids with significant improvement in his heart rate to approximately 100 to 105 bpm.  On chart review, he has numerous readings over the past year between 85 and 105 bpm.  He denies any symptoms and reports he feels at baseline.  Lab workup is reassuring.  No electrolyte abnormalities, normal troponins, negative D-dimer, no fevers or leukocytosis to suggest an infection.  Chest x-ray was negative.  Unclear etiology of his tachycardia, however suspect he has some dehydration given the amount of improvement in his symptoms after fluid resuscitation.  Ambulatory referral to cardiology was placed.  He was encouraged to follow-up with cardiology for further evaluation of his tachycardia.  He was given return precautions.  Stable at discharge.  At  this time there does not appear to be any evidence of an acute emergency medical condition and the patient appears stable for discharge with appropriate outpatient follow up. Diagnosis was discussed with patient who verbalizes understanding of care plan and is agreeable to discharge. I have discussed return precautions with patient who verbalizes understanding. Patient encouraged to follow-up with their PCP within 1 week, cardiology as soon as possible. All questions answered.  Patient's case discussed with Dr. Maple Hudson who agrees with plan to discharge with follow-up.   Note: Portions of this report may have been transcribed using voice recognition software. Every effort was made to ensure accuracy; however, inadvertent computerized transcription errors may still be present.        Final Clinical Impression(s) / ED Diagnoses Final diagnoses:  Tachycardia    Rx / DC Orders ED Discharge Orders          Ordered    Ambulatory referral to Cardiology       Comments: If you have not heard from the Cardiology office within the next 72 hours please call [phone].   09/16/23 1454              Michelle Piper, PA-C 09/16/23 1454    Coral Spikes, DO 09/16/23 1601

## 2023-09-16 NOTE — ED Triage Notes (Signed)
Pt sent by ortho doctor, because heart rate was high in office. Pt states his heart rate was 140 in the office. Pt c/o [id number]

## 2023-09-16 NOTE — Progress Notes (Signed)
Office Visit Note   Patient: Jack Wallace           Date of Birth: [birth year]           MRN: [id number] Visit Date: 09/16/2023              Requested by: Kathleen Lime, MD [address] [place],  [place] [address] PCP: Kathleen Lime, MD  Chief Complaint  Patient presents with   Left Foot - Follow-up    Left foot 5th MT fracture       HPI: Patient is a 62 year old gentleman who is seen for initial evaluation for Jones fracture left foot.  Patient initially sustained a stress fracture 4 months ago in May was placed in a postoperative shoe.  Patient subsequently fell at work on August 15 with increased pain approximately 4 weeks ago.  Patient has been out of work in a fracture boot since that time.  Assessment & Plan: Visit Diagnoses:  1. Pain in left foot   2. Closed fracture of base of fifth metatarsal bone of left foot at metaphyseal-diaphyseal junction, initial encounter     Plan: Patient has heart rate of 140 with decreased oxygen saturation.  Recommended urgent follow-up with primary care or the emergency room at this time.  Once patient is cleared from the cardiac standpoint we will proceed with open reduction internal fixation of the Jones fracture.  Patient will need to be out of work until this is healed.  Follow-Up Instructions: No follow-ups on file.   Ortho Exam  Patient is alert, oriented, no adenopathy, well-dressed, normal affect, normal respiratory effort. Examination patient has a palpable dorsalis pedis and posterior tibial pulse patient does have tachycardia.  The blood pressure and heart rate was checked and patient has O2 sats approximately in the high 80s and low 90s on room air with a heart rate of 140 and maintaining blood pressure.  Patient is asymptomatic but states he feels a little diaphoretic.  Denies any chest pain.  Patient is tender to palpation of the base of the fifth metatarsal radiograph shows a nonunion of the Jones fracture of the base of the  fifth metatarsal metaphyseal diaphyseal junction.  There is no redness no cellulitis no signs of infection.  Imaging: XR Foot 2 Views Left  Result Date: 09/16/2023 2 view radiographs of the left foot shows a nonunion Jones fracture at the junction of the metaphyseal diaphysis of the fifth metatarsal.  No images are attached to the encounter.  Labs: Lab Results  Component Value Date   HGBA1C 7.6 (H) 07/05/2023   HGBA1C 7.6 (H) 04/07/2023   HGBA1C 7.9 (H) 01/13/2023   REPTSTATUS 05/14/2014 FINAL 05/07/2014   CULT  05/07/2014    NO GROWTH 5 DAYS Performed at [facility]     Lab Results  Component Value Date   ALBUMIN 4.7 01/13/2023   ALBUMIN 4.9 01/22/2022   ALBUMIN 4.7 10/16/2021    Lab Results  Component Value Date   MG 1.5 (L) 12/23/2022   MG 1.8 09/01/2022   MG 1.3 (L) 08/26/2022   No results found for: "VD25OH"  No results found for: "PREALBUMIN"    Latest Ref Rng & Units 08/26/2022    4:14 AM 07/07/2022    9:23 AM 01/06/2022   11:37 AM  CBC EXTENDED  WBC 4.0 - 10.5 K/uL 9.0  7.7  8.3   RBC 4.22 - 5.81 MIL/uL 5.16  5.03  5.14   Hemoglobin 13.0 - 17.0  g/dL 52.8  41.3  24.4   HCT 39.0 - 52.0 % 44.1  39.9  41.6   Platelets 150 - 400 K/uL 280  295  294   NEUT# 1.4 - 7.0 x10E3/uL   3.7   Lymph# 0.7 - 3.1 x10E3/uL   3.6      There is no height or weight on file to calculate BMI.  Orders:  Orders Placed This Encounter  Procedures   XR Foot 2 Views Left   No orders of the defined types were placed in this encounter.    Procedures: No procedures performed  Clinical Data: No additional findings.  ROS:  All other systems negative, except as noted in the HPI. Review of Systems  Objective: Vital Signs: BP (!) 126/92 (BP Location: Left Arm, Patient Position: Sitting, Cuff Size: Large)   Pulse (!) 140   SpO2 92%   Specialty Comments:  No specialty comments available.  PMFS History: Patient Active Problem List   Diagnosis Date Noted   Stress  fracture of metatarsal bone of left foot 05/24/2023   Bilateral leg pain 07/03/2022   Hyperkalemia 01/07/2022   Pain of joint of left ankle and foot 04/01/2018   MDD (major depressive disorder) 11/13/2015   GERD (gastroesophageal reflux disease) 08/20/2015   Chronic nausea 03/29/2015   Preventative health care 08/23/2014   Diabetic neuropathy (HCC) 08/01/2013   Hypertension associated with diabetes (HCC) 07/31/2013   Hyperlipidemia 07/31/2013   Type 2 diabetes mellitus with peripheral neuropathy (HCC) 07/31/1992   Past Medical History:  Diagnosis Date   Depression    Diabetes mellitus without complication (HCC)    Hyperlipidemia    Hypertension    Ischemic colitis (HCC)     Family History  Problem Relation Age of Onset   Hypertension Mother    Stroke Father    Diabetes Maternal Grandfather    Colon cancer Neg Hx    Throat cancer Neg Hx    Prostate cancer Neg Hx    Pancreatic cancer Neg Hx    Heart disease Neg Hx    Kidney disease Neg Hx    Liver disease Neg Hx     Past Surgical History:  Procedure Laterality Date   COLONOSCOPY  06/01/2014   NASAL SEPTUM SURGERY     Social History   Occupational History   Occupation: Midwife  Tobacco Use   Smoking status: Never   Smokeless tobacco: Never  Substance and Sexual Activity   Alcohol use: No   Drug use: No   Sexual activity: Not on file

## 2023-09-20 ENCOUNTER — Telehealth: Payer: Self-pay

## 2023-09-20 NOTE — Telephone Encounter (Signed)
I called and lm on vm for pt to advise of information below. I also emailed the case manager to let her know that I have reached out to the pt and what he was advised to do. Can call with any questions.

## 2023-09-20 NOTE — Telephone Encounter (Signed)
Email received from nurse case manager  He did go to ER and was discharged with instructions to follow up with cardiology and that is when his appointment is. I also let him know to return to ER if it remains elevated.

## 2023-09-20 NOTE — Telephone Encounter (Signed)
Received this email from the pt's case manager. Please see below and advise. This pt was needing cards clearance for surgery but was also experiencing elevated heart rate at appt and thought it was possible Afib.  Client reports he has a follow up with his primary care doctor and also with cardiology. The soonest he can get in with cardiology is on 10/29. He is on a waiting list for a sooner visit if one opens up and he will keep me posted.

## 2023-10-06 ENCOUNTER — Ambulatory Visit: Payer: BC Managed Care – PPO | Admitting: Student

## 2023-10-06 ENCOUNTER — Encounter: Payer: Self-pay | Admitting: Student

## 2023-10-06 ENCOUNTER — Encounter: Payer: Self-pay | Admitting: Endocrinology

## 2023-10-06 VITALS — BP 132/101 | HR 115 | Temp 98.2°F | Ht 72.0 in | Wt 315.6 lb

## 2023-10-06 DIAGNOSIS — E1142 Type 2 diabetes mellitus with diabetic polyneuropathy: Secondary | ICD-10-CM

## 2023-10-06 DIAGNOSIS — E1159 Type 2 diabetes mellitus with other circulatory complications: Secondary | ICD-10-CM

## 2023-10-06 DIAGNOSIS — Z794 Long term (current) use of insulin: Secondary | ICD-10-CM

## 2023-10-06 DIAGNOSIS — I152 Hypertension secondary to endocrine disorders: Secondary | ICD-10-CM | POA: Diagnosis not present

## 2023-10-06 DIAGNOSIS — N179 Acute kidney failure, unspecified: Secondary | ICD-10-CM | POA: Diagnosis not present

## 2023-10-06 DIAGNOSIS — Z7984 Long term (current) use of oral hypoglycemic drugs: Secondary | ICD-10-CM

## 2023-10-06 LAB — POCT GLYCOSYLATED HEMOGLOBIN (HGB A1C): Hemoglobin A1C: 7.9 % — AB (ref 4.0–5.6)

## 2023-10-06 LAB — GLUCOSE, CAPILLARY: Glucose-Capillary: 172 mg/dL — ABNORMAL HIGH (ref 70–99)

## 2023-10-06 NOTE — Patient Instructions (Signed)
  Thank you, [name], for allowing [place] to provide your care today.  I am sorry that you are having to go through all of this and that you are still in pain from your foot fracture.  I would like you to take Tylenol 1000 mg every 8 hours to help with the pain.  You can also use topical gels like Voltaren, Biofreeze, or lidocaine patches over your foot.  We are going to recheck your kidney function today and I will call you with the results.  I would like you to work on staying hydrated and drink 2-3 L of water a day.  Please continue with your scheduled follow-up with a cardiologist and with your endocrinologist.  I recommend that you or your social worker reach out to Dr. Audrie Lia office and let him know what has happened so far and that you would like a follow-up appointment to discuss the surgery.  We will be available if you need anything from [place] but we will plan to see you for regular follow-up in about 3 months which hopefully will be after you have had your surgery.  I have ordered the following labs for you:   Lab Orders         Glucose, capillary         BMP8+Anion Gap         POC Hbg A1C       Follow up: 3-4 months    Remember:     Should you have any questions or concerns please call the internal medicine clinic at [phone].     Rocky Morel, DO [facility]

## 2023-10-06 NOTE — Progress Notes (Signed)
CC: Hospital Follow Up   HPI:  Jack Wallace is a 62 y.o. male with pertinent PMH of HTN, T2DM with neuropathy, MDD who presents to the clinic for an ED follow-up after being seen for tachycardia and hypoxia on 09/16/2023. Please see assessment and plan below for further details.  Past Medical History:  Diagnosis Date   Depression    Diabetes mellitus without complication (HCC)    Hyperlipidemia    Hypertension    Ischemic colitis (HCC)    Tachycardia     Current Outpatient Medications  Medication Instructions   Alpha Lipoic Acid 200 mg, Oral, 2 pills daily    amLODipine (NORVASC) 5 MG tablet TAKE 1 TABLET(5 MG) BY MOUTH DAILY   aspirin 81 mg, Daily   BAYER MICROLET LANCETS lancets Use to check blood sugar two times per day   Blood Glucose Monitoring Suppl (CONTOUR NEXT EZ MONITOR) w/Device KIT Use to check blood sugar two times per day   Cholecalciferol (VITAMIN D PO) 1 tablet, Daily   CONTOUR NEXT TEST test strip TEST TWICE DAILY   diclofenac Sodium (VOLTAREN) 2 g, Topical, 4 times daily   empagliflozin (JARDIANCE) 25 mg, Oral, Daily   ferrous sulfate 325 mg, Oral, Every other day, On Monday, Wednesday, and Friday   gabapentin (NEURONTIN) 600 MG tablet TAKE 1 TABLET(600 MG) BY MOUTH THREE TIMES DAILY   glimepiride (AMARYL) 1 MG tablet Take 1 tablet before lunch and bedtime   Insulin Pen Needle (B-D UF III MINI PEN NEEDLES) 31G X 5 MM MISC USE DAILY WITH INSULIN PENS   Insulin Pen Needle 32G X 5 MM MISC 1 Needle, Does not apply, Daily, Use Daily with Insulin Pen   losartan (COZAAR) 50 MG tablet TAKE 1 TABLET(50 MG) BY MOUTH DAILY   Magnesium Oxide 400 mg, Oral, Daily   metFORMIN (GLUCOPHAGE-XR) 500 MG 24 hr tablet TAKE  TABLETS BY MOUTH EVERY DAY WITH SUPPER   Multiple Vitamin (MULTIVITAMIN WITH MINERALS) TABS tablet 1 tablet, Oral, Daily   pantoprazole (PROTONIX) 40 MG tablet TAKE 1 TABLET(40 MG) BY MOUTH TWICE DAILY   polyethylene glycol (MIRALAX) 17 g, Oral, Daily    rosuvastatin (CRESTOR) 10 MG tablet TAKE 1 TABLET(10 MG) BY MOUTH DAILY   sertraline (ZOLOFT) 200 mg, Oral, Daily   Tresiba FlexTouch 22 Units, Subcutaneous, Daily   vitamin B-12 (CYANOCOBALAMIN) 500 mcg, Oral, Daily   vitamin C 1,000 mg, Oral, Daily     Review of Systems:   Pertinent items noted in HPI and/or A&P.  Physical Exam:  Vitals:   10/06/23 0901 10/06/23 0941  BP: (!) 149/107 (!) 132/101  Pulse: (!) 117 (!) 115  Temp: 98.2 F (36.8 C)   TempSrc: Oral   SpO2: 96%   Weight: (!) 315 lb 9.6 oz (143.2 kg)   Height: 6' (1.829 m)     Constitutional: Uncomfortable well-appearing middle-age male. In no acute distress. Cardio: Tachycardic with regular rhythm. 2+ bilateral radial pulses. Pulm:Clear to auscultation bilaterally. Normal work of breathing on room air. Abdomen: Soft, non-tender, non-distended, positive bowel sounds. NFA:OZHYQMVH for extremity edema. Skin:Warm and dry. Neuro:Alert and oriented x3. No focal deficit noted. Psych:Pleasant mood and affect.   Assessment & Plan:   Hypertension associated with diabetes (HCC) Blood pressure today elevated at 149/107 and repeat 132/101.  This is in the setting of pain due to foot fracture which he is currently being scheduled for surgery that was delayed by tachycardic event.  We will continue on his current regimen. -  Continue amlodipine 5 mg daily and losartan 50 mg daily  AKI (acute kidney injury) (HCC) Patient went to the ED from orthopedic office due to tachycardia and measured hypoxia.  Heart rate was in the 140s.  This responded to IV fluids and basic lab work including troponins and chest x-ray/EKG showed no significant abnormalities other than mild AKI with creatinine bump from 1.12 to 1.32.  He does not drink much water at home and this was likely in the setting of dehydration which also caused his tachycardia but he also has a high resting heart rate and is currently in pain due to his broken foot.  He is not  having any orthopnea but is having some dyspnea when walking in his boot.  This is more from his walking style and the pain and not consistent with heart failure. - Repeat BMP today and recommend continued good hydration    Patient discussed with Dr. Quinn Plowman, DO [facility] Internal Medicine Resident PGY-2 Clinic Phone: [phone] Pager: [phone]

## 2023-10-07 LAB — BMP8+ANION GAP
Anion Gap: 19 mmol/L — ABNORMAL HIGH (ref 10.0–18.0)
BUN/Creatinine Ratio: 15 (ref 10–24)
BUN: 17 mg/dL (ref 8–27)
CO2: 21 mmol/L (ref 20–29)
Calcium: 10.4 mg/dL — ABNORMAL HIGH (ref 8.6–10.2)
Chloride: 100 mmol/L (ref 96–106)
Creatinine, Ser: 1.13 mg/dL (ref 0.76–1.27)
Glucose: 175 mg/dL — ABNORMAL HIGH (ref 70–99)
Potassium: 4.2 mmol/L (ref 3.5–5.2)
Sodium: 140 mmol/L (ref 134–144)
eGFR: 73 mL/min/{1.73_m2} (ref >59)

## 2023-10-08 ENCOUNTER — Other Ambulatory Visit: Payer: Self-pay

## 2023-10-08 DIAGNOSIS — E1142 Type 2 diabetes mellitus with diabetic polyneuropathy: Secondary | ICD-10-CM

## 2023-10-08 MED ORDER — GABAPENTIN 600 MG PO TABS: ORAL_TABLET | ORAL | 1 refills | Status: DC

## 2023-10-08 NOTE — Assessment & Plan Note (Signed)
Blood pressure today elevated at 149/107 and repeat 132/101.  This is in the setting of pain due to foot fracture which he is currently being scheduled for surgery that was delayed by tachycardic event.  We will continue on his current regimen. - Continue amlodipine 5 mg daily and losartan 50 mg daily

## 2023-10-08 NOTE — Assessment & Plan Note (Signed)
Patient went to the ED from orthopedic office due to tachycardia and measured hypoxia.  Heart rate was in the 140s.  This responded to IV fluids and basic lab work including troponins and chest x-ray/EKG showed no significant abnormalities other than mild AKI with creatinine bump from 1.12 to 1.32.  He does not drink much water at home and this was likely in the setting of dehydration which also caused his tachycardia but he also has a high resting heart rate and is currently in pain due to his broken foot.  He is not having any orthopnea but is having some dyspnea when walking in his boot.  This is more from his walking style and the pain and not consistent with heart failure. - Repeat BMP today and recommend continued good hydration

## 2023-10-12 NOTE — Progress Notes (Signed)
Internal Medicine Clinic Attending  Case discussed with the resident at the time of the visit.  We reviewed the resident's history and exam and pertinent patient test results.  I agree with the assessment, diagnosis, and plan of care documented in the resident's note.  

## 2023-10-15 ENCOUNTER — Other Ambulatory Visit (INDEPENDENT_AMBULATORY_CARE_PROVIDER_SITE_OTHER): Payer: BC Managed Care – PPO

## 2023-10-15 DIAGNOSIS — E1165 Type 2 diabetes mellitus with hyperglycemia: Secondary | ICD-10-CM | POA: Diagnosis not present

## 2023-10-15 DIAGNOSIS — Z794 Long term (current) use of insulin: Secondary | ICD-10-CM | POA: Diagnosis not present

## 2023-10-15 DIAGNOSIS — E782 Mixed hyperlipidemia: Secondary | ICD-10-CM | POA: Diagnosis not present

## 2023-10-15 LAB — COMPREHENSIVE METABOLIC PANEL
ALT: 14 U/L (ref 0–53)
AST: 16 U/L (ref 0–37)
Albumin: 4.6 g/dL (ref 3.5–5.2)
Alkaline Phosphatase: 68 U/L (ref 39–117)
BUN: 20 mg/dL (ref 6–23)
CO2: 26 meq/L (ref 19–32)
Calcium: 9.9 mg/dL (ref 8.4–10.5)
Chloride: 99 meq/L (ref 96–112)
Creatinine, Ser: 1.15 mg/dL (ref 0.40–1.50)
GFR: 67.93 mL/min (ref >60.00)
Glucose, Bld: 222 mg/dL — ABNORMAL HIGH (ref 70–99)
Potassium: 4 meq/L (ref 3.5–5.1)
Sodium: 138 meq/L (ref 135–145)
Total Bilirubin: 0.7 mg/dL (ref 0.2–1.2)
Total Protein: 7.8 g/dL (ref 6.0–8.3)

## 2023-10-15 LAB — LIPID PANEL
Cholesterol: 169 mg/dL (ref 0–200)
HDL: 49.8 mg/dL (ref >39.00)
LDL Cholesterol: 86 mg/dL (ref 0–99)
NonHDL: 119.16
Total CHOL/HDL Ratio: 3
Triglycerides: 164 mg/dL — ABNORMAL HIGH (ref 0.0–149.0)
VLDL: 32.8 mg/dL (ref 0.0–40.0)

## 2023-10-15 LAB — HEMOGLOBIN A1C: Hgb A1c MFr Bld: 8.3 % — ABNORMAL HIGH (ref 4.6–6.5)

## 2023-10-18 ENCOUNTER — Telehealth: Payer: Self-pay

## 2023-10-18 DIAGNOSIS — R000 Tachycardia, unspecified: Secondary | ICD-10-CM | POA: Insufficient documentation

## 2023-10-18 NOTE — Progress Notes (Unsigned)
Cardiology Office Note:  .   Date:  10/18/2023  ID:  Jack Wallace, DOB 1961-10-28, MRN [id number] PCP: Kathleen Lime, MD  [facility] Providers Cardiologist:  Truett Mainland, MD PCP: Kathleen Lime, MD  No chief complaint on file.     History of Present Illness: .    Jack Wallace is a 62 y.o. male with ***  There were no vitals filed for this visit.   ROS: *** ROS   Studies Reviewed: Marland Kitchen       Labs 10/15/2023: Glucose 222 TG 164, LDL 88  *** Risk Assessment/Calculations:   {Does this patient have ATRIAL FIBRILLATION?:[id number]}   Physical Exam:   Physical Exam   VISIT DIAGNOSES: No diagnosis found.   ASSESSMENT AND PLAN: .    Jack Wallace is a 62 y.o. male with ***  {Are you ordering a CV Procedure (e.g. stress test, cath, DCCV, TEE, etc)?   Press F2        :[id number]}    No orders of the defined types were placed in this encounter.    F/u in ***  Signed, Elder Negus, MD

## 2023-10-18 NOTE — Telephone Encounter (Signed)
Pt is requesting a call back .Marland Kitchen He stated that Dr Geraldo Pitter  sent him a mychart message  requesting him to come in for more blood work ... But I dont see a message in the chart or any future lab orders

## 2023-10-18 NOTE — Telephone Encounter (Signed)
Lab appt schedule for tomorrow 10/29 @ 1100AM. Lab order has been placed per Dr Geraldo Pitter.

## 2023-10-18 NOTE — Telephone Encounter (Signed)
Note in lab results per Dr Geraldo Pitter. When do you want pt to come in?

## 2023-10-18 NOTE — Progress Notes (Signed)
Unable to reach the patient by phone last week or this week. BMP shows improved renal function to baseline. Anion gap is elevated with bicarb of 21 which is stable from prior but down from baseline. Calcium is also increased to 10.4. I would recommend repeating this lab in the next week to two weeks and will send a message to the patient to call our clinic to schedule a lab only appointment for BMP sent stat to [facility] to decrease risk of falsely low bicarb.

## 2023-10-18 NOTE — Addendum Note (Signed)
Addended by: Bufford Spikes on: 10/18/2023 04:32 PM   Modules accepted: Orders

## 2023-10-19 ENCOUNTER — Other Ambulatory Visit (INDEPENDENT_AMBULATORY_CARE_PROVIDER_SITE_OTHER): Payer: BC Managed Care – PPO

## 2023-10-19 ENCOUNTER — Encounter: Payer: Self-pay | Admitting: Cardiology

## 2023-10-19 ENCOUNTER — Ambulatory Visit (HOSPITAL_COMMUNITY)
Admission: RE | Admit: 2023-10-19 | Discharge: 2023-10-19 | Disposition: A | Discharge status: Home / Self Care | Payer: BC Managed Care – PPO | Source: Ambulatory Visit | Attending: Cardiology | Admitting: Cardiology

## 2023-10-19 ENCOUNTER — Other Ambulatory Visit: Payer: Self-pay | Admitting: Endocrinology

## 2023-10-19 ENCOUNTER — Ambulatory Visit: Payer: BC Managed Care – PPO | Attending: Cardiology | Admitting: Cardiology

## 2023-10-19 VITALS — BP 140/94 | HR 114 | Resp 12 | Ht 72.0 in | Wt 317.0 lb

## 2023-10-19 DIAGNOSIS — R000 Tachycardia, unspecified: Secondary | ICD-10-CM

## 2023-10-19 DIAGNOSIS — R0609 Other forms of dyspnea: Secondary | ICD-10-CM | POA: Insufficient documentation

## 2023-10-19 DIAGNOSIS — E7800 Pure hypercholesterolemia, unspecified: Secondary | ICD-10-CM

## 2023-10-19 LAB — BASIC METABOLIC PANEL
Anion gap: 13 (ref 5–15)
BUN: 18 mg/dL (ref 8–23)
CO2: 23 mmol/L (ref 22–32)
Calcium: 10 mg/dL (ref 8.9–10.3)
Chloride: 104 mmol/L (ref 98–111)
Creatinine, Ser: 1.11 mg/dL (ref 0.61–1.24)
GFR, Estimated: >60 mL/min (ref >60)
Glucose, Bld: 203 mg/dL — ABNORMAL HIGH (ref 70–99)
Potassium: 4.1 mmol/L (ref 3.5–5.1)
Sodium: 140 mmol/L (ref 135–145)

## 2023-10-19 MED ORDER — IOHEXOL 350 MG/ML SOLN
75.0000 mL | Freq: Once | INTRAVENOUS | Status: AC | PRN
  Administered 2023-10-19: 75 mL via INTRAVENOUS

## 2023-10-19 NOTE — Telephone Encounter (Signed)
Crestor refill request complete

## 2023-10-19 NOTE — Patient Instructions (Signed)
Medication Instructions:   Your physician recommends that you continue on your current medications as directed. Please refer to the Current Medication list given to you today.  *If you need a refill on your cardiac medications before your next appointment, please call your pharmacy*   Lab Work:  TODAY--STAT PRO-BNP  If you have labs (blood work) drawn today and your tests are completely normal, you will receive your results only by: MyChart Message (if you have MyChart) OR A paper copy in the mail If you have any lab test that is abnormal or we need to change your treatment, we will call you to review the results.   Testing/Procedures:  Your physician has requested that you have an STAT echocardiogram. Echocardiography is a painless test that uses sound waves to create images of your heart. It provides your doctor with information about the size and shape of your heart and how well your heart's chambers and valves are working. This procedure takes approximately one hour. There are no restrictions for this procedure.  THIS WAS ORDERED STAT  Please do NOT wear cologne, perfume, aftershave, or lotions (deodorant is allowed). Please arrive 15 minutes prior to your appointment time.   CT ANGIO CHEST PE W/WO CONTRAST--STAT ORDER    Follow-Up:  6 WEEKS WITH AN EXTENDER

## 2023-10-20 ENCOUNTER — Ambulatory Visit (HOSPITAL_COMMUNITY)
Admission: RE | Admit: 2023-10-20 | Discharge: 2023-10-20 | Disposition: A | Discharge status: Home / Self Care | Payer: BC Managed Care – PPO | Source: Ambulatory Visit | Attending: Cardiology | Admitting: Cardiology

## 2023-10-20 ENCOUNTER — Telehealth: Payer: Self-pay

## 2023-10-20 DIAGNOSIS — R0609 Other forms of dyspnea: Secondary | ICD-10-CM | POA: Insufficient documentation

## 2023-10-20 DIAGNOSIS — E669 Obesity, unspecified: Secondary | ICD-10-CM | POA: Diagnosis not present

## 2023-10-20 DIAGNOSIS — E119 Type 2 diabetes mellitus without complications: Secondary | ICD-10-CM | POA: Diagnosis not present

## 2023-10-20 DIAGNOSIS — I081 Rheumatic disorders of both mitral and tricuspid valves: Secondary | ICD-10-CM | POA: Insufficient documentation

## 2023-10-20 DIAGNOSIS — R000 Tachycardia, unspecified: Secondary | ICD-10-CM | POA: Diagnosis not present

## 2023-10-20 DIAGNOSIS — I10 Essential (primary) hypertension: Secondary | ICD-10-CM | POA: Insufficient documentation

## 2023-10-20 LAB — ECHOCARDIOGRAM COMPLETE
AR max vel: 3.28 cm2
AV Area VTI: 3.36 cm2
AV Area mean vel: 3.22 cm2
AV Mean grad: 3 mm[Hg]
AV Peak grad: 4.9 mm[Hg]
Ao pk vel: 1.11 m/s
Area-P 1/2: 3.67 cm2
S' Lateral: 2.8 cm

## 2023-10-20 LAB — PRO B NATRIURETIC PEPTIDE: NT-Pro BNP: 49 pg/mL (ref 0–210)

## 2023-10-20 NOTE — Telephone Encounter (Signed)
Transition Care Management Unsuccessful Follow-up Telephone Call  Date of discharge and from where:  09/16/2023 [facility]  Attempts:  1st Attempt  Reason for unsuccessful TCM follow-up call:  Unable to reach patient. Patient not available.  Andrzej Scully [facility]  [facility], [facility] Guide Direct Dial: [phone]  Website: [email or web address]

## 2023-10-20 NOTE — Progress Notes (Signed)
Echocardiogram 2D Echocardiogram has been performed.  Jack Wallace 10/20/2023, 8:41 AM

## 2023-10-20 NOTE — Progress Notes (Signed)
Heart function is normal as well. I do not see any contraindication to proceed with surgery. Monitor will tell us about average heart rate.  Thanks MJP

## 2023-10-21 ENCOUNTER — Telehealth: Payer: Self-pay

## 2023-10-21 ENCOUNTER — Telehealth: Payer: Self-pay | Admitting: Cardiology

## 2023-10-21 NOTE — Telephone Encounter (Signed)
Transition Care Management Follow-up Telephone Call Date of discharge and from where: 09/16/2023 [facility] How have you been since you were released from the hospital? Patient stated he is feeling better. Any questions or concerns? No  Items Reviewed: Did the pt receive and understand the discharge instructions provided? Yes  Medications obtained and verified?  No medication prescribed. Other? No  Any new allergies since your discharge? No  Dietary orders reviewed? Yes Do you have support at home? Yes   Follow up appointments reviewed:  PCP Hospital f/u appt confirmed? Yes  Scheduled to see Rocky Morel, DO  on 10/06/2023 @ [facility]. Specialist Hospital f/u appt confirmed? Yes  Scheduled to see Elder Negus, MD on 10/19/2023 @ [facility]. Are transportation arrangements needed? No  If their condition worsens, is the pt aware to call PCP or go to the Emergency Dept.? Yes Was the patient provided with contact information for the PCP's office or ED? Yes Was to pt encouraged to call back with questions or concerns? Yes   Judythe Postema [facility]  [facility], [facility] Guide Direct Dial: [phone]  Website: [email or web address]

## 2023-10-21 NOTE — Telephone Encounter (Signed)
I contacted patient this morning to f/u on his visit with [place]. Patient had mentioned he was in a workers comp claim but didn't specify what type of injury he had. Patient is having testing done to be cleared for a procedure and we need to confirm that the procedure he is being cleared for is not part of workers comp. And if so we would need to file charges with different guarantor account.

## 2023-10-21 NOTE — Telephone Encounter (Signed)
Patient states he is returning an additional call regarding his workers comp.

## 2023-10-28 NOTE — Progress Notes (Signed)
Pt called and updated on normal calcium and anion gap with repeat BMP.

## 2023-10-29 ENCOUNTER — Encounter: Payer: Self-pay | Admitting: Endocrinology

## 2023-10-29 ENCOUNTER — Ambulatory Visit: Payer: BC Managed Care – PPO | Admitting: Endocrinology

## 2023-10-29 DIAGNOSIS — I10 Essential (primary) hypertension: Secondary | ICD-10-CM

## 2023-10-29 DIAGNOSIS — E1165 Type 2 diabetes mellitus with hyperglycemia: Secondary | ICD-10-CM | POA: Diagnosis not present

## 2023-10-29 DIAGNOSIS — Z794 Long term (current) use of insulin: Secondary | ICD-10-CM | POA: Diagnosis not present

## 2023-10-29 DIAGNOSIS — E1142 Type 2 diabetes mellitus with diabetic polyneuropathy: Secondary | ICD-10-CM

## 2023-10-29 MED ORDER — TRESIBA FLEXTOUCH 100 UNIT/ML ~~LOC~~ SOPN
30.0000 [IU] | PEN_INJECTOR | Freq: Every day | SUBCUTANEOUS | 3 refills | Status: DC
Start: 2023-10-29 — End: 2024-04-17

## 2023-10-29 MED ORDER — GLIMEPIRIDE 1 MG PO TABS
ORAL_TABLET | ORAL | 3 refills | Status: DC
Start: 2023-10-29 — End: 2024-07-05

## 2023-10-29 MED ORDER — EMPAGLIFLOZIN 25 MG PO TABS: 25.0000 mg | ORAL_TABLET | Freq: Every day | ORAL | 3 refills | Status: DC

## 2023-10-29 MED ORDER — CONTOUR NEXT TEST VI STRP
ORAL_STRIP | 2 refills | Status: DC
Start: 2023-10-29 — End: 2024-02-02

## 2023-10-29 MED ORDER — INSULIN PEN NEEDLE 32G X 5 MM MISC
1.0000 | Freq: Every day | 1 refills | Status: DC
Start: 2023-10-29 — End: 2024-02-02

## 2023-10-29 NOTE — Patient Instructions (Addendum)
Diabetes regimen: Tresiba 30 units daily at bedtime Metformin extended release 1500 mg daily. Jardiance 25 mg daily.   Amaryl 2 mg daily.

## 2023-10-29 NOTE — Progress Notes (Signed)
Outpatient Endocrinology Note Jack Mariem Skolnick, MD  10/29/23  Patient's Name: Jack Wallace    DOB: [birth year]    MRN: [id number]                                                    REASON OF VISIT: Follow up of type 2 diabetes mellitus  PCP: Kathleen Lime, MD  HISTORY OF PRESENT ILLNESS:   Jack Wallace is a 62 y.o. old male with past medical history listed below, is here for follow up for type 2 diabetes mellitus.  Patient was last seen by Dr. Lucianne Muss in July 2024.  Pertinent Diabetes History: Patient was diagnosed with type 2 diabetes mellitus in 1993.  Chronic Diabetes Complications : Retinopathy: no. Last ophthalmology exam was done on 04/2023, following with ophthalmology regularly.  Nephropathy: no, on ACE/ARB / losartan Peripheral neuropathy: yes, on gabapentin Coronary artery disease: no Stroke: no  Relevant comorbidities and cardiovascular risk factors: Obesity: yes Body mass index is 43.21 kg/m.  Hypertension: Yes  Hyperlipidemia : Yes, on statin   Current / Home Diabetic regimen includes: Tresiba 24 units daily at bedtime Metformin extended release 1500 mg daily. Jardiance 25 mg daily.   Amaryl 1 mg daily.  Prior diabetic medications: Actos had used in the past stopped due to fear of long-term side effects.  He had used glipizide, Ozempic and Victoza in the past.  Glycemic data:   Contour next glucometer download reviewed for last 2 weeks October 25 to October 29, 2023.  Highest blood sugar 287, lowest blood sugar 114.  Most blood sugar sugar in the 200 range 200, 241, 205, 234, 172.  He has been mostly taking blood sugar once a day.  CGM discussed in the past was not cost effective.  Hypoglycemia: Patient has no hypoglycemic episodes. Patient has hypoglycemia awareness.  Factors modifying glucose control: 1.  Diabetic diet assessment: 3 meals a day.  2.  Staying active or exercising: Currently no exercise, due to left foot fracture.  3.  Medication  compliance: compliant all of the time.  Interval history  Patient had a fall and had a closed fracture of shaft of metatarsal bone of left foot, currently on orthopedic boot and waiting for surgery.  Due to limited physical activity he has worsening hyperglycemia, recent hemoglobin A1c 8.3% and blood sugar mostly in 200 range.  He has no other complaints today.  REVIEW OF SYSTEMS As per history of present illness.   PAST MEDICAL HISTORY: Past Medical History:  Diagnosis Date   Depression    Diabetes mellitus without complication (HCC)    Hyperlipidemia    Hypertension    Ischemic colitis (HCC)    Tachycardia     PAST SURGICAL HISTORY: Past Surgical History:  Procedure Laterality Date   COLONOSCOPY  06/01/2014   NASAL SEPTUM SURGERY      ALLERGIES: Allergies  Allergen Reactions   Mounjaro [Tirzepatide] Diarrhea and Nausea Only    FAMILY HISTORY:  Family History  Problem Relation Age of Onset   Hypertension Mother    Stroke Father    Diabetes Maternal Grandfather    Colon cancer Neg Hx    Throat cancer Neg Hx    Prostate cancer Neg Hx    Pancreatic cancer Neg Hx    Heart disease Neg Hx    Kidney disease  Neg Hx    Liver disease Neg Hx     SOCIAL HISTORY: Social History   Socioeconomic History   Marital status: Single    Spouse name: Not on file   Number of children: 0   Years of education: Not on file   Highest education level: Not on file  Occupational History   Occupation: Midwife  Tobacco Use   Smoking status: Never   Smokeless tobacco: Never  Vaping Use   Vaping status: Never Used  Substance and Sexual Activity   Alcohol use: No   Drug use: No   Sexual activity: Not on file  Other Topics Concern   Not on file  Social History Narrative   Not on file   Social Determinants of Health   Financial Resource Strain: Not on file  Food Insecurity: Not on file  Transportation Needs: No Transportation Needs (08/27/2022)   PRAPARE - Therapist, art (Medical): No    Lack of Transportation (Non-Medical): No  Physical Activity: Not on file  Stress: Not on file  Social Connections: Not on file    MEDICATIONS:  Current Outpatient Medications  Medication Sig Dispense Refill   Alpha Lipoic Acid 200 MG CAPS Take 200 mg by mouth. 2 pills daily     amLODipine (NORVASC) 5 MG tablet TAKE 1 TABLET(5 MG) BY MOUTH DAILY 30 tablet 11   Ascorbic Acid (VITAMIN C) 1000 MG tablet Take 1,000 mg by mouth daily.     aspirin 81 MG tablet Take 81 mg by mouth daily.     BAYER MICROLET LANCETS lancets Use to check blood sugar two times per day 100 each 2   Blood Glucose Monitoring Suppl (CONTOUR NEXT EZ MONITOR) w/Device KIT Use to check blood sugar two times per day 1 kit 2   Cholecalciferol (VITAMIN D PO) Take 1 tablet by mouth daily. D3 1000 IU     diclofenac Sodium (VOLTAREN) 1 % GEL Apply 2 g topically 4 (four) times daily. 100 g 2   gabapentin (NEURONTIN) 600 MG tablet TAKE 1 TABLET(600 MG) BY MOUTH THREE TIMES DAILY 270 tablet 1   losartan (COZAAR) 50 MG tablet TAKE 1 TABLET(50 MG) BY MOUTH DAILY 30 tablet 11   Magnesium Oxide 400 MG CAPS Take 1 capsule (400 mg total) by mouth daily. 30 capsule 0   metFORMIN (GLUCOPHAGE-XR) 500 MG 24 hr tablet TAKE  TABLETS BY MOUTH EVERY DAY WITH SUPPER 270 tablet 2   Multiple Vitamin (MULTIVITAMIN WITH MINERALS) TABS tablet Take 1 tablet by mouth daily.     pantoprazole (PROTONIX) 40 MG tablet TAKE 1 TABLET(40 MG) BY MOUTH TWICE DAILY 30 tablet 11   polyethylene glycol (MIRALAX) 17 g packet Take 17 g by mouth daily. 14 each 0   rosuvastatin (CRESTOR) 10 MG tablet TAKE 1 TABLET(10 MG) BY MOUTH DAILY 30 tablet 3   sertraline (ZOLOFT) 100 MG tablet Take 2 tablets (200 mg total) by mouth daily. 60 tablet 11   vitamin B-12 (CYANOCOBALAMIN) 500 MCG tablet Take 500 mcg by mouth daily.     empagliflozin (JARDIANCE) 25 MG TABS tablet Take 1 tablet (25 mg total) by mouth daily. 90 tablet 3   ferrous  sulfate 325 (65 FE) MG tablet Take 1 tablet (325 mg total) by mouth every other day. On Monday, Wednesday, and Friday (Patient not taking: Reported on 07/15/2023) 15 tablet 11   glimepiride (AMARYL) 1 MG tablet Take 2 tablet daily. 180 tablet 3   glucose  blood (CONTOUR NEXT TEST) test strip TEST TWICE DAILY 200 strip 2   insulin degludec (TRESIBA FLEXTOUCH) 100 UNIT/ML FlexTouch Pen Inject 30 Units into the skin daily. 30 mL 3   Insulin Pen Needle 32G X 5 MM MISC 1 Needle by Does not apply route daily. Use Daily with Insulin Pen 100 each 1   No current facility-administered medications for this visit.    PHYSICAL EXAM: Vitals:   10/29/23 0806  BP: (!) 138/98  Pulse: (!) 119  Resp: 20  SpO2: 90%  Weight: (!) 318 lb 9.6 oz (144.5 kg)  Height: 6' (1.829 m)   Body mass index is 43.21 kg/m.  Wt Readings from Last 3 Encounters:  10/29/23 (!) 318 lb 9.6 oz (144.5 kg)  10/19/23 (!) 317 lb (143.8 kg)  10/06/23 (!) 315 lb 9.6 oz (143.2 kg)    General: Well developed, well nourished male in no apparent distress.  HEENT: AT/[place], no external lesions.  Eyes: Conjunctiva clear and no icterus. Neck: Neck supple  Lungs: Respirations not labored Neurologic: Alert, oriented, normal speech Extremities / Skin: Dry.  Left foot with orthopedic boot. Psychiatric: Does not appear depressed or anxious  Diabetic Foot Exam - Simple   No data filed   LABS Reviewed Lab Results  Component Value Date   HGBA1C 8.3 (H) 10/15/2023   HGBA1C 7.9 (A) 10/06/2023   HGBA1C 7.6 (H) 07/05/2023   Lab Results  Component Value Date   FRUCTOSAMINE 293 (H) 11/16/2022   FRUCTOSAMINE 315 (H) 03/17/2022   FRUCTOSAMINE 294 (H) 02/24/2018   Lab Results  Component Value Date   CHOL 169 10/15/2023   HDL 49.80 10/15/2023   LDLCALC 86 10/15/2023   LDLDIRECT 85.0 07/30/2022   TRIG 164.0 (H) 10/15/2023   CHOLHDL 3 10/15/2023   Lab Results  Component Value Date   MICRALBCREAT 0.8 01/22/2022   MICRALBCREAT 0.7  07/22/2021   Lab Results  Component Value Date   CREATININE 1.11 10/19/2023   Lab Results  Component Value Date   GFR 67.93 10/15/2023    ASSESSMENT / PLAN  1. Uncontrolled type 2 diabetes mellitus with hyperglycemia, with long-term current use of insulin (HCC)   2. Type 2 diabetes mellitus with hyperglycemia, with long-term current use of insulin (HCC)   3. Diabetic peripheral neuropathy associated with type 2 diabetes mellitus (HCC)   4. Essential hypertension     Diabetes Mellitus type 2, complicated by diabetic neuropathy. - Diabetic status / severity: Uncontrolled, worsening.  Lab Results  Component Value Date   HGBA1C 8.3 (H) 10/15/2023    - Hemoglobin A1c goal : <7%  Patient has worsening hyperglycemia.  Contributed by limited physical activity due to left foot fracture.  Adjusted diabetes regimen as follows.  Discussed that he need to have better blood sugar control for anticipated foot surgery and healing process.  - Medications: See below.  I) increase Tresiba from 24 to 30 units daily at bedtime. II) continue metformin extended release 1500 mg daily. III) continue Jardiance 25 mg daily. IV) increase Amaryl from 1 mg to 2 mg daily.  Patient is asked to call our clinic if his blood sugar remains more than 150 persistently will adjust the diabetes regimen in between the visit over the phone.  - Home glucose testing: In the morning daily fasting and at bedtime. - Discussed/ Gave Hypoglycemia treatment plan.  # Consult : not required at this time.   # Annual urine for microalbuminuria/ creatinine ratio, no microalbuminuria currently, continue ACE/ARB losartan.  Last  Lab Results  Component Value Date   MICRALBCREAT 0.8 01/22/2022    # Foot check nightly / neuropathy, continue gabapentin.  # Annual dilated diabetic eye exams.   - Diet: Make healthy diabetic food choices - Life style / activity / exercise: Discussed.  Not able to do exercise due to left  foot fracture.  2. Blood pressure  -  BP Readings from Last 1 Encounters:  10/29/23 (!) 138/98    - Control is not in target.  - No change in current plans.  Follow-up with primary care provider.  Blood pressure is mildly elevated today.  3. Lipid status / Hyperlipidemia - Last  Lab Results  Component Value Date   LDLCALC 86 10/15/2023   - Continue rosuvastatin 10 mg daily.  Diagnoses and all orders for this visit:  Uncontrolled type 2 diabetes mellitus with hyperglycemia, with long-term current use of insulin (HCC) -     glimepiride (AMARYL) 1 MG tablet; Take 2 tablet daily. -     Microalbumin / creatinine urine ratio; Future -     Hemoglobin A1c; Future -     Basic metabolic panel; Future -     Insulin Pen Needle 32G X 5 MM MISC; 1 Needle by Does not apply route daily. Use Daily with Insulin Pen  Type 2 diabetes mellitus with hyperglycemia, with long-term current use of insulin (HCC) -     insulin degludec (TRESIBA FLEXTOUCH) 100 UNIT/ML FlexTouch Pen; Inject 30 Units into the skin daily.  Diabetic peripheral neuropathy associated with type 2 diabetes mellitus (HCC) -     glucose blood (CONTOUR NEXT TEST) test strip; TEST TWICE DAILY  Essential hypertension  Other orders -     empagliflozin (JARDIANCE) 25 MG TABS tablet; Take 1 tablet (25 mg total) by mouth daily.    DISPOSITION Follow up in clinic in 3 months suggested.   All questions answered and patient verbalized understanding of the plan.  Jack Chael Urenda, MD [facility] Endocrinology [facility] [address] [place], [address] [place], [place] [address] Phone # [phone]  At least part of this note was generated using voice recognition software. Inadvertent word errors may have occurred, which were not recognized during the proofreading process.

## 2023-11-03 ENCOUNTER — Telehealth: Payer: Self-pay | Admitting: Orthopedic Surgery

## 2023-11-03 NOTE — Telephone Encounter (Signed)
I called and left a message for Jack Wallace to return my call to discuss scheduling surgery.

## 2023-11-10 ENCOUNTER — Other Ambulatory Visit: Payer: Self-pay

## 2023-11-10 ENCOUNTER — Encounter (HOSPITAL_COMMUNITY): Payer: Self-pay | Admitting: Orthopedic Surgery

## 2023-11-10 NOTE — Progress Notes (Addendum)
PCP - Kathleen Lime, MD [place], [place], MD [place], MD Last office visit (10-29-23)  Cardiologist - Truett Mainland, MD (last office visit 10-19-23)  PPM/ICD - deneis Device Orders - n/a Rep Notified - n/a  Chest x-ray - 09-16-23 EKG - 09-16-23 Stress Test -  ECHO - 10-20-23 Cardiac Cath -   CPAP - denies  Fasting Blood Sugar - Around 150 per patient Checks Blood Sugar daily but thru out the day  A 1C 8-3- on 10-15-23  Blood Thinner Instructions: denies Aspirin Instructions: n/a  ERAS Protcol - clear liquids until 5:40 am  COVID TEST- n/a  Anesthesia review: yes hx of DM, HTN, and saw cardiology and had testing done prior to surgery  Patient verbally denies any shortness of breath, fever, cough and chest pain during phone call   -------------  SDW INSTRUCTIONS given:  Your procedure is scheduled on November 12, 2023.  Report to [facility] Main Entrance "A" at 6:10 A.M., and check in at the Admitting office.  Call this number if you have problems the morning of surgery:  [phone]   Remember:  Do not eat after midnight the night before your surgery  You may drink clear liquids until 5:40 the morning of your surgery.   Clear liquids allowed are: Water, Non-Citrus Juices (without pulp), Carbonated Beverages, Clear Tea, Black Coffee Only, and Gatorade    Take these medicines the morning of surgery with A SIP OF WATER  amLODipine (NORVASC)  gabapentin (NEURONTIN)  pantoprazole (PROTONIX)  rosuvastatin (CRESTOR)  sertraline (ZOLOFT)   IF NEEDED     As of today, STOP taking any Aspirin (unless otherwise instructed by your surgeon) Aleve, Naproxen, Ibuprofen, Motrin, Advil, Goody's, BC's, all herbal medications, fish oil, and all vitamins. THIS INCLUDES YOUR diclofenac Sodium (VOLTAREN)  WHAT DO I DO ABOUT MY DIABETES MEDICATION?   Do not take oral diabetes medicines (pills) the morning of surgery :       empagliflozin (JARDIANCE) LAST DOSE 11-08-23        glimepiride (AMARYL)           LAST DOSE       metFORMIN (GLUCOPHAGE-XR) LAST DOSE 11-11-23           insulin degludec (TRESIBA FLEXTOUCH) Takes AM dose  11-11-23   will be in surgery for 11-12-23 per patient he takes medication around 1000    The day of surgery, do not take other diabetes injectables, including Byetta (exenatide), Bydureon (exenatide ER), Victoza (liraglutide), or Trulicity (dulaglutide).  If your CBG is greater than 220 mg/dL, you may take  of your sliding scale (correction) dose of insulin.   HOW TO MANAGE YOUR DIABETES BEFORE AND AFTER SURGERY  Why is it important to control my blood sugar before and after surgery? Improving blood sugar levels before and after surgery helps healing and can limit problems. A way of improving blood sugar control is eating a healthy diet by:  Eating less sugar and carbohydrates  Increasing activity/exercise  Talking with your doctor about reaching your blood sugar goals High blood sugars (greater than 180 mg/dL) can raise your risk of infections and slow your recovery, so you will need to focus on controlling your diabetes during the weeks before surgery. Make sure that the doctor who takes care of your diabetes knows about your planned surgery including the date and location.  How do I manage my blood sugar before surgery? Check your blood sugar at least 4 times a day, starting 2 days before  surgery, to make sure that the level is not too high or low.  Check your blood sugar the morning of your surgery when you wake up and every 2 hours until you get to the Short Stay unit.  If your blood sugar is less than 70 mg/dL, you will need to treat for low blood sugar: Do not take insulin. Treat a low blood sugar (less than 70 mg/dL) with  cup of clear juice (cranberry or apple), 4 glucose tablets, OR glucose gel. Recheck blood sugar in 15 minutes after treatment (to make sure it is greater than 70 mg/dL). If your blood sugar is not  greater than 70 mg/dL on recheck, call [phone] for further instructions. Report your blood sugar to the short stay nurse when you get to Short Stay.  If you are admitted to the hospital after surgery: Your blood sugar will be checked by the staff and you will probably be given insulin after surgery (instead of oral diabetes medicines) to make sure you have good blood sugar levels. The goal for blood sugar control after surgery is 80-180 mg/dL.                      Do not wear jewelry, make up, or nail polish            Do not wear lotions, powders, perfumes/colognes, or deodorant.            Do not shave 48 hours prior to surgery.  Men may shave face and neck.            Do not bring valuables to the hospital.            [facility] is not responsible for any belongings or valuables.  Do NOT Smoke (Tobacco/Vaping) 24 hours prior to your procedure If you use a CPAP at night, you may bring all equipment for your overnight stay.   Contacts, glasses, dentures or bridgework may not be worn into surgery.      For patients admitted to the hospital, discharge time will be determined by your treatment team.   Patients discharged the day of surgery will not be allowed to drive home, and someone needs to stay with them for 24 hours.    Special instructions:   [facility]- Preparing For Surgery  Before surgery, you can play an important role. Because skin is not sterile, your skin needs to be as free of germs as possible. You can reduce the number of germs on your skin by washing with CHG (chlorahexidine gluconate) Soap before surgery.  CHG is an antiseptic cleaner which kills germs and bonds with the skin to continue killing germs even after washing.    Oral Hygiene is also important to reduce your risk of infection.  Remember - BRUSH YOUR TEETH THE MORNING OF SURGERY WITH YOUR REGULAR TOOTHPASTE  Please do not use if you have an allergy to CHG or antibacterial soaps. If your skin becomes  reddened/irritated stop using the CHG.  Do not shave (including legs and underarms) for at least 48 hours prior to first CHG shower. It is OK to shave your face.  Please follow these instructions carefully.   Shower the NIGHT BEFORE SURGERY and the MORNING OF SURGERY with DIAL Soap.   Pat yourself dry with a CLEAN TOWEL.  Wear CLEAN PAJAMAS to bed the night before surgery  Place CLEAN SHEETS on your bed the night of your first shower and DO NOT SLEEP  WITH PETS.   Day of Surgery: Please shower morning of surgery  Wear Clean/Comfortable clothing the morning of surgery Do not apply any deodorants/lotions.   Remember to brush your teeth WITH YOUR REGULAR TOOTHPASTE.   Questions were answered. Patient verbalized understanding of instructions.

## 2023-11-11 NOTE — Progress Notes (Signed)
Anesthesia Chart Review: SAME DAY WORK-UP  Case: [id number] Date/Time: 11/12/23 0825   Procedure: OPEN REDUCTION INTERNAL FIXATION (ORIF) 5th METATARSAL (TOE) FRACTURE LEFT FOOT (Left: Toe)   Anesthesia type: Choice   Pre-op diagnosis: Non [place] Fracture Left Foot   Location: [facility] ROOM 07 / [facility] OR   Surgeons: Nadara Mustard, MD       DISCUSSION: Patient is a 62 year old male scheduled for the above procedure.  History includes never smoker, HTN, DM2, HLD, GERD, ischemic colitis (04/2014), depression, tachycardia.  He had cardiology evaluation with Dr. Rosemary Holms on 10/19/23 for sinus tachycardia. He had been sent to the ED from orthopedic office on 09/16/23 after he presented for left foot fracture follow-up and was noted to be tachycardic at 140 with  in the 140's with intermittent mild hypoxia. EKG showed ST at 117 bpm and down to 100-105 bpm after 2L IVF. Lab workup is reassuring. No electrolyte abnormalities, normal troponins, negative D-dimer, no fevers or leukocytosis to suggest an infection. CXR negative for pneumonia. Dehydration thought to be contributing to tachycardia. He was referred for out-patient cardiology evaluation and seen on 10/19/23. Due to significant decrease in activity since foot fracture and persistent resting tachycardia, stat echo, CTA PE and proBNP ordered. TSH normal in 09/16/23. 10/19/23 CT chest negative for PE. NT-Pro BNP normal at 49. Echo on 10/20/23 showed LVEF 55 to 60%, LV endocardial border not optimally defined to evaluate regional wall motion, grade 1 diastolic dysfunction, normal RV systolic function, no significant valvular disease, right bowing of the interatrial septum suggestive of elevated left atrial pressure, no atrial level shunt detected by color-flow Doppler. His monitor is still pending, but in the meantime, Dr. Joaquim Nam wrote, "Heart function is normal as well. I do not see any contraindication to proceed with surgery. Monitor will tell us about  average heart rate."    A1c 8.3% on 10/15/23.  He is on Jardiance, Amaryl, Tresiba, metformin.  He reported last Jardiance 11/08/2023.  Anesthesia team to evaluate on the day of surgery.  VS: Ht 6' (1.829 m)   Wt (!) 144 kg   BMI 43.06 kg/m   BP Readings from Last 3 Encounters:  10/29/23 (!) 138/98  10/19/23 (!) 140/94  10/06/23 (!) 132/101   Pulse Readings from Last 3 Encounters:  10/29/23 (!) 119  10/19/23 (!) 114  10/06/23 (!) 115     PROVIDERS: Kathleen Lime, MD is PCP  Truett Mainland, MD is cardiologist [place], [place], MD is endocrinologist   LABS: For day of surgery as indicated. Most recent results in [facility] include: Lab Results  Component Value Date   WBC 8.5 09/16/2023   HGB 16.5 09/16/2023   HCT 51.5 09/16/2023   PLT 260 09/16/2023   GLUCOSE 203 (H) 10/19/2023   CHOL 169 10/15/2023   TRIG 164.0 (H) 10/15/2023   HDL 49.80 10/15/2023   LDLDIRECT 85.0 07/30/2022   LDLCALC 86 10/15/2023   ALT 14 10/15/2023   AST 16 10/15/2023   NA 140 10/19/2023   K 4.1 10/19/2023   CL 104 10/19/2023   CREATININE 1.11 10/19/2023   BUN 18 10/19/2023   CO2 23 10/19/2023   TSH 2.806 09/16/2023   HGBA1C 8.3 (H) 10/15/2023     IMAGES: CTA Chest 10/19/23: IMPRESSION: 1. No evidence of pulmonary embolus. 2. No acute intrathoracic process.   EKG: EKG 10/19/2023: Sinus tachycardia 112 bpm When compared with ECG of 16-Sep-2023 11:35, No significant change since Confirmed by Truett Mainland [phone]) on 10/19/2023 9:04:16  AM   CV: Echo 10/20/23: IMPRESSIONS   1. Left ventricular ejection fraction, by estimation, is 55 to 60%. The  left ventricle has normal function. Left ventricular endocardial border  not optimally defined to evaluate regional wall motion. Left ventricular  diastolic parameters are consistent  with Grade I diastolic dysfunction (impaired relaxation).   2. Right ventricular systolic function is normal. The right ventricular  size is normal.  Tricuspid regurgitation signal is inadequate for assessing  PA pressure.   3. The mitral valve is normal in structure. No evidence of mitral valve  regurgitation. No evidence of mitral stenosis.   4. The aortic valve is normal in structure. Aortic valve regurgitation is  not visualized. No aortic stenosis is present.   5. There is right bowing of the interatrial septum, suggestive of  elevated left atrial pressure. No atrial level shunt detected by color  flow Doppler.    Past Medical History:  Diagnosis Date   Depression    Diabetes mellitus without complication (HCC)    GERD (gastroesophageal reflux disease)    Hyperlipidemia    Hypertension    Ischemic colitis (HCC)    Tachycardia     Past Surgical History:  Procedure Laterality Date   COLONOSCOPY  06/01/2014   NASAL SEPTUM SURGERY      MEDICATIONS: No current facility-administered medications for this encounter.    amLODipine (NORVASC) 5 MG tablet   empagliflozin (JARDIANCE) 25 MG TABS tablet   gabapentin (NEURONTIN) 600 MG tablet   glimepiride (AMARYL) 1 MG tablet   insulin degludec (TRESIBA FLEXTOUCH) 100 UNIT/ML FlexTouch Pen   losartan (COZAAR) 50 MG tablet   Magnesium Oxide 400 MG CAPS   metFORMIN (GLUCOPHAGE-XR) 500 MG 24 hr tablet   Multiple Vitamin (MULTIVITAMIN WITH MINERALS) TABS tablet   pantoprazole (PROTONIX) 40 MG tablet   rosuvastatin (CRESTOR) 10 MG tablet   sertraline (ZOLOFT) 100 MG tablet   vitamin B-12 (CYANOCOBALAMIN) 500 MCG tablet   BAYER MICROLET LANCETS lancets   Blood Glucose Monitoring Suppl (CONTOUR NEXT EZ MONITOR) w/Device KIT   diclofenac Sodium (VOLTAREN) 1 % GEL   glucose blood (CONTOUR NEXT TEST) test strip   Insulin Pen Needle 32G X 5 MM MISC    Shonna Chock, PA-C Surgical Short Stay/Anesthesiology [facility] Phone [phone] [facility] Phone [phone] 11/11/2023 12:04 PM

## 2023-11-11 NOTE — Anesthesia Preprocedure Evaluation (Addendum)
Anesthesia Evaluation  Patient identified by MRN, date of birth, ID band Patient awake    Reviewed: Allergy & Precautions, NPO status , Patient's Chart, lab work & pertinent test results  History of Anesthesia Complications Negative for: history of anesthetic complications  Airway Mallampati: III  TM Distance: >3 FB Neck ROM: Full    Dental  (+) Dental Advisory Given   Pulmonary neg pulmonary ROS   Pulmonary exam normal breath sounds clear to auscultation       Cardiovascular hypertension (amlodipine, losartan), Pt. on medications (-) angina (-) Past MI, (-) Cardiac Stents and (-) CABG + dysrhythmias (tachycardia)  Rhythm:Regular Rate:Normal  HLD  TTE 10/20/2023: 1. Left ventricular ejection fraction, by estimation, is 55 to 60%. The  left ventricle has normal function. Left ventricular endocardial border  not optimally defined to evaluate regional wall motion. Left ventricular  diastolic parameters are consistent  with Grade I diastolic dysfunction (impaired relaxation).   2. Right ventricular systolic function is normal. The right ventricular  size is normal. Tricuspid regurgitation signal is inadequate for assessing  PA pressure.   3. The mitral valve is normal in structure. No evidence of mitral valve  regurgitation. No evidence of mitral stenosis.   4. The aortic valve is normal in structure. Aortic valve regurgitation is  not visualized. No aortic stenosis is present.   5. There is right bowing of the interatrial septum, suggestive of  elevated left atrial pressure. No atrial level shunt detected by color  flow Doppler.     Neuro/Psych neg Seizures PSYCHIATRIC DISORDERS  Depression     Neuromuscular disease (peripheral neuropathy)    GI/Hepatic Neg liver ROS,GERD  Medicated,,  Endo/Other  diabetes, Type 2, Oral Hypoglycemic Agents  Class 3 obesity  Renal/GU negative Renal ROS     Musculoskeletal    Abdominal  (+) + obese  Peds  Hematology negative hematology ROS (+) Lab Results      Component                Value               Date                      WBC                      8.5                 09/16/2023                HGB                      16.5                09/16/2023                HCT                      51.5                09/16/2023                MCV                      89.9                09/16/2023  PLT                      260                 09/16/2023              Anesthesia Other Findings   Reproductive/Obstetrics                             Anesthesia Physical Anesthesia Plan  ASA: 3  Anesthesia Plan: Regional and MAC   Post-op Pain Management: Tylenol PO (pre-op)*   Induction: Intravenous  PONV Risk Score and Plan: 1 and Ondansetron, Dexamethasone and Treatment may vary due to age or medical condition  Airway Management Planned: Natural Airway and Simple Face Mask  Additional Equipment:   Intra-op Plan:   Post-operative Plan: Extubation in OR  Informed Consent: I have reviewed the patients History and Physical, chart, labs and discussed the procedure including the risks, benefits and alternatives for the proposed anesthesia with the patient or authorized representative who has indicated his/her understanding and acceptance.     Dental advisory given  Plan Discussed with: CRNA and Anesthesiologist  Anesthesia Plan Comments: (PAT note written 11/11/2023 by Shonna Chock, PA-C.  Discussed potential risks of nerve blocks including, but not limited to, infection, bleeding, nerve damage, seizures, pneumothorax, respiratory depression, and potential failure of the block. Alternatives to nerve blocks discussed. All questions answered.  Discussed with patient risks of MAC including, but not limited to, minor pain or discomfort, hearing people in the room, and possible need for backup general anesthesia.  Risks for general anesthesia also discussed including, but not limited to, sore throat, hoarse voice, chipped/damaged teeth, injury to vocal cords, nausea and vomiting, allergic reactions, lung infection, heart attack, stroke, and death. All questions answered.   )       Anesthesia Quick Evaluation

## 2023-11-12 ENCOUNTER — Ambulatory Visit (HOSPITAL_COMMUNITY): Payer: Worker's Compensation | Admitting: Vascular Surgery

## 2023-11-12 ENCOUNTER — Encounter (HOSPITAL_COMMUNITY): Payer: Self-pay | Admitting: Orthopedic Surgery

## 2023-11-12 ENCOUNTER — Encounter (HOSPITAL_COMMUNITY)
Admission: RE | Disposition: A | Discharge status: Home / Self Care | Payer: Self-pay | Source: Home / Self Care | Attending: Orthopedic Surgery

## 2023-11-12 ENCOUNTER — Other Ambulatory Visit: Payer: Self-pay

## 2023-11-12 ENCOUNTER — Ambulatory Visit (HOSPITAL_COMMUNITY)
Admission: RE | Admit: 2023-11-12 | Discharge: 2023-11-12 | Disposition: A | Discharge status: Home / Self Care | Payer: Worker's Compensation | Attending: Orthopedic Surgery | Admitting: Orthopedic Surgery

## 2023-11-12 ENCOUNTER — Ambulatory Visit (HOSPITAL_COMMUNITY): Payer: Worker's Compensation

## 2023-11-12 DIAGNOSIS — M84375K Stress fracture, left foot, subsequent encounter for fracture with nonunion: Secondary | ICD-10-CM | POA: Diagnosis not present

## 2023-11-12 DIAGNOSIS — K219 Gastro-esophageal reflux disease without esophagitis: Secondary | ICD-10-CM | POA: Diagnosis not present

## 2023-11-12 DIAGNOSIS — E66813 Obesity, class 3: Secondary | ICD-10-CM | POA: Diagnosis not present

## 2023-11-12 DIAGNOSIS — I10 Essential (primary) hypertension: Secondary | ICD-10-CM | POA: Diagnosis not present

## 2023-11-12 DIAGNOSIS — Y990 Civilian activity done for income or pay: Secondary | ICD-10-CM | POA: Diagnosis not present

## 2023-11-12 DIAGNOSIS — F32A Depression, unspecified: Secondary | ICD-10-CM | POA: Diagnosis not present

## 2023-11-12 DIAGNOSIS — S92352A Displaced fracture of fifth metatarsal bone, left foot, initial encounter for closed fracture: Secondary | ICD-10-CM | POA: Diagnosis present

## 2023-11-12 DIAGNOSIS — E1142 Type 2 diabetes mellitus with diabetic polyneuropathy: Secondary | ICD-10-CM | POA: Insufficient documentation

## 2023-11-12 DIAGNOSIS — Z7984 Long term (current) use of oral hypoglycemic drugs: Secondary | ICD-10-CM | POA: Insufficient documentation

## 2023-11-12 DIAGNOSIS — W19XXXA Unspecified fall, initial encounter: Secondary | ICD-10-CM | POA: Insufficient documentation

## 2023-11-12 DIAGNOSIS — Z794 Long term (current) use of insulin: Secondary | ICD-10-CM | POA: Diagnosis not present

## 2023-11-12 DIAGNOSIS — Z6841 Body Mass Index (BMI) 40.0 and over, adult: Secondary | ICD-10-CM | POA: Insufficient documentation

## 2023-11-12 DIAGNOSIS — Z79899 Other long term (current) drug therapy: Secondary | ICD-10-CM | POA: Diagnosis not present

## 2023-11-12 DIAGNOSIS — S92352K Displaced fracture of fifth metatarsal bone, left foot, subsequent encounter for fracture with nonunion: Secondary | ICD-10-CM

## 2023-11-12 HISTORY — DX: Gastro-esophageal reflux disease without esophagitis: K21.9

## 2023-11-12 HISTORY — PX: ORIF TOE FRACTURE: SHX5032

## 2023-11-12 LAB — GLUCOSE, CAPILLARY
Glucose-Capillary: 207 mg/dL — ABNORMAL HIGH (ref 70–99)
Glucose-Capillary: 220 mg/dL — ABNORMAL HIGH (ref 70–99)
Glucose-Capillary: 147 mg/dL — ABNORMAL HIGH (ref 70–99)
Glucose-Capillary: 158 mg/dL — ABNORMAL HIGH (ref 70–99)
Glucose-Capillary: 180 mg/dL — ABNORMAL HIGH (ref 70–99)

## 2023-11-12 LAB — CBC
HCT: 45.4 % (ref 39.0–52.0)
Hemoglobin: 14.4 g/dL (ref 13.0–17.0)
MCH: 28.2 pg (ref 26.0–34.0)
MCHC: 31.7 g/dL (ref 30.0–36.0)
MCV: 89 fL (ref 80.0–100.0)
Platelets: 220 10*3/uL (ref 150–400)
RBC: 5.1 MIL/uL (ref 4.22–5.81)
RDW: 13.1 % (ref 11.5–15.5)
WBC: 6.5 10*3/uL (ref 4.0–10.5)
nRBC: 0 % (ref 0.0–0.2)

## 2023-11-12 SURGERY — OPEN REDUCTION INTERNAL FIXATION (ORIF) METATARSAL (TOE) FRACTURE
Anesthesia: Monitor Anesthesia Care | Site: Toe | Laterality: Left

## 2023-11-12 MED ORDER — INSULIN ASPART 100 UNIT/ML IJ SOLN
0.0000 [IU] | INTRAMUSCULAR | Status: DC | PRN
  Administered 2023-11-12: 4 [IU] via SUBCUTANEOUS

## 2023-11-12 MED ORDER — PROPOFOL 500 MG/50ML IV EMUL
INTRAVENOUS | Status: DC | PRN
  Administered 2023-11-12: 50 ug/kg/min via INTRAVENOUS

## 2023-11-12 MED ORDER — PROPOFOL 10 MG/ML IV BOLUS
INTRAVENOUS | Status: AC
  Filled 2023-11-12: qty 20

## 2023-11-12 MED ORDER — ORAL CARE MOUTH RINSE: 15.0000 mL | Freq: Once | OROMUCOSAL | Status: AC

## 2023-11-12 MED ORDER — ORAL CARE MOUTH RINSE: 15.0000 mL | Freq: Once | OROMUCOSAL | Status: DC

## 2023-11-12 MED ORDER — CEFAZOLIN IN SODIUM CHLORIDE 3-0.9 GM/100ML-% IV SOLN
3.0000 g | INTRAVENOUS | Status: AC
  Administered 2023-11-12: 3 g via INTRAVENOUS

## 2023-11-12 MED ORDER — MIDAZOLAM HCL 2 MG/2ML IJ SOLN: 1.0000 mg | Freq: Once | INTRAMUSCULAR | Status: DC

## 2023-11-12 MED ORDER — LACTATED RINGERS IV SOLN: INTRAVENOUS | Status: DC

## 2023-11-12 MED ORDER — ACETAMINOPHEN 500 MG PO TABS
1000.0000 mg | ORAL_TABLET | Freq: Once | ORAL | Status: AC
  Administered 2023-11-12: 1000 mg via ORAL

## 2023-11-12 MED ORDER — FENTANYL CITRATE (PF) 100 MCG/2ML IJ SOLN: 50.0000 ug | Freq: Once | INTRAMUSCULAR | Status: DC

## 2023-11-12 MED ORDER — ACETAMINOPHEN 500 MG PO TABS
ORAL_TABLET | ORAL | Status: AC
  Filled 2023-11-12: qty 2

## 2023-11-12 MED ORDER — ONDANSETRON HCL 4 MG/2ML IJ SOLN
INTRAMUSCULAR | Status: DC | PRN
  Administered 2023-11-12: 4 mg via INTRAVENOUS

## 2023-11-12 MED ORDER — CEFAZOLIN SODIUM-DEXTROSE 2-4 GM/100ML-% IV SOLN
INTRAVENOUS | Status: AC
  Filled 2023-11-12: qty 100

## 2023-11-12 MED ORDER — PROPOFOL 10 MG/ML IV BOLUS
INTRAVENOUS | Status: DC | PRN
  Administered 2023-11-12: 30 mg via INTRAVENOUS

## 2023-11-12 MED ORDER — ROPIVACAINE HCL 5 MG/ML IJ SOLN
INTRAMUSCULAR | Status: DC | PRN
  Administered 2023-11-12: 30 mL via PERINEURAL

## 2023-11-12 MED ORDER — CHLORHEXIDINE GLUCONATE 0.12 % MT SOLN
15.0000 mL | Freq: Once | OROMUCOSAL | Status: AC
Start: 2023-11-12 — End: 2023-11-12

## 2023-11-12 MED ORDER — MIDAZOLAM HCL 2 MG/2ML IJ SOLN
INTRAMUSCULAR | Status: AC
  Filled 2023-11-12: qty 2

## 2023-11-12 MED ORDER — FENTANYL CITRATE (PF) 100 MCG/2ML IJ SOLN
INTRAMUSCULAR | Status: AC
  Filled 2023-11-12: qty 2

## 2023-11-12 MED ORDER — CHLORHEXIDINE GLUCONATE 0.12 % MT SOLN
OROMUCOSAL | Status: AC
  Administered 2023-11-12: 15 mL via OROMUCOSAL
  Filled 2023-11-12: qty 15

## 2023-11-12 MED ORDER — OXYCODONE-ACETAMINOPHEN 5-325 MG PO TABS: 1.0000 | ORAL_TABLET | ORAL | 0 refills | Status: DC | PRN

## 2023-11-12 MED ORDER — 0.9 % SODIUM CHLORIDE (POUR BTL) OPTIME
TOPICAL | Status: DC | PRN
  Administered 2023-11-12: 1000 mL

## 2023-11-12 MED ORDER — MIDAZOLAM HCL 2 MG/2ML IJ SOLN
INTRAMUSCULAR | Status: AC
  Administered 2023-11-12: 2 mg via INTRAVENOUS
  Filled 2023-11-12: qty 2

## 2023-11-12 MED ORDER — MIDAZOLAM HCL 2 MG/2ML IJ SOLN: 2.0000 mg | Freq: Once | INTRAMUSCULAR | Status: AC

## 2023-11-12 MED ORDER — CHLORHEXIDINE GLUCONATE 0.12 % MT SOLN: 15.0000 mL | Freq: Once | OROMUCOSAL | Status: DC

## 2023-11-12 MED ORDER — MIDAZOLAM HCL 2 MG/2ML IJ SOLN
INTRAMUSCULAR | Status: DC | PRN
  Administered 2023-11-12: 2 mg via INTRAVENOUS

## 2023-11-12 MED ORDER — FENTANYL CITRATE (PF) 250 MCG/5ML IJ SOLN
INTRAMUSCULAR | Status: AC
  Filled 2023-11-12: qty 5

## 2023-11-12 SURGICAL SUPPLY — 49 items
BAG COUNTER SPONGE SURGICOUNT (BAG) ×1 IMPLANT
BIT DRILL CANN 3/16 4.2X150 (DRILL) IMPLANT
BLADE AVERAGE 25X9 (BLADE) IMPLANT
BLADE MINI RND TIP GREEN BEAV (BLADE) IMPLANT
BNDG COHESIVE 1X5 TAN STRL LF (GAUZE/BANDAGES/DRESSINGS) IMPLANT
BNDG COHESIVE 6X5 TAN NS LF (GAUZE/BANDAGES/DRESSINGS) IMPLANT
BNDG COHESIVE 6X5 TAN ST LF (GAUZE/BANDAGES/DRESSINGS) IMPLANT
BNDG ESMARK 4X9 LF (GAUZE/BANDAGES/DRESSINGS) ×1 IMPLANT
BNDG GAUZE DERMACEA FLUFF 4 (GAUZE/BANDAGES/DRESSINGS) IMPLANT
CORD BIPOLAR FORCEPS 12FT (ELECTRODE) ×1 IMPLANT
COTTON STERILE ROLL (GAUZE/BANDAGES/DRESSINGS) IMPLANT
COVER SURGICAL LIGHT HANDLE (MISCELLANEOUS) ×2 IMPLANT
CUFF TOURN SGL QUICK 18X4 (TOURNIQUET CUFF) IMPLANT
CUFF TOURN SGL QUICK 42 (TOURNIQUET CUFF) IMPLANT
CUFF TRNQT CYL 24X4X16.5-23 (TOURNIQUET CUFF) IMPLANT
CUFF TRNQT CYL 34X4.125X (TOURNIQUET CUFF) IMPLANT
DRAPE OEC MINIVIEW 54X84 (DRAPES) IMPLANT
DRAPE U-SHAPE 47X51 STRL (DRAPES) ×1 IMPLANT
DRILL CANN 3/16 4.2X150 (DRILL) ×1
DRSG ADAPTIC 3X8 NADH LF (GAUZE/BANDAGES/DRESSINGS) IMPLANT
DURAPREP 26ML APPLICATOR (WOUND CARE) ×1 IMPLANT
ELECT REM PT RETURN 9FT ADLT (ELECTROSURGICAL) ×1
ELECTRODE REM PT RTRN 9FT ADLT (ELECTROSURGICAL) ×1 IMPLANT
GAUZE PAD ABD 8X10 STRL (GAUZE/BANDAGES/DRESSINGS) IMPLANT
GAUZE SPONGE 2X2 8PLY STRL LF (GAUZE/BANDAGES/DRESSINGS) IMPLANT
GAUZE SPONGE 4X4 12PLY STRL (GAUZE/BANDAGES/DRESSINGS) IMPLANT
GLOVE BIOGEL PI IND STRL 9 (GLOVE) ×1 IMPLANT
GLOVE SURG ORTHO 9.0 STRL STRW (GLOVE) ×1 IMPLANT
GOWN STRL REUS W/ TWL XL LVL3 (GOWN DISPOSABLE) ×2 IMPLANT
KIT BASIN OR (CUSTOM PROCEDURE TRAY) ×1 IMPLANT
KIT TURNOVER KIT B (KITS) ×1 IMPLANT
MANIFOLD NEPTUNE II (INSTRUMENTS) ×1 IMPLANT
NDL HYPO 25GX1X1/2 BEV (NEEDLE) IMPLANT
NEEDLE HYPO 25GX1X1/2 BEV (NEEDLE) IMPLANT
NS IRRIG 1000ML POUR BTL (IV SOLUTION) ×1 IMPLANT
PACK ORTHO EXTREMITY (CUSTOM PROCEDURE TRAY) ×1 IMPLANT
PAD ARMBOARD 7.5X6 YLW CONV (MISCELLANEOUS) ×2 IMPLANT
PAD CAST 4YDX4 CTTN HI CHSV (CAST SUPPLIES) IMPLANT
SCREW CANN MONSTER BT 6.2X56 (Screw) IMPLANT
SPECIMEN JAR SMALL (MISCELLANEOUS) ×1 IMPLANT
SUCTION TUBE FRAZIER 10FR DISP (SUCTIONS) IMPLANT
SUT ETHILON 2 0 PSLX (SUTURE) ×2 IMPLANT
SUT VIC AB 2-0 CT1 TAPERPNT 27 (SUTURE) ×1 IMPLANT
SYR CONTROL 10ML LL (SYRINGE) IMPLANT
TOWEL GREEN STERILE (TOWEL DISPOSABLE) ×1 IMPLANT
TOWEL GREEN STERILE FF (TOWEL DISPOSABLE) ×1 IMPLANT
TUBE CONNECTING 12X1/4 (SUCTIONS) IMPLANT
WATER STERILE IRR 1000ML POUR (IV SOLUTION) ×1 IMPLANT
WIRE SMOOTH NITINOL 1.6X200 (WIRE) IMPLANT

## 2023-11-12 NOTE — Op Note (Signed)
11/12/2023  11:35 AM  PATIENT:  Jack Wallace    PRE-OPERATIVE DIAGNOSIS:  Non Union Jones Fracture Left Foot  POST-OPERATIVE DIAGNOSIS:  Same  PROCEDURE:  OPEN REDUCTION INTERNAL FIXATION (ORIF) 5th METATARSAL (TOE) FRACTURE LEFT FOOT C arm fluoroscopy to verify reduction.  SURGEON:  Nadara Mustard, MD  PHYSICIAN ASSISTANT:None ANESTHESIA:   General  PREOPERATIVE INDICATIONS:  [name] is a  62 y.o. male with a diagnosis of Non Union Jones Fracture Left Foot who failed conservative measures and elected for surgical management.    The risks benefits and alternatives were discussed with the patient preoperatively including but not limited to the risks of infection, bleeding, nerve injury, cardiopulmonary complications, the need for revision surgery, among others, and the patient was willing to proceed.  OPERATIVE IMPLANTS:   Implant Name Type Inv. Item Serial No. Manufacturer Lot No. LRB No. Used Action  6.2 mm Cann Jones Screw x 56    ZIMMER  Left 1 Implanted    @ENCIMAGES @  OPERATIVE FINDINGS: C arm fluoroscopy verified compression of the nonunion Jones fracture.  OPERATIVE PROCEDURE: Patient brought the operating room after undergoing a regional anesthetic.  After adequate levels anesthesia were obtained patient's left lower extremity was prepped using DuraPrep draped into a sterile field a timeout was called.  An incision was made longitudinally just proximal to the base of the fifth metatarsal.  A guidewire was placed high and inside at the base of the fifth metatarsal.  C-arm Shrosbree was used to guide the wire across the fracture site.  The canal was drilled for 4.2 mm for a 6.2 millimeters screw.  A 56 mm screw was inse and the fracture site was compressed.  See en face be verified reduction.  The incision was closed using 2-0 nylon a sterile dressing was applied patient was taken the PACU in stable condition.   DISCHARGE PLANNING:  Antibiotic duration: Preoperative  antibiotics  Weightbearing: Touchdown weightbearing on the left  Pain medication: Prescription for Percocet  Dressing care/ Wound VAC: Dry dressing  Ambulatory devices: Crutches and Cam boot  Discharge to: Home.  Follow-up: In the office 1 week post operative.

## 2023-11-12 NOTE — H&P (Signed)
Jack Wallace is an 62 y.o. male.   Chief Complaint: Painful nonunion fifth metatarsal fracture. HPI: Patient is a 62 year old gentleman who is seen for initial evaluation for Jones fracture left foot. Patient initially sustained a stress fracture 4 months ago in May was placed in a postoperative shoe. Patient subsequently fell at work on August 15 with increased pain approximately 4 weeks ago. Patient has been out of work in a fracture boot since that time.   Past Medical History:  Diagnosis Date   Depression    Diabetes mellitus without complication (HCC)    GERD (gastroesophageal reflux disease)    Hyperlipidemia    Hypertension    Ischemic colitis (HCC)    Tachycardia     Past Surgical History:  Procedure Laterality Date   COLONOSCOPY  06/01/2014   NASAL SEPTUM SURGERY      Family History  Problem Relation Age of Onset   Hypertension Mother    Stroke Father    Diabetes Maternal Grandfather    Colon cancer Neg Hx    Throat cancer Neg Hx    Prostate cancer Neg Hx    Pancreatic cancer Neg Hx    Heart disease Neg Hx    Kidney disease Neg Hx    Liver disease Neg Hx    Social History:  reports that he has never smoked. He has never used smokeless tobacco. He reports that he does not drink alcohol and does not use drugs.  Allergies:  Allergies  Allergen Reactions   Mounjaro [Tirzepatide] Diarrhea and Nausea Only    Medications Prior to Admission  Medication Sig Dispense Refill   amLODipine (NORVASC) 5 MG tablet TAKE 1 TABLET(5 MG) BY MOUTH DAILY 30 tablet 11   empagliflozin (JARDIANCE) 25 MG TABS tablet Take 1 tablet (25 mg total) by mouth daily. 90 tablet 3   gabapentin (NEURONTIN) 600 MG tablet TAKE 1 TABLET(600 MG) BY MOUTH THREE TIMES DAILY (Patient taking differently: Take 600-1,200 mg by mouth See admin instructions. TAKE 600 mg in the morning and 1200 mg at bedtime) 270 tablet 1   glimepiride (AMARYL) 1 MG tablet Take 2 tablet daily. (Patient taking differently: Take  1 mg by mouth 2 (two) times daily.) 180 tablet 3   insulin degludec (TRESIBA FLEXTOUCH) 100 UNIT/ML FlexTouch Pen Inject 30 Units into the skin daily. 30 mL 3   losartan (COZAAR) 50 MG tablet TAKE 1 TABLET(50 MG) BY MOUTH DAILY 30 tablet 11   Magnesium Oxide 400 MG CAPS Take 1 capsule (400 mg total) by mouth daily. (Patient taking differently: Take 250 mg by mouth 2 (two) times daily.) 30 capsule 0   metFORMIN (GLUCOPHAGE-XR) 500 MG 24 hr tablet TAKE  TABLETS BY MOUTH EVERY DAY WITH SUPPER (Patient taking differently: Take 1,500 mg by mouth daily with breakfast.) 270 tablet 2   Multiple Vitamin (MULTIVITAMIN WITH MINERALS) TABS tablet Take 1 tablet by mouth daily.     pantoprazole (PROTONIX) 40 MG tablet TAKE 1 TABLET(40 MG) BY MOUTH TWICE DAILY 30 tablet 11   rosuvastatin (CRESTOR) 10 MG tablet TAKE 1 TABLET(10 MG) BY MOUTH DAILY 30 tablet 3   sertraline (ZOLOFT) 100 MG tablet Take 2 tablets (200 mg total) by mouth daily. 60 tablet 11   vitamin B-12 (CYANOCOBALAMIN) 500 MCG tablet Take 500 mcg by mouth daily.     BAYER MICROLET LANCETS lancets Use to check blood sugar two times per day 100 each 2   Blood Glucose Monitoring Suppl (CONTOUR NEXT EZ MONITOR) w/Device  KIT Use to check blood sugar two times per day 1 kit 2   diclofenac Sodium (VOLTAREN) 1 % GEL Apply 2 g topically 4 (four) times daily. (Patient not taking: Reported on 11/09/2023) 100 g 2   glucose blood (CONTOUR NEXT TEST) test strip TEST TWICE DAILY 200 strip 2   Insulin Pen Needle 32G X 5 MM MISC 1 Needle by Does not apply route daily. Use Daily with Insulin Pen 100 each 1    Results for orders placed or performed during the hospital encounter of 11/12/23 (from the past 48 hour(s))  Glucose, capillary     Status: Abnormal   Collection Time: 11/12/23  6:31 AM  Result Value Ref Range   Glucose-Capillary 220 (H) 70 - 99 mg/dL    Comment: Glucose reference range applies only to samples taken after fasting for at least 8 hours.   Glucose, capillary     Status: Abnormal   Collection Time: 11/12/23  6:32 AM  Result Value Ref Range   Glucose-Capillary 207 (H) 70 - 99 mg/dL    Comment: Glucose reference range applies only to samples taken after fasting for at least 8 hours.   No results found.  Review of Systems  All other systems reviewed and are negative.   Blood pressure (!) 161/102, pulse (!) 112, temperature 98.9 F (37.2 C), temperature source Oral, resp. rate 20, height 6' (1.829 m), weight (!) 147.4 kg, SpO2 94%. Physical Exam  Patient is alert oriented no adenopathy well-dressed normal affect normal respiratory effort.  Patient has a palpable dorsalis pedis pulse.  Patient is tender to palpation at the fracture site.  Radiograph shows a nonunion of the Jones fracture.  There is no redness no cellulitis no signs of infection. Assessment/Plan Assessment: Closed fracture base of the fifth metatarsal metaphyseal diaphyseal junction.  Plan: Will plan for open reduction internal fixation with intramedullary screw.  Risk and benefits were discussed including persistent pain need for additional surgery.  Risk of neurovascular injury to the sural nerve.  Patient states he understands wished to proceed at this time.  Nadara Mustard, MD 11/12/2023, 6:37 AM

## 2023-11-12 NOTE — Anesthesia Procedure Notes (Signed)
Anesthesia Regional Block: Popliteal block   Pre-Anesthetic Checklist: , timeout performed,  Correct Patient, Correct Site, Correct Laterality,  Correct Procedure, Correct Position, site marked,  Risks and benefits discussed,  Surgical consent,  Pre-op evaluation,  At surgeon's request and post-op pain management  Laterality: Left  Prep: chloraprep       Needles:  Injection technique: Single-shot  Needle Type: Echogenic Stimulator Needle     Needle Length: 9cm  Needle Gauge: 21     Additional Needles:   Procedures:,,,, ultrasound used (permanent image in chart),,    Narrative:  Start time: 11/12/2023 7:20 AM End time: 11/12/2023 7:25 AM Injection made incrementally with aspirations every 5 mL.  Performed by: Personally  Anesthesiologist: Linton Rump, MD  Additional Notes: Discussed risks and benefits of nerve block including, but not limited to, prolonged and/or permanent nerve injury involving sensory and/or motor function. Monitors were applied and a time-out was performed. The nerve and associated structures were visualized under ultrasound guidance. After negative aspiration, local anesthetic was slowly injected around the nerve. There was no evidence of high pressure during the procedure. There were no paresthesias. VSS remained stable and the patient tolerated the procedure well.

## 2023-11-12 NOTE — Transfer of Care (Signed)
Immediate Anesthesia Transfer of Care Note  Patient: Jack Wallace  Procedure(s) Performed: OPEN REDUCTION INTERNAL FIXATION (ORIF) 5th METATARSAL (TOE) FRACTURE LEFT FOOT (Left: Toe)  Patient Location: PACU  Anesthesia Type:MAC and Regional  Level of Consciousness: drowsy and patient cooperative  Airway & Oxygen Therapy: Patient Spontanous Breathing and Patient connected to nasal cannula oxygen  Post-op Assessment: Report given to RN, Post -op Vital signs reviewed and stable, and Patient moving all extremities X 4  Post vital signs: Reviewed and stable  Last Vitals:  See PACU flowsheet, Vital signs stable before leaving   Last Pain:  Vitals:   11/12/23 0702  TempSrc:   PainSc: 7          Complications: No notable events documented.

## 2023-11-12 NOTE — Interval H&P Note (Signed)
History and Physical Interval Note:  11/12/2023 8:58 AM  Jack Wallace  has presented today for surgery, with the diagnosis of Non Union Jones Fracture Left Foot.  The various methods of treatment have been discussed with the patient and family. After consideration of risks, benefits and other options for treatment, the patient has consented to  Procedure(s): OPEN REDUCTION INTERNAL FIXATION (ORIF) 5th METATARSAL (TOE) FRACTURE LEFT FOOT (Left) as a surgical intervention.  The patient's history has been reviewed, patient examined, no change in status, stable for surgery.  I have reviewed the patient's chart and labs.  Questions were answered to the patient's satisfaction.     Nadara Mustard

## 2023-11-12 NOTE — Anesthesia Postprocedure Evaluation (Signed)
Anesthesia Post Note  Patient: Jack Wallace  Procedure(s) Performed: OPEN REDUCTION INTERNAL FIXATION (ORIF) 5th METATARSAL (TOE) FRACTURE LEFT FOOT (Left: Toe)     Patient location during evaluation: PACU Anesthesia Type: Regional and MAC Level of consciousness: awake Pain management: pain level controlled Vital Signs Assessment: post-procedure vital signs reviewed and stable Respiratory status: spontaneous breathing, nonlabored ventilation and respiratory function stable Cardiovascular status: stable and blood pressure returned to baseline Postop Assessment: no apparent nausea or vomiting Anesthetic complications: no   No notable events documented.  Last Vitals:  Vitals:   11/12/23 1200 11/12/23 1215  BP: (!) 159/95 (!) 176/98  Pulse: 75 75  Resp: 13 19  Temp:  36.6 C  SpO2: 95% 95%    Last Pain:  Vitals:   11/12/23 1200  TempSrc:   PainSc: 0-No pain                 Linton Rump

## 2023-11-15 ENCOUNTER — Encounter (HOSPITAL_COMMUNITY): Payer: Self-pay | Admitting: Orthopedic Surgery

## 2023-11-23 ENCOUNTER — Ambulatory Visit (INDEPENDENT_AMBULATORY_CARE_PROVIDER_SITE_OTHER): Payer: Worker's Compensation | Admitting: Family

## 2023-11-23 ENCOUNTER — Other Ambulatory Visit (INDEPENDENT_AMBULATORY_CARE_PROVIDER_SITE_OTHER): Payer: Worker's Compensation

## 2023-11-23 ENCOUNTER — Encounter: Payer: Self-pay | Admitting: Family

## 2023-11-23 DIAGNOSIS — S99192A Other physeal fracture of left metatarsal, initial encounter for closed fracture: Secondary | ICD-10-CM

## 2023-11-23 MED ORDER — OXYCODONE-ACETAMINOPHEN 5-325 MG PO TABS: 1.0000 | ORAL_TABLET | Freq: Four times a day (QID) | ORAL | 0 refills | Status: DC | PRN

## 2023-11-23 NOTE — Progress Notes (Unsigned)
   Post-Op Visit Note   Patient: Jack Wallace           Date of Birth: 06-11-1961           MRN: [id number] Visit Date: 11/23/2023 PCP: Kathleen Lime, MD  Chief Complaint: No chief complaint on file.   HPI:  HPI  the patient is a 62 year old gentleman seen status post ORIF left fifth metatarsal fracture.  Resides alone he currently has been using 1 crutch for ambulation has been trying to minimize his weightbearing however he is bearing weight.  Reports this is painful. Ortho Exam On examination left foot his incisions are well-approximated sutures healing well there is no gaping or drainage.  No erythema  Visit Diagnoses: No diagnosis found.  Plan: Recommended nonweightbearing.  His caseworker who accompanies the visit will get him set up with a kneeling scooter.  He will follow-up in 2 more weeks.  Follow-Up Instructions: Return in about 2 weeks (around 12/07/2023).   Imaging: No results found.  Orders:  No orders of the defined types were placed in this encounter.  No orders of the defined types were placed in this encounter.    PMFS History: Patient Active Problem List   Diagnosis Date Noted   Exertional dyspnea 10/19/2023   Tachycardia 10/18/2023   Stress fracture of metatarsal bone of left foot 05/24/2023   AKI (acute kidney injury) (HCC) 09/02/2022   Bilateral leg pain 07/03/2022   Hyperkalemia 01/07/2022   Pain of joint of left ankle and foot 04/01/2018   MDD (major depressive disorder) 11/13/2015   GERD (gastroesophageal reflux disease) 08/20/2015   Chronic nausea 03/29/2015   Preventative health care 08/23/2014   Diabetic neuropathy (HCC) 08/01/2013   Hypertension associated with diabetes (HCC) 07/31/2013   Hyperlipidemia 07/31/2013   Type 2 diabetes mellitus with peripheral neuropathy (HCC) 07/31/1992   Past Medical History:  Diagnosis Date   Depression    Diabetes mellitus without complication (HCC)    GERD (gastroesophageal reflux disease)     Hyperlipidemia    Hypertension    Ischemic colitis (HCC)    Tachycardia     Family History  Problem Relation Age of Onset   Hypertension Mother    Stroke Father    Diabetes Maternal Grandfather    Colon cancer Neg Hx    Throat cancer Neg Hx    Prostate cancer Neg Hx    Pancreatic cancer Neg Hx    Heart disease Neg Hx    Kidney disease Neg Hx    Liver disease Neg Hx     Past Surgical History:  Procedure Laterality Date   COLONOSCOPY  06/01/2014   NASAL SEPTUM SURGERY     ORIF TOE FRACTURE Left 11/12/2023   Procedure: OPEN REDUCTION INTERNAL FIXATION (ORIF) 5th METATARSAL (TOE) FRACTURE LEFT FOOT;  Surgeon: Nadara Mustard, MD;  Location: [facility] OR;  Service: Orthopedics;  Laterality: Left;   Social History   Occupational History   Occupation: Midwife  Tobacco Use   Smoking status: Never   Smokeless tobacco: Never  Vaping Use   Vaping status: Never Used  Substance and Sexual Activity   Alcohol use: No   Drug use: No   Sexual activity: Not on file

## 2023-12-01 ENCOUNTER — Ambulatory Visit: Payer: BC Managed Care – PPO | Admitting: Physician Assistant

## 2023-12-09 ENCOUNTER — Other Ambulatory Visit (INDEPENDENT_AMBULATORY_CARE_PROVIDER_SITE_OTHER): Payer: Worker's Compensation

## 2023-12-09 ENCOUNTER — Ambulatory Visit: Payer: Worker's Compensation | Admitting: Orthopedic Surgery

## 2023-12-09 DIAGNOSIS — Z8781 Personal history of (healed) traumatic fracture: Secondary | ICD-10-CM

## 2023-12-09 DIAGNOSIS — Z9889 Other specified postprocedural states: Secondary | ICD-10-CM | POA: Diagnosis not present

## 2023-12-09 DIAGNOSIS — S99192A Other physeal fracture of left metatarsal, initial encounter for closed fracture: Secondary | ICD-10-CM

## 2023-12-12 ENCOUNTER — Encounter: Payer: Self-pay | Admitting: Orthopedic Surgery

## 2023-12-12 NOTE — Progress Notes (Signed)
Office Visit Note   Patient: Jack Wallace           Date of Birth: 05/22/1961           MRN: [id number] Visit Date: 12/09/2023              Requested by: Kathleen Lime, MD [address] [place],  [place] [address] PCP: Kathleen Lime, MD  Chief Complaint  Patient presents with   Left Foot - Routine Post Op    11/12/23 ORIF Left 5th MT fracture      HPI: Patient is a 62 year old gentleman who is 4 weeks status post open reduction internal fixation base of the fifth metatarsal fracture left foot.  Assessment & Plan: Visit Diagnoses:  1. S/P ORIF (open reduction internal fixation) fracture   2. Closed fracture of base of fifth metatarsal bone of left foot at metaphyseal-diaphyseal junction, initial encounter     Plan: Orders provided for a bone stimulator.  Patient has a kneeling scooter that was arranged by his caseworker.  Recommended minimize weightbearing.   three-view radiographs of the left foot at follow-up.    Continue out of work for 4 weeks.  Follow-Up Instructions: Return in about 4 weeks (around 01/06/2024).   Ortho Exam  Patient is alert, oriented, no adenopathy, well-dressed, normal affect, normal respiratory effort. Examination patient is ambulating with 1 crutch and a fracture boot.  There is no redness no cellulitis no signs of infection.  Imaging: No results found. No images are attached to the encounter.  Labs: Lab Results  Component Value Date   HGBA1C 8.3 (H) 10/15/2023   HGBA1C 7.9 (A) 10/06/2023   HGBA1C 7.6 (H) 07/05/2023   REPTSTATUS 05/14/2014 FINAL 05/07/2014   CULT  05/07/2014    NO GROWTH 5 DAYS Performed at [facility]     Lab Results  Component Value Date   ALBUMIN 4.6 10/15/2023   ALBUMIN 4.7 01/13/2023   ALBUMIN 4.9 01/22/2022    Lab Results  Component Value Date   MG 1.5 (L) 12/23/2022   MG 1.8 09/01/2022   MG 1.3 (L) 08/26/2022   No results found for: "VD25OH"  No results found for: "PREALBUMIN"     Latest Ref Rng & Units 11/12/2023    6:46 AM 09/16/2023   10:59 AM 08/26/2022    4:14 AM  CBC EXTENDED  WBC 4.0 - 10.5 K/uL 6.5  8.5  9.0   RBC 4.22 - 5.81 MIL/uL 5.10  5.73  5.16   Hemoglobin 13.0 - 17.0 g/dL 29.5  62.1  30.8   HCT 39.0 - 52.0 % 45.4  51.5  44.1   Platelets 150 - 400 K/uL 220  260  280      There is no height or weight on file to calculate BMI.  Orders:  Orders Placed This Encounter  Procedures   XR Foot Complete Left   No orders of the defined types were placed in this encounter.    Procedures: No procedures performed  Clinical Data: No additional findings.  ROS:  All other systems negative, except as noted in the HPI. Review of Systems  Objective: Vital Signs: There were no vitals taken for this visit.  Specialty Comments:  No specialty comments available.  PMFS History: Patient Active Problem List   Diagnosis Date Noted   Exertional dyspnea 10/19/2023   Tachycardia 10/18/2023   Stress fracture of metatarsal bone of left foot 05/24/2023   AKI (acute kidney injury) (HCC) 09/02/2022  Bilateral leg pain 07/03/2022   Hyperkalemia 01/07/2022   Pain of joint of left ankle and foot 04/01/2018   MDD (major depressive disorder) 11/13/2015   GERD (gastroesophageal reflux disease) 08/20/2015   Chronic nausea 03/29/2015   Preventative health care 08/23/2014   Diabetic neuropathy (HCC) 08/01/2013   Hypertension associated with diabetes (HCC) 07/31/2013   Hyperlipidemia 07/31/2013   Type 2 diabetes mellitus with peripheral neuropathy (HCC) 07/31/1992   Past Medical History:  Diagnosis Date   Depression    Diabetes mellitus without complication (HCC)    GERD (gastroesophageal reflux disease)    Hyperlipidemia    Hypertension    Ischemic colitis (HCC)    Tachycardia     Family History  Problem Relation Age of Onset   Hypertension Mother    Stroke Father    Diabetes Maternal Grandfather    Colon cancer Neg Hx    Throat cancer Neg Hx     Prostate cancer Neg Hx    Pancreatic cancer Neg Hx    Heart disease Neg Hx    Kidney disease Neg Hx    Liver disease Neg Hx     Past Surgical History:  Procedure Laterality Date   COLONOSCOPY  06/01/2014   NASAL SEPTUM SURGERY     ORIF TOE FRACTURE Left 11/12/2023   Procedure: OPEN REDUCTION INTERNAL FIXATION (ORIF) 5th METATARSAL (TOE) FRACTURE LEFT FOOT;  Surgeon: Nadara Mustard, MD;  Location: [facility] OR;  Service: Orthopedics;  Laterality: Left;   Social History   Occupational History   Occupation: Midwife  Tobacco Use   Smoking status: Never   Smokeless tobacco: Never  Vaping Use   Vaping status: Never Used  Substance and Sexual Activity   Alcohol use: No   Drug use: No   Sexual activity: Not on file

## 2024-01-03 ENCOUNTER — Other Ambulatory Visit: Payer: Self-pay

## 2024-01-03 ENCOUNTER — Other Ambulatory Visit: Payer: Self-pay | Admitting: Internal Medicine

## 2024-01-03 DIAGNOSIS — K2100 Gastro-esophageal reflux disease with esophagitis, without bleeding: Secondary | ICD-10-CM

## 2024-01-03 MED ORDER — PANTOPRAZOLE SODIUM 40 MG PO TBEC: DELAYED_RELEASE_TABLET | ORAL | 0 refills | Status: DC

## 2024-01-04 NOTE — Telephone Encounter (Signed)
 Patient requesting a 90 day supply.  Medication sent to pharmacy.

## 2024-01-06 ENCOUNTER — Ambulatory Visit (INDEPENDENT_AMBULATORY_CARE_PROVIDER_SITE_OTHER): Payer: Worker's Compensation | Admitting: Orthopedic Surgery

## 2024-01-06 ENCOUNTER — Other Ambulatory Visit (INDEPENDENT_AMBULATORY_CARE_PROVIDER_SITE_OTHER): Payer: BC Managed Care – PPO

## 2024-01-06 ENCOUNTER — Encounter: Payer: Self-pay | Admitting: Orthopedic Surgery

## 2024-01-06 DIAGNOSIS — S99192A Other physeal fracture of left metatarsal, initial encounter for closed fracture: Secondary | ICD-10-CM

## 2024-01-06 DIAGNOSIS — Z8781 Personal history of (healed) traumatic fracture: Secondary | ICD-10-CM

## 2024-01-06 DIAGNOSIS — Z9889 Other specified postprocedural states: Secondary | ICD-10-CM

## 2024-01-06 NOTE — Progress Notes (Signed)
Office Visit Note   Patient: Jack Wallace           Date of Birth: 26-Sep-1961           MRN: [id number] Visit Date: 01/06/2024              Requested by: Kathleen Lime, MD [address] [place],  [place] [address] PCP: Kathleen Lime, MD  Chief Complaint  Patient presents with   Left Foot - Routine Post Op    11/12/2023 ORIF left 5th MT fx       HPI: Patient is 2 months status post open duction internal fixation fifth metatarsal fracture he is currently on a kneeling scooter and just started using a bone stimulator.  Patient is on Neurontin.  Assessment & Plan: Visit Diagnoses:  1. S/P ORIF (open reduction internal fixation) fracture   2. Closed fracture of base of fifth metatarsal bone of left foot at metaphyseal-diaphyseal junction, initial encounter     Plan: Patient may advance to full weightbearing in the fracture boot continue with the bone stimulator.  Patient is provided a note that he may proceed with seated work.  Follow-Up Instructions: No follow-ups on file.   Ortho Exam  Patient is alert, oriented, no adenopathy, well-dressed, normal affect, normal respiratory effort. Examination the fracture site is tender to palpation he has good pulses the incision is well-healed.  There is no cellulitis swelling or drainage.  Blood glucose is unknown.  Most recent hemoglobin A1c is 8.3.  Imaging: No results found. No images are attached to the encounter.  Labs: Lab Results  Component Value Date   HGBA1C 8.3 (H) 10/15/2023   HGBA1C 7.9 (A) 10/06/2023   HGBA1C 7.6 (H) 07/05/2023   REPTSTATUS 05/14/2014 FINAL 05/07/2014   CULT  05/07/2014    NO GROWTH 5 DAYS Performed at [facility]     Lab Results  Component Value Date   ALBUMIN 4.6 10/15/2023   ALBUMIN 4.7 01/13/2023   ALBUMIN 4.9 01/22/2022    Lab Results  Component Value Date   MG 1.5 (L) 12/23/2022   MG 1.8 09/01/2022   MG 1.3 (L) 08/26/2022   No results found for: "VD25OH"  No results  found for: "PREALBUMIN"    Latest Ref Rng & Units 11/12/2023    6:46 AM 09/16/2023   10:59 AM 08/26/2022    4:14 AM  CBC EXTENDED  WBC 4.0 - 10.5 K/uL 6.5  8.5  9.0   RBC 4.22 - 5.81 MIL/uL 5.10  5.73  5.16   Hemoglobin 13.0 - 17.0 g/dL 78.2  95.6  21.3   HCT 39.0 - 52.0 % 45.4  51.5  44.1   Platelets 150 - 400 K/uL 220  260  280      There is no height or weight on file to calculate BMI.  Orders:  Orders Placed This Encounter  Procedures   XR Foot Complete Left   No orders of the defined types were placed in this encounter.    Procedures: No procedures performed  Clinical Data: No additional findings.  ROS:  All other systems negative, except as noted in the HPI. Review of Systems  Objective: Vital Signs: There were no vitals taken for this visit.  Specialty Comments:  No specialty comments available.  PMFS History: Patient Active Problem List   Diagnosis Date Noted   Exertional dyspnea 10/19/2023   Tachycardia 10/18/2023   Stress fracture of metatarsal bone of left foot 05/24/2023   AKI (  acute kidney injury) (HCC) 09/02/2022   Bilateral leg pain 07/03/2022   Hyperkalemia 01/07/2022   Pain of joint of left ankle and foot 04/01/2018   MDD (major depressive disorder) 11/13/2015   GERD (gastroesophageal reflux disease) 08/20/2015   Chronic nausea 03/29/2015   Preventative health care 08/23/2014   Diabetic neuropathy (HCC) 08/01/2013   Hypertension associated with diabetes (HCC) 07/31/2013   Hyperlipidemia 07/31/2013   Type 2 diabetes mellitus with peripheral neuropathy (HCC) 07/31/1992   Past Medical History:  Diagnosis Date   Depression    Diabetes mellitus without complication (HCC)    GERD (gastroesophageal reflux disease)    Hyperlipidemia    Hypertension    Ischemic colitis (HCC)    Tachycardia     Family History  Problem Relation Age of Onset   Hypertension Mother    Stroke Father    Diabetes Maternal Grandfather    Colon cancer Neg Hx     Throat cancer Neg Hx    Prostate cancer Neg Hx    Pancreatic cancer Neg Hx    Heart disease Neg Hx    Kidney disease Neg Hx    Liver disease Neg Hx     Past Surgical History:  Procedure Laterality Date   COLONOSCOPY  06/01/2014   NASAL SEPTUM SURGERY     ORIF TOE FRACTURE Left 11/12/2023   Procedure: OPEN REDUCTION INTERNAL FIXATION (ORIF) 5th METATARSAL (TOE) FRACTURE LEFT FOOT;  Surgeon: Nadara Mustard, MD;  Location: [facility] OR;  Service: Orthopedics;  Laterality: Left;   Social History   Occupational History   Occupation: Midwife  Tobacco Use   Smoking status: Never   Smokeless tobacco: Never  Vaping Use   Vaping status: Never Used  Substance and Sexual Activity   Alcohol use: No   Drug use: No   Sexual activity: Not on file

## 2024-01-13 ENCOUNTER — Encounter: Payer: Self-pay | Admitting: Cardiology

## 2024-01-13 ENCOUNTER — Telehealth: Payer: Self-pay | Admitting: *Deleted

## 2024-01-13 ENCOUNTER — Ambulatory Visit: Payer: BC Managed Care – PPO | Attending: Cardiology | Admitting: Cardiology

## 2024-01-13 ENCOUNTER — Ambulatory Visit: Payer: BC Managed Care – PPO

## 2024-01-13 VITALS — BP 143/97 | HR 128 | Resp 16 | Ht 72.0 in | Wt 323.2 lb

## 2024-01-13 DIAGNOSIS — R0609 Other forms of dyspnea: Secondary | ICD-10-CM | POA: Diagnosis not present

## 2024-01-13 DIAGNOSIS — R000 Tachycardia, unspecified: Secondary | ICD-10-CM | POA: Diagnosis not present

## 2024-01-13 DIAGNOSIS — E782 Mixed hyperlipidemia: Secondary | ICD-10-CM | POA: Diagnosis not present

## 2024-01-13 MED ORDER — ROSUVASTATIN CALCIUM 20 MG PO TABS: 20.0000 mg | ORAL_TABLET | Freq: Every day | ORAL | 3 refills | Status: DC

## 2024-01-13 MED ORDER — METOPROLOL TARTRATE 25 MG PO TABS: 25.0000 mg | ORAL_TABLET | Freq: Two times a day (BID) | ORAL | 3 refills | Status: DC

## 2024-01-13 NOTE — Progress Notes (Unsigned)
Enrolled for [employer] to mail a ZIO XT long term holter monitor to the patients address on file.  

## 2024-01-13 NOTE — Telephone Encounter (Signed)
RE: 2 WEEK ZIO PER DR. PATWARDHAN Received: Today Anner Crete, Pleas Patricia, LPN done       Previous Messages    ----- Message ----- From: Loa Socks, LPN Sent: 1/61/0960  11:14 AM EST To: Ernst Bowler; Katrina Claria Dice Subject: 2 WEEK ZIO PER DR. PATWARDHAN                  2 week zio for tachycardia  Please enroll and let me know when you do?  Thanks [employer]

## 2024-01-13 NOTE — Patient Instructions (Signed)
Medication Instructions:   START TAKING METOPROLOL TARTRATE 25 MG BY MOUTH TWICE DAILY  INCREASE YOUR ROSUVASTATIN (CRESTOR) TO 20 MG BY MOUTH DAILY  *If you need a refill on your cardiac medications before your next appointment, please call your pharmacy*   Lab Work:  IN 3 MONTHS AT [facility] FIRST FLOOR--LIPIDS--PLEASE COME FASTING TO THIS LAB APPOINTMENT  If you have labs (blood work) drawn today and your tests are completely normal, you will receive your results only by: MyChart Message (if you have MyChart) OR A paper copy in the mail If you have any lab test that is abnormal or we need to change your treatment, we will call you to review the results.    Testing/Procedures:  Christena Deem- Long Term Monitor Instructions  Your physician has requested you wear a ZIO patch monitor for 14 days.  This is a single patch monitor. [employer] supplies one patch monitor per enrollment. Additional stickers are not available. Please do not apply patch if you will be having a Nuclear Stress Test,  Echocardiogram, Cardiac CT, MRI, or Chest Xray during the period you would be wearing the  monitor. The patch cannot be worn during these tests. You cannot remove and re-apply the  ZIO XT patch monitor.  Your ZIO patch monitor will be mailed 3 day USPS to your address on file. It may take 3-5 days  to receive your monitor after you have been enrolled.  Once you have received your monitor, please review the enclosed instructions. Your monitor  has already been registered assigning a specific monitor serial # to you.  Billing and Patient Assistance Program Information  We have supplied [employer] with any of your insurance information on file for billing purposes. [employer] offers a sliding scale Patient Assistance Program for patients that do not have  insurance, or whose insurance does not completely cover the cost of the ZIO monitor.  You must apply for the Patient Assistance Program to qualify  for this discounted rate.  To apply, please call [employer] at [phone], select option 4, select option 2, ask to apply for  Patient Assistance Program. Meredeth Ide will ask your household income, and how many people  are in your household. They will quote your out-of-pocket cost based on that information.  [employer] will also be able to set up a 20-month, interest-free payment plan if needed.  Applying the monitor   Shave hair from upper left chest.  Hold abrader disc by orange tab. Rub abrader in 40 strokes over the upper left chest as  indicated in your monitor instructions.  Clean area with 4 enclosed alcohol pads. Let dry.  Apply patch as indicated in monitor instructions. Patch will be placed under collarbone on left  side of chest with arrow pointing upward.  Rub patch adhesive wings for 2 minutes. Remove white label marked "1". Remove the white  label marked "2". Rub patch adhesive wings for 2 additional minutes.  While looking in a mirror, press and release Cantu in center of patch. A small green light will  flash 3-4 times. This will be your only indicator that the monitor has been turned on.  Do not shower for the first 24 hours. You may shower after the first 24 hours.  Press the [place] if you feel a symptom. You will hear a small click. Record Date, Time and  Symptom in the Patient Logbook.  When you are ready to remove the patch, follow instructions on the last 2 pages of Patient  Logbook. Stick patch monitor onto the last page of Patient Logbook.  Place Patient Logbook in the blue and white box. Use locking tab on box and tape box closed  securely. The blue and white box has prepaid postage on it. Please place it in the mailbox as  soon as possible. Your physician should have your test results approximately 7 days after the  monitor has been mailed back to [facility].  Call [facility] Customer Care at [phone] if you have questions regarding  your ZIO XT patch  monitor. Call them immediately if you see an orange light blinking on your  monitor.  If your monitor falls off in less than 4 days, contact our Monitor department at [phone].  If your monitor becomes loose or falls off after 4 days call [employer] at [phone] for  suggestions on securing your monitor    Follow-Up:  IN MAY 2025 WITH DR. PATWARDHAN

## 2024-01-13 NOTE — Progress Notes (Signed)
Cardiology Office Note:  .   Date:  01/13/2024  ID:  Jack Wallace, DOB 07-10-1961, MRN [id number] PCP: Kathleen Lime, MD  [facility] Providers Cardiologist:  Truett Mainland, MD PCP: Kathleen Lime, MD  Chief Complaint  Patient presents with   Follow-up    6 week      History of Present Illness: .    Jack Wallace is a 63 y.o. male with hypertension, uncontrolled type 2 diabetes mellitus, obesity, persistent sinus tachycardia.   Since his last visit with me, he underwent echocardiogram and CTA that did not show any significant abnormality.  He underwent ORIF of left foot 5th metatarsal by Dr. Lajoyce Corners.  He saw Dr. Lajoyce Corners recently, was told that he was healing well.  However, patient continues to have difficulty walking.  Also has continued tachycardia and dyspnea on exertion.  As of this month, he has had a fracture for 5 months, associated with significant deconditioning.  Vitals:   01/13/24 1048  BP: (!) 143/97  Pulse: (!) 128  Resp: 16  SpO2: 96%     ROS:  Review of Systems  Cardiovascular:  Positive for dyspnea on exertion. Negative for chest pain, leg swelling, palpitations and syncope.     Studies Reviewed: Marland Kitchen         Independently interpreted 10/2023: Chol 169, TG 164, HDL 49, LDL 86 HbA1C 8.4% Hb 14.4 Cr 1.1, Gl 203   Echocardiogram 09/2023:  1. Left ventricular ejection fraction, by estimation, is 55 to 60%. The  left ventricle has normal function. Left ventricular endocardial border  not optimally defined to evaluate regional wall motion. Left ventricular  diastolic parameters are consistent  with Grade I diastolic dysfunction (impaired relaxation).   2. Right ventricular systolic function is normal. The right ventricular  size is normal. Tricuspid regurgitation signal is inadequate for assessing  PA pressure.   3. The mitral valve is normal in structure. No evidence of mitral valve  regurgitation. No evidence of mitral stenosis.   4. The  aortic valve is normal in structure. Aortic valve regurgitation is  not visualized. No aortic stenosis is present.   5. There is right bowing of the interatrial septum, suggestive of  elevated left atrial pressure. No atrial level shunt detected by color  flow Doppler.   CTA chest 120/20/2024: 1. No evidence of pulmonary embolus. 2. No acute intrathoracic process.      Physical Exam:   Physical Exam Vitals and nursing note reviewed.  Constitutional:      General: He is not in acute distress.    Appearance: He is obese.  Neck:     Vascular: No JVD.  Cardiovascular:     Rate and Rhythm: Regular rhythm. Tachycardia present.     Heart sounds: Normal heart sounds. No murmur heard. Pulmonary:     Effort: Pulmonary effort is normal.     Breath sounds: Normal breath sounds. No wheezing or rales.  Musculoskeletal:     Right lower leg: No edema.     Left lower leg: No edema.     Comments: Orthopedic boot in place in left      VISIT DIAGNOSES:   ICD-10-CM   1. Sinus tachycardia  R00.0 metoprolol tartrate (LOPRESSOR) 25 MG tablet    LONG TERM MONITOR (3-14 DAYS)    2. Mixed hyperlipidemia  E78.2 rosuvastatin (CRESTOR) 20 MG tablet    Lipid Profile    Lipid Profile    3. Exertional dyspnea  R06.09  ASSESSMENT AND PLAN: .    Jack Wallace is a 63 y.o. male with hypertension, uncontrolled type 2 diabetes mellitus, obesity, persistent sinus tachycardia.    Tachycardia: Resting sinus tachycardia with unclear etiology. TSH normal in 08/2023. Physical exam not consistent with decompensated heart failure. CTA negative for PE, no significant pulmonary pathology on CTA, no significant abnormality on echocardiogram either  (09/2023 ). I do think his continued sinus tachycardia as well as exertional dyspnea is likely due to deconditioning, as well as obesity. Check 2-week ZIO monitor to rule out any other arrhythmia. Started metoprolol tartrate 25 mg twice daily. Increase  activity as tolerated, and allowed by Dr. Lajoyce Corners.    Uncontrolled type 2 diabetes mellitus: A1c 8.4%.  Follow-up closely with PCP/endocrinology. Recommend increasing Crestor to 20 mg daily with goal LDL <70. Check lipid panel in 3 months.       Meds ordered this encounter  Medications   metoprolol tartrate (LOPRESSOR) 25 MG tablet    Sig: Take 1 tablet (25 mg total) by mouth 2 (two) times daily.    Dispense:  180 tablet    Refill:  3   rosuvastatin (CRESTOR) 20 MG tablet    Sig: Take 1 tablet (20 mg total) by mouth daily.    Dispense:  90 tablet    Refill:  3    Dose increase     F/u in 3 months  Signed, Elder Negus, MD

## 2024-01-14 ENCOUNTER — Encounter: Payer: Self-pay | Admitting: Orthopedic Surgery

## 2024-01-20 ENCOUNTER — Other Ambulatory Visit: Payer: Self-pay

## 2024-01-20 DIAGNOSIS — E1142 Type 2 diabetes mellitus with diabetic polyneuropathy: Secondary | ICD-10-CM

## 2024-01-20 DIAGNOSIS — E1165 Type 2 diabetes mellitus with hyperglycemia: Secondary | ICD-10-CM

## 2024-01-27 ENCOUNTER — Other Ambulatory Visit: Payer: BC Managed Care – PPO

## 2024-01-27 DIAGNOSIS — E1165 Type 2 diabetes mellitus with hyperglycemia: Secondary | ICD-10-CM | POA: Diagnosis not present

## 2024-01-27 DIAGNOSIS — Z794 Long term (current) use of insulin: Secondary | ICD-10-CM | POA: Diagnosis not present

## 2024-01-27 LAB — BASIC METABOLIC PANEL
BUN: 15 mg/dL (ref 7–25)
CO2: 25 mmol/L (ref 20–32)
Calcium: 9.3 mg/dL (ref 8.6–10.3)
Chloride: 102 mmol/L (ref 98–110)
Creat: 1.1 mg/dL (ref 0.70–1.35)
Glucose, Bld: 277 mg/dL — ABNORMAL HIGH (ref 65–99)
Potassium: 3.9 mmol/L (ref 3.5–5.3)
Sodium: 139 mmol/L (ref 135–146)

## 2024-01-28 ENCOUNTER — Encounter: Payer: Self-pay | Admitting: Endocrinology

## 2024-01-31 ENCOUNTER — Encounter: Payer: BC Managed Care – PPO | Admitting: Student

## 2024-02-02 ENCOUNTER — Other Ambulatory Visit (INDEPENDENT_AMBULATORY_CARE_PROVIDER_SITE_OTHER): Payer: Self-pay

## 2024-02-02 ENCOUNTER — Ambulatory Visit: Payer: BC Managed Care – PPO | Admitting: Endocrinology

## 2024-02-02 ENCOUNTER — Encounter: Payer: Self-pay | Admitting: Endocrinology

## 2024-02-02 VITALS — BP 138/82 | HR 100 | Resp 12 | Ht 72.0 in | Wt 329.8 lb

## 2024-02-02 DIAGNOSIS — E1142 Type 2 diabetes mellitus with diabetic polyneuropathy: Secondary | ICD-10-CM

## 2024-02-02 DIAGNOSIS — E1165 Type 2 diabetes mellitus with hyperglycemia: Secondary | ICD-10-CM

## 2024-02-02 DIAGNOSIS — Z794 Long term (current) use of insulin: Secondary | ICD-10-CM

## 2024-02-02 LAB — MICROALBUMIN / CREATININE URINE RATIO
Creatinine, Urine: 28 mg/dL (ref 20–320)
Microalb, Ur: <0.2 mg/dL

## 2024-02-02 LAB — POCT GLYCOSYLATED HEMOGLOBIN (HGB A1C): Hemoglobin A1C: 9.9 % — AB (ref 4.0–5.6)

## 2024-02-02 MED ORDER — NOVOLOG FLEXPEN 100 UNIT/ML ~~LOC~~ SOPN: 8.0000 [IU] | PEN_INJECTOR | Freq: Three times a day (TID) | SUBCUTANEOUS | 11 refills | Status: DC

## 2024-02-02 MED ORDER — INSULIN PEN NEEDLE 32G X 5 MM MISC
1.0000 | Freq: Every day | 3 refills | Status: AC
Start: 2024-02-02 — End: ?

## 2024-02-02 MED ORDER — LANCETS MISC. MISC: 1.0000 | Freq: Three times a day (TID) | 3 refills | Status: AC

## 2024-02-02 MED ORDER — METFORMIN HCL ER 500 MG PO TB24: 1500.0000 mg | ORAL_TABLET | Freq: Every day | ORAL | 3 refills | Status: AC

## 2024-02-02 MED ORDER — CONTOUR NEXT TEST VI STRP: ORAL_STRIP | 2 refills | Status: DC

## 2024-02-02 NOTE — Patient Instructions (Signed)
Diabetes regimen: Tresiba 30 units daily at bedtime Metformin extended release 1500 mg daily. Jardiance 25 mg daily.   Amaryl 2 mg daily.  Start novolog 8 units with meals 3 times a day, take 10-15 minutes before eating.

## 2024-02-02 NOTE — Progress Notes (Signed)
Outpatient Endocrinology Note [name], MD  02/02/24  Patient's Name: Jack Wallace    DOB: 1961-10-29    MRN: [id number]                                                    REASON OF VISIT: Follow up of type 2 diabetes mellitus  PCP: Kathleen Lime, MD  HISTORY OF PRESENT ILLNESS:   Jack Wallace is a 63 y.o. old male with past medical history listed below, is here for follow up for type 2 diabetes mellitus.  Patient was last seen by Dr. Lucianne Muss in July 2024.  Pertinent Diabetes History: Patient was diagnosed with type 2 diabetes mellitus in 1993.  Chronic Diabetes Complications : Retinopathy: no. Last ophthalmology exam was done on 04/2023, following with ophthalmology regularly.  Nephropathy: no, on ACE/ARB / losartan Peripheral neuropathy: yes, on gabapentin Coronary artery disease: no Stroke: no  Relevant comorbidities and cardiovascular risk factors: Obesity: yes Body mass index is 44.73 kg/m.  Hypertension: Yes  Hyperlipidemia : Yes, on statin   Current / Home Diabetic regimen includes: Tresiba 30 units daily at bedtime Metformin extended release 1500 mg daily. Jardiance 25 mg daily.   Amaryl 1 mg daily.  Prior diabetic medications: Actos had used in the past stopped due to fear of long-term side effects.  He had used glipizide, Ozempic and Victoza in the past.  Glycemic data:   Patient forgets to bring glucometer in the clinic today.  He reports he has been checking blood sugar mostly once a day and has been mostly in the 200 range.  CGM discussed in the past was not cost effective.  Hypoglycemia: Patient has no hypoglycemic episodes. Patient has hypoglycemia awareness.  Factors modifying glucose control: 1.  Diabetic diet assessment: 3 meals a day.  2.  Staying active or exercising: Currently no exercise, due to left foot fracture.  3.  Medication compliance: compliant all of the time.  Interval history  He is still with left foot orthopedic boot, he  had surgery for closed fracture of shaft of metatarsal bone of the left foot, wound is healed.  Hemoglobin A1c today worsening 9.9%.  Diabetes regimen as reviewed and noted above and he reports compliance with these medications.  He reports blood sugar is still in 200 range, no glucometer data to review.  He has limited physical activity due to foot problem.  He has been following with orthopedic.  He complains of numbness on the left foot.  No other complaints today.  REVIEW OF SYSTEMS As per history of present illness.   PAST MEDICAL HISTORY: Past Medical History:  Diagnosis Date   Depression    Diabetes mellitus without complication (HCC)    GERD (gastroesophageal reflux disease)    Hyperlipidemia    Hypertension    Ischemic colitis (HCC)    Tachycardia     PAST SURGICAL HISTORY: Past Surgical History:  Procedure Laterality Date   COLONOSCOPY  06/01/2014   NASAL SEPTUM SURGERY     ORIF TOE FRACTURE Left 11/12/2023   Procedure: OPEN REDUCTION INTERNAL FIXATION (ORIF) 5th METATARSAL (TOE) FRACTURE LEFT FOOT;  Surgeon: Nadara Mustard, MD;  Location: [facility] OR;  Service: Orthopedics;  Laterality: Left;    ALLERGIES: Allergies  Allergen Reactions   Mounjaro [Tirzepatide] Diarrhea and Nausea Only    FAMILY  HISTORY:  Family History  Problem Relation Age of Onset   Hypertension Mother    Stroke Father    Diabetes Maternal Grandfather    Colon cancer Neg Hx    Throat cancer Neg Hx    Prostate cancer Neg Hx    Pancreatic cancer Neg Hx    Heart disease Neg Hx    Kidney disease Neg Hx    Liver disease Neg Hx     SOCIAL HISTORY: Social History   Socioeconomic History   Marital status: Single    Spouse name: Not on file   Number of children: 0   Years of education: Not on file   Highest education level: Not on file  Occupational History   Occupation: Midwife  Tobacco Use   Smoking status: Never   Smokeless tobacco: Never  Vaping Use   Vaping status: Never Used   Substance and Sexual Activity   Alcohol use: No   Drug use: No   Sexual activity: Not on file  Other Topics Concern   Not on file  Social History Narrative   Not on file   Social Drivers of Health   Financial Resource Strain: Not on file  Food Insecurity: Not on file  Transportation Needs: No Transportation Needs (08/27/2022)   PRAPARE - Administrator, Civil Service (Medical): No    Lack of Transportation (Non-Medical): No  Physical Activity: Not on file  Stress: Not on file  Social Connections: Not on file    MEDICATIONS:  Current Outpatient Medications  Medication Sig Dispense Refill   amLODipine (NORVASC) 5 MG tablet TAKE 1 TABLET(5 MG) BY MOUTH DAILY 30 tablet 11   BAYER MICROLET LANCETS lancets Use to check blood sugar two times per day 100 each 2   Blood Glucose Monitoring Suppl (CONTOUR NEXT EZ MONITOR) w/Device KIT Use to check blood sugar two times per day 1 kit 2   empagliflozin (JARDIANCE) 25 MG TABS tablet Take 1 tablet (25 mg total) by mouth daily. 90 tablet 3   gabapentin (NEURONTIN) 600 MG tablet TAKE 1 TABLET(600 MG) BY MOUTH THREE TIMES DAILY (Patient taking differently: Take 600-1,200 mg by mouth See admin instructions. TAKE 600 mg in the morning and 1200 mg at bedtime) 270 tablet 1   glimepiride (AMARYL) 1 MG tablet Take 2 tablet daily. (Patient taking differently: Take 1 mg by mouth 2 (two) times daily.) 180 tablet 3   insulin aspart (NOVOLOG FLEXPEN) 100 UNIT/ML FlexPen Inject 8 Units into the skin 3 (three) times daily with meals. 15 mL 11   insulin degludec (TRESIBA FLEXTOUCH) 100 UNIT/ML FlexTouch Pen Inject 30 Units into the skin daily. 30 mL 3   Lancets Misc. MISC 1 each by Does not apply route in the morning, at noon, and at bedtime. May substitute to any manufacturer covered by patient's insurance. 300 each 3   losartan (COZAAR) 50 MG tablet TAKE 1 TABLET(50 MG) BY MOUTH DAILY 30 tablet 11   Magnesium Oxide 400 MG CAPS Take 1 capsule (400 mg  total) by mouth daily. (Patient taking differently: Take 250 mg by mouth 2 (two) times daily.) 30 capsule 0   metoprolol tartrate (LOPRESSOR) 25 MG tablet Take 1 tablet (25 mg total) by mouth 2 (two) times daily. 180 tablet 3   Multiple Vitamin (MULTIVITAMIN WITH MINERALS) TABS tablet Take 1 tablet by mouth daily.     pantoprazole (PROTONIX) 40 MG tablet TAKE 1 TABLET(40 MG) BY MOUTH TWICE DAILY 180 tablet 3  rosuvastatin (CRESTOR) 20 MG tablet Take 1 tablet (20 mg total) by mouth daily. 90 tablet 3   sertraline (ZOLOFT) 100 MG tablet Take 2 tablets (200 mg total) by mouth daily. 60 tablet 11   vitamin B-12 (CYANOCOBALAMIN) 500 MCG tablet Take 500 mcg by mouth daily.     glucose blood (CONTOUR NEXT TEST) test strip TEST 3-4 timesDAILY 300 strip 2   Insulin Pen Needle 32G X 5 MM MISC 1 Needle by Does not apply route daily. Use 4 x  with Insulin Pen 300 each 3   metFORMIN (GLUCOPHAGE-XR) 500 MG 24 hr tablet Take 3 tablets (1,500 mg total) by mouth daily with breakfast. 270 tablet 3   No current facility-administered medications for this visit.    PHYSICAL EXAM: Vitals:   02/02/24 0812  BP: 138/82  Pulse: 100  Resp: 12  SpO2: 95%  Weight: (!) 329 lb 12.8 oz (149.6 kg)  Height: 6' (1.829 m)    Body mass index is 44.73 kg/m.  Wt Readings from Last 3 Encounters:  02/02/24 (!) 329 lb 12.8 oz (149.6 kg)  01/13/24 (!) 323 lb 3.2 oz (146.6 kg)  11/12/23 (!) 325 lb (147.4 kg)    General: Well developed, well nourished male in no apparent distress.  HEENT: AT/[place], no external lesions.  Eyes: Conjunctiva clear and no icterus. Neck: Neck supple  Lungs: Respirations not labored Neurologic: Alert, oriented, normal speech Extremities / Skin: Dry.  Left foot with orthopedic boot. Psychiatric: Does not appear depressed or anxious  Diabetic Foot Exam - Simple   Simple Foot Form Diabetic Foot exam was performed with the following findings: Yes 02/02/2024  8:35 AM  Visual Inspection See  comments: Yes Sensation Testing See comments: Yes Pulse Check See comments: Yes Comments DP palpable bilaterally. No ulcer.  Left foot later scar +, healed well. Monofilament exam intact on right foot and diminished on left heel and central area.    LABS Reviewed Lab Results  Component Value Date   HGBA1C 9.9 (A) 02/02/2024   HGBA1C 8.3 (H) 10/15/2023   HGBA1C 7.9 (A) 10/06/2023   Lab Results  Component Value Date   FRUCTOSAMINE 293 (H) 11/16/2022   FRUCTOSAMINE 315 (H) 03/17/2022   FRUCTOSAMINE 294 (H) 02/24/2018   Lab Results  Component Value Date   CHOL 169 10/15/2023   HDL 49.80 10/15/2023   LDLCALC 86 10/15/2023   LDLDIRECT 85.0 07/30/2022   TRIG 164.0 (H) 10/15/2023   CHOLHDL 3 10/15/2023   Lab Results  Component Value Date   MICRALBCREAT 0.8 01/22/2022   MICRALBCREAT 0.7 07/22/2021   Lab Results  Component Value Date   CREATININE 1.10 01/27/2024   Lab Results  Component Value Date   GFR 67.93 10/15/2023    ASSESSMENT / PLAN  1. Uncontrolled type 2 diabetes mellitus with hyperglycemia, with long-term current use of insulin (HCC)   2. Diabetic peripheral neuropathy associated with type 2 diabetes mellitus (HCC)   3. Uncontrolled type 2 diabetes mellitus with hyperglycemia (HCC)      Diabetes Mellitus type 2, complicated by diabetic neuropathy. - Diabetic status / severity: Uncontrolled, worsening.  Lab Results  Component Value Date   HGBA1C 9.9 (A) 02/02/2024    - Hemoglobin A1c goal : <7%  Patient has worsening hyperglycemia.  Planning for mealtime insulin, adjusted diabetes regimen as follows.    - Medications: See below.  I) continue Tresiba 30 units daily at bedtime. II) continue metformin extended release 1500 mg daily. III) continue Jardiance 25 mg  daily. IV) continue Amaryl 2 mg daily. V) Start novolog 8 units with meals 3 times a day, take 10-15 minutes before eating.   Patient is asked to call our clinic if his blood sugar  remains more than 150 persistently will adjust the diabetes regimen in between the visit over the phone.  - Home glucose testing: In the morning daily fasting and at bedtime. - Discussed/ Gave Hypoglycemia treatment plan.  # Consult : not required at this time.   # Annual urine for microalbuminuria/ creatinine ratio, no microalbuminuria currently, continue ACE/ARB losartan.  Will check today. Last  Lab Results  Component Value Date   MICRALBCREAT 0.8 01/22/2022    # Foot check nightly / neuropathy, continue gabapentin.  # Annual dilated diabetic eye exams.   - Diet: Make healthy diabetic food choices - Life style / activity / exercise: Discussed.  Not able to do exercise due to left foot fracture.  2. Blood pressure  -  BP Readings from Last 1 Encounters:  02/02/24 138/82    - Control is in target.  - No change in current plans.  Follow-up with primary care provider.  Blood pressure is mildly elevated today.  3. Lipid status / Hyperlipidemia - Last  Lab Results  Component Value Date   LDLCALC 86 10/15/2023   - Continue rosuvastatin 20 mg daily.  Managed by cardiology.  Diagnoses and all orders for this visit:  Uncontrolled type 2 diabetes mellitus with hyperglycemia, with long-term current use of insulin (HCC) -     insulin aspart (NOVOLOG FLEXPEN) 100 UNIT/ML FlexPen; Inject 8 Units into the skin 3 (three) times daily with meals. -     Insulin Pen Needle 32G X 5 MM MISC; 1 Needle by Does not apply route daily. Use 4 x  with Insulin Pen -     Lancets Misc. MISC; 1 each by Does not apply route in the morning, at noon, and at bedtime. May substitute to any manufacturer covered by patient's insurance. -     Microalbumin / creatinine urine ratio  Diabetic peripheral neuropathy associated with type 2 diabetes mellitus (HCC) -     glucose blood (CONTOUR NEXT TEST) test strip; TEST 3-4 timesDAILY  Uncontrolled type 2 diabetes mellitus with hyperglycemia (HCC) -     metFORMIN  (GLUCOPHAGE-XR) 500 MG 24 hr tablet; Take 3 tablets (1,500 mg total) by mouth daily with breakfast.     DISPOSITION Follow up in clinic in 3 months suggested.   All questions answered and patient verbalized understanding of the plan.  [place] Demiya Magno, MD [facility] Endocrinology [facility] [address] [place], [address] [place], [place] [address] Phone # [phone]  At least part of this note was generated using voice recognition software. Inadvertent word errors may have occurred, which were not recognized during the proofreading process.

## 2024-02-10 ENCOUNTER — Telehealth: Payer: Self-pay | Admitting: *Deleted

## 2024-02-10 ENCOUNTER — Encounter: Payer: BC Managed Care – PPO | Admitting: Orthopedic Surgery

## 2024-02-10 NOTE — Telephone Encounter (Signed)
 Patient was identified as falling into the [place] Measure - Diabetes.   Patient was: Appointment scheduled with primary care provider in the next 30 days.

## 2024-02-17 ENCOUNTER — Encounter: Payer: BC Managed Care – PPO | Admitting: Orthopedic Surgery

## 2024-02-21 ENCOUNTER — Ambulatory Visit (INDEPENDENT_AMBULATORY_CARE_PROVIDER_SITE_OTHER): Payer: Worker's Compensation | Admitting: Orthopedic Surgery

## 2024-02-21 DIAGNOSIS — Z9889 Other specified postprocedural states: Secondary | ICD-10-CM

## 2024-02-21 DIAGNOSIS — S99192A Other physeal fracture of left metatarsal, initial encounter for closed fracture: Secondary | ICD-10-CM

## 2024-02-21 DIAGNOSIS — Z8781 Personal history of (healed) traumatic fracture: Secondary | ICD-10-CM

## 2024-02-22 ENCOUNTER — Encounter: Payer: Self-pay | Admitting: Orthopedic Surgery

## 2024-02-22 NOTE — Progress Notes (Signed)
 Office Visit Note   Patient: Jack Wallace           Date of Birth: 03/19/1961           MRN: [id number] Visit Date: 02/21/2024              Requested by: Kathleen Lime, MD [address] [place],  [place] [address] PCP: Kathleen Lime, MD  Chief Complaint  Patient presents with   Left Foot - Follow-up    11/12/2023 ORIF left 5th MT fx      HPI: Patient is a 63 year old gentleman who is over 3 months status post open reduction internal fixation Jones fracture base of the fifth metatarsal left foot.  Patient states that with walking for activities of daily living his heart rate will increase to 130-170.  Patient is checking his pulse with a pulse oximeter.  Patient states he has no symptoms with his foot but he still has numbness on the dorsum of the foot.  Patient states that if he tries ambulate more than 15 feet he feels like he is going to pass out.  Patient states that prior to surgery he was bedridden for 5 months.  Assessment & Plan: Visit Diagnoses:  1. S/P ORIF (open reduction internal fixation) fracture   2. 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    However, patient is not released to return to work from his medical conditions.  Discussed with the patient he would need to be released to return to work by his medical doctor once patient's medical workup has been completed.  Patient states he has a follow-up appointment with his primary care physician this week.    Patient has reached maximal medical improvement from his metatarsal base fracture.  In review of the [place] industrial guidelines for permanent partial impairment patient's impairment rating would be 0% of the left foot.  Patient has no loss of range of motion, no  intra-articular fractures.  Follow-Up Instructions: Return if symptoms worsen or fail to improve.   Ortho Exam  Patient is alert, oriented, no adenopathy, well-dressed, normal affect, normal respiratory effort. Examination of the foot patient has no deformities no restrictions with range of motion.  Imaging: No results found. No images are attached to the encounter.  Labs: Lab Results  Component Value Date   HGBA1C 9.9 (A) 02/02/2024   HGBA1C 8.3 (H) 10/15/2023   HGBA1C 7.9 (A) 10/06/2023   REPTSTATUS 05/14/2014 FINAL 05/07/2014   CULT  05/07/2014    NO GROWTH 5 DAYS Performed at [facility]     Lab Results  Component Value Date   ALBUMIN 4.6 10/15/2023   ALBUMIN 4.7 01/13/2023   ALBUMIN 4.9 01/22/2022    Lab Results  Component Value Date   MG 1.5 (L) 12/23/2022   MG 1.8 09/01/2022   MG 1.3 (L) 08/26/2022   No results found for: "VD25OH"  No results found for: "PREALBUMIN"    Latest Ref Rng & Units 11/12/2023    6:46 AM 09/16/2023   10:59 AM 08/26/2022    4:14 AM  CBC EXTENDED  WBC 4.0 - 10.5 K/uL 6.5  8.5  9.0   RBC 4.22 - 5.81 MIL/uL 5.10  5.73  5.16   Hemoglobin 13.0 - 17.0 g/dL 14.4  16.5  13.2   HCT 39.0 - 52.0 % 45.4  51.5  44.1   Platelets 150 - 400 K/uL 220  260  280      There is no height or weight on file to calculate BMI.  Orders:  No orders of the defined types were placed in this encounter.  No orders of the defined types were placed in this encounter.    Procedures: No procedures performed  Clinical Data: No additional findings.  ROS:  All other systems negative, except as noted in the HPI. Review of Systems  Objective: Vital Signs: There were no vitals taken for this visit.  Specialty Comments:  No specialty comments available.  PMFS History: Patient Active Problem List   Diagnosis Date Noted   Exertional dyspnea 10/19/2023   Tachycardia 10/18/2023   Stress fracture of metatarsal bone of left foot 05/24/2023    AKI (acute kidney injury) (HCC) 09/02/2022   Bilateral leg pain 07/03/2022   Hyperkalemia 01/07/2022   Pain of joint of left ankle and foot 04/01/2018   MDD (major depressive disorder) 11/13/2015   GERD (gastroesophageal reflux disease) 08/20/2015   Chronic nausea 03/29/2015   Preventative health care 08/23/2014   Diabetic neuropathy (HCC) 08/01/2013   Hypertension associated with diabetes (HCC) 07/31/2013   Hyperlipidemia 07/31/2013   Type 2 diabetes mellitus with peripheral neuropathy (HCC) 07/31/1992   Past Medical History:  Diagnosis Date   Depression    Diabetes mellitus without complication (HCC)    GERD (gastroesophageal reflux disease)    Hyperlipidemia    Hypertension    Ischemic colitis (HCC)    Tachycardia     Family History  Problem Relation Age of Onset   Hypertension Mother    Stroke Father    Diabetes Maternal Grandfather    Colon cancer Neg Hx    Throat cancer Neg Hx    Prostate cancer Neg Hx    Pancreatic cancer Neg Hx    Heart disease Neg Hx    Kidney disease Neg Hx    Liver disease Neg Hx     Past Surgical History:  Procedure Laterality Date   COLONOSCOPY  06/01/2014   NASAL SEPTUM SURGERY     ORIF TOE FRACTURE Left 11/12/2023   Procedure: OPEN REDUCTION INTERNAL FIXATION (ORIF) 5th METATARSAL (TOE) FRACTURE LEFT FOOT;  Surgeon: Nadara Mustard, MD;  Location: [facility] OR;  Service: Orthopedics;  Laterality: Left;   Social History   Occupational History   Occupation: Midwife  Tobacco Use   Smoking status: Never   Smokeless tobacco: Never  Vaping Use   Vaping status: Never Used  Substance and Sexual Activity   Alcohol use: No   Drug use: No   Sexual activity: Not on file

## 2024-02-23 ENCOUNTER — Ambulatory Visit: Payer: BC Managed Care – PPO | Admitting: Student

## 2024-02-23 VITALS — BP 124/81 | HR 110 | Temp 97.9°F | Ht 72.0 in | Wt 325.5 lb

## 2024-02-23 DIAGNOSIS — R0609 Other forms of dyspnea: Secondary | ICD-10-CM | POA: Diagnosis not present

## 2024-02-23 DIAGNOSIS — E1159 Type 2 diabetes mellitus with other circulatory complications: Secondary | ICD-10-CM

## 2024-02-23 DIAGNOSIS — Z7984 Long term (current) use of oral hypoglycemic drugs: Secondary | ICD-10-CM

## 2024-02-23 DIAGNOSIS — I152 Hypertension secondary to endocrine disorders: Secondary | ICD-10-CM

## 2024-02-23 DIAGNOSIS — E1142 Type 2 diabetes mellitus with diabetic polyneuropathy: Secondary | ICD-10-CM

## 2024-02-23 DIAGNOSIS — Z794 Long term (current) use of insulin: Secondary | ICD-10-CM

## 2024-02-23 MED ORDER — TRULICITY 0.75 MG/0.5ML ~~LOC~~ SOAJ: 0.7500 mg | SUBCUTANEOUS | 0 refills | Status: DC

## 2024-02-23 MED ORDER — TRULICITY 1.5 MG/0.5ML ~~LOC~~ SOAJ: 1.5000 mg | SUBCUTANEOUS | 0 refills | Status: DC

## 2024-02-23 NOTE — Progress Notes (Signed)
SATURATION QUALIFICATIONS: (This note is used to comply with regulatory documentation for home oxygen)  Patient Saturations on Room Air at Rest = 96%  Patient Saturations on Room Air while Ambulating = 96%   

## 2024-02-23 NOTE — Patient Instructions (Addendum)
 Thank you, Jack Wallace for allowing [place] to provide your care today. Today we discussed your blood sugars and your shortness of breath when you walk.  It seems that cardiology has done a thorough workup and still could not figure out what was causing her shortness of breath.  Further assess was going on I would get a test called PFT to see if your lungs are functioning properly.  I also think that weight loss would be very beneficial to you at this time.  I am starting you on a diabetes medication called Trulicity which can help with your blood sugar and also help you lose weight.  Please be aware that you might have some abdominal symptoms with Trulicity.  Please inject 0.75 weekly for the next 4 weeks.  Inject 1.5 mg weekly AFTER  fifth week  Once the PFT results come back I will give you call to let you know what the plan moving forward is.  But for the meantime continue to watch her diet and incorporate exercise into her daily routine as tolerable.  I have ordered the following labs for you:  Lab Orders  No laboratory test(s) ordered today     Tests ordered today:    Referrals ordered today:   Referral Orders  No referral(s) requested today     I have ordered the following medication/changed the following medications:   Stop the following medications: There are no discontinued medications.   Start the following medications: Meds ordered this encounter  Medications   Dulaglutide (TRULICITY) 0.75 MG/0.5ML SOAJ    Sig: Inject 0.75 mg into the skin once a week.    Dispense:  2 mL    Refill:  0   Dulaglutide (TRULICITY) 1.5 MG/0.5ML SOAJ    Sig: Inject 1.5 mg into the skin once a week.    Dispense:  2 mL    Refill:  0     Follow up:  2 MONTH    Remember:   Should you have any questions or concerns please call the internal medicine clinic at [phone].    Kathleen Lime, M.D [facility]

## 2024-02-23 NOTE — Assessment & Plan Note (Signed)
 BP Readings from Last 3 Encounters:  02/23/24 124/81  02/02/24 138/82  01/13/24 (!) 143/97  BP at goal today . I agree with Dr Geraldo Pitter that his previous high blood pressure during his last office visit  was due to his pain. Patient is s/p foot repair and no longer  in pain.  - Continue amlodipine 5 mg daily and losartan 50 mg daily

## 2024-02-23 NOTE — Assessment & Plan Note (Signed)
 Jack Wallace presented to me in the clinic due to concerns for several months of shortness of breath on exertion. Patient reports he is not dyspneic when he is sitting idle watching TV.  He has no history of asthma, COPD and a non-smoker.  I am concerned for

## 2024-02-23 NOTE — Progress Notes (Signed)
 CC: Chronic condition follow-up  HPI:  Jack.Jack Wallace is a 63 y.o. male living with a history stated below and presents today for chronic conditions follow-up. Please see problem based assessment and plan for additional details.  Past Medical History:  Diagnosis Date   Depression    Diabetes mellitus without complication (HCC)    GERD (gastroesophageal reflux disease)    Hyperlipidemia    Hypertension    Ischemic colitis (HCC)    Tachycardia     Current Outpatient Medications on File Prior to Visit  Medication Sig Dispense Refill   amLODipine (NORVASC) 5 MG tablet TAKE 1 TABLET(5 MG) BY MOUTH DAILY 30 tablet 11   BAYER MICROLET LANCETS lancets Use to check blood sugar two times per day 100 each 2   Blood Glucose Monitoring Suppl (CONTOUR NEXT EZ MONITOR) w/Device KIT Use to check blood sugar two times per day 1 kit 2   empagliflozin (JARDIANCE) 25 MG TABS tablet Take 1 tablet (25 mg total) by mouth daily. 90 tablet 3   gabapentin (NEURONTIN) 600 MG tablet TAKE 1 TABLET(600 MG) BY MOUTH THREE TIMES DAILY (Patient taking differently: Take 600-1,200 mg by mouth See admin instructions. TAKE 600 mg in the morning and 1200 mg at bedtime) 270 tablet 1   glimepiride (AMARYL) 1 MG tablet Take 2 tablet daily. (Patient taking differently: Take 1 mg by mouth 2 (two) times daily.) 180 tablet 3   glucose blood (CONTOUR NEXT TEST) test strip TEST 3-4 timesDAILY 300 strip 2   insulin aspart (NOVOLOG FLEXPEN) 100 UNIT/ML FlexPen Inject 8 Units into the skin 3 (three) times daily with meals. 15 mL 11   insulin degludec (TRESIBA FLEXTOUCH) 100 UNIT/ML FlexTouch Pen Inject 30 Units into the skin daily. 30 mL 3   Insulin Pen Needle 32G X 5 MM MISC 1 Needle by Does not apply route daily. Use 4 x  with Insulin Pen 300 each 3   Lancets Misc. MISC 1 each by Does not apply route in the morning, at noon, and at bedtime. May substitute to any manufacturer covered by patient's insurance. 300 each 3   losartan  (COZAAR) 50 MG tablet TAKE 1 TABLET(50 MG) BY MOUTH DAILY 30 tablet 11   Magnesium Oxide 400 MG CAPS Take 1 capsule (400 mg total) by mouth daily. (Patient taking differently: Take 250 mg by mouth 2 (two) times daily.) 30 capsule 0   metFORMIN (GLUCOPHAGE-XR) 500 MG 24 hr tablet Take 3 tablets (1,500 mg total) by mouth daily with breakfast. 270 tablet 3   metoprolol tartrate (LOPRESSOR) 25 MG tablet Take 1 tablet (25 mg total) by mouth 2 (two) times daily. 180 tablet 3   Multiple Vitamin (MULTIVITAMIN WITH MINERALS) TABS tablet Take 1 tablet by mouth daily.     pantoprazole (PROTONIX) 40 MG tablet TAKE 1 TABLET(40 MG) BY MOUTH TWICE DAILY 180 tablet 3   rosuvastatin (CRESTOR) 20 MG tablet Take 1 tablet (20 mg total) by mouth daily. 90 tablet 3   sertraline (ZOLOFT) 100 MG tablet Take 2 tablets (200 mg total) by mouth daily. 60 tablet 11   vitamin B-12 (CYANOCOBALAMIN) 500 MCG tablet Take 500 mcg by mouth daily.     No current facility-administered medications on file prior to visit.    Family History  Problem Relation Age of Onset   Hypertension Mother    Stroke Father    Diabetes Maternal Grandfather    Colon cancer Neg Hx    Throat cancer Neg Hx  Prostate cancer Neg Hx    Pancreatic cancer Neg Hx    Heart disease Neg Hx    Kidney disease Neg Hx    Liver disease Neg Hx     Social History   Socioeconomic History   Marital status: Single    Spouse name: Not on file   Number of children: 0   Years of education: Not on file   Highest education level: Not on file  Occupational History   Occupation: Midwife  Tobacco Use   Smoking status: Never   Smokeless tobacco: Never  Vaping Use   Vaping status: Never Used  Substance and Sexual Activity   Alcohol use: No   Drug use: No   Sexual activity: Not on file  Other Topics Concern   Not on file  Social History Narrative   Not on file   Social Drivers of Health   Financial Resource Strain: Not on file  Food Insecurity:  Not on file  Transportation Needs: No Transportation Needs (08/27/2022)   PRAPARE - Administrator, Civil Service (Medical): No    Lack of Transportation (Non-Medical): No  Physical Activity: Not on file  Stress: Not on file  Social Connections: Not on file  Intimate Partner Violence: Not on file    Review of Systems: ROS negative except for what is noted on the assessment and plan.  Vitals:   02/23/24 1049  BP: 124/81  Pulse: (!) 110  Temp: 97.9 F (36.6 C)  TempSrc: Oral  SpO2: 96%  Weight: (!) 325 lb 8 oz (147.6 kg)  Height: 6' (1.829 m)    Physical Exam: Constitutional: Obese appearing man,sitting in chair ,NAD. Cardiovascular: regular rate and rhythm, no m/r/g Pulmonary/Chest: normal work of breathing on room air, lungs clear to auscultation bilaterally Abdominal: Pannus abdomen, non tender, no guarding  MSK: Knee brace on right knee Neurological: alert & oriented x 3, no focal deficit Skin: warm and dry Psych: normal mood and behavior  Assessment & Plan:   Exertional dyspnea Jack Wallace presented to me in the clinic due to concerns for several months of shortness of breath on exertion. Patient reports he is not dyspneic when he is sitting idle watching TV.  He has no history of asthma, COPD and a non-smoker.  I am concerned for   Type 2 diabetes mellitus with peripheral neuropathy [facility]) Lab Results  Component Value Date   HGBA1C 9.9 (A) 02/02/2024   HGBA1C 8.3 (H) 10/15/2023   HGBA1C 7.9 (A) 10/06/2023  Chronically stable. Will recheck A1c in 2 months. Follows with Dr Lucianne Muss of Endocrine. I think patient will also benefit from Trulicity with both glycemic control and weight loss.  Plan: Start Trulicity 0.75 weekly for 4 week and titrate up to 1.5 mg weekly starting week 5 Continue on Metformin ER 1500 daily Continue on Glimepiride 1 mg BID Continue on Jardiance 25 mg Continue on Tresiba 24 units daily   Hypertension associated with diabetes  (HCC) BP Readings from Last 3 Encounters:  02/23/24 124/81  02/02/24 138/82  01/13/24 (!) 143/97  BP at goal today . I agree with Dr Geraldo Pitter that his previous high blood pressure during his last office visit  was due to his pain. Patient is s/p foot repair and no longer  in pain.  - Continue amlodipine 5 mg daily and losartan 50 mg daily      Patient discussed with Dr. Cleda Daub    Kathleen Lime, M.D [facility] Phone:  [phone] Date 02/23/2024 Time 4:13 PM

## 2024-02-23 NOTE — Assessment & Plan Note (Addendum)
 Lab Results  Component Value Date   HGBA1C 9.9 (A) 02/02/2024   HGBA1C 8.3 (H) 10/15/2023   HGBA1C 7.9 (A) 10/06/2023  Chronically stable. Will recheck A1c in 2 months. Follows with Dr Lucianne Muss of Endocrine. I think patient will also benefit from Trulicity with both glycemic control and weight loss.  Plan: Start Trulicity 0.75 weekly for 4 week and titrate up to 1.5 mg weekly starting week 5 Continue on Metformin ER 1500 daily Continue on Glimepiride 1 mg BID Continue on Jardiance 25 mg Continue on Tresiba 24 units daily

## 2024-02-29 NOTE — Progress Notes (Signed)
 Internal Medicine Clinic Attending  Case discussed with the resident at the time of the visit.  We reviewed the resident's history and exam and pertinent patient test results.  I agree with the assessment, diagnosis, and plan of care documented in the resident's note.

## 2024-03-02 ENCOUNTER — Telehealth: Payer: Self-pay | Admitting: *Deleted

## 2024-03-02 NOTE — Telephone Encounter (Signed)
 Copied from CRM [id number]. Topic: General - Other >> Mar 02, 2024  3:45 PM Corin V wrote: Reason for CRM: Patient stated that when he was in the office last Wednesday, he would be getting a call about the PFT test and he has not received a call about setting up the appointment yet. Please call back with an update at [phone].

## 2024-03-13 ENCOUNTER — Ambulatory Visit (HOSPITAL_COMMUNITY)
Admission: RE | Admit: 2024-03-13 | Discharge: 2024-03-13 | Disposition: A | Discharge status: Home / Self Care | Source: Ambulatory Visit | Attending: Internal Medicine | Admitting: Internal Medicine

## 2024-03-13 DIAGNOSIS — R0609 Other forms of dyspnea: Secondary | ICD-10-CM | POA: Insufficient documentation

## 2024-03-13 LAB — PULMONARY FUNCTION TEST
DL/VA % pred: 114 %
DL/VA: 4.75 ml/min/mmHg/L
DLCO unc % pred: 87 %
DLCO unc: 25.07 ml/min/mmHg
FEF 25-75 Post: 3.52 L/s
FEF 25-75 Pre: 2.9 L/s
FEF2575-%Change-Post: 21 %
FEF2575-%Pred-Post: 116 %
FEF2575-%Pred-Pre: 96 %
FEV1-%Change-Post: 4 %
FEV1-%Pred-Post: 79 %
FEV1-%Pred-Pre: 75 %
FEV1-Post: 2.99 L
FEV1-Pre: 2.86 L
FEV1FVC-%Change-Post: -2 %
FEV1FVC-%Pred-Pre: 106 %
FEV6-%Change-Post: 7 %
FEV6-%Pred-Post: 80 %
FEV6-%Pred-Pre: 74 %
FEV6-Post: 3.83 L
FEV6-Pre: 3.58 L
FEV6FVC-%Change-Post: 0 %
FEV6FVC-%Pred-Post: 104 %
FEV6FVC-%Pred-Pre: 104 %
FVC-%Change-Post: 7 %
FVC-%Pred-Post: 76 %
FVC-%Pred-Pre: 71 %
FVC-Post: 3.85 L
FVC-Pre: 3.58 L
Post FEV1/FVC ratio: 78 %
Post FEV6/FVC ratio: 100 %
Pre FEV1/FVC ratio: 80 %
Pre FEV6/FVC Ratio: 100 %
RV % pred: 88 %
RV: 2.15 L
TLC % pred: 82 %
TLC: 6.12 L

## 2024-03-13 MED ORDER — ALBUTEROL SULFATE (2.5 MG/3ML) 0.083% IN NEBU
2.5000 mg | INHALATION_SOLUTION | Freq: Once | RESPIRATORY_TRACT | Status: AC
  Administered 2024-03-13: 2.5 mg via RESPIRATORY_TRACT

## 2024-03-30 ENCOUNTER — Encounter: Payer: Self-pay | Admitting: Student

## 2024-04-05 ENCOUNTER — Other Ambulatory Visit: Payer: Self-pay

## 2024-04-05 DIAGNOSIS — E1142 Type 2 diabetes mellitus with diabetic polyneuropathy: Secondary | ICD-10-CM

## 2024-04-05 MED ORDER — GABAPENTIN 600 MG PO TABS: ORAL_TABLET | ORAL | 1 refills | Status: DC

## 2024-04-14 ENCOUNTER — Ambulatory Visit: Payer: Self-pay

## 2024-04-14 NOTE — Telephone Encounter (Signed)
  Chief Complaint: fell Symptoms: sore  Disposition: [] ED /[] Urgent Care (no appt availability in office) / [x] Appointment(In office/virtual)/ []  [facility]/ [] Home Care/ [] Refused Recommended Disposition /[] [facility]/ []  Follow-up with PCP Additional Notes: Pt called with concerns from on fall on 4/2. Pt stated he was sitting and went to stand and left foot seemed to buckle. Pt fell on left knee. Pt denies any swelling or bruising. Denies hitting head. Pt rates pain 1/10. Pt seemed delayed in responding to questions. Pt would say "I'm having  hard time explaining this." Pt denies any new onset weakness on one side. No slurred speech. No headache/vision changes. Right knee has been weak "for awhile." Pt uses cane to walk. Pt lives alone. Appt 4/28 @ 1315. RN gave care advice and when to seek care at ED. Pt verbalized understanding.             Copied from CRM [id number]. Topic: Clinical - Red Word Triage >> Apr 14, 2024 10:40 AM Tiffany H wrote: Patient/patient representative is calling to schedule an appointment. Refer to attachments for appointment information.   Patient advised that he fell yesterday. He advised that he had some disorientation before he fell and that it wasn't worse afterwards.   Patient advised that his foot was hurting when he got back up. Pain idles at around a 5. Pain is located in the center of the foot, on top flat part of instep. Please assist. Reason for Disposition  MILD weakness (i.e., does not interfere with ability to work, go to school, normal activities)  (Exception: Mild weakness is a chronic symptom.)  Answer Assessment - Initial Assessment Questions 1. MECHANISM: "How did the fall happen?"     Went from sitting to standing  2. DOMESTIC VIOLENCE AND ELDER ABUSE SCREENING: "Did you fall because someone pushed you or tried to hurt you?" If Yes, ask: "Are you safe now?"     na 3. ONSET: "When did the fall happen?" (e.g., minutes,  hours, or days ago)     Tuesday  4. LOCATION: "What part of the body hit the ground?" (e.g., back, buttocks, head, hips, knees, hands, head, stomach)     Left knee  5. INJURY: "Did you hurt (injure) yourself when you fell?" If Yes, ask: "What did you injure? Tell me more about this?" (e.g., body area; type of injury; pain severity)"     Denies  6. PAIN: "Is there any pain?" If Yes, ask: "How bad is the pain?" (e.g., Scale 1-10; or mild,  moderate, severe)   - NONE (0): No pain   - MILD (1-3): Doesn't interfere with normal activities    - MODERATE (4-7): Interferes with normal activities or awakens from sleep    - SEVERE (8-10): Excruciating pain, unable to do any normal activities      1 7. SIZE: For cuts, bruises, or swelling, ask: "How large is it?" (e.g., inches or centimeters)      Denies   9. OTHER SYMPTOMS: "Do you have any other symptoms?" (e.g., dizziness, fever, weakness; new onset or worsening).      Denies  10. CAUSE: "What do you think caused the fall (or falling)?" (e.g., tripped, dizzy spell)       Not sure  Protocols used: Falls and [facility]

## 2024-04-14 NOTE — Telephone Encounter (Signed)
 Appt has been scheduled w/Dr Sharlon Deacon 4/28.

## 2024-04-17 ENCOUNTER — Ambulatory Visit: Payer: Self-pay | Admitting: Student

## 2024-04-17 VITALS — BP 140/97 | HR 124 | Temp 98.7°F | Ht 72.0 in | Wt 333.5 lb

## 2024-04-17 DIAGNOSIS — E1142 Type 2 diabetes mellitus with diabetic polyneuropathy: Secondary | ICD-10-CM

## 2024-04-17 DIAGNOSIS — F321 Major depressive disorder, single episode, moderate: Secondary | ICD-10-CM | POA: Diagnosis not present

## 2024-04-17 DIAGNOSIS — W19XXXA Unspecified fall, initial encounter: Secondary | ICD-10-CM | POA: Diagnosis not present

## 2024-04-17 DIAGNOSIS — E1165 Type 2 diabetes mellitus with hyperglycemia: Secondary | ICD-10-CM | POA: Diagnosis not present

## 2024-04-17 DIAGNOSIS — Z794 Long term (current) use of insulin: Secondary | ICD-10-CM

## 2024-04-17 DIAGNOSIS — Z7984 Long term (current) use of oral hypoglycemic drugs: Secondary | ICD-10-CM

## 2024-04-17 MED ORDER — FREESTYLE LIBRE 3 READER DEVI
3 refills | Status: AC
Start: 2024-04-17 — End: ?

## 2024-04-17 MED ORDER — TRESIBA FLEXTOUCH 100 UNIT/ML ~~LOC~~ SOPN: 30.0000 [IU] | PEN_INJECTOR | Freq: Every day | SUBCUTANEOUS | 1 refills | Status: DC

## 2024-04-17 MED ORDER — FREESTYLE LIBRE 3 READER DEVI: 3 refills | Status: DC

## 2024-04-17 MED ORDER — FREESTYLE LIBRE 3 SENSOR MISC: 3 refills | Status: DC

## 2024-04-17 MED ORDER — TRESIBA FLEXTOUCH 100 UNIT/ML ~~LOC~~ SOPN: 25.0000 [IU] | PEN_INJECTOR | Freq: Every day | SUBCUTANEOUS | 1 refills | Status: DC

## 2024-04-17 MED ORDER — TRULICITY 3 MG/0.5ML ~~LOC~~ SOAJ: 3.0000 mg | SUBCUTANEOUS | 2 refills | Status: DC

## 2024-04-17 MED ORDER — NOVOLOG FLEXPEN 100 UNIT/ML ~~LOC~~ SOPN: PEN_INJECTOR | SUBCUTANEOUS | 11 refills | Status: DC

## 2024-04-17 NOTE — Patient Instructions (Addendum)
 Thank you, Mr.Aidenn A Parkerson for allowing us  to provide your care today. Today we discussed your recent fall, diabetes,a nd depression.  For your diabetes: -Please decrease your Tresiba  (long acting) insulin  amount to 25 units daily -Please follow the instructions for the Novolog  (meal time) insulin : -Inject 8 units with your largest meal of the day (dinner).  -Inject 4 units ONLY if you eat breakfast OR if your blood sugar is over 250 -Inject 4 units ONLY if you eat lunch OR if your blood sugar is over 250   Referrals ordered today:    Referral Orders         Ambulatory referral to Integrated Behavioral Health         Ambulatory referral to Psychiatry      I have ordered the following medication/changed the following medications:   Stop the following medications: Medications Discontinued During This Encounter  Medication Reason   Dulaglutide (TRULICITY) 0.75 MG/0.5ML SOAJ Completed Course   insulin  degludec (TRESIBA  FLEXTOUCH) 100 UNIT/ML FlexTouch Pen Reorder   insulin  aspart (NOVOLOG  FLEXPEN) 100 UNIT/ML FlexPen Reorder     Start the following medications: Meds ordered this encounter  Medications   insulin  degludec (TRESIBA  FLEXTOUCH) 100 UNIT/ML FlexTouch Pen    Sig: Inject 30 Units into the skin daily.    Dispense:  25 mL    Refill:  1   insulin  aspart (NOVOLOG  FLEXPEN) 100 UNIT/ML FlexPen    Sig: Inject 8 units with your largest meal of the day (dinner). Inject 4 units ONLY if you eat breakfast or if your blood sugar is over 250 Inject 4 units ONLY if you eat lunch or if your blood sugar is over 250    Dispense:  15 mL    Refill:  11     Follow up: 1 month: Depression and hypoglycemia    Should you have any questions or concerns please call the internal medicine clinic at [phone].     Please note that our late policy has changed.  If you are more than 15 minutes late to your appointment, you may be asked to reschedule your appointment.  Dr. Sharlon Deacon, D.O. [facility]  [facility]     [facility] - preferred! URGENT CARE SERVICES - WALK IN Phone: [phone]  Address: [address] [place], [place] [address]  Hours:Open 24/7, No appointment required.  URGENT CARE SERVICES Assessment:  mental health evaluation, assessing immediate safety concerns and further mental health needs.  Referral: Resources, Connect to community-based mental health treatment, when indicated, including psychotherapy, psychiatry and other specialized behavioral health or substance use disorder services (for those not already in treatment).  Transitional care: In-person assessment and/or virtual follow up during the patient's transition to connect them with the appropriate outpatient services.  OUTPATIENT SERVICES  Individual Therapy Partial Hospitalization Program (PHP) Substance Abuse Intensive Outpatient Program [facility]) Specialized Intensive Adult Group Therapy Medication Management Peer Living Room

## 2024-04-17 NOTE — Progress Notes (Unsigned)
 Established Patient Office Visit  Subjective   Patient ID: Jack Wallace, male    DOB: [birth year]  Age: 63 y.o. MRN: [id number]  Chief Complaint  Patient presents with   Jack Wallace last week    Jack Wallace is a 63 y.o. who presents to the clinic for a fall last week.   Per patient, he was laying on the couch with his left leg hanging off the couch and when he went to stand up, his left foot felt "numb" and placing his weight on his left foot causing him to fall.  In the conversation, patient reported feeling "shaky" during this fall which is usually how he feels when he is hypoglycemic.  Patient did not check his blood sugar before or after the fall.  He denies syncope, hitting his head, injury to extremities or joints, new weakness.  He does use a cane for ambulation outside of the house but does not have a cane within the house. He denies tripping on any objects on the floor.  Please see problem based assessment and plan for additional details.   Patient Active Problem List   Diagnosis Date Noted   Fall 04/18/2024   Exertional dyspnea 10/19/2023   Tachycardia 10/18/2023   Stress fracture of metatarsal bone of left foot 05/24/2023   AKI (acute kidney injury) (HCC) 09/02/2022   Bilateral leg pain 07/03/2022   Hyperkalemia 01/07/2022   Pain of joint of left ankle and foot 04/01/2018   MDD (major depressive disorder) 11/13/2015   GERD (gastroesophageal reflux disease) 08/20/2015   Chronic nausea 03/29/2015   Preventative health care 08/23/2014   Diabetic neuropathy (HCC) 08/01/2013   Hypertension associated with diabetes (HCC) 07/31/2013   Hyperlipidemia 07/31/2013   Type 2 diabetes mellitus with peripheral neuropathy (HCC) 07/31/1992      Objective:     BP (!) 140/97 (BP Location: Left Arm, Patient Position: Sitting, Cuff Size: Large)   Pulse (!) 124   Temp 98.7 F (37.1 C) (Oral)   Ht 6' (1.829 m)   Wt (!) 333 lb 8 oz (151.3 kg)   SpO2 95%   BMI 45.23 kg/m   BP Readings from Last 3 Encounters:  04/17/24 (!) 140/97  02/23/24 124/81  02/02/24 138/82   Wt Readings from Last 3 Encounters:  04/17/24 (!) 333 lb 8 oz (151.3 kg)  02/23/24 (!) 325 lb 8 oz (147.6 kg)  02/02/24 (!) 329 lb 12.8 oz (149.6 kg)   Orthostatic vital signs: Supine: 140/104 Sitting: 146/99 Standing 0 minutes: 145/94 Standing 3 minutes: 158/88   Physical Exam Vitals reviewed.  Constitutional:      Appearance: He is obese.  Cardiovascular:     Rate and Rhythm: Normal rate and regular rhythm.     Heart sounds: Normal heart sounds. No murmur heard. Pulmonary:     Effort: Pulmonary effort is normal. No respiratory distress.     Breath sounds: No wheezing.  Musculoskeletal:     Right lower leg: No edema.     Left lower leg: No edema.  Skin:    General: Skin is warm and dry.     Findings: No abrasion, bruising, ecchymosis, signs of injury or wound.  Neurological:     Mental Status: He is alert.     Motor: Motor function is intact. No weakness or tremor.     Coordination: Heel to Shin Test normal.     Comments: Patient declined tandem gait testing  LLE and RLE were  5/5 motor strength   Psychiatric:        Mood and Affect: Mood is depressed. Affect is flat.        Thought Content: Thought content includes suicidal ideation. Thought content does not include homicidal ideation. Thought content does not include homicidal or suicidal plan.      Last metabolic panel Lab Results  Component Value Date   GLUCOSE 277 (H) 01/27/2024   NA 139 01/27/2024   K 3.9 01/27/2024   CL 102 01/27/2024   CO2 25 01/27/2024   BUN 15 01/27/2024   CREATININE 1.10 01/27/2024   GFRNONAA >60 10/19/2023   CALCIUM  9.3 01/27/2024   PHOS 3.5 05/13/2014   PROT 7.8 10/15/2023   ALBUMIN 4.6 10/15/2023   BILITOT 0.7 10/15/2023   ALKPHOS 68 10/15/2023   AST 16 10/15/2023   ALT 14 10/15/2023   ANIONGAP 13 10/19/2023   Last lipids Lab Results  Component Value Date   CHOL 169  10/15/2023   HDL 49.80 10/15/2023   LDLCALC 86 10/15/2023   LDLDIRECT 85.0 07/30/2022   TRIG 164.0 (H) 10/15/2023   CHOLHDL 3 10/15/2023   Last hemoglobin A1c Lab Results  Component Value Date   HGBA1C 9.9 (A) 02/02/2024      The 10-year ASCVD risk score (Arnett DK, et al., 2019) is: 23.6%    Assessment & Plan:   Problem List Items Addressed This Visit       Endocrine   Type 2 diabetes mellitus with peripheral neuropathy [facility])   Patient presents with a history of T2DM followed by endocrinology. Today, he reports concerns about hypoglycemic episodes have been happening over the past 4 weeks.  He stated that he becomes hypoglycemic with readings in the 50s to 60s in the late afternoons.  He is currently on a regimen of Jardiance  25 mg, Trulicity 1.5 weekly, Tresiba  30 units in the afternoon, NovoLog  8 units with meals 3 times a day, metformin  1500, glimepiride  1 mg twice daily.  Patient reports that he only eats 1 meal per day which is dinner.  He stated that he continues to use NovoLog  8 units at breakfast time and lunchtime regardless of if he eats or not. I suspect that his hypoglycemic events are due to using NovoLog  8 units when he is not eating meals.  Patient technically has fasting blood glucose as well, he does not eat until dinnertime and has hypoglycemia prior to this. Plan: -Decrease Tresiba  to 25 units daily - Patient was instructed to use 8 units of NovoLog  with dinner (largest meal of the day), use 4 units of NovoLog  with breakfast and lunch only if he eats or if his glucose is greater than 250.  Patient voiced understanding of this plan. - Endocrinologist was informed of plan - Will continue Jardiance , metformin , glimepiride , will increase Trulicity to 3 mg since he is tolerating 1.5 mg for the past 4 weeks.        Relevant Medications   insulin  aspart (NOVOLOG  FLEXPEN) 100 UNIT/ML FlexPen   Dulaglutide (TRULICITY) 3 MG/0.5ML SOAJ   insulin  degludec (TRESIBA   FLEXTOUCH) 100 UNIT/ML FlexTouch Pen     Other   MDD (major depressive disorder) - Primary (Chronic)   Patient's PHQ 9 score was elevated to 15 today on Zoloft  200mg .  He reported passive suicidal thoughts but denies active suicidal plans.  He denies homicidal ideations or plans. Plan: -Continue Zoloft  200 mg -Refer to counseling -Refer to psychiatry -Patient was given emergency resources in regards to suicidal  thoughts, he was asked to contact the emergency hotline if he were to have suicidal plans/ worsening passive suicidal thoughts       Relevant Orders   Ambulatory referral to Integrated Behavioral Health   Ambulatory referral to Psychiatry   Fall   Per patient, he was laying on the couch with his left leg hanging off the couch and when he went to stand up, his left foot felt "numb" and placing his weight on his left foot causing him to fall.  In the conversation, patient reported feeling "shaky" during this fall which is usually how he feels when he is hypoglycemic.  Patient did not check his blood sugar before or after the fall.  He denies syncope, hitting his head, injury to extremities or joints, new weakness.  He does use a cane for ambulation outside of the house but does not have a cane within the house. He denies tripping on any objects on the floor.  Psychical exam showed symmetrical strength of the bilateral LE at 5/5. Orthostatic vital changes were present from supine to standing at 3 minutes.  I suspect that his fall was multifactorial from orthostatic blood pressure changes, hypoglycemia, and neuropathy from the position he wa slaying on the couch.  Plan: -Hypoglycemia addressed in the diabetes plan -Orthostatic vital changes: patient has HTN on an extensive regimen, per blood pressure readings: he does not have room to rid of medication or room to decrease medication dosage. Will recommend compression stockings.       Other Visit Diagnoses       Type 2 diabetes  mellitus with hyperglycemia, with long-term current use of insulin  (HCC)       Relevant Medications   insulin  aspart (NOVOLOG  FLEXPEN) 100 UNIT/ML FlexPen   Dulaglutide (TRULICITY) 3 MG/0.5ML SOAJ   insulin  degludec (TRESIBA  FLEXTOUCH) 100 UNIT/ML FlexTouch Pen     Uncontrolled type 2 diabetes mellitus with hyperglycemia, with long-term current use of insulin  (HCC)       Relevant Medications   insulin  aspart (NOVOLOG  FLEXPEN) 100 UNIT/ML FlexPen   Dulaglutide (TRULICITY) 3 MG/0.5ML SOAJ   insulin  degludec (TRESIBA  FLEXTOUCH) 100 UNIT/ML FlexTouch Pen       Return in about 4 weeks (around 05/15/2024).    Aurora Lees, DO

## 2024-04-18 DIAGNOSIS — W19XXXA Unspecified fall, initial encounter: Secondary | ICD-10-CM | POA: Insufficient documentation

## 2024-04-18 NOTE — Assessment & Plan Note (Signed)
 Patient presents with a history of T2DM followed by endocrinology. Today, he reports concerns about hypoglycemic episodes have been happening over the past 4 weeks.  He stated that he becomes hypoglycemic with readings in the 50s to 60s in the late afternoons.  He is currently on a regimen of Jardiance  25 mg, Trulicity 1.5 weekly, Tresiba  30 units in the afternoon, NovoLog  8 units with meals 3 times a day, metformin  1500, glimepiride  1 mg twice daily.  Patient reports that he only eats 1 meal per day which is dinner.  He stated that he continues to use NovoLog  8 units at breakfast time and lunchtime regardless of if he eats or not. I suspect that his hypoglycemic events are due to using NovoLog  8 units when he is not eating meals.  Patient technically has fasting blood glucose as well, he does not eat until dinnertime and has hypoglycemia prior to this. Plan: -Decrease Tresiba  to 25 units daily - Patient was instructed to use 8 units of NovoLog  with dinner (largest meal of the day), use 4 units of NovoLog  with breakfast and lunch only if he eats or if his glucose is greater than 250.  Patient voiced understanding of this plan. - Endocrinologist was informed of plan - Will continue Jardiance , metformin , glimepiride , will increase Trulicity to 3 mg since he is tolerating 1.5 mg for the past 4 weeks.

## 2024-04-18 NOTE — Assessment & Plan Note (Addendum)
 Patient's PHQ 9 score was elevated to 15 today on Zoloft  200mg .  He reported passive suicidal thoughts but denies active suicidal plans.  He denies homicidal ideations or plans. Plan: -Continue Zoloft  200 mg -Refer to counseling -Refer to psychiatry -Patient was given emergency resources in regards to suicidal thoughts, he was asked to contact the emergency hotline if he were to have suicidal plans/ worsening passive suicidal thoughts

## 2024-04-18 NOTE — Assessment & Plan Note (Signed)
 Per patient, he was laying on the couch with his left leg hanging off the couch and when he went to stand up, his left foot felt "numb" and placing his weight on his left foot causing him to fall.  In the conversation, patient reported feeling "shaky" during this fall which is usually how he feels when he is hypoglycemic.  Patient did not check his blood sugar before or after the fall.  He denies syncope, hitting his head, injury to extremities or joints, new weakness.  He does use a cane for ambulation outside of the house but does not have a cane within the house. He denies tripping on any objects on the floor.  Psychical exam showed symmetrical strength of the bilateral LE at 5/5. Orthostatic vital changes were present from supine to standing at 3 minutes.  I suspect that his fall was multifactorial from orthostatic blood pressure changes, hypoglycemia, and neuropathy from the position he wa slaying on the couch.  Plan: -Hypoglycemia addressed in the diabetes plan -Orthostatic vital changes: patient has HTN on an extensive regimen, per blood pressure readings: he does not have room to rid of medication or room to decrease medication dosage. Will recommend compression stockings.

## 2024-04-19 ENCOUNTER — Telehealth: Payer: Self-pay | Admitting: *Deleted

## 2024-04-19 NOTE — Progress Notes (Signed)
 Complex Care Management Note Care Guide Note  04/19/2024 Name: Jack Wallace MRN: [id number] DOB: [birth year]   Complex Care Management Outreach Attempts: An unsuccessful telephone outreach was attempted today to offer the patient information about available complex care management services.  Follow Up Plan:  Additional outreach attempts will be made to offer the patient complex care management information and services.   Encounter Outcome:  No Answer  [name]  [facility]  [facility], [facility] Guide  Direct Dial: [phone]  Fax [phone]

## 2024-04-20 ENCOUNTER — Other Ambulatory Visit: Payer: Self-pay | Admitting: Student

## 2024-04-20 ENCOUNTER — Telehealth: Payer: Self-pay | Admitting: Student

## 2024-04-20 MED ORDER — FREESTYLE LIBRE 3 PLUS SENSOR MISC: 11 refills | Status: AC

## 2024-04-20 NOTE — Progress Notes (Signed)
 Complex Care Management Note Care Guide Note  Amillion Helderman niece made an incoming call no release on file. Ask if Camilo Cella had POA paperwork and she was unable able to give. Camilo Cella stated "thank you for wasting my time I work for the hospital" and hung up on care guide. I reached back out to patient and left message to return call.    04/20/2024 Name: LAN HODGSON MRN: [id number] DOB: Aug 24, 1961   Complex Care Management Outreach Attempts: A second unsuccessful outreach was attempted today to offer the patient with information about available complex care management services.  Follow Up Plan:  Additional outreach attempts will be made to offer the patient complex care management information and services.   Encounter Outcome:  No Answer  [name] [facility]  [facility], [facility] Guide  Direct Dial: [phone]  Fax [phone]

## 2024-04-20 NOTE — Telephone Encounter (Unsigned)
 Copied from CRM [id number]. Topic: Clinical - Prescription Issue >> Apr 20, 2024 10:18 AM Retta Caster wrote: Reason for CRM: Continuous Glucose Sensor (FREESTYLE LIBRE 3 SENSOR) MISC   Madelyne Schiff from [facility] requesting order changed to [employer] 3 plus Sensors due to the 3 sensors are discontinued. Needs call back or just send new Script.   [id number] Jonette Nestle, [place] - [address] AT [address] [place] [address] Phone: [phone] Fax: [phone] Hours: Not open 24 hours

## 2024-04-20 NOTE — Progress Notes (Signed)
 Complex Care Management Note  Care Guide Note 04/20/2024 Name: Jack Wallace MRN: [id number] DOB: 1961-11-05  Jack Wallace is a 63 y.o. year old male who sees Fay Hoop, MD for primary care. I reached out to Jack Wallace by phone today to offer complex care management services.  Mr. Bundren was given information about Complex Care Management services today including:   The Complex Care Management services include support from the care team which includes your Nurse Care Manager, Clinical Social Worker, or Pharmacist.  The Complex Care Management team is here to help remove barriers to the health concerns and goals most important to you. Complex Care Management services are voluntary, and the patient may decline or stop services at any time by request to their care team member.   Complex Care Management Consent Status: Patient agreed to services and verbal consent obtained.   Follow up plan:  Telephone appointment with complex care management team member scheduled for:  04/27/24  Encounter Outcome:  Patient Scheduled  [name]  [facility]  [facility], [facility] Guide  Direct Dial: [phone]  Fax [phone]

## 2024-04-21 ENCOUNTER — Encounter: Payer: Self-pay | Admitting: Student

## 2024-04-21 ENCOUNTER — Other Ambulatory Visit: Payer: Self-pay | Admitting: Student

## 2024-04-21 NOTE — Progress Notes (Signed)
 Spoke with patient niece (contact alternative). I instructed her to inform the patient of this message:  -Call the clinic back -Return for a orthostatic blood pressure check in one week -Wear compression stockings -Increase water  intake.  Patient was sent a mychart message as well.

## 2024-04-24 NOTE — Progress Notes (Signed)
 Internal Medicine Clinic Attending  Case discussed with the resident at the time of the visit.  We reviewed the resident's history and exam and pertinent patient test results.  I agree with the assessment, diagnosis, and plan of care documented in the resident's note.  Fall sounds mechanical in the setting of his leg being asleep, however additional symptoms of shakiness also concerning. Insulin  adjustments discussed to avoid hypoglycemia. Diastolic blood pressures decreased with position changes although systolic remained elevated. Encouraged hydration and use of compression stockings with return to clinic next week for repeat BP assessment.

## 2024-04-25 LAB — LAB REPORT - SCANNED: A1c: 8.1

## 2024-04-26 ENCOUNTER — Encounter: Admitting: Student

## 2024-04-27 ENCOUNTER — Other Ambulatory Visit: Payer: Self-pay | Admitting: Licensed Clinical Social Worker

## 2024-04-27 NOTE — Patient Outreach (Signed)
Called pt twice left voicemail

## 2024-04-30 NOTE — Progress Notes (Unsigned)
 Cardiology Office Note:  .   Date:  04/30/2024  ID:  [name], DOB [birth year], MRN [id number] PCP: Fay Hoop, MD   HeartCare Providers Cardiologist:  Fransico Ivy, MD PCP: Fay Hoop, MD  No chief complaint on file.     History of Present Illness: .    Jack Wallace is a 63 y.o. male with hypertension, uncontrolled type 2 diabetes mellitus, obesity, persistent sinus tachycardia.   Since his last visit with me, he underwent echocardiogram and CTA that did not show any significant abnormality.  He underwent ORIF of left foot 5th metatarsal by Dr. Julio Ohm.  He saw Dr. Julio Ohm recently, was told that he was healing well.  However, patient continues to have difficulty walking.  Also has continued tachycardia and dyspnea on exertion.  As of this month, he has had a fracture for 5 months, associated with significant deconditioning.  There were no vitals filed for this visit.    ROS:  Review of Systems  Cardiovascular:  Positive for dyspnea on exertion. Negative for chest pain, leg swelling, palpitations and syncope.     Studies Reviewed: Jack Wallace         Independently interpreted 10/2023: Chol 169, TG 164, HDL 49, LDL 86 HbA1C 8.4% Hb 14.4 Cr 1.1, Gl 203   Echocardiogram 09/2023:  1. Left ventricular ejection fraction, by estimation, is 55 to 60%. The  left ventricle has normal function. Left ventricular endocardial border  not optimally defined to evaluate regional wall motion. Left ventricular  diastolic parameters are consistent  with Grade I diastolic dysfunction (impaired relaxation).   2. Right ventricular systolic function is normal. The right ventricular  size is normal. Tricuspid regurgitation signal is inadequate for assessing  PA pressure.   3. The mitral valve is normal in structure. No evidence of mitral valve  regurgitation. No evidence of mitral stenosis.   4. The aortic valve is normal in structure. Aortic valve regurgitation is  not  visualized. No aortic stenosis is present.   5. There is right bowing of the interatrial septum, suggestive of  elevated left atrial pressure. No atrial level shunt detected by color  flow Doppler.   CTA chest 120/20/2024: 1. No evidence of pulmonary embolus. 2. No acute intrathoracic process.      Physical Exam:   Physical Exam Vitals and nursing note reviewed.  Constitutional:      General: He is not in acute distress.    Appearance: He is obese.  Neck:     Vascular: No JVD.  Cardiovascular:     Rate and Rhythm: Regular rhythm. Tachycardia present.     Heart sounds: Normal heart sounds. No murmur heard. Pulmonary:     Effort: Pulmonary effort is normal.     Breath sounds: Normal breath sounds. No wheezing or rales.  Musculoskeletal:     Right lower leg: No edema.     Left lower leg: No edema.     Comments: Orthopedic boot in place in left      VISIT DIAGNOSES: No diagnosis found.    ASSESSMENT AND PLAN: .    Jack Wallace is a 63 y.o. male with hypertension, uncontrolled type 2 diabetes mellitus, obesity, persistent sinus tachycardia.    *** Tachycardia: Resting sinus tachycardia with unclear etiology. TSH normal in 08/2023. Physical exam not consistent with decompensated heart failure. CTA negative for PE, no significant pulmonary pathology on CTA, no significant abnormality on echocardiogram either  (09/2023 ). I do think his continued sinus  tachycardia as well as exertional dyspnea is likely due to deconditioning, as well as obesity. Check 2-week ZIO monitor to rule out any other arrhythmia. Started metoprolol  tartrate 25 mg twice daily. Increase activity as tolerated, and allowed by Dr. Julio Ohm.    Uncontrolled type 2 diabetes mellitus: A1c 8.4%.  Follow-up closely with PCP/endocrinology. Recommend increasing Crestor  to 20 mg daily with goal LDL <70. Check lipid panel in 3 months.       No orders of the defined types were placed in this encounter.     F/u in 3 months  Signed, Cody Das, MD

## 2024-05-01 ENCOUNTER — Encounter: Payer: Self-pay | Admitting: Cardiology

## 2024-05-01 ENCOUNTER — Telehealth: Payer: Self-pay | Admitting: *Deleted

## 2024-05-01 ENCOUNTER — Ambulatory Visit: Payer: BC Managed Care – PPO | Attending: Cardiology | Admitting: Cardiology

## 2024-05-01 VITALS — BP 120/88 | HR 89 | Resp 16 | Ht 72.0 in | Wt 337.2 lb

## 2024-05-01 DIAGNOSIS — R000 Tachycardia, unspecified: Secondary | ICD-10-CM | POA: Diagnosis not present

## 2024-05-01 DIAGNOSIS — E1142 Type 2 diabetes mellitus with diabetic polyneuropathy: Secondary | ICD-10-CM

## 2024-05-01 NOTE — Patient Instructions (Signed)
 Follow-Up: At [facility], you and your health needs are our priority.  As part of our continuing mission to provide you with exceptional heart care, our providers are all part of one team.  This team includes your primary Cardiologist (physician) and Advanced Practice Providers or APPs (Physician Assistants and Nurse Practitioners) who all work together to provide you with the care you need, when you need it.  Your next appointment:   As needed   Provider:   Cody Das, MD    We recommend signing up for the patient portal called "MyChart".  Sign up information is provided on this After Visit Summary.  MyChart is used to connect with patients for Virtual Visits (Telemedicine).  Patients are able to view lab/test results, encounter notes, upcoming appointments, etc.  Non-urgent messages can be sent to your provider as well.   To learn more about what you can do with MyChart, go to [email or web address].

## 2024-05-01 NOTE — Progress Notes (Unsigned)
 Complex Care Management Care Guide Note  05/01/2024 Name: Jack Wallace MRN: [id number] DOB: 12-01-1961  Jack Wallace is a 63 y.o. year old male who is a primary care patient of Amoako, Prince, MD and is actively engaged with the care management team. I reached out to [name] by phone today to assist with re-scheduling  with the Licensed Clinical Child psychotherapist.  Follow up plan: Unsuccessful telephone outreach attempt made. A HIPAA compliant phone message was left for the patient providing contact information and requesting a return call.  [name]  [facility]  [facility] Guide  Direct Dial: [phone]  Fax [phone]

## 2024-05-03 ENCOUNTER — Encounter: Payer: Self-pay | Admitting: Endocrinology

## 2024-05-03 ENCOUNTER — Ambulatory Visit: Payer: BC Managed Care – PPO | Admitting: Endocrinology

## 2024-05-03 VITALS — BP 138/80 | Resp 20 | Ht 72.0 in | Wt 338.0 lb

## 2024-05-03 DIAGNOSIS — E1165 Type 2 diabetes mellitus with hyperglycemia: Secondary | ICD-10-CM

## 2024-05-03 DIAGNOSIS — Z794 Long term (current) use of insulin: Secondary | ICD-10-CM

## 2024-05-03 NOTE — Progress Notes (Signed)
 Patient had labs done at [facility] on 5/1/258 A1C 8.1

## 2024-05-03 NOTE — Progress Notes (Signed)
 Outpatient Endocrinology Note [name], MD  05/03/24  Patient's Name: Jack Wallace    DOB: 1961-03-12    MRN: [id number]                                                    REASON OF VISIT: Follow up of type 2 diabetes mellitus  PCP: Fay Hoop, MD  HISTORY OF PRESENT ILLNESS:   Jack Wallace is a 63 y.o. old male with past medical history listed below, is here for follow up for type 2 diabetes mellitus.  Patient was last seen by Dr. Hubert Madden in July 2024.  Pertinent Diabetes History: Patient was previously seen by Dr. Hubert Madden and was last time seen in July 2024.  Patient was diagnosed with type 2 diabetes mellitus in 1993.  Chronic Diabetes Complications : Retinopathy: no. Last ophthalmology exam was done on 04/2023, following with ophthalmology regularly.  Nephropathy: no, on ACE/ARB / losartan  Peripheral neuropathy: yes, on gabapentin  Coronary artery disease: no Stroke: no  Relevant comorbidities and cardiovascular risk factors: Obesity: yes Body mass index is 45.84 kg/m.  Hypertension: Yes  Hyperlipidemia : Yes, on statin   Current / Home Diabetic regimen includes: Tresiba  25 units daily at bedtime Metformin  extended release 1500 mg daily. Jardiance  25 mg daily.   Amaryl  1 mg two times a day. Novolog  6 units with meals 3 times a day, not clear about dose.  Prior diabetic medications: Actos had used in the past stopped due to fear of long-term side effects.  He had used glipizide , Ozempic  and Victoza  in the past.  Glycemic data:   Glucometer data download from April 32 May 03, 2024 reviewed average blood sugar 107, highest blood sugar 163, lowest blood sugar 43.  Fasting blood sugar 163, 158, blood sugar in the afternoon 91, 133, 118, 96.  Rarely blood sugar low 56, 43 about 2 week ago.  CGM discussed in the past was not cost effective.  Hypoglycemia: Patient has no hypoglycemic episodes. Patient has hypoglycemia awareness.  Factors modifying glucose  control: 1.  Diabetic diet assessment: 3 meals a day.  2.  Staying active or exercising: Currently no exercise, due to left foot fracture.  3.  Medication compliance: compliant all of the time.  Interval history  Glucometer data as reviewed above.  He had occasional hypoglycemia, he reports insulin  regimen was decreased by primary care provider after discussing regarding hypoglycemic episodes.  Beatties regimen as reviewed and noted above.  Tresiba  was decreased from 30 to 25 units and he is currently taking NovoLog  6 units with meals.  Patient hemoglobin A1c on May 1 was 8.1% at PCP office, records reviewed.  He reports after he is back to work his sugars are significantly improved.  No other complaints today.  REVIEW OF SYSTEMS As per history of present illness.   PAST MEDICAL HISTORY: Past Medical History:  Diagnosis Date   Depression    Diabetes mellitus without complication (HCC)    GERD (gastroesophageal reflux disease)    Hyperlipidemia    Hypertension    Ischemic colitis (HCC)    Tachycardia     PAST SURGICAL HISTORY: Past Surgical History:  Procedure Laterality Date   COLONOSCOPY  06/01/2014   NASAL SEPTUM SURGERY     ORIF TOE FRACTURE Left 11/12/2023   Procedure: OPEN REDUCTION INTERNAL FIXATION (ORIF)  5th METATARSAL (TOE) FRACTURE LEFT FOOT;  Surgeon: Timothy Ford, MD;  Location: [facility] OR;  Service: Orthopedics;  Laterality: Left;    ALLERGIES: Allergies  Allergen Reactions   Mounjaro  [Tirzepatide ] Diarrhea and Nausea Only    FAMILY HISTORY:  Family History  Problem Relation Age of Onset   Hypertension Mother    Stroke Father    Diabetes Maternal Grandfather    Colon cancer Neg Hx    Throat cancer Neg Hx    Prostate cancer Neg Hx    Pancreatic cancer Neg Hx    Heart disease Neg Hx    Kidney disease Neg Hx    Liver disease Neg Hx     SOCIAL HISTORY: Social History   Socioeconomic History   Marital status: Single    Spouse name: Not on file   Number  of children: 0   Years of education: Not on file   Highest education level: Not on file  Occupational History   Occupation: Midwife  Tobacco Use   Smoking status: Never   Smokeless tobacco: Never  Vaping Use   Vaping status: Never Used  Substance and Sexual Activity   Alcohol use: No   Drug use: No   Sexual activity: Not on file  Other Topics Concern   Not on file  Social History Narrative   Not on file   Social Drivers of Health   Financial Resource Strain: Not on file  Food Insecurity: Not on file  Transportation Needs: No Transportation Needs (08/27/2022)   PRAPARE - Administrator, Civil Service (Medical): No    Lack of Transportation (Non-Medical): No  Physical Activity: Not on file  Stress: Not on file  Social Connections: Not on file    MEDICATIONS:  Current Outpatient Medications  Medication Sig Dispense Refill   amLODipine  (NORVASC ) 5 MG tablet TAKE 1 TABLET(5 MG) BY MOUTH DAILY 30 tablet 11   BAYER MICROLET LANCETS lancets Use to check blood sugar two times per day 100 each 2   Blood Glucose Monitoring Suppl (CONTOUR NEXT EZ MONITOR) w/Device KIT Use to check blood sugar two times per day 1 kit 2   Continuous Glucose Receiver (FREESTYLE LIBRE 3 READER) DEVI Please use to check your glucose levels daily 1 each 3   Continuous Glucose Sensor (FREESTYLE LIBRE 3 PLUS SENSOR) MISC Change sensor every 15 days. Use to check glucose continuously 2 each 11   Dulaglutide (TRULICITY) 3 MG/0.5ML SOAJ Inject 3 mg as directed once a week. 3 mL 2   empagliflozin  (JARDIANCE ) 25 MG TABS tablet Take 1 tablet (25 mg total) by mouth daily. 90 tablet 3   gabapentin  (NEURONTIN ) 600 MG tablet TAKE 600 mg in the morning and 1200 mg at bedtime 90 tablet 1   glimepiride  (AMARYL ) 1 MG tablet Take 2 tablet daily. (Patient taking differently: Take 1 mg by mouth 2 (two) times daily.) 180 tablet 3   glucose blood (CONTOUR NEXT TEST) test strip TEST 3-4 timesDAILY 300 strip 2    insulin  aspart (NOVOLOG  FLEXPEN) 100 UNIT/ML FlexPen Inject 8 units with your largest meal of the day (dinner). Inject 4 units ONLY if you eat breakfast or if your blood sugar is over 250 Inject 4 units ONLY if you eat lunch or if your blood sugar is over 250 15 mL 11   insulin  degludec (TRESIBA  FLEXTOUCH) 100 UNIT/ML FlexTouch Pen Inject 25 Units into the skin daily. 15 mL 1   Insulin  Pen Needle 32G X 5 MM  MISC 1 Needle by Does not apply route daily. Use 4 x  with Insulin  Pen 300 each 3   losartan  (COZAAR ) 50 MG tablet TAKE 1 TABLET(50 MG) BY MOUTH DAILY 30 tablet 11   Magnesium  Oxide 400 MG CAPS Take 1 capsule (400 mg total) by mouth daily. (Patient taking differently: Take 250 mg by mouth 2 (two) times daily.) 30 capsule 0   metFORMIN  (GLUCOPHAGE -XR) 500 MG 24 hr tablet Take 3 tablets (1,500 mg total) by mouth daily with breakfast. 270 tablet 3   metoprolol  tartrate (LOPRESSOR ) 25 MG tablet Take 1 tablet (25 mg total) by mouth 2 (two) times daily. 180 tablet 3   Multiple Vitamin (MULTIVITAMIN WITH MINERALS) TABS tablet Take 1 tablet by mouth daily.     pantoprazole  (PROTONIX ) 40 MG tablet TAKE 1 TABLET(40 MG) BY MOUTH TWICE DAILY 180 tablet 3   rosuvastatin  (CRESTOR ) 20 MG tablet Take 1 tablet (20 mg total) by mouth daily. 90 tablet 3   sertraline  (ZOLOFT ) 100 MG tablet Take 2 tablets (200 mg total) by mouth daily. 60 tablet 11   vitamin B-12 (CYANOCOBALAMIN ) 500 MCG tablet Take 500 mcg by mouth daily.     No current facility-administered medications for this visit.    PHYSICAL EXAM: Vitals:   05/03/24 0808  BP: 138/80  Resp: 20  SpO2: 93%  Weight: (!) 338 lb (153.3 kg)  Height: 6' (1.829 m)     Body mass index is 45.84 kg/m.  Wt Readings from Last 3 Encounters:  05/03/24 (!) 338 lb (153.3 kg)  05/01/24 (!) 337 lb 3.2 oz (153 kg)  04/17/24 (!) 333 lb 8 oz (151.3 kg)    General: Well developed, well nourished male in no apparent distress.  HEENT: AT/[place], no external lesions.   Eyes: Conjunctiva clear and no icterus. Neck: Neck supple  Lungs: Respirations not labored Neurologic: Alert, oriented, normal speech Extremities / Skin: Dry.  Psychiatric: Does not appear depressed or anxious  Diabetic Foot Exam - Simple   No data filed   LABS Reviewed Lab Results  Component Value Date   HGBA1C 9.9 (A) 02/02/2024   HGBA1C 8.3 (H) 10/15/2023   HGBA1C 7.9 (A) 10/06/2023   Lab Results  Component Value Date   FRUCTOSAMINE 293 (H) 11/16/2022   FRUCTOSAMINE 315 (H) 03/17/2022   FRUCTOSAMINE 294 (H) 02/24/2018   Lab Results  Component Value Date   CHOL 169 10/15/2023   HDL 49.80 10/15/2023   LDLCALC 86 10/15/2023   LDLDIRECT 85.0 07/30/2022   TRIG 164.0 (H) 10/15/2023   CHOLHDL 3 10/15/2023   Lab Results  Component Value Date   MICRALBCREAT NOTE 02/02/2024   MICRALBCREAT 0.8 01/22/2022   Lab Results  Component Value Date   CREATININE 1.10 01/27/2024   Lab Results  Component Value Date   GFR 67.93 10/15/2023    ASSESSMENT / PLAN  1. Uncontrolled type 2 diabetes mellitus with hyperglycemia, with long-term current use of insulin  (HCC)     Diabetes Mellitus type 2, complicated by diabetic neuropathy. - Diabetic status / severity: Uncontrolled, improving.  Lab Results  Component Value Date   HGBA1C 9.9 (A) 02/02/2024    - Hemoglobin A1c goal : <6.5%  Hemoglobin A1c 8.1% on May 1 at PCP office.  Insulin  regimen was recently adjusted due to hypoglycemia.  - Medications: See below.  I) continue Tresiba  25units daily at bedtime. II) continue metformin  extended release 1500 mg daily. III) continue Jardiance  25 mg daily. IV) continue Amaryl  1 mg 2 times  a day.. V) adjust novolog  6-8 units with meals 3 times a day, take 10-15 minutes before eating.  Take up to 8 units of NovoLog  for large meal.  - Home glucose testing: In the morning daily fasting and at bedtime. - Discussed/ Gave Hypoglycemia treatment plan.  # Consult : not required at this  time.   # Annual urine for microalbuminuria/ creatinine ratio, no microalbuminuria currently, continue ACE/ARB losartan .  Last  Lab Results  Component Value Date   MICRALBCREAT NOTE 02/02/2024    # Foot check nightly / neuropathy, continue gabapentin .  # Annual dilated diabetic eye exams.   - Diet: Make healthy diabetic food choices - Life style / activity / exercise: Discussed.  Not able to do exercise due to left foot fracture.  2. Blood pressure  -  BP Readings from Last 1 Encounters:  05/03/24 138/80    - Control is in target.  - No change in current plans.  Follow-up with primary care provider.  Blood pressure is mildly elevated today.  3. Lipid status / Hyperlipidemia - Last  Lab Results  Component Value Date   LDLCALC 86 10/15/2023   - Continue rosuvastatin  20 mg daily.  Managed by cardiology.  LDL recently was 47 on Apr 20, 2024 at PCP office.  Diagnoses and all orders for this visit:  Uncontrolled type 2 diabetes mellitus with hyperglycemia, with long-term current use of insulin  (HCC)   DISPOSITION Follow up in clinic in 3 months suggested.   All questions answered and patient verbalized understanding of the plan.  Iraq Forbes Loll, MD [facility] Endocrinology [facility] [address] [place], [address] [place], [place] [address] Phone # [phone]  At least part of this note was generated using voice recognition software. Inadvertent word errors may have occurred, which were not recognized during the proofreading process.

## 2024-05-03 NOTE — Patient Instructions (Signed)
 Diabetes regimen: Tresiba  25 units daily at bedtime Metformin  extended release 1500 mg daily. Jardiance  25 mg daily.   Amaryl  1 mg two times a day.  Novolog  -6-8 units with meals 3 times a day, take 10-15 minutes before eating.

## 2024-05-04 NOTE — Progress Notes (Signed)
 Complex Care Management Care Guide Note  05/04/2024 Name: Jack Wallace MRN: [id number] DOB: September 14, 1961  Jack Wallace is a 63 y.o. year old male who is a primary care patient of Amoako, Prince, MD and is actively engaged with the care management team. I reached out to [name] by phone today to assist with re-scheduling  with the Licensed Clinical Child psychotherapist.  Follow up plan: Unsuccessful telephone outreach attempt made. A HIPAA compliant phone message was left for the patient providing contact information and requesting a return call. No further outreach attempts will be made at this time. We have been unable to contact the patient to reschedule for complex care management services.   [name]  [facility]  Value-Based Care Institute, [facility] Guide  Direct Dial: [phone]  Fax [phone]

## 2024-05-17 ENCOUNTER — Encounter: Admitting: Student

## 2024-05-24 ENCOUNTER — Ambulatory Visit: Admitting: Student

## 2024-05-24 ENCOUNTER — Encounter: Payer: Self-pay | Admitting: Student

## 2024-05-24 VITALS — BP 125/89 | HR 84 | Temp 98.3°F | Ht 72.0 in | Wt 332.0 lb

## 2024-05-24 DIAGNOSIS — E1142 Type 2 diabetes mellitus with diabetic polyneuropathy: Secondary | ICD-10-CM

## 2024-05-24 DIAGNOSIS — Z7984 Long term (current) use of oral hypoglycemic drugs: Secondary | ICD-10-CM

## 2024-05-24 DIAGNOSIS — E1159 Type 2 diabetes mellitus with other circulatory complications: Secondary | ICD-10-CM | POA: Diagnosis not present

## 2024-05-24 DIAGNOSIS — I152 Hypertension secondary to endocrine disorders: Secondary | ICD-10-CM | POA: Diagnosis not present

## 2024-05-24 DIAGNOSIS — E1165 Type 2 diabetes mellitus with hyperglycemia: Secondary | ICD-10-CM

## 2024-05-24 DIAGNOSIS — F329 Major depressive disorder, single episode, unspecified: Secondary | ICD-10-CM

## 2024-05-24 DIAGNOSIS — Z794 Long term (current) use of insulin: Secondary | ICD-10-CM

## 2024-05-24 DIAGNOSIS — F321 Major depressive disorder, single episode, moderate: Secondary | ICD-10-CM

## 2024-05-24 DIAGNOSIS — Z0000 Encounter for general adult medical examination without abnormal findings: Secondary | ICD-10-CM

## 2024-05-24 LAB — GLUCOSE, CAPILLARY: Glucose-Capillary: 119 mg/dL — ABNORMAL HIGH (ref 70–99)

## 2024-05-24 LAB — POCT GLYCOSYLATED HEMOGLOBIN (HGB A1C): Hemoglobin A1C: 7.2 % — AB (ref 4.0–5.6)

## 2024-05-24 NOTE — Progress Notes (Signed)
 CC: 1 month f/u  HPI:  [name] is a 63 y.o. male living with a history stated below and presents today for 1 month f/u. Please see problem based assessment and plan for additional details.   Past Medical History:  Diagnosis Date   Depression    Diabetes mellitus without complication (HCC)    GERD (gastroesophageal reflux disease)    Hyperlipidemia    Hypertension    Ischemic colitis (HCC)    Tachycardia     Current Outpatient Medications on File Prior to Visit  Medication Sig Dispense Refill   amLODipine  (NORVASC ) 5 MG tablet TAKE 1 TABLET(5 MG) BY MOUTH DAILY 30 tablet 11   BAYER MICROLET LANCETS lancets Use to check blood sugar two times per day 100 each 2   Blood Glucose Monitoring Suppl (CONTOUR NEXT EZ MONITOR) w/Device KIT Use to check blood sugar two times per day 1 kit 2   Continuous Glucose Receiver (FREESTYLE LIBRE 3 READER) DEVI Please use to check your glucose levels daily 1 each 3   Continuous Glucose Sensor (FREESTYLE LIBRE 3 PLUS SENSOR) MISC Change sensor every 15 days. Use to check glucose continuously 2 each 11   Dulaglutide (TRULICITY) 3 MG/0.5ML SOAJ Inject 3 mg as directed once a week. 3 mL 2   empagliflozin  (JARDIANCE ) 25 MG TABS tablet Take 1 tablet (25 mg total) by mouth daily. 90 tablet 3   gabapentin  (NEURONTIN ) 600 MG tablet TAKE 600 mg in the morning and 1200 mg at bedtime 90 tablet 1   glimepiride  (AMARYL ) 1 MG tablet Take 2 tablet daily. (Patient taking differently: Take 1 mg by mouth 2 (two) times daily.) 180 tablet 3   glucose blood (CONTOUR NEXT TEST) test strip TEST 3-4 timesDAILY 300 strip 2   insulin  aspart (NOVOLOG  FLEXPEN) 100 UNIT/ML FlexPen Inject 8 units with your largest meal of the day (dinner). Inject 4 units ONLY if you eat breakfast or if your blood sugar is over 250 Inject 4 units ONLY if you eat lunch or if your blood sugar is over 250 15 mL 11   insulin  degludec (TRESIBA  FLEXTOUCH) 100 UNIT/ML FlexTouch Pen Inject 25 Units  into the skin daily. 15 mL 1   Insulin  Pen Needle 32G X 5 MM MISC 1 Needle by Does not apply route daily. Use 4 x  with Insulin  Pen 300 each 3   losartan  (COZAAR ) 50 MG tablet TAKE 1 TABLET(50 MG) BY MOUTH DAILY 30 tablet 11   Magnesium  Oxide 400 MG CAPS Take 1 capsule (400 mg total) by mouth daily. (Patient taking differently: Take 250 mg by mouth 2 (two) times daily.) 30 capsule 0   metFORMIN  (GLUCOPHAGE -XR) 500 MG 24 hr tablet Take 3 tablets (1,500 mg total) by mouth daily with breakfast. 270 tablet 3   metoprolol  tartrate (LOPRESSOR ) 25 MG tablet Take 1 tablet (25 mg total) by mouth 2 (two) times daily. 180 tablet 3   Multiple Vitamin (MULTIVITAMIN WITH MINERALS) TABS tablet Take 1 tablet by mouth daily.     pantoprazole  (PROTONIX ) 40 MG tablet TAKE 1 TABLET(40 MG) BY MOUTH TWICE DAILY 180 tablet 3   rosuvastatin  (CRESTOR ) 20 MG tablet Take 1 tablet (20 mg total) by mouth daily. 90 tablet 3   sertraline  (ZOLOFT ) 100 MG tablet Take 2 tablets (200 mg total) by mouth daily. 60 tablet 11   vitamin B-12 (CYANOCOBALAMIN ) 500 MCG tablet Take 500 mcg by mouth daily.     No current facility-administered medications on file prior  to visit.    Family History  Problem Relation Age of Onset   Hypertension Mother    Stroke Father    Diabetes Maternal Grandfather    Colon cancer Neg Hx    Throat cancer Neg Hx    Prostate cancer Neg Hx    Pancreatic cancer Neg Hx    Heart disease Neg Hx    Kidney disease Neg Hx    Liver disease Neg Hx     Social History   Socioeconomic History   Marital status: Single    Spouse name: Not on file   Number of children: 0   Years of education: Not on file   Highest education level: Not on file  Occupational History   Occupation: Midwife  Tobacco Use   Smoking status: Never   Smokeless tobacco: Never  Vaping Use   Vaping status: Never Used  Substance and Sexual Activity   Alcohol use: No   Drug use: No   Sexual activity: Not on file  Other Topics  Concern   Not on file  Social History Narrative   Not on file   Social Drivers of Health   Financial Resource Strain: Medium Risk (05/24/2024)   Overall Financial Resource Strain (CARDIA)    Difficulty of Paying Living Expenses: Somewhat hard  Food Insecurity: No Food Insecurity (05/24/2024)   Hunger Vital Sign    Worried About Running Out of Food in the Last Year: Never true    Ran Out of Food in the Last Year: Never true  Transportation Needs: No Transportation Needs (05/24/2024)   PRAPARE - Administrator, Civil Service (Medical): No    Lack of Transportation (Non-Medical): No  Physical Activity: Insufficiently Active (05/24/2024)   Exercise Vital Sign    Days of Exercise per Week: 2 days    Minutes of Exercise per Session: 10 min  Stress: No Stress Concern Present (05/24/2024)   [employer] of Occupational Health - Occupational Stress Questionnaire    Feeling of Stress : Only a little  Social Connections: Socially Isolated (05/24/2024)   Social Connection and Isolation Panel [NHANES]    Frequency of Communication with Friends and Family: Once a week    Frequency of Social Gatherings with Friends and Family: Once a week    Attends Religious Services: Never    Database administrator or Organizations: No    Attends Banker Meetings: Never    Marital Status: Never married  Intimate Partner Violence: Not At Risk (05/24/2024)   Humiliation, Afraid, Rape, and Kick questionnaire    Fear of Current or Ex-Partner: No    Emotionally Abused: No    Physically Abused: No    Sexually Abused: No    Review of Systems: ROS negative except for what is noted on the assessment and plan.  Vitals:   05/24/24 0903 05/24/24 0948  BP: 125/89   Pulse: 88 84  Temp: 98.3 F (36.8 C)   TempSrc: Oral   SpO2: 91% 93%  Weight: (!) 332 lb (150.6 kg)   Height: 6' (1.829 m)    Physical Exam: Constitutional: alert, sitting up in chair comfortably, in no acute  distress Cardiovascular: regular rate and rhythm Pulmonary/Chest: normal work of breathing on room air, lungs clear to auscultation bilaterally, no wheezing or crackles Neurological: alert & oriented x 3 Psych: pleasant mood, normal affect, no SI/HI   Assessment & Plan:   Hypertension associated with diabetes (HCC) BP well controlled 125/89. Taking amlodipine  5  mg and losartan  50 mg. No acute concerns or symptoms. Last BMP 01/2024 with normal electrolytes and renal function.   Plan -continue amlodipine  and losartan   MDD (major depressive disorder) 1 month f/u. States he is feeling well. States his symptoms started after injury to his foot and was not working. Stressors included when he returned to work on 1st shift instead of his routine 3rd shift which he enjoys. Now he is back to 3rd shift and states his mood has improved. Declined depression screening today. Mood appears to be improved on exam. Still taking Zoloft  200 mg daily. Denies SI/HI. Pending psychiatry and VBCI referral, attempted contact but he has not returned call. Provided their office number.   Plan -Patient to contact psych office for appt  -Continue Zoloft    Type 2 diabetes mellitus with peripheral neuropathy (HCC) T2DM followed by [facility] Endocrinology (Dr. Aretha Kubas). A1c improved today to 7.2 from 9.9. At prior OV 4/28, Tresiba  reduced to 25 units due to hypoglycemia. States no further hypoglycemia. Had appt with endo on 5/14.  -Continue following with endocrinology  -On Tresiba  25 units, metformin  1500 mg, Jardiance  25 mg, Amaryl  1 mg BID, Novolog  6-8 units with meals 3 times a day -Ophthalmology referral sent to re-establish   Patient discussed with Dr. Ofilia Benton, D.O. [facility] Internal Medicine, PGY-2 Phone: [phone] Date 05/24/2024 Time 1:00 PM

## 2024-05-24 NOTE — Assessment & Plan Note (Addendum)
 T2DM followed by [facility] Endocrinology (Dr. Aretha Kubas). A1c improved today to 7.2 from 9.9. At prior OV 4/28, Tresiba  reduced to 25 units due to hypoglycemia. States no further hypoglycemia. Had appt with endo on 5/14.  -Continue following with endocrinology  -On Tresiba  25 units, metformin  1500 mg, Jardiance  25 mg, Amaryl  1 mg BID, Novolog  6-8 units with meals 3 times a day -Ophthalmology referral sent to re-establish

## 2024-05-24 NOTE — Assessment & Plan Note (Signed)
 1 month f/u. States he is feeling well. States his symptoms started after injury to his foot and was not working. Stressors included when he returned to work on 1st shift instead of his routine 3rd shift which he enjoys. Now he is back to 3rd shift and states his mood has improved. Declined depression screening today. Mood appears to be improved on exam. Still taking Zoloft  200 mg daily. Denies SI/HI. Pending psychiatry and VBCI referral, attempted contact but he has not returned call. Provided their office number.   Plan -Patient to contact psych office for appt  -Continue Zoloft 

## 2024-05-24 NOTE — Assessment & Plan Note (Signed)
 BP well controlled 125/89. Taking amlodipine  5 mg and losartan  50 mg. No acute concerns or symptoms. Last BMP 01/2024 with normal electrolytes and renal function.   Plan -continue amlodipine  and losartan 

## 2024-05-24 NOTE — Assessment & Plan Note (Deleted)
 Referral to ophthalmology sent today to re-establish care. His prior ophthalmologist office closed.

## 2024-05-24 NOTE — Patient Instructions (Addendum)
 Thank you, Jack Wallace for allowing us  to provide your care today. Today we discussed   -  I am glad you are feeling better. Glad you are back to 3rd shift and your routine.  - Checking on your referrals from last visit  - Blood pressure looks great! Continue amlodipine  and losartan .  - Referral to re-establish eye doctor  [name]  [facility]  Value-Based Care Institute Direct Dial: [phone]  [facility]  [facility] at [facility] [address] [place] [address] [place], [place] [address] Get Driving Directions Main: [phone]   Follow up: 2-3 months   Should you have any questions or concerns please call the internal medicine clinic at [phone].    Maraki Macquarrie, D.O. [facility] Center

## 2024-05-29 NOTE — Progress Notes (Signed)
 Internal Medicine Clinic Attending  Case discussed with the resident at the time of the visit.  We reviewed the resident's history and exam and pertinent patient test results.  I agree with the assessment, diagnosis, and plan of care documented in the resident's note.

## 2024-06-05 ENCOUNTER — Other Ambulatory Visit: Payer: Self-pay

## 2024-06-05 ENCOUNTER — Other Ambulatory Visit: Payer: Self-pay | Admitting: Student

## 2024-06-05 DIAGNOSIS — F321 Major depressive disorder, single episode, moderate: Secondary | ICD-10-CM

## 2024-06-05 DIAGNOSIS — E1142 Type 2 diabetes mellitus with diabetic polyneuropathy: Secondary | ICD-10-CM

## 2024-06-05 MED ORDER — GABAPENTIN 600 MG PO TABS: ORAL_TABLET | ORAL | 1 refills | Status: DC

## 2024-06-05 NOTE — Telephone Encounter (Signed)
 Medication sent to pharmacy

## 2024-07-05 ENCOUNTER — Encounter: Payer: Self-pay | Admitting: Endocrinology

## 2024-07-05 ENCOUNTER — Other Ambulatory Visit: Payer: Self-pay

## 2024-07-05 DIAGNOSIS — E1165 Type 2 diabetes mellitus with hyperglycemia: Secondary | ICD-10-CM

## 2024-07-05 MED ORDER — GLIMEPIRIDE 1 MG PO TABS: 1.0000 mg | ORAL_TABLET | Freq: Two times a day (BID) | ORAL | 1 refills | Status: DC

## 2024-08-10 ENCOUNTER — Other Ambulatory Visit: Payer: Self-pay | Admitting: Endocrinology

## 2024-08-10 DIAGNOSIS — E1142 Type 2 diabetes mellitus with diabetic polyneuropathy: Secondary | ICD-10-CM

## 2024-08-15 ENCOUNTER — Ambulatory Visit: Admitting: Endocrinology

## 2024-08-15 ENCOUNTER — Encounter: Payer: Self-pay | Admitting: Endocrinology

## 2024-08-15 ENCOUNTER — Ambulatory Visit: Payer: Self-pay | Admitting: Endocrinology

## 2024-08-15 VITALS — BP 102/70 | HR 85 | Resp 20 | Ht 72.0 in | Wt 329.6 lb

## 2024-08-15 DIAGNOSIS — E1165 Type 2 diabetes mellitus with hyperglycemia: Secondary | ICD-10-CM | POA: Diagnosis not present

## 2024-08-15 DIAGNOSIS — Z794 Long term (current) use of insulin: Secondary | ICD-10-CM

## 2024-08-15 LAB — POCT GLYCOSYLATED HEMOGLOBIN (HGB A1C): Hemoglobin A1C: 7.1 % — AB (ref 4.0–5.6)

## 2024-08-15 MED ORDER — NOVOLOG FLEXPEN 100 UNIT/ML ~~LOC~~ SOPN
5.0000 [IU] | PEN_INJECTOR | Freq: Three times a day (TID) | SUBCUTANEOUS | 11 refills | Status: AC
Start: 2024-08-15 — End: ?

## 2024-08-15 NOTE — Patient Instructions (Addendum)
 Diabetes regimen: Tresiba  25 units daily at bedtime Metformin  extended release 1500 mg daily. Jardiance  25 mg daily.   Amaryl  1 mg two times a day. Novolog  5 units with meals up to 3 times a day, take 10-15 minutes before eating.  Trulicity  3 mg weekly.

## 2024-08-15 NOTE — Progress Notes (Signed)
 Outpatient Endocrinology Note Jack Delita Chiquito, MD  08/15/24  Patient's Name: Jack Wallace    DOB: 02-Jul-1961    MRN: [id number]                                                    REASON OF VISIT: Follow up of type 2 diabetes mellitus  PCP: Renne Homans, MD  HISTORY OF PRESENT ILLNESS:   Jack Wallace is a 63 y.o. old male with past medical history listed below, is here for follow up for type 2 diabetes mellitus.  Patient was last seen by Dr. Von in July 2024.  Pertinent Diabetes History: Patient was previously and was last time seen by Dr. Von in July 2024.  Patient was diagnosed with type 2 diabetes mellitus in 1993.  Chronic Diabetes Complications : Retinopathy: no. Last ophthalmology exam was done on 04/2023, following with ophthalmology regularly.  Nephropathy: no, on ACE/ARB / losartan  Peripheral neuropathy: yes, on gabapentin  Coronary artery disease: no Stroke: no  Relevant comorbidities and cardiovascular risk factors: Obesity: yes Body mass index is 44.7 kg/m.  Hypertension: Yes  Hyperlipidemia : Yes, on statin   Current / Home Diabetic regimen includes: Tresiba  25 units daily at bedtime Metformin  extended release 1500 mg daily. Jardiance  25 mg daily.   Amaryl  1 mg two times a day. Novolog  6 units with meals 3 times a day, not clear about dose. Trulicity  3mg  weekly.   Prior diabetic medications: Actos had used in the past stopped due to fear of long-term side effects.  He had used glipizide , Ozempic  and Victoza  in the past.  Glycemic data:   Patient is here controlled next glucometer download from August 12 to August 15, 2024 reviewed, average blood sugar 116.  He has been checking mostly in the morning fasting and sometimes after breakfast.  Fasting blood sugar acceptable 102, 109, 136, 159.  He had occasional hypoglycemia after breakfast 45, 46.  CGM discussed in the past was not cost effective.  Hypoglycemia: Patient has no hypoglycemic episodes.  Patient has hypoglycemia awareness.  Factors modifying glucose control: 1.  Diabetic diet assessment: 3 meals a day.  2.  Staying active or exercising: Currently no exercise, due to left foot fracture.  3.  Medication compliance: compliant all of the time.  Interval history  Glucometer data as reviewed above.  Diabetes regimen as reviewed and noted above.  He has also been on Trulicity  for last few months.  He reports occasional nausea.  Diabetes control slightly improved with hemoglobin A1c of 7.1% today.  He has been taking NovoLog  8 units usually 2 times a day with breakfast and supper.  No other complaints today.  REVIEW OF SYSTEMS As per history of present illness.   PAST MEDICAL HISTORY: Past Medical History:  Diagnosis Date   Depression    Diabetes mellitus without complication (HCC)    GERD (gastroesophageal reflux disease)    Hyperlipidemia    Hypertension    Ischemic colitis (HCC)    Tachycardia     PAST SURGICAL HISTORY: Past Surgical History:  Procedure Laterality Date   COLONOSCOPY  06/01/2014   NASAL SEPTUM SURGERY     ORIF TOE FRACTURE Left 11/12/2023   Procedure: OPEN REDUCTION INTERNAL FIXATION (ORIF) 5th METATARSAL (TOE) FRACTURE LEFT FOOT;  Surgeon: Harden Jerona GAILS, MD;  Location: [facility] OR;  Service: Orthopedics;  Laterality: Left;    ALLERGIES: Allergies  Allergen Reactions   Mounjaro  [Tirzepatide ] Diarrhea and Nausea Only    FAMILY HISTORY:  Family History  Problem Relation Age of Onset   Hypertension Mother    Stroke Father    Diabetes Maternal Grandfather    Colon cancer Neg Hx    Throat cancer Neg Hx    Prostate cancer Neg Hx    Pancreatic cancer Neg Hx    Heart disease Neg Hx    Kidney disease Neg Hx    Liver disease Neg Hx     SOCIAL HISTORY: Social History   Socioeconomic History   Marital status: Single    Spouse name: Not on file   Number of children: 0   Years of education: Not on file   Highest education level: Not on file   Occupational History   Occupation: Midwife  Tobacco Use   Smoking status: Never   Smokeless tobacco: Never  Vaping Use   Vaping status: Never Used  Substance and Sexual Activity   Alcohol use: No   Drug use: No   Sexual activity: Not on file  Other Topics Concern   Not on file  Social History Narrative   Not on file   Social Drivers of Health   Financial Resource Strain: Medium Risk (05/24/2024)   Overall Financial Resource Strain (CARDIA)    Difficulty of Paying Living Expenses: Somewhat hard  Food Insecurity: No Food Insecurity (05/24/2024)   Hunger Vital Sign    Worried About Running Out of Food in the Last Year: Never true    Ran Out of Food in the Last Year: Never true  Transportation Needs: No Transportation Needs (05/24/2024)   PRAPARE - Administrator, Civil Service (Medical): No    Lack of Transportation (Non-Medical): No  Physical Activity: Insufficiently Active (05/24/2024)   Exercise Vital Sign    Days of Exercise per Week: 2 days    Minutes of Exercise per Session: 10 min  Stress: No Stress Concern Present (05/24/2024)   [employer] of Occupational Health - Occupational Stress Questionnaire    Feeling of Stress : Only a little  Social Connections: Socially Isolated (05/24/2024)   Social Connection and Isolation Panel    Frequency of Communication with Friends and Family: Once a week    Frequency of Social Gatherings with Friends and Family: Once a week    Attends Religious Services: Never    Database administrator or Organizations: No    Attends Engineer, structural: Never    Marital Status: Never married    MEDICATIONS:  Current Outpatient Medications  Medication Sig Dispense Refill   amLODipine  (NORVASC ) 5 MG tablet TAKE 1 TABLET(5 MG) BY MOUTH DAILY 30 tablet 11   BAYER MICROLET LANCETS lancets Use to check blood sugar two times per day 100 each 2   Blood Glucose Monitoring Suppl (CONTOUR NEXT EZ MONITOR) w/Device KIT Use to check  blood sugar two times per day 1 kit 2   Continuous Glucose Receiver (FREESTYLE LIBRE 3 READER) DEVI Please use to check your glucose levels daily 1 each 3   Continuous Glucose Sensor (FREESTYLE LIBRE 3 PLUS SENSOR) MISC Change sensor every 15 days. Use to check glucose continuously 2 each 11   Dulaglutide  (TRULICITY ) 3 MG/0.5ML SOAJ Inject 3 mg as directed once a week. 3 mL 2   empagliflozin  (JARDIANCE ) 25 MG TABS tablet Take 1 tablet (25 mg total) by mouth daily.  90 tablet 3   gabapentin  (NEURONTIN ) 600 MG tablet TAKE 1 TABLET EVERY MORNING AND 2 AT BEDTIME 90 tablet 1   glimepiride  (AMARYL ) 1 MG tablet Take 1 tablet (1 mg total) by mouth 2 (two) times daily. 180 tablet 1   glucose blood (CONTOUR NEXT TEST) test strip TEST 3-4 timesDAILY 300 strip 2   insulin  degludec (TRESIBA  FLEXTOUCH) 100 UNIT/ML FlexTouch Pen Inject 25 Units into the skin daily. 15 mL 1   Insulin  Pen Needle 32G X 5 MM MISC 1 Needle by Does not apply route daily. Use 4 x  with Insulin  Pen 300 each 3   losartan  (COZAAR ) 50 MG tablet TAKE 1 TABLET(50 MG) BY MOUTH DAILY 30 tablet 11   Magnesium  Oxide 400 MG CAPS Take 1 capsule (400 mg total) by mouth daily. 30 capsule 0   metFORMIN  (GLUCOPHAGE -XR) 500 MG 24 hr tablet Take 3 tablets (1,500 mg total) by mouth daily with breakfast. 270 tablet 3   metoprolol  tartrate (LOPRESSOR ) 25 MG tablet Take 1 tablet (25 mg total) by mouth 2 (two) times daily. 180 tablet 3   Multiple Vitamin (MULTIVITAMIN WITH MINERALS) TABS tablet Take 1 tablet by mouth daily.     pantoprazole  (PROTONIX ) 40 MG tablet TAKE 1 TABLET(40 MG) BY MOUTH TWICE DAILY 180 tablet 3   rosuvastatin  (CRESTOR ) 20 MG tablet Take 1 tablet (20 mg total) by mouth daily. 90 tablet 3   sertraline  (ZOLOFT ) 100 MG tablet TAKE 2 TABLETS(200 MG) BY MOUTH DAILY 60 tablet 11   vitamin B-12 (CYANOCOBALAMIN ) 500 MCG tablet Take 500 mcg by mouth daily.     insulin  aspart (NOVOLOG  FLEXPEN) 100 UNIT/ML FlexPen Inject 5 Units into the skin 3  (three) times daily with meals. 15 mL 11   No current facility-administered medications for this visit.    PHYSICAL EXAM: Vitals:   08/15/24 0845  BP: 102/70  Pulse: 85  Resp: 20  SpO2: 94%  Weight: (!) 329 lb 9.6 oz (149.5 kg)  Height: 6' (1.829 m)      Body mass index is 44.7 kg/m.  Wt Readings from Last 3 Encounters:  08/15/24 (!) 329 lb 9.6 oz (149.5 kg)  05/24/24 (!) 332 lb (150.6 kg)  05/03/24 (!) 338 lb (153.3 kg)    General: Well developed, well nourished male in no apparent distress.  HEENT: AT/[place], no external lesions.  Eyes: Conjunctiva clear and no icterus. Neck: Neck supple  Lungs: Respirations not labored Neurologic: Alert, oriented, normal speech Extremities / Skin: Dry.  Psychiatric: Does not appear depressed or anxious  Diabetic Foot Exam - Simple   No data filed   LABS Reviewed Lab Results  Component Value Date   HGBA1C 7.1 (A) 08/15/2024   HGBA1C 7.2 (A) 05/24/2024   HGBA1C 9.9 (A) 02/02/2024   Lab Results  Component Value Date   FRUCTOSAMINE 293 (H) 11/16/2022   FRUCTOSAMINE 315 (H) 03/17/2022   FRUCTOSAMINE 294 (H) 02/24/2018   Lab Results  Component Value Date   CHOL 169 10/15/2023   HDL 49.80 10/15/2023   LDLCALC 86 10/15/2023   LDLDIRECT 85.0 07/30/2022   TRIG 164.0 (H) 10/15/2023   CHOLHDL 3 10/15/2023   Lab Results  Component Value Date   MICRALBCREAT NOTE 02/02/2024   MICRALBCREAT SEE NOTE 03/29/2015   Lab Results  Component Value Date   CREATININE 1.10 01/27/2024   Lab Results  Component Value Date   GFR 67.93 10/15/2023    ASSESSMENT / PLAN  1. Uncontrolled type 2 diabetes mellitus  with hyperglycemia, with long-term current use of insulin  (HCC)      Diabetes Mellitus type 2, complicated by diabetic neuropathy. - Diabetic status / severity: Uncontrolled, improving.  Lab Results  Component Value Date   HGBA1C 7.1 (A) 08/15/2024    - Hemoglobin A1c goal : <6.5%   - Medications: See below.  I) continue  Tresiba  25 units daily at bedtime. II) continue metformin  extended release 1500 mg daily. III) continue Jardiance  25 mg daily. IV) continue Amaryl  1 mg 2 times a day.. V) decreased novolog  8 to 5 units units with meals 3 times a day, take 10-15 minutes before eating.  Decrease NovoLog  due to hypoglycemia. VI) continue Trulicity  3 mg weekly.  Patient not sure about dose at this time.  He reports he has been receiving Trulicity  and other injectable medication for free from PCP office, ?  Financial patient assistance.  Discussed that nausea may be related to Trulicity .  It is bothersome, does need to be decreased.  He will discuss with primary care provider office as he has been getting Trulicity  with no cost.  - Home glucose testing: In the morning daily fasting and at bedtime. - Discussed/ Gave Hypoglycemia treatment plan.  # Consult : not required at this time.   # Annual urine for microalbuminuria/ creatinine ratio, no microalbuminuria currently, continue ACE/ARB losartan .  Last  Lab Results  Component Value Date   MICRALBCREAT NOTE 02/02/2024    # Foot check nightly / neuropathy, continue gabapentin .  # Annual dilated diabetic eye exams.   - Diet: Make healthy diabetic food choices - Life style / activity / exercise: Discussed.  Not able to do exercise due to left foot fracture.  2. Blood pressure  -  BP Readings from Last 1 Encounters:  08/15/24 102/70    - Control is in target.  - No change in current plans.  3. Lipid status / Hyperlipidemia - Last  Lab Results  Component Value Date   LDLCALC 86 10/15/2023   - Continue rosuvastatin  20 mg daily.  Managed by cardiology.  LDL recently was 47 on Apr 20, 2024 at PCP office.  Diagnoses and all orders for this visit:  Uncontrolled type 2 diabetes mellitus with hyperglycemia, with long-term current use of insulin  (HCC) -     POCT glycosylated hemoglobin (Hb A1C) -     insulin  aspart (NOVOLOG  FLEXPEN) 100 UNIT/ML FlexPen;  Inject 5 Units into the skin 3 (three) times daily with meals.    DISPOSITION Follow up in clinic in 4 months suggested.   All questions answered and patient verbalized understanding of the plan.  Jack Caz Weaver, MD [facility] [address] [place], [address] [place], [place] [address] Phone # [phone]  At least part of this note was generated using voice recognition software. Inadvertent word errors may have occurred, which were not recognized during the proofreading process.

## 2024-08-22 ENCOUNTER — Other Ambulatory Visit: Payer: Self-pay | Admitting: Student

## 2024-08-22 DIAGNOSIS — I10 Essential (primary) hypertension: Secondary | ICD-10-CM

## 2024-08-22 NOTE — Telephone Encounter (Signed)
 Medication sent to pharmacy

## 2024-09-06 ENCOUNTER — Ambulatory Visit: Admitting: Student

## 2024-09-06 VITALS — BP 131/86 | HR 89 | Temp 98.4°F | Ht 72.0 in | Wt 327.8 lb

## 2024-09-06 DIAGNOSIS — E1142 Type 2 diabetes mellitus with diabetic polyneuropathy: Secondary | ICD-10-CM | POA: Diagnosis not present

## 2024-09-06 DIAGNOSIS — Z7984 Long term (current) use of oral hypoglycemic drugs: Secondary | ICD-10-CM

## 2024-09-06 DIAGNOSIS — E785 Hyperlipidemia, unspecified: Secondary | ICD-10-CM | POA: Diagnosis not present

## 2024-09-06 DIAGNOSIS — Z23 Encounter for immunization: Secondary | ICD-10-CM

## 2024-09-06 DIAGNOSIS — Z1211 Encounter for screening for malignant neoplasm of colon: Secondary | ICD-10-CM

## 2024-09-06 DIAGNOSIS — Z794 Long term (current) use of insulin: Secondary | ICD-10-CM

## 2024-09-06 DIAGNOSIS — E1159 Type 2 diabetes mellitus with other circulatory complications: Secondary | ICD-10-CM | POA: Diagnosis not present

## 2024-09-06 DIAGNOSIS — Z79899 Other long term (current) drug therapy: Secondary | ICD-10-CM

## 2024-09-06 DIAGNOSIS — Z6841 Body Mass Index (BMI) 40.0 and over, adult: Secondary | ICD-10-CM

## 2024-09-06 DIAGNOSIS — I152 Hypertension secondary to endocrine disorders: Secondary | ICD-10-CM | POA: Diagnosis not present

## 2024-09-06 DIAGNOSIS — F324 Major depressive disorder, single episode, in partial remission: Secondary | ICD-10-CM

## 2024-09-06 NOTE — Patient Instructions (Signed)
 Thank you, [name] for allowing us  to provide your care today. Today we discussed blood pressure, depression, and diabetes.  Remember that your diabetes doctor changed your Novolog  to 5 units with meals!    I have ordered the following labs for you:   Lab Orders         Basic metabolic panel with GFR         Lipid Profile       Referrals ordered today:    Referral Orders         Ambulatory referral to Gastroenterology      I have ordered the following medication/changed the following medications:   Stop the following medications: There are no discontinued medications.   Start the following medications: No orders of the defined types were placed in this encounter.    Follow up: 6 months or sooner if needed    Should you have any questions or concerns please call the internal medicine clinic at [phone].     Please note that our late policy has changed.  If you are more than 15 minutes late to your appointment, you may be asked to reschedule your appointment.  Dr. Kandis, D.O. [facility]

## 2024-09-06 NOTE — Assessment & Plan Note (Signed)
 Patient has a history of hyperlipidemia with his last LDL at 85 in October 2024.  He is on Crestor  20 mg.  Goal LDL will be less than 100 and with history of diabetes. Plan: - Recheck lipid profile today, continue Crestor  20 mg

## 2024-09-06 NOTE — Assessment & Plan Note (Signed)
 Patient's PHQ-9 score has greatly improved to 1.  He no longer endorses suicidal ideations or plans.  He denies homicidal ideations.  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  Plan: - Continue Zoloft  200 mg

## 2024-09-06 NOTE — Progress Notes (Signed)
 Established Patient Office Visit  Subjective   Patient ID: Jack Wallace, male    DOB: 1961-08-26  Age: 63 y.o. MRN: [id number]  Chief Complaint  Patient presents with   Follow-up    Routine office visit with medication refill / flu shot    [name] is a 63 y.o. who presents to the clinic for HTN, T2DM, MDD follow up. Please see problem based assessment and plan for additional details.   Patient Active Problem List   Diagnosis Date Noted   Morbid obesity with BMI of 40.0-44.9, adult (HCC) 09/06/2024   Fall 04/18/2024   Exertional dyspnea 10/19/2023   Tachycardia 10/18/2023   Stress fracture of metatarsal bone of left foot 05/24/2023   Bilateral leg pain 07/03/2022   Pain of joint of left ankle and foot 04/01/2018   Major depressive disorder with single episode, in partial remission (HCC) 11/13/2015   GERD (gastroesophageal reflux disease) 08/20/2015   Chronic nausea 03/29/2015   Preventative health care 08/23/2014   Diabetic neuropathy (HCC) 08/01/2013   Hypertension associated with diabetes (HCC) 07/31/2013   Hyperlipidemia 07/31/2013   Type 2 diabetes mellitus with peripheral neuropathy (HCC) 07/31/1992      Objective:     BP 131/86 (BP Location: Left Arm, Patient Position: Sitting, Cuff Size: Large)   Pulse 89   Temp 98.4 F (36.9 C) (Oral)   Ht 6' (1.829 m)   Wt (!) 327 lb 12.8 oz (148.7 kg)   SpO2 93%   BMI 44.46 kg/m  BP Readings from Last 3 Encounters:  09/06/24 131/86  08/15/24 102/70  05/24/24 125/89   Wt Readings from Last 3 Encounters:  09/06/24 (!) 327 lb 12.8 oz (148.7 kg)  08/15/24 (!) 329 lb 9.6 oz (149.5 kg)  05/24/24 (!) 332 lb (150.6 kg)      Physical Exam Vitals reviewed.  Constitutional:      General: He is not in acute distress.    Appearance: He is morbidly obese. He is not ill-appearing, toxic-appearing or diaphoretic.  Cardiovascular:     Rate and Rhythm: Normal rate and regular rhythm.     Heart sounds: No murmur  heard. Pulmonary:     Effort: Pulmonary effort is normal. No respiratory distress.     Breath sounds: Normal breath sounds. No wheezing or rales.  Musculoskeletal:     Right lower leg: No edema.     Left lower leg: No edema.  Skin:    General: Skin is warm and dry.  Neurological:     Mental Status: He is alert.  Psychiatric:        Mood and Affect: Mood and affect normal.        Thought Content: Thought content does not include homicidal or suicidal ideation. Thought content does not include homicidal or suicidal plan.      Last metabolic panel Lab Results  Component Value Date   GLUCOSE 277 (H) 01/27/2024   NA 139 01/27/2024   K 3.9 01/27/2024   CL 102 01/27/2024   CO2 25 01/27/2024   BUN 15 01/27/2024   CREATININE 1.10 01/27/2024   GFRNONAA >60 10/19/2023   CALCIUM  9.3 01/27/2024   PHOS 3.5 05/13/2014   PROT 7.8 10/15/2023   ALBUMIN 4.6 10/15/2023   BILITOT 0.7 10/15/2023   ALKPHOS 68 10/15/2023   AST 16 10/15/2023   ALT 14 10/15/2023   ANIONGAP 13 10/19/2023   Last lipids Lab Results  Component Value Date   CHOL 169 10/15/2023  HDL 49.80 10/15/2023   LDLCALC 86 10/15/2023   LDLDIRECT 85.0 07/30/2022   TRIG 164.0 (H) 10/15/2023   CHOLHDL 3 10/15/2023   Last hemoglobin A1c Lab Results  Component Value Date   HGBA1C 7.1 (A) 08/15/2024      The 10-year ASCVD risk score (Arnett DK, et al., 2019) is: 21.3%    Assessment & Plan:   Problem List Items Addressed This Visit       Cardiovascular and Mediastinum   Hypertension associated with diabetes (HCC) (Chronic)   Patient presents with a history of hypertension with a blood pressure today of 131/86. Their hypertension is controlled on a regimen of amlodipine  5mg ,metoprolol  25 mg BID, and Losartan  50 mg.  Prior BMP in February 2025, Scr was 1.10.  Patient denies symptoms of hypotension at home.   Plan: -Continue current regimen of:  amlodipine  5mg ,metoprolol  25 mg BID, and Losartan  50 mg -BMP today        Relevant Orders   Basic metabolic panel with GFR     Endocrine   Type 2 diabetes mellitus with peripheral neuropathy (HCC)   Patient presents with a history of T2DM with a prior A1c of 7.1 in August 2025.   They are on a regimen of Trulicity  3 mg,Jardiance  25 mg, Metformin  1,500 mg, glimepiride  1mg  BID, Tresiba  25 units, and Novolog  8 units BID .  Patient reports one possible episode of hypoglycemia about a few months ago. Per patient,  average fasting blood glucose is 120-160.  Of note patient follows with endocrinology and is actually supposed to be on NovoLog  5 units with meals per prior note, patient was unaware and was notified to only use 5 units of NovoLog  with meals. Plan: -Continue regimen of:Trulicity  3 mg,Jardiance  25 mg, Metformin  1,500 mg, glimepiride  1mg  BID, Tresiba  25 units, and Novolog  5 units with meals -Ophthalmology referral placed in June 2025.         Other   Hyperlipidemia   Patient has a history of hyperlipidemia with his last LDL at 85 in October 2024.  He is on Crestor  20 mg.  Goal LDL will be less than 100 and with history of diabetes. Plan: - Recheck lipid profile today, continue Crestor  20 mg      Relevant Orders   Lipid Profile   Major depressive disorder with single episode, in partial remission (HCC)   Patient's PHQ-9 score has greatly improved to 1.  He no longer endorses suicidal ideations or plans.  He denies homicidal ideations.  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  Plan: - Continue Zoloft  200 mg      Morbid obesity with BMI of 40.0-44.9, adult (HCC)   Other Visit Diagnoses       Encounter for immunization    -  Primary   Relevant Orders   Flu vaccine trivalent PF, 6mos and older(Flulaval,Afluria,Fluarix,Fluzone) (Completed)     Colon cancer screening       Relevant Orders   Ambulatory referral to Gastroenterology       Return in about 6 months (around 03/06/2025) for HTN, MDD, T2DM.     Damien Lease, DO

## 2024-09-06 NOTE — Assessment & Plan Note (Signed)
 Patient presents with a history of T2DM with a prior A1c of 7.1 in August 2025.   They are on a regimen of Trulicity  3 mg,Jardiance  25 mg, Metformin  1,500 mg, glimepiride  1mg  BID, Tresiba  25 units, and Novolog  8 units BID .  Patient reports one possible episode of hypoglycemia about a few months ago. Per patient,  average fasting blood glucose is 120-160.  Of note patient follows with endocrinology and is actually supposed to be on NovoLog  5 units with meals per prior note, patient was unaware and was notified to only use 5 units of NovoLog  with meals. Plan: -Continue regimen of:Trulicity  3 mg,Jardiance  25 mg, Metformin  1,500 mg, glimepiride  1mg  BID, Tresiba  25 units, and Novolog  5 units with meals -Ophthalmology referral placed in June 2025.

## 2024-09-06 NOTE — Assessment & Plan Note (Signed)
 Patient presents with a history of hypertension with a blood pressure today of 131/86. Their hypertension is controlled on a regimen of amlodipine  5mg ,metoprolol  25 mg BID, and Losartan  50 mg.  Prior BMP in February 2025, Scr was 1.10.  Patient denies symptoms of hypotension at home.   Plan: -Continue current regimen of:  amlodipine  5mg ,metoprolol  25 mg BID, and Losartan  50 mg -BMP today

## 2024-09-07 LAB — LIPID PANEL
Chol/HDL Ratio: 3 ratio (ref 0.0–5.0)
Cholesterol, Total: 121 mg/dL (ref 100–199)
HDL: 40 mg/dL (ref >39)
LDL Chol Calc (NIH): 49 mg/dL (ref 0–99)
Triglycerides: 198 mg/dL — ABNORMAL HIGH (ref 0–149)
VLDL Cholesterol Cal: 32 mg/dL (ref 5–40)

## 2024-09-07 LAB — BASIC METABOLIC PANEL WITH GFR
BUN/Creatinine Ratio: 14 (ref 10–24)
BUN: 15 mg/dL (ref 8–27)
CO2: 26 mmol/L (ref 20–29)
Calcium: 9.9 mg/dL (ref 8.6–10.2)
Chloride: 100 mmol/L (ref 96–106)
Creatinine, Ser: 1.11 mg/dL (ref 0.76–1.27)
Glucose: 101 mg/dL — ABNORMAL HIGH (ref 70–99)
Potassium: 4.7 mmol/L (ref 3.5–5.2)
Sodium: 141 mmol/L (ref 134–144)
eGFR: 75 mL/min/1.73 (ref >59)

## 2024-09-11 ENCOUNTER — Ambulatory Visit: Payer: Self-pay | Admitting: Student

## 2024-09-15 NOTE — Progress Notes (Signed)
 Internal Medicine Clinic Attending  Case discussed with the resident at the time of the visit.  We reviewed the resident's history and exam and pertinent patient test results.  I agree with the assessment, diagnosis, and plan of care documented in the resident's note.

## 2024-09-22 ENCOUNTER — Other Ambulatory Visit: Payer: Self-pay | Admitting: Student

## 2024-09-22 DIAGNOSIS — I10 Essential (primary) hypertension: Secondary | ICD-10-CM

## 2024-09-22 NOTE — Telephone Encounter (Signed)
 Medication sent to pharmacy

## 2024-10-02 ENCOUNTER — Other Ambulatory Visit: Payer: Self-pay | Admitting: Endocrinology

## 2024-10-02 DIAGNOSIS — E1142 Type 2 diabetes mellitus with diabetic polyneuropathy: Secondary | ICD-10-CM

## 2024-10-10 ENCOUNTER — Ambulatory Visit: Payer: Self-pay | Admitting: Endocrinology

## 2024-10-10 DIAGNOSIS — E119 Type 2 diabetes mellitus without complications: Secondary | ICD-10-CM | POA: Diagnosis not present

## 2024-10-10 DIAGNOSIS — H52203 Unspecified astigmatism, bilateral: Secondary | ICD-10-CM | POA: Diagnosis not present

## 2024-10-10 DIAGNOSIS — H31002 Unspecified chorioretinal scars, left eye: Secondary | ICD-10-CM | POA: Diagnosis not present

## 2024-10-10 DIAGNOSIS — H5213 Myopia, bilateral: Secondary | ICD-10-CM | POA: Diagnosis not present

## 2024-10-10 LAB — OPHTHALMOLOGY REPORT-SCANNED

## 2024-10-23 ENCOUNTER — Encounter: Payer: Self-pay | Admitting: Radiology

## 2024-11-15 ENCOUNTER — Ambulatory Visit: Payer: Self-pay

## 2024-11-15 ENCOUNTER — Encounter (HOSPITAL_COMMUNITY): Payer: Self-pay | Admitting: Emergency Medicine

## 2024-11-15 ENCOUNTER — Emergency Department (HOSPITAL_COMMUNITY)
Admission: EM | Admit: 2024-11-15 | Discharge: 2024-11-15 | Disposition: A | Discharge status: Home / Self Care | Attending: Student in an Organized Health Care Education/Training Program | Admitting: Student in an Organized Health Care Education/Training Program

## 2024-11-15 ENCOUNTER — Other Ambulatory Visit: Payer: Self-pay

## 2024-11-15 DIAGNOSIS — Z794 Long term (current) use of insulin: Secondary | ICD-10-CM | POA: Insufficient documentation

## 2024-11-15 DIAGNOSIS — H1033 Unspecified acute conjunctivitis, bilateral: Secondary | ICD-10-CM | POA: Insufficient documentation

## 2024-11-15 MED ORDER — TOBRAMYCIN 0.3 % OP SOLN: 1.0000 [drp] | OPHTHALMIC | 0 refills | Status: AC

## 2024-11-15 NOTE — ED Notes (Signed)
 Patient discharged by RN. Patient verbalizes understanding of instructions without additional questions. Ambulatory at time of discharge.

## 2024-11-15 NOTE — ED Triage Notes (Signed)
 Pt reports bilateral eye redness and pain since Monday. Endorses blurred vision.

## 2024-11-15 NOTE — Telephone Encounter (Signed)
 FYI Only or Action Required?: FYI only for provider: going to urgent care or the ED.  Patient was last seen in primary care on 09/06/2024 by Kandis Perkins, DO.  Called Nurse Triage reporting Eye Problem.  Symptoms began several days ago.  Interventions attempted: Nothing.  Symptoms are: gradually worsening.  Triage Disposition: See Physician Within 24 Hours  Patient/caregiver understands and will follow disposition?: No appointment, going to urgent care or the ED    Copied from CRM #[id number]. Topic: Clinical - Red Word Triage >> Nov 15, 2024 11:56 AM DeAngela L wrote: Red Word that prompted transfer to Nurse Triage: the patient states for 2 days his eyes has been blurred vision and itching and swollen and today this seems to have gotten worse  Eyes kind of closed from the swelling and there is some kind of discharge is not clear coming from both eyes   Pt num [phone] (H)      Reason for Disposition  [1] SEVERE eyelid swelling (i.e., shut or almost) AND [2] involves both eyes AND [3] itchy  Answer Assessment - Initial Assessment Questions Patient states he will go to urgent care or the ED    1. ONSET: When did the swelling start? (e.g., minutes, hours, days)     2 days ago  2. LOCATION: What part of the eyelids is swollen?     Bilateral eyes  3. SEVERITY: How swollen is it?     Mild to moderate  4. ITCHING: Is there any itching? If Yes, ask: How much?   (Scale 1-10; mild, moderate or severe)     Yes 5. PAIN: Is the swelling painful to touch? If Yes, ask: How painful is it?   (Scale 1-10; mild, moderate or severe)     Mild soreness  6. FEVER: Do you have a fever? If Yes, ask: What is it, how was it measured, and when did it start?      No 7. CAUSE: What do you think is causing the swelling?     Unsure  8. RECURRENT SYMPTOM: Have you had eyelid swelling before? If Yes, ask: When was the last time? What happened that time?     No 9. OTHER  SYMPTOMS: Do you have any other symptoms? (e.g., blurred vision, eye discharge, rash, runny nose)     Eye discharge, blurred vision  Protocols used: Eye - Swelling-A-AH

## 2024-11-15 NOTE — ED Provider Notes (Signed)
 [place] EMERGENCY DEPARTMENT AT [facility] Provider Note   CSN: [id number] Arrival date & time: 11/15/24  1245     Patient presents with: Conjunctivitis   Jack Wallace is a 63 y.o. male.   Patient reports he began developing irritation to both of his eyes after going to the [place] on Monday.  Patient states his eyes have been red he has had itching and crusting to his eyes.  Patient denies any fever or chills he has not had any cough or congestion.  Patient denies any runny nose.  Patient reports that he saw the ophthalmologist 3 weeks ago and had a normal eye exam.  Patient is diabetic but his ophthalmologist told him he did not  have any signs of diabetic eye disease.  Patient did have his pressures checked and they were normal.  The history is provided by the patient. No language interpreter was used.  Conjunctivitis       Prior to Admission medications   Medication Sig Start Date End Date Taking? Authorizing Provider  tobramycin  (TOBREX ) 0.3 % ophthalmic solution Place 1 drop into the right eye every 4 (four) hours for 10 days. 11/15/24 11/25/24 Yes Karinne Schmader K, PA-C  amLODipine  (NORVASC ) 5 MG tablet TAKE 1 TABLET(5 MG) BY MOUTH DAILY 09/22/24   Renne Homans, MD  BAYER MICROLET LANCETS lancets Use to check blood sugar two times per day 12/15/16   Von [place], MD  Blood Glucose Monitoring Suppl (CONTOUR NEXT EZ MONITOR) w/Device KIT Use to check blood sugar two times per day 12/15/16   [place]on [place], MD  Continuous Glucose Receiver (FREESTYLE LIBRE 3 READER) DEVI Please use to check your glucose levels daily 04/17/24   Kandis Perkins, DO  Continuous Glucose Sensor (FREESTYLE LIBRE 3 PLUS SENSOR) MISC Change sensor every 15 days. Use to check glucose continuously 04/20/24   Elicia Sharper, DO  Dulaglutide  (TRULICITY ) 3 MG/0.5ML SOAJ Inject 3 mg as directed once a week. 04/17/24   Kandis Perkins, DO  empagliflozin  (JARDIANCE ) 25 MG TABS tablet Take 1 tablet (25 mg  total) by mouth daily. 10/29/23   Thapa, Sudan, MD  gabapentin  (NEURONTIN ) 600 MG tablet TAKE 1 TABLET EVERY MORNING AND 2 AT BEDTIME 10/02/24   Thapa, Sudan, MD  glimepiride  (AMARYL ) 1 MG tablet Take 1 tablet (1 mg total) by mouth 2 (two) times daily. 07/05/24   Thapa, Sudan, MD  glucose blood (CONTOUR NEXT TEST) test strip TEST 3-4 timesDAILY 02/02/24   Thapa, Sudan, MD  insulin  aspart (NOVOLOG  FLEXPEN) 100 UNIT/ML FlexPen Inject 5 Units into the skin 3 (three) times daily with meals. 08/15/24   Thapa, Sudan, MD  insulin  degludec (TRESIBA  FLEXTOUCH) 100 UNIT/ML FlexTouch Pen Inject 25 Units into the skin daily. 04/17/24   Kandis Perkins, DO  Insulin  Pen Needle 32G X 5 MM MISC 1 Needle by Does not apply route daily. Use 4 x  with Insulin  Pen 02/02/24   Thapa, Sudan, MD  losartan  (COZAAR ) 50 MG tablet TAKE 1 TABLET(50 MG) BY MOUTH DAILY 09/22/24   Amoako, Prince, MD  Magnesium  Oxide 400 MG CAPS Take 1 capsule (400 mg total) by mouth daily. 12/24/22   Norrine Sharper, MD  metFORMIN  (GLUCOPHAGE -XR) 500 MG 24 hr tablet Take 3 tablets (1,500 mg total) by mouth daily with breakfast. 02/02/24   Thapa, Sudan, MD  metoprolol  tartrate (LOPRESSOR ) 25 MG tablet Take 1 tablet (25 mg total) by mouth 2 (two) times daily. 01/13/24   Patwardhan, Newman PARAS, MD  Multiple Vitamin (  MULTIVITAMIN WITH MINERALS) TABS tablet Take 1 tablet by mouth daily.    [provider]  pantoprazole  (PROTONIX ) 40 MG tablet TAKE 1 TABLET(40 MG) BY MOUTH TWICE DAILY 01/04/24   Amoako, Prince, MD  rosuvastatin  (CRESTOR ) 20 MG tablet Take 1 tablet (20 mg total) by mouth daily. 01/13/24   Patwardhan, Newman PARAS, MD  sertraline  (ZOLOFT ) 100 MG tablet TAKE 2 TABLETS(200 MG) BY MOUTH DAILY 06/05/24   Amoako, Prince, MD  vitamin B-12 (CYANOCOBALAMIN ) 500 MCG tablet Take 500 mcg by mouth daily.    [provider]    Allergies: Mounjaro  [tirzepatide ]    Review of Systems  All other systems reviewed and are negative.   Updated Vital  Signs BP 138/79   Pulse 84   Temp 98.7 F (37.1 C) (Oral)   Resp 18   Ht 6' (1.829 m)   Wt (!) 148 kg   SpO2 93%   BMI 44.25 kg/m   Physical Exam Vitals and nursing note reviewed.  Constitutional:      Appearance: He is well-developed.  HENT:     Head: Normocephalic.     Nose: Nose normal.     Mouth/Throat:     Mouth: Mucous membranes are moist.  Eyes:     Extraocular Movements: Extraocular movements intact.     Pupils: Pupils are equal, round, and reactive to light.     Comments: Injected bilateral conjunctiva crusting corners of her eyes,  Cardiovascular:     Rate and Rhythm: Normal rate.  Pulmonary:     Effort: Pulmonary effort is normal.  Abdominal:     General: There is no distension.  Musculoskeletal:        General: Normal range of motion.     Cervical back: Normal range of motion.  Skin:    General: Skin is warm.  Neurological:     General: No focal deficit present.     Mental Status: He is alert and oriented to person, place, and time.     (all labs ordered are listed, but only abnormal results are displayed) Labs Reviewed - No data to display  EKG: None  Radiology: No results found.   Procedures   Medications Ordered in the ED - No data to display                                  Medical Decision Making Patient reports he began having redness and irritation to his eyes on Monday after going to the [place].  He does not believe that he got any type of chemical in his eye  Risk Prescription drug management. Risk Details: Patient complains of redness and swelling to both of his eyes.  He has not tried any medications he reports crusting in the AM.        Final diagnoses:  Acute conjunctivitis of both eyes, unspecified acute conjunctivitis type    ED Discharge Orders          Ordered    tobramycin  (TOBREX ) 0.3 % ophthalmic solution  Every 4 hours        11/15/24 1324            An After Visit Summary was printed and given  to the patient.    Flint Sonny POUR, PA-C 11/15/24 1328    Lowther, Amy, DO 11/17/24 1907

## 2024-11-15 NOTE — Discharge Instructions (Addendum)
 Return if any problems.  Return if any problems.  Try taking benadryl or zyrtec to see if this helps with the symptoms

## 2024-11-28 ENCOUNTER — Other Ambulatory Visit: Payer: Self-pay | Admitting: Endocrinology

## 2024-11-30 ENCOUNTER — Other Ambulatory Visit: Payer: Self-pay

## 2024-11-30 ENCOUNTER — Encounter: Payer: Self-pay | Admitting: Endocrinology

## 2024-11-30 DIAGNOSIS — E1142 Type 2 diabetes mellitus with diabetic polyneuropathy: Secondary | ICD-10-CM

## 2024-11-30 DIAGNOSIS — E1165 Type 2 diabetes mellitus with hyperglycemia: Secondary | ICD-10-CM

## 2024-11-30 MED ORDER — CONTOUR NEXT TEST VI STRP: ORAL_STRIP | 2 refills | Status: AC

## 2024-12-05 ENCOUNTER — Other Ambulatory Visit: Payer: Self-pay | Admitting: Endocrinology

## 2024-12-05 DIAGNOSIS — E1142 Type 2 diabetes mellitus with diabetic polyneuropathy: Secondary | ICD-10-CM

## 2024-12-19 ENCOUNTER — Other Ambulatory Visit: Payer: Self-pay | Admitting: *Deleted

## 2024-12-19 MED ORDER — TRULICITY 3 MG/0.5ML ~~LOC~~ SOAJ: 3.0000 mg | SUBCUTANEOUS | 2 refills | Status: AC

## 2024-12-22 ENCOUNTER — Ambulatory Visit: Payer: Self-pay | Admitting: Endocrinology

## 2024-12-22 ENCOUNTER — Ambulatory Visit: Admitting: Endocrinology

## 2024-12-22 ENCOUNTER — Other Ambulatory Visit: Payer: Self-pay

## 2024-12-22 ENCOUNTER — Encounter: Payer: Self-pay | Admitting: Endocrinology

## 2024-12-22 VITALS — BP 122/88 | HR 107 | Resp 16 | Ht 72.0 in | Wt 323.2 lb

## 2024-12-22 DIAGNOSIS — Z7984 Long term (current) use of oral hypoglycemic drugs: Secondary | ICD-10-CM | POA: Diagnosis not present

## 2024-12-22 DIAGNOSIS — E1142 Type 2 diabetes mellitus with diabetic polyneuropathy: Secondary | ICD-10-CM

## 2024-12-22 DIAGNOSIS — Z794 Long term (current) use of insulin: Secondary | ICD-10-CM | POA: Diagnosis not present

## 2024-12-22 DIAGNOSIS — E1165 Type 2 diabetes mellitus with hyperglycemia: Secondary | ICD-10-CM | POA: Diagnosis not present

## 2024-12-22 DIAGNOSIS — Z7985 Long-term (current) use of injectable non-insulin antidiabetic drugs: Secondary | ICD-10-CM

## 2024-12-22 LAB — POCT GLYCOSYLATED HEMOGLOBIN (HGB A1C): Hemoglobin A1C: 7.4 % — AB (ref 4.0–5.6)

## 2024-12-22 NOTE — Telephone Encounter (Signed)
 ERROR

## 2024-12-22 NOTE — Progress Notes (Signed)
 "  Outpatient Endocrinology Note Kadin Bera, MD  12/22/2024  Patient's Name: Jack Wallace    DOB: Aug 31, 1961    MRN: [id number]                                                    REASON OF VISIT: Follow up of type 2 diabetes mellitus  PCP: Renne Homans, MD  HISTORY OF PRESENT ILLNESS:   Jack Wallace is a 64 y.o. old male with past medical history listed below, is here for follow up for type 2 diabetes mellitus.  Patient was last seen by Dr. Von in July 2024.  Pertinent Diabetes History: Patient was previously and was last time seen by Dr. Von in July 2024.  Patient was diagnosed with type 2 diabetes mellitus in 1993.  Chronic Diabetes Complications : Retinopathy: no. Last ophthalmology exam was done on 09/2024, following with ophthalmology regularly.  Nephropathy: no, on ACE/ARB / losartan  Peripheral neuropathy: yes, on gabapentin  Coronary artery disease: no Stroke: no  Relevant comorbidities and cardiovascular risk factors: Obesity: yes Body mass index is 43.83 kg/m.  Hypertension: Yes  Hyperlipidemia : Yes, on statin   Current / Home Diabetic regimen includes: Tresiba  25 units daily at bedtime Metformin  extended release 1500 mg daily. Jardiance  25 mg daily.   Amaryl  1 mg two times a day. Novolog  5 units with meals 1-2 times a day, usually once a day. Trulicity  3 mg weekly.   Prior diabetic medications: Actos had used in the past stopped due to fear of long-term side effects.  He had used glipizide , Ozempic  and Victoza  in the past.  Glycemic data:   Patient is here controlled next glucometer download from December 19 to January 2 , 2026 reviewed, average blood sugar 122.  He has been checking mostly in the morning fasting and sometimes after breakfast.  Fasting blood sugar acceptable 114, 93, 105, 103, 97, 92, 107, 151, 182, 197.  CGM discussed in the past was not cost effective.  Hypoglycemia: Patient has no hypoglycemic episodes. Patient has hypoglycemia  awareness.  Factors modifying glucose control: 1.  Diabetic diet assessment: 3 meals a day.  2.  Staying active or exercising: Currently no exercise, due to left foot fracture.  3.  Medication compliance: compliant all of the time.  Interval history  Diabetes regimen as reviewed and noted above.  He mostly takes NovoLog  with largest meal usually once a day.  Hemoglobin A1c worsened to 7.4%.  Glucometer data mostly acceptable.  He has no other complaints today.  REVIEW OF SYSTEMS As per history of present illness.   PAST MEDICAL HISTORY: Past Medical History:  Diagnosis Date   Depression    Diabetes mellitus without complication (HCC)    GERD (gastroesophageal reflux disease)    Hyperlipidemia    Hypertension    Ischemic colitis    Tachycardia     PAST SURGICAL HISTORY: Past Surgical History:  Procedure Laterality Date   COLONOSCOPY  06/01/2014   NASAL SEPTUM SURGERY     ORIF TOE FRACTURE Left 11/12/2023   Procedure: OPEN REDUCTION INTERNAL FIXATION (ORIF) 5th METATARSAL (TOE) FRACTURE LEFT FOOT;  Surgeon: Harden Jerona GAILS, MD;  Location: [facility] OR;  Service: Orthopedics;  Laterality: Left;    ALLERGIES: Allergies  Allergen Reactions   Mounjaro  [Tirzepatide ] Diarrhea and Nausea Only    FAMILY HISTORY:  Family History  Problem Relation Age of Onset   Hypertension Mother    Stroke Father    Diabetes Maternal Grandfather    Colon cancer Neg Hx    Throat cancer Neg Hx    Prostate cancer Neg Hx    Pancreatic cancer Neg Hx    Heart disease Neg Hx    Kidney disease Neg Hx    Liver disease Neg Hx     SOCIAL HISTORY: Social History   Socioeconomic History   Marital status: Single    Spouse name: Not on file   Number of children: 0   Years of education: Not on file   Highest education level: Not on file  Occupational History   Occupation: Midwife  Tobacco Use   Smoking status: Never   Smokeless tobacco: Never  Vaping Use   Vaping status: Never Used  Substance  and Sexual Activity   Alcohol use: No   Drug use: No   Sexual activity: Not on file  Other Topics Concern   Not on file  Social History Narrative   Not on file   Social Drivers of Health   Tobacco Use: Low Risk (12/22/2024)   Patient History    Smoking Tobacco Use: Never    Smokeless Tobacco Use: Never    Passive Exposure: Not on file  Financial Resource Strain: Medium Risk (05/24/2024)   Overall Financial Resource Strain (CARDIA)    Difficulty of Paying Living Expenses: Somewhat hard  Food Insecurity: No Food Insecurity (05/24/2024)   Hunger Vital Sign    Worried About Running Out of Food in the Last Year: Never true    Ran Out of Food in the Last Year: Never true  Transportation Needs: No Transportation Needs (05/24/2024)   PRAPARE - Administrator, Civil Service (Medical): No    Lack of Transportation (Non-Medical): No  Physical Activity: Insufficiently Active (05/24/2024)   Exercise Vital Sign    Days of Exercise per Week: 2 days    Minutes of Exercise per Session: 10 min  Stress: No Stress Concern Present (05/24/2024)   [employer] of Occupational Health - Occupational Stress Questionnaire    Feeling of Stress : Only a little  Social Connections: Socially Isolated (05/24/2024)   Social Connection and Isolation Panel    Frequency of Communication with Friends and Family: Once a week    Frequency of Social Gatherings with Friends and Family: Once a week    Attends Religious Services: Never    Database Administrator or Organizations: No    Attends Banker Meetings: Never    Marital Status: Never married  Depression (PHQ2-9): Low Risk (09/06/2024)   Depression (PHQ2-9)    PHQ-2 Score: 1  Alcohol Screen: Low Risk (05/24/2024)   Alcohol Screen    Last Alcohol Screening Score (AUDIT): 0  Housing: Unknown (05/24/2024)   Housing Stability Vital Sign    Unable to Pay for Housing in the Last Year: No    Number of Times Moved in the Last Year: Not on file     Homeless in the Last Year: No  Utilities: Not At Risk (05/24/2024)   AHC Utilities    Threatened with loss of utilities: No  Health Literacy: Adequate Health Literacy (05/24/2024)   B1300 Health Literacy    Frequency of need for help with medical instructions: Never    MEDICATIONS:  Current Outpatient Medications  Medication Sig Dispense Refill   amLODipine  (NORVASC ) 5 MG tablet TAKE 1 TABLET(5  MG) BY MOUTH DAILY 90 tablet 3   BAYER MICROLET LANCETS lancets Use to check blood sugar two times per day 100 each 2   Blood Glucose Monitoring Suppl (CONTOUR NEXT EZ MONITOR) w/Device KIT Use to check blood sugar two times per day 1 kit 2   Dulaglutide  (TRULICITY ) 3 MG/0.5ML SOAJ Inject 3 mg as directed once a week. 3 mL 2   empagliflozin  (JARDIANCE ) 25 MG TABS tablet TAKE 1 TABLET(25 MG) BY MOUTH DAILY 90 tablet 3   gabapentin  (NEURONTIN ) 600 MG tablet TAKE 1 TABLET EVERY MORNING AND 2 AT BEDTIME 90 tablet 1   glimepiride  (AMARYL ) 1 MG tablet Take 1 tablet (1 mg total) by mouth 2 (two) times daily. 180 tablet 1   glucose blood (CONTOUR NEXT TEST) test strip TEST 3-4 timesDAILY 300 strip 2   insulin  aspart (NOVOLOG  FLEXPEN) 100 UNIT/ML FlexPen Inject 5 Units into the skin 3 (three) times daily with meals. 15 mL 11   insulin  degludec (TRESIBA  FLEXTOUCH) 100 UNIT/ML FlexTouch Pen Inject 25 Units into the skin daily. 15 mL 1   Insulin  Pen Needle 32G X 5 MM MISC 1 Needle by Does not apply route daily. Use 4 x  with Insulin  Pen 300 each 3   losartan  (COZAAR ) 50 MG tablet TAKE 1 TABLET(50 MG) BY MOUTH DAILY 90 tablet 3   Magnesium  Oxide 400 MG CAPS Take 1 capsule (400 mg total) by mouth daily. 30 capsule 0   metFORMIN  (GLUCOPHAGE -XR) 500 MG 24 hr tablet Take 3 tablets (1,500 mg total) by mouth daily with breakfast. 270 tablet 3   metoprolol  tartrate (LOPRESSOR ) 25 MG tablet Take 1 tablet (25 mg total) by mouth 2 (two) times daily. 180 tablet 3   Multiple Vitamin (MULTIVITAMIN WITH MINERALS) TABS tablet Take  1 tablet by mouth daily.     pantoprazole  (PROTONIX ) 40 MG tablet TAKE 1 TABLET(40 MG) BY MOUTH TWICE DAILY 180 tablet 3   rosuvastatin  (CRESTOR ) 20 MG tablet Take 1 tablet (20 mg total) by mouth daily. 90 tablet 3   sertraline  (ZOLOFT ) 100 MG tablet TAKE 2 TABLETS(200 MG) BY MOUTH DAILY 60 tablet 11   vitamin B-12 (CYANOCOBALAMIN ) 500 MCG tablet Take 500 mcg by mouth daily.     Continuous Glucose Receiver (FREESTYLE LIBRE 3 READER) DEVI Please use to check your glucose levels daily 1 each 3   Continuous Glucose Sensor (FREESTYLE LIBRE 3 PLUS SENSOR) MISC Change sensor every 15 days. Use to check glucose continuously 2 each 11   No current facility-administered medications for this visit.    PHYSICAL EXAM: Vitals:   12/22/24 0854  BP: 122/88  Pulse: (!) 107  Resp: 16  SpO2: 97%  Weight: (!) 323 lb 3.2 oz (146.6 kg)  Height: 6' (1.829 m)       Body mass index is 43.83 kg/m.  Wt Readings from Last 3 Encounters:  12/22/24 (!) 323 lb 3.2 oz (146.6 kg)  11/15/24 (!) 326 lb 4.5 oz (148 kg)  09/06/24 (!) 327 lb 12.8 oz (148.7 kg)    General: Well developed, well nourished male in no apparent distress.  HEENT: AT/[place], no external lesions.  Eyes: Conjunctiva clear and no icterus. Neck: Neck supple  Lungs: Respirations not labored Neurologic: Alert, oriented, normal speech Extremities / Skin: Dry.  Psychiatric: Does not appear depressed or anxious  Diabetic Foot Exam - Simple   No data filed   LABS Reviewed Lab Results  Component Value Date   HGBA1C 7.4 (A) 12/22/2024  HGBA1C 7.1 (A) 08/15/2024   HGBA1C 7.2 (A) 05/24/2024   Lab Results  Component Value Date   FRUCTOSAMINE 293 (H) 11/16/2022   FRUCTOSAMINE 315 (H) 03/17/2022   FRUCTOSAMINE 294 (H) 02/24/2018   Lab Results  Component Value Date   CHOL 121 09/06/2024   HDL 40 09/06/2024   LDLCALC 49 09/06/2024   LDLDIRECT 85.0 07/30/2022   TRIG 198 (H) 09/06/2024   CHOLHDL 3.0 09/06/2024   Lab Results  Component  Value Date   MICRALBCREAT NOTE 02/02/2024   MICRALBCREAT SEE NOTE 03/29/2015   Lab Results  Component Value Date   CREATININE 1.11 09/06/2024   Lab Results  Component Value Date   GFR 67.93 10/15/2023    ASSESSMENT / PLAN  1. Uncontrolled type 2 diabetes mellitus with hyperglycemia, with long-term current use of insulin  (HCC)   2. Type 2 diabetes mellitus with peripheral neuropathy (HCC)     Diabetes Mellitus type 2, complicated by diabetic neuropathy. - Diabetic status / severity: Uncontrolled, worsening.  Lab Results  Component Value Date   HGBA1C 7.4 (A) 12/22/2024    - Hemoglobin A1c goal : <6.5%  Discussed about using NovoLog  more often, especially with high carb meals.  Advised to take NovoLog  up to 8 units for heavier meal.   - Medications: See below.  I) continue Tresiba  25 units daily at bedtime. II) continue metformin  extended release 1500 mg daily. III) continue Jardiance  25 mg daily. IV) continue Amaryl  1 mg 2 times a day.. V) adjust novolog  5-8 units units with meals up to 3 times a day, take 10-15 minutes before eating.   VI) continue Trulicity  3 mg weekly.   He reports he has been receiving Trulicity  and other injectable medication for free from PCP office, ?  Financial patient assistance.  - Home glucose testing: In the morning daily fasting and at bedtime. - Discussed/ Gave Hypoglycemia treatment plan.  # Consult : not required at this time.   # Annual urine for microalbuminuria/ creatinine ratio, no microalbuminuria currently, continue ACE/ARB losartan .  Last  Lab Results  Component Value Date   MICRALBCREAT NOTE 02/02/2024    # Foot check nightly / neuropathy, continue gabapentin .  # Annual dilated diabetic eye exams.   - Diet: Make healthy diabetic food choices - Life style / activity / exercise: Discussed.  Not able to do exercise due to left foot fracture.  2. Blood pressure  -  BP Readings from Last 1 Encounters:  12/22/24 122/88     - Control is in target.  - No change in current plans.  3. Lipid status / Hyperlipidemia - Last  Lab Results  Component Value Date   LDLCALC 49 09/06/2024   - Continue rosuvastatin  20 mg daily.  Managed by cardiology.  LDL recently was 47 on Apr 20, 2024 at PCP office.  Diagnoses and all orders for this visit:  Uncontrolled type 2 diabetes mellitus with hyperglycemia, with long-term current use of insulin  (HCC)  Type 2 diabetes mellitus with peripheral neuropathy (HCC) -     POCT glycosylated hemoglobin (Hb A1C)    DISPOSITION Follow up in clinic in 4 months suggested.   All questions answered and patient verbalized understanding of the plan.  Fern Canova, MD [facility] Endocrinology [facility] [address] [place], [address] [place], [place] [address] Phone # [phone]  At least part of this note was generated using voice recognition software. Inadvertent word errors may have occurred, which were not recognized during the  proofreading process. "

## 2025-01-01 ENCOUNTER — Other Ambulatory Visit: Payer: Self-pay

## 2025-01-01 ENCOUNTER — Other Ambulatory Visit: Payer: Self-pay | Admitting: Cardiology

## 2025-01-01 ENCOUNTER — Other Ambulatory Visit: Payer: Self-pay | Admitting: Endocrinology

## 2025-01-01 DIAGNOSIS — K2100 Gastro-esophageal reflux disease with esophagitis, without bleeding: Secondary | ICD-10-CM

## 2025-01-01 DIAGNOSIS — R000 Tachycardia, unspecified: Secondary | ICD-10-CM

## 2025-01-01 DIAGNOSIS — E1165 Type 2 diabetes mellitus with hyperglycemia: Secondary | ICD-10-CM

## 2025-01-01 MED ORDER — PANTOPRAZOLE SODIUM 40 MG PO TBEC: DELAYED_RELEASE_TABLET | ORAL | 3 refills | Status: AC

## 2025-01-01 NOTE — Telephone Encounter (Signed)
 Medication sent to pharmacy

## 2025-01-10 ENCOUNTER — Other Ambulatory Visit: Payer: Self-pay | Admitting: Endocrinology

## 2025-01-10 ENCOUNTER — Other Ambulatory Visit: Payer: Self-pay | Admitting: Cardiology

## 2025-01-10 DIAGNOSIS — E782 Mixed hyperlipidemia: Secondary | ICD-10-CM

## 2025-01-10 DIAGNOSIS — Z794 Long term (current) use of insulin: Secondary | ICD-10-CM

## 2025-01-12 ENCOUNTER — Encounter: Payer: Self-pay | Admitting: Endocrinology

## 2025-01-12 ENCOUNTER — Other Ambulatory Visit: Payer: Self-pay

## 2025-01-12 DIAGNOSIS — E782 Mixed hyperlipidemia: Secondary | ICD-10-CM

## 2025-01-12 MED ORDER — ROSUVASTATIN CALCIUM 20 MG PO TABS: 20.0000 mg | ORAL_TABLET | Freq: Every day | ORAL | 3 refills | Status: AC

## 2025-03-26 ENCOUNTER — Ambulatory Visit: Payer: Self-pay | Admitting: Student

## 2025-04-04 ENCOUNTER — Ambulatory Visit: Admitting: Endocrinology
